# Patient Record
Sex: Female | Born: 1955 | Race: Black or African American | Hispanic: No | Marital: Single | Smoking: Former smoker
Health system: Southern US, Community
[De-identification: ages and names within clinical notes are randomized; demographics above are authoritative.]

## PROBLEM LIST (undated history)

## (undated) DIAGNOSIS — D649 Anemia, unspecified: Secondary | ICD-10-CM

## (undated) DIAGNOSIS — F419 Anxiety disorder, unspecified: Secondary | ICD-10-CM

## (undated) DIAGNOSIS — K7460 Unspecified cirrhosis of liver: Secondary | ICD-10-CM

## (undated) DIAGNOSIS — C801 Malignant (primary) neoplasm, unspecified: Secondary | ICD-10-CM

## (undated) DIAGNOSIS — N183 Chronic kidney disease, stage 3 unspecified: Secondary | ICD-10-CM

## (undated) DIAGNOSIS — C50919 Malignant neoplasm of unspecified site of unspecified female breast: Secondary | ICD-10-CM

## (undated) DIAGNOSIS — J189 Pneumonia, unspecified organism: Secondary | ICD-10-CM

## (undated) DIAGNOSIS — K219 Gastro-esophageal reflux disease without esophagitis: Secondary | ICD-10-CM

## (undated) DIAGNOSIS — R188 Other ascites: Secondary | ICD-10-CM

## (undated) DIAGNOSIS — R519 Headache, unspecified: Secondary | ICD-10-CM

## (undated) DIAGNOSIS — I10 Essential (primary) hypertension: Secondary | ICD-10-CM

## (undated) DIAGNOSIS — I959 Hypotension, unspecified: Secondary | ICD-10-CM

## (undated) DIAGNOSIS — R0600 Dyspnea, unspecified: Secondary | ICD-10-CM

## (undated) DIAGNOSIS — Z7901 Long term (current) use of anticoagulants: Secondary | ICD-10-CM

## (undated) DIAGNOSIS — K766 Portal hypertension: Secondary | ICD-10-CM

## (undated) HISTORY — PX: LYMPH NODE DISSECTION: SHX5087

## (undated) HISTORY — PX: BREAST SURGERY: SHX581

## (undated) HISTORY — PX: BREAST LUMPECTOMY: SHX2

## (undated) HISTORY — PX: ABDOMINAL HYSTERECTOMY: SHX81

---

## 2016-08-07 ENCOUNTER — Observation Stay (HOSPITAL_COMMUNITY)
Admission: EM | Admit: 2016-08-07 | Discharge: 2016-08-08 | Disposition: A | Discharge status: Home / Self Care | Payer: Self-pay | Attending: Internal Medicine | Admitting: Internal Medicine

## 2016-08-07 ENCOUNTER — Encounter (HOSPITAL_COMMUNITY): Payer: Self-pay

## 2016-08-07 DIAGNOSIS — E876 Hypokalemia: Secondary | ICD-10-CM

## 2016-08-07 DIAGNOSIS — K766 Portal hypertension: Secondary | ICD-10-CM

## 2016-08-07 DIAGNOSIS — N183 Chronic kidney disease, stage 3 unspecified: Secondary | ICD-10-CM

## 2016-08-07 DIAGNOSIS — I129 Hypertensive chronic kidney disease with stage 1 through stage 4 chronic kidney disease, or unspecified chronic kidney disease: Secondary | ICD-10-CM | POA: Insufficient documentation

## 2016-08-07 DIAGNOSIS — D631 Anemia in chronic kidney disease: Secondary | ICD-10-CM

## 2016-08-07 DIAGNOSIS — K219 Gastro-esophageal reflux disease without esophagitis: Secondary | ICD-10-CM

## 2016-08-07 DIAGNOSIS — Z79899 Other long term (current) drug therapy: Secondary | ICD-10-CM | POA: Insufficient documentation

## 2016-08-07 DIAGNOSIS — I10 Essential (primary) hypertension: Secondary | ICD-10-CM

## 2016-08-07 DIAGNOSIS — K7031 Alcoholic cirrhosis of liver with ascites: Principal | ICD-10-CM

## 2016-08-07 DIAGNOSIS — Z7901 Long term (current) use of anticoagulants: Secondary | ICD-10-CM | POA: Insufficient documentation

## 2016-08-07 DIAGNOSIS — D638 Anemia in other chronic diseases classified elsewhere: Secondary | ICD-10-CM | POA: Insufficient documentation

## 2016-08-07 DIAGNOSIS — R188 Other ascites: Secondary | ICD-10-CM | POA: Diagnosis present

## 2016-08-07 HISTORY — DX: Unspecified cirrhosis of liver: K74.60

## 2016-08-07 HISTORY — DX: Long term (current) use of anticoagulants: Z79.01

## 2016-08-07 HISTORY — DX: Hypotension, unspecified: I95.9

## 2016-08-07 HISTORY — DX: Malignant neoplasm of unspecified site of unspecified female breast: C50.919

## 2016-08-07 HISTORY — DX: Other ascites: R18.8

## 2016-08-07 HISTORY — DX: Malignant (primary) neoplasm, unspecified: C80.1

## 2016-08-07 HISTORY — DX: Essential (primary) hypertension: I10

## 2016-08-07 LAB — CBC WITH DIFFERENTIAL/PLATELET
Basophils Absolute: 0 10*3/uL (ref 0.0–0.1)
Basophils Relative: 1 %
Eosinophils Absolute: 0.3 10*3/uL (ref 0.0–0.7)
Eosinophils Relative: 6 %
HEMATOCRIT: 29.7 % — AB (ref 36.0–46.0)
Hemoglobin: 9.5 g/dL — ABNORMAL LOW (ref 12.0–15.0)
LYMPHS PCT: 35 %
Lymphs Abs: 1.6 10*3/uL (ref 0.7–4.0)
MCH: 24.1 pg — ABNORMAL LOW (ref 26.0–34.0)
MCHC: 32 g/dL (ref 30.0–36.0)
MCV: 75.4 fL — ABNORMAL LOW (ref 78.0–100.0)
MONO ABS: 0.4 10*3/uL (ref 0.1–1.0)
MONOS PCT: 8 %
NEUTROS ABS: 2.4 10*3/uL (ref 1.7–7.7)
Neutrophils Relative %: 50 %
Platelets: 159 10*3/uL (ref 150–400)
RBC: 3.94 MIL/uL (ref 3.87–5.11)
RDW: 15.6 % — AB (ref 11.5–15.5)
WBC: 4.7 10*3/uL (ref 4.0–10.5)

## 2016-08-07 LAB — COMPREHENSIVE METABOLIC PANEL
ALT: 17 U/L (ref 14–54)
AST: 26 U/L (ref 15–41)
Albumin: 2.9 g/dL — ABNORMAL LOW (ref 3.5–5.0)
Alkaline Phosphatase: 139 U/L — ABNORMAL HIGH (ref 38–126)
Anion gap: 7 (ref 5–15)
BUN: 36 mg/dL — ABNORMAL HIGH (ref 6–20)
CO2: 21 mmol/L — AB (ref 22–32)
Calcium: 8.6 mg/dL — ABNORMAL LOW (ref 8.9–10.3)
Chloride: 109 mmol/L (ref 101–111)
Creatinine, Ser: 2.07 mg/dL — ABNORMAL HIGH (ref 0.44–1.00)
GFR calc Af Amer: 29 mL/min — ABNORMAL LOW (ref >60)
GFR calc non Af Amer: 25 mL/min — ABNORMAL LOW (ref >60)
Glucose, Bld: 90 mg/dL (ref 65–99)
POTASSIUM: 3.4 mmol/L — AB (ref 3.5–5.1)
SODIUM: 137 mmol/L (ref 135–145)
Total Bilirubin: 0.3 mg/dL (ref 0.3–1.2)
Total Protein: 7 g/dL (ref 6.5–8.1)

## 2016-08-07 LAB — PROTIME-INR
INR: 1.09
Prothrombin Time: 14.1 seconds (ref 11.4–15.2)

## 2016-08-07 MED ORDER — POTASSIUM CHLORIDE CRYS ER 20 MEQ PO TBCR
40.0000 meq | EXTENDED_RELEASE_TABLET | Freq: Once | ORAL | Status: AC
  Administered 2016-08-07: 40 meq via ORAL
  Filled 2016-08-07: qty 2

## 2016-08-07 NOTE — ED Notes (Signed)
Pt ambulated to the restroom with out difficulty.

## 2016-08-07 NOTE — ED Triage Notes (Signed)
Patient states she has ascites from cirrhosis and needs to be drained. Patient is new to [place].

## 2016-08-07 NOTE — ED Provider Notes (Signed)
[facility] DEPT Provider Note   CSN: [id number] Arrival date & time: 08/07/16  1728     History   Chief Complaint Chief Complaint  Patient presents with  . abdominal swelling    HPI Katrinia Straker is a 61 y.o. female.  HPI Complains of abdominal swelling for the past 2 weeks. She states this abdominal swelling secondary to ascites. She admits to shortness of breath over the past 2-3 days, dyspnea is worse with exertion and improved with rest. No fever. Denies noncompliance with medications. No other associated symptoms. Past Medical History:  Diagnosis Date  . Ascites   . Cancer ([place])   . Cirrhosis ([place])   . Hypertension   . Hypotension     There are no active problems to display for this patient.   Past Surgical History:  Procedure Laterality Date  . ABDOMINAL HYSTERECTOMY    . BREAST SURGERY    . CESAREAN SECTION      OB History    No data available       Home Medications    Prior to Admission medications   Medication Sig Start Date End Date Taking? Authorizing Provider  amLODipine (NORVASC) 10 MG tablet Take 10 mg by mouth daily.   Yes Historical Provider, MD  apixaban (ELIQUIS) 5 MG TABS tablet Take 5 mg by mouth 2 (two) times daily.   Yes Historical Provider, MD  furosemide (LASIX) 40 MG tablet Take 40 mg by mouth daily.   Yes Historical Provider, MD  hydrALAZINE (APRESOLINE) 25 MG tablet Take 25 mg by mouth 2 (two) times daily.   Yes Historical Provider, MD  hydrOXYzine (ATARAX/VISTARIL) 25 MG tablet Take 25 mg by mouth 2 (two) times daily as needed for itching.   Yes Historical Provider, MD  magnesium oxide (MAG-OX) 400 MG tablet Take 400 mg by mouth daily.   Yes Historical Provider, MD  omeprazole (PRILOSEC) 40 MG capsule Take 40 mg by mouth daily.   Yes Historical Provider, MD  propranolol (INDERAL) 20 MG tablet Take 20 mg by mouth 3 (three) times daily.   Yes Historical Provider, MD  spironolactone (ALDACTONE) 25 MG tablet Take 25 mg by mouth  daily.   Yes Historical Provider, MD    Family History No family history on file.  Social History Social History  Substance Use Topics  . Smoking status: Never Smoker  . Smokeless tobacco: Never Used  . Alcohol use No     Comment: for alcohol abuse     Allergies   Compazine [prochlorperazine edisylate] and Meclizine   Review of Systems Review of Systems  Constitutional: Negative.   HENT: Negative.   Respiratory: Positive for shortness of breath.   Cardiovascular: Negative.   Gastrointestinal: Positive for abdominal distention.  Musculoskeletal: Negative.   Skin: Negative.   Neurological: Negative.   Psychiatric/Behavioral: Negative.   All other systems reviewed and are negative.    Physical Exam Updated Vital Signs BP (!) 157/100 (BP Location: Left Arm)   Pulse 91   Temp 98.5 F (36.9 C) (Oral)   Resp 18   Ht 5' (1.524 m)   Wt 140 lb (63.5 kg)   SpO2 100%   BMI 27.34 kg/m   Physical Exam  Constitutional: She appears well-developed and well-nourished. No distress.  HENT:  Head: Normocephalic and atraumatic.  Eyes: Conjunctivae are normal. Pupils are equal, round, and reactive to light.  Neck: Neck supple. No tracheal deviation present. No thyromegaly present.  Cardiovascular: Normal rate and regular rhythm.  No murmur heard. Pulmonary/Chest: Effort normal and breath sounds normal.  Abdominal: Soft. Bowel sounds are normal. She exhibits distension. There is no tenderness.  Protuberant abdomen  Musculoskeletal: Normal range of motion. She exhibits no edema or tenderness.  Neurological: She is alert. Coordination normal.  Skin: Skin is warm and dry. No rash noted.  Psychiatric: She has a normal mood and affect.  Nursing note and vitals reviewed.    ED Treatments / Results  Labs (all labs ordered are listed, but only abnormal results are displayed) Labs Reviewed  COMPREHENSIVE METABOLIC PANEL  CBC WITH DIFFERENTIAL/PLATELET  PROTIME-INR    EKG   EKG Interpretation None      Results for orders placed or performed during the hospital encounter of 08/07/16  Comprehensive metabolic panel  Result Value Ref Range   Sodium 137 135 - 145 mmol/L   Potassium 3.4 (L) 3.5 - 5.1 mmol/L   Chloride 109 101 - 111 mmol/L   CO2 21 (L) 22 - 32 mmol/L   Glucose, Bld 90 65 - 99 mg/dL   BUN 36 (H) 6 - 20 mg/dL   Creatinine, Ser 2.07 (H) 0.44 - 1.00 mg/dL   Calcium 8.6 (L) 8.9 - 10.3 mg/dL   Total Protein 7.0 6.5 - 8.1 g/dL   Albumin 2.9 (L) 3.5 - 5.0 g/dL   AST 26 15 - 41 U/L   ALT 17 14 - 54 U/L   Alkaline Phosphatase 139 (H) 38 - 126 U/L   Total Bilirubin 0.3 0.3 - 1.2 mg/dL   GFR calc non Af Amer 25 (L) >60 mL/min   GFR calc Af Amer 29 (L) >60 mL/min   Anion gap 7 5 - 15  CBC with Differential/Platelet  Result Value Ref Range   WBC 4.7 4.0 - 10.5 K/uL   RBC 3.94 3.87 - 5.11 MIL/uL   Hemoglobin 9.5 (L) 12.0 - 15.0 g/dL   HCT 29.7 (L) 36.0 - 46.0 %   MCV 75.4 (L) 78.0 - 100.0 fL   MCH 24.1 (L) 26.0 - 34.0 pg   MCHC 32.0 30.0 - 36.0 g/dL   RDW 15.6 (H) 11.5 - 15.5 %   Platelets 159 150 - 400 K/uL   Neutrophils Relative % 50 %   Neutro Abs 2.4 1.7 - 7.7 K/uL   Lymphocytes Relative 35 %   Lymphs Abs 1.6 0.7 - 4.0 K/uL   Monocytes Relative 8 %   Monocytes Absolute 0.4 0.1 - 1.0 K/uL   Eosinophils Relative 6 %   Eosinophils Absolute 0.3 0.0 - 0.7 K/uL   Basophils Relative 1 %   Basophils Absolute 0.0 0.0 - 0.1 K/uL  Protime-INR  Result Value Ref Range   Prothrombin Time 14.1 11.4 - 15.2 seconds   INR 1.09    No results found.  Radiology No results found.  Procedures Procedures (including critical care time)  Medications Ordered in ED Medications - No data to display   Initial Impression / Assessment and Plan / ED Course  I have reviewed the triage vital signs and the nursing notes.  Pertinent labs & imaging results that were available during my care of the patient were reviewed by me and considered in my medical  decision making (see chart for details).     Dr. Eulas Post consulted and will arrange for overnight stay. Patient likely needs therapeutic paracentesis Oral potassium supplementation ordered Final Clinical Impressions(s) / ED Diagnoses  Diagnosis #1 ascites due to alcoholic cirrhosis #2 renal insufficiency Final diagnoses:  None  #  3 hypokalemia #4 dyspnea New Prescriptions New Prescriptions   No medications on file     Orlie Dakin, MD 08/07/16 2124

## 2016-08-07 NOTE — H&P (Signed)
History and Physical    Alicia [name] CWC:[id number] DOB: 1955-07-24 DOA: 08/07/2016  PCP: No PCP Per Patient  Previously followed at [facility]  Patient coming from: Home  Chief Complaint: Requesting therapeutic paracentesis.  HPI: Alicia [name] is a 61 y.o. woman with a history of alcoholic cirrhosis (diagnosed in 2017), recurrent ascites requiring therapeutic paracentesis, HTN, CKD 3, and a remote history of breast cancer who presents to the ED for paracentesis.  She has marked abdominal distention, now causing DOE and abdominal pain.  No fever.  No chest pain.  No syncope.  Stools have been loose.  No LUTS.  Last paracentesis on April 14th.  She reports that the interval between taps is decreasing.  She typically gets albumin with her large volume paracentesis.  She had 8L removed the last time.  She stopped taking her Eliquis on Saturday.  She reports that she was started on Eliquis recently because she is "high risk" for DVT.  She has a bilateral lower extremity ultrasound report with her that states that both legs were negative for DVT when checked.  She has several medical records at the bedside for MD review.  She has reference labs from 2/20 and 3/30.  Baseline Hgb around 10.  Creatinine has been 1.7 - 2.2.  ED Course: Normal WBC count.  Hgb 9.5.  Platelet count 159.  Potassium 3.1.  BUN 36.  Creatinine 2.07.  INR 1.  Hospitalist asked to place in observation for therapeutic paracentesis in the AM.  Review of Systems: As per HPI otherwise 10 systems reviewed and negative.   Past Medical History:  Diagnosis Date  . Ascites   . Breast cancer ([place])   . Cancer ([place])   . Chronic anticoagulation   . Cirrhosis ([place])   . Hypertension   . Hypotension     Past Surgical History:  Procedure Laterality Date  . ABDOMINAL HYSTERECTOMY    . BREAST LUMPECTOMY    . BREAST SURGERY    . CESAREAN SECTION    . LYMPH NODE DISSECTION       reports that she  has never smoked. She has never used smokeless tobacco. She reports that she does not drink alcohol or use drugs.  Allergies  Allergen Reactions  . Compazine [Prochlorperazine Edisylate] Anxiety  . Meclizine Anxiety    Family History  Problem Relation Age of Onset  . Diabetes Mother   . Diabetes Father   . Diabetes Brother      Prior to Admission medications   Medication Sig Start Date End Date Taking? Authorizing Provider  amLODipine (NORVASC) 10 MG tablet Take 10 mg by mouth daily.   Yes Historical Provider, MD  apixaban (ELIQUIS) 5 MG TABS tablet Take 5 mg by mouth 2 (two) times daily.   Yes Historical Provider, MD  furosemide (LASIX) 40 MG tablet Take 40 mg by mouth daily.   Yes Historical Provider, MD  hydrALAZINE (APRESOLINE) 25 MG tablet Take 25 mg by mouth 2 (two) times daily.   Yes Historical Provider, MD  hydrOXYzine (ATARAX/VISTARIL) 25 MG tablet Take 25 mg by mouth 2 (two) times daily as needed for itching.   Yes Historical Provider, MD  magnesium oxide (MAG-OX) 400 MG tablet Take 400 mg by mouth daily.   Yes Historical Provider, MD  omeprazole (PRILOSEC) 40 MG capsule Take 40 mg by mouth daily.   Yes Historical Provider, MD  propranolol (INDERAL) 20 MG tablet Take 20 mg by mouth 3 (three) times daily.  Yes Historical Provider, MD  spironolactone (ALDACTONE) 25 MG tablet Take 25 mg by mouth daily.   Yes Historical Provider, MD    Physical Exam: Vitals:   08/07/16 1744 08/07/16 1754 08/07/16 2113  BP: (!) 157/100  (!) 137/96  Pulse: 91  89  Resp: 18  18  Temp: 98.5 F (36.9 C)  97 F (36.1 C)  TempSrc: Oral  Oral  SpO2: 100%  100%  Weight:  63.5 kg (140 lb)   Height:  5' (1.524 m)       Constitutional: NAD, calm, comfortable, NONtoxic appearing Vitals:   08/07/16 1744 08/07/16 1754 08/07/16 2113  BP: (!) 157/100  (!) 137/96  Pulse: 91  89  Resp: 18  18  Temp: 98.5 F (36.9 C)  97 F (36.1 C)  TempSrc: Oral  Oral  SpO2: 100%  100%  Weight:  63.5 kg  (140 lb)   Height:  5' (1.524 m)    Eyes: PERRL, lids and conjunctivae normal ENMT: Mucous membranes are moist. Posterior pharynx clear of any exudate or lesions. Normal dentition.  Neck: normal appearance, supple, no masses Respiratory: clear to auscultation bilaterally, no wheezing, no crackles. Normal respiratory effort. No accessory muscle use.  Cardiovascular: Normal rate, regular rhythm, no murmurs / rubs / gallops. No extremity edema. 2+ pedal pulses. No carotid bruits.  GI: abdomen is profoundly distended.  No tenderness.  Hypoactive bowel sounds.  + ventral hernia.   Musculoskeletal:  No joint deformity in upper and lower extremities. Good ROM, no contractures. Normal muscle tone.  Skin: no rashes, warm and dry Neurologic: CN 2-12 grossly intact. Sensation intact, Strength symmetric bilaterally. Psychiatric: Normal judgment and insight. Alert and oriented x 3. Normal mood.     Labs on Admission: I have personally reviewed following labs and imaging studies  CBC:  Recent Labs Lab 08/07/16 1958  WBC 4.7  NEUTROABS 2.4  HGB 9.5*  HCT 29.7*  MCV 75.4*  PLT 956   Basic Metabolic Panel:  Recent Labs Lab 08/07/16 1958  NA 137  K 3.4*  CL 109  CO2 21*  GLUCOSE 90  BUN 36*  CREATININE 2.07*  CALCIUM 8.6*   GFR: Estimated Creatinine Clearance: 24.3 mL/min (A) (by C-G formula based on SCr of 2.07 mg/dL (H)). Liver Function Tests:  Recent Labs Lab 08/07/16 1958  AST 26  ALT 17  ALKPHOS 139*  BILITOT 0.3  PROT 7.0  ALBUMIN 2.9*   Coagulation Profile:  Recent Labs Lab 08/07/16 1958  INR 1.09     Assessment/Plan Principal Problem:   Ascites Active Problems:   Alcoholic cirrhosis of liver with ascites (HCC)   HTN (hypertension)   Anemia in other chronic diseases classified elsewhere   CKD (chronic kidney disease) stage 3, GFR 30-59 ml/min   Hypokalemia      Alcoholic cirrhosis with recurrent ascites --Will need US guided paracentesis with IR  in the AM --Will need albumin infusion ordered in the AM --Will need labs for peritoneal fluid ordered, if needed.  No signs of symptoms of SBP.  She is not encephalopathic or acutely decompensating --Holding Eliquis since Saturday. --Continue lasix, spironolactone, propranolol  HTN --Amlodipine, hydralazine  CKD --Appears stable  Hypokalemia --Replacement ordered in the ED  Social --Will need case manager assistance with discharge planning.  Needs referral to local physicians.  She will have an ongoing need for therapeutic paracentesis. --She says that she is applying for disability.   DVT prophylaxis: Low risk, outpatient status. Code Status: FULL  Family Communication: Patient alone in the ED at time of admission. Disposition Plan: Expect she will discharge home tomorrow. Consults called: NONE Admission status: Place in observation, med surg bed.   TIME SPENT: 60 minutes   Eber Jones MD [facility] Pager [phone]  If 7PM-7AM, please contact night-coverage [email or web address] Password [email or web address]  08/07/2016, 11:46 PM

## 2016-08-08 ENCOUNTER — Observation Stay (HOSPITAL_COMMUNITY): Payer: Self-pay

## 2016-08-08 ENCOUNTER — Encounter (HOSPITAL_COMMUNITY): Payer: Self-pay | Admitting: Internal Medicine

## 2016-08-08 DIAGNOSIS — K766 Portal hypertension: Secondary | ICD-10-CM

## 2016-08-08 DIAGNOSIS — I10 Essential (primary) hypertension: Secondary | ICD-10-CM

## 2016-08-08 DIAGNOSIS — N183 Chronic kidney disease, stage 3 unspecified: Secondary | ICD-10-CM

## 2016-08-08 DIAGNOSIS — K7031 Alcoholic cirrhosis of liver with ascites: Secondary | ICD-10-CM

## 2016-08-08 DIAGNOSIS — K219 Gastro-esophageal reflux disease without esophagitis: Secondary | ICD-10-CM

## 2016-08-08 DIAGNOSIS — E876 Hypokalemia: Secondary | ICD-10-CM

## 2016-08-08 DIAGNOSIS — D631 Anemia in chronic kidney disease: Secondary | ICD-10-CM

## 2016-08-08 DIAGNOSIS — D638 Anemia in other chronic diseases classified elsewhere: Secondary | ICD-10-CM

## 2016-08-08 MED ORDER — OMEPRAZOLE 40 MG PO CPDR: 40.0000 mg | DELAYED_RELEASE_CAPSULE | Freq: Every day | ORAL | 1 refills | Status: DC

## 2016-08-08 MED ORDER — FUROSEMIDE 40 MG PO TABS: 40.0000 mg | ORAL_TABLET | Freq: Every day | ORAL | 1 refills | Status: DC

## 2016-08-08 MED ORDER — HYDRALAZINE HCL 25 MG PO TABS
25.0000 mg | ORAL_TABLET | Freq: Two times a day (BID) | ORAL | Status: DC
  Administered 2016-08-08 (×2): 25 mg via ORAL
  Filled 2016-08-08 (×2): qty 1

## 2016-08-08 MED ORDER — FUROSEMIDE 40 MG PO TABS
40.0000 mg | ORAL_TABLET | Freq: Every day | ORAL | Status: DC
  Administered 2016-08-08: 40 mg via ORAL
  Filled 2016-08-08: qty 1
  Filled 2016-08-08: qty 2

## 2016-08-08 MED ORDER — AMLODIPINE BESYLATE 5 MG PO TABS
5.0000 mg | ORAL_TABLET | Freq: Every day | ORAL | Status: DC
  Administered 2016-08-08: 5 mg via ORAL
  Filled 2016-08-08: qty 1

## 2016-08-08 MED ORDER — ALBUMIN HUMAN 25 % IV SOLN
50.0000 g | INTRAVENOUS | Status: AC
  Administered 2016-08-08 (×2): 50 g via INTRAVENOUS
  Filled 2016-08-08 (×3): qty 200

## 2016-08-08 MED ORDER — HYDRALAZINE HCL 25 MG PO TABS: 25.0000 mg | ORAL_TABLET | Freq: Two times a day (BID) | ORAL | 1 refills | Status: DC

## 2016-08-08 MED ORDER — HYDROXYZINE HCL 25 MG PO TABS
25.0000 mg | ORAL_TABLET | Freq: Two times a day (BID) | ORAL | Status: DC | PRN
  Administered 2016-08-08 (×2): 25 mg via ORAL
  Filled 2016-08-08 (×2): qty 1

## 2016-08-08 MED ORDER — AMLODIPINE BESYLATE 5 MG PO TABS
5.0000 mg | ORAL_TABLET | Freq: Every day | ORAL | 1 refills | Status: DC
Start: 2016-08-09 — End: 2016-08-08

## 2016-08-08 MED ORDER — AMLODIPINE BESYLATE 5 MG PO TABS: 5.0000 mg | ORAL_TABLET | Freq: Every day | ORAL | 1 refills | Status: DC

## 2016-08-08 MED ORDER — SPIRONOLACTONE 25 MG PO TABS: 75.0000 mg | ORAL_TABLET | Freq: Every day | ORAL | 1 refills | Status: DC

## 2016-08-08 MED ORDER — ONDANSETRON HCL 4 MG/2ML IJ SOLN: 4.0000 mg | Freq: Four times a day (QID) | INTRAMUSCULAR | Status: DC | PRN

## 2016-08-08 MED ORDER — MAGNESIUM OXIDE 400 (241.3 MG) MG PO TABS
400.0000 mg | ORAL_TABLET | Freq: Every day | ORAL | Status: DC
Start: 2016-08-08 — End: 2016-08-09
  Administered 2016-08-08: 400 mg via ORAL
  Filled 2016-08-08: qty 1

## 2016-08-08 MED ORDER — SPIRONOLACTONE 25 MG PO TABS
75.0000 mg | ORAL_TABLET | Freq: Every day | ORAL | Status: DC
  Administered 2016-08-08: 75 mg via ORAL
  Filled 2016-08-08: qty 3

## 2016-08-08 MED ORDER — AMLODIPINE BESYLATE 5 MG PO TABS: 10.0000 mg | ORAL_TABLET | Freq: Every day | ORAL | Status: DC

## 2016-08-08 MED ORDER — SPIRONOLACTONE 25 MG PO TABS: 25.0000 mg | ORAL_TABLET | Freq: Every day | ORAL | Status: DC

## 2016-08-08 MED ORDER — PROPRANOLOL HCL 20 MG PO TABS
20.0000 mg | ORAL_TABLET | Freq: Three times a day (TID) | ORAL | Status: DC
  Administered 2016-08-08 (×2): 20 mg via ORAL
  Filled 2016-08-08 (×3): qty 1

## 2016-08-08 MED ORDER — ONDANSETRON HCL 4 MG PO TABS: 4.0000 mg | ORAL_TABLET | Freq: Four times a day (QID) | ORAL | Status: DC | PRN

## 2016-08-08 MED ORDER — TRAMADOL HCL 50 MG PO TABS
50.0000 mg | ORAL_TABLET | Freq: Three times a day (TID) | ORAL | Status: DC | PRN
  Administered 2016-08-08: 50 mg via ORAL
  Filled 2016-08-08: qty 1

## 2016-08-08 MED ORDER — PANTOPRAZOLE SODIUM 40 MG PO TBEC
40.0000 mg | DELAYED_RELEASE_TABLET | Freq: Every day | ORAL | Status: DC
  Administered 2016-08-08: 40 mg via ORAL
  Filled 2016-08-08: qty 1

## 2016-08-08 NOTE — Care Management Note (Signed)
Case Management Note  Patient Details  Name: Masey Scheiber MRN: [id number] Date of Birth: 12/16/1955  Subjective/Objective: 61 y/o f admitted w/ascites. From home. CM referral-Provided patient w/pcp listing-encouraged [facility]-patient will call on own for pcp appt,community resources-$4 [facility] med list, health insurance resource. Patient voiced understanding.                   Action/Plan:d/c plan home.   Expected Discharge Date:                  Expected Discharge Plan:  Home/Self Care  In-House Referral:     Discharge planning Services  CM Consult  Post Acute Care Choice:    Choice offered to:     DME Arranged:    DME Agency:     HH Arranged:    HH Agency:     Status of Service:  In process, will continue to follow  If discussed at Long Length of Stay Meetings, dates discussed:    Additional Comments:  Dessa Phi, RN 08/08/2016, 11:19 AM

## 2016-08-08 NOTE — Progress Notes (Signed)
08/08/16  1900  Reviewed discharge instructions with patient. Patient verbalized understanding of discharge instructions. Copy of discharge instructions and prescriptions given to patient.

## 2016-08-08 NOTE — Discharge Summary (Signed)
Physician Discharge Summary  Alicia [name] XBM:[id number] DOB: [birth year] DOA: 08/07/2016  PCP: No PCP Per Patient  Admit date: 08/07/2016 Discharge date: 08/08/2016  Time spent: 35 minutes  Recommendations for Outpatient Follow-up:  1. Repeat CBC to follow Hgb trend 2. Repeat BMET to follow electrolytes and renal function  3. Follow BP and adjust antihypertensive regimen as needed    Discharge Diagnoses:  Principal Problem:   Ascites Active Problems:   Alcoholic cirrhosis of liver with ascites (HCC)   HTN (hypertension)   Anemia in other chronic diseases classified elsewhere   CKD (chronic kidney disease) stage 3, GFR 30-59 ml/min   Hypokalemia   Gastroesophageal reflux disease   Portal hypertension ([place])   Discharge Condition: stable and improved. CM has arranged follow up with transition clinic and [place] office for Gastroenterology will set up outpatient appointment for her.  Diet recommendation: low sodium diet   Filed Weights   08/07/16 1754 08/08/16 0158  Weight: 63.5 kg (140 lb) 68 kg (150 lb)    History of present illness:  As per H&P written by Dr. Eulas Post on 08/07/16 61 y.o. woman with a history of alcoholic cirrhosis (diagnosed in 2017), recurrent ascites requiring therapeutic paracentesis, HTN, CKD 3, and a remote history of breast cancer who presents to the ED for paracentesis.  She has marked abdominal distention, now causing DOE and abdominal pain.  No fever.  No chest pain.  No syncope.  Stools have been loose.  No LUTS.  Last paracentesis on April 14th.  She reports that the interval between taps is decreasing.  She typically gets albumin with her large volume paracentesis.  She had 8L removed the last time.  She stopped taking her Eliquis on Saturday.  She reports that she was started on Eliquis recently because she is "high risk" for DVT.  She has a bilateral lower extremity ultrasound report with her that states that both legs were negative for DVT when  checked.  She has several medical records at the bedside for MD review.  She has reference labs from 2/20 and 3/30.  Baseline Hgb around 10.  Creatinine has been 1.7 - 2.2.  Hospital Course:  1-alcoholic cirrhosis with recurrent ascites -s/p US guided paracentesis, completely therapeutic in intention, 7.9 L removed. -no complications appreciated,  -no fever, no abd pain and because of that not fluid analysis to r/o SBP was pursuit. -patient advise to follow low sodium diet  -will discharge on lasix, spironolactone (dose adjusted) and propanolol -will also continue PPI   2-essential HTN/portal HTN -will continue amlodipine, hydralazine, propanolol, lasix and spironolactone  -advise to follow low sodium diet   3-CKD: stage 3 at baseline -Cr stable -will recommend BMET at follow up to assess electrolytes and renal function   4-Hypokalemia -repleted  -will continue magnesium supplementation and her spironolactone dose has been adjusted   5-GERD -will continue PPI  6-chronic anemia  -No signs of acute bleeding  -Hgb has remained stable -will recommend CBC at follow up to monitor trend  Procedures:  Large volume paracentesis: 08/08/16 7.9 L removed, no complications.   Consultations:  GI (curbside, to set up outpatient follow up and received rec's about diuretic regimen)  Discharge Exam: Vitals:   08/08/16 1029 08/08/16 1400  BP: (!) 120/7 112/80  Pulse:  72  Resp:  18  Temp:  98.6 F (37 C)    General: afebrile, in no major distress. Patient denies CP and fever/chills. Patient with slight SOB and mild abdominal discomfort due  to abd distension. Cardiovascular: S1 and S2, no rubs, no gallops Respiratory: good air movement, no wheezing, no crackles. Abd: distended, with positive wave fluid, distant BS, no guarding. Extremities: no edema, no cyanosis , no clubbing   Discharge Instructions   Discharge Instructions    Diet - low sodium heart healthy    Complete by:   As directed    Discharge instructions    Complete by:  As directed    Take medications as prescribed  Please follow up with GI as instructed (office will contact you with appointment details) Be compliant with visit at [facility] for hospital follow up and establishing care with PCP Follow low sodium diet  Please maintain adequate hydration     Current Discharge Medication List    CONTINUE these medications which have CHANGED   Details  amLODipine (NORVASC) 5 MG tablet Take 1 tablet (5 mg total) by mouth daily. Qty: 30 tablet, Refills: 1    furosemide (LASIX) 40 MG tablet Take 1 tablet (40 mg total) by mouth daily. Qty: 30 tablet, Refills: 1    hydrALAZINE (APRESOLINE) 25 MG tablet Take 1 tablet (25 mg total) by mouth 2 (two) times daily. Qty: 60 tablet, Refills: 1    omeprazole (PRILOSEC) 40 MG capsule Take 1 capsule (40 mg total) by mouth daily. Qty: 30 capsule, Refills: 1    spironolactone (ALDACTONE) 25 MG tablet Take 3 tablets (75 mg total) by mouth daily. Qty: 90 tablet, Refills: 1      CONTINUE these medications which have NOT CHANGED   Details  hydrOXYzine (ATARAX/VISTARIL) 25 MG tablet Take 25 mg by mouth 2 (two) times daily as needed for itching.    magnesium oxide (MAG-OX) 400 MG tablet Take 400 mg by mouth daily.    propranolol (INDERAL) 20 MG tablet Take 20 mg by mouth 3 (three) times daily.      STOP taking these medications     apixaban (ELIQUIS) 5 MG TABS tablet        Allergies  Allergen Reactions  . Compazine [Prochlorperazine Edisylate] Anxiety  . Meclizine Anxiety   Follow-up Information    [facility], [place] Follow up.   Specialty:  Gastroenterology Why:  office will contcat you with appointment details. Contact information: [address] [place] [address] [phone]           The results of significant diagnostics from this hospitalization (including imaging, microbiology, ancillary and laboratory) are  listed below for reference.    Significant Diagnostic Studies: US Paracentesis  Result Date: 08/08/2016 INDICATION: Cirrhosis, recurrent ascites, chronic kidney disease, remote breast cancer. Request made for therapeutic paracentesis up to 8 liters. EXAM: ULTRASOUND GUIDED THERAPEUTIC PARACENTESIS MEDICATIONS: None. COMPLICATIONS: None immediate. PROCEDURE: Informed written consent was obtained from the patient after a discussion of the risks, benefits and alternatives to treatment. A timeout was performed prior to the initiation of the procedure. Initial ultrasound scanning demonstrates a large amount of ascites within the right lower abdominal quadrant. The right lower abdomen was prepped and draped in the usual sterile fashion. 1% lidocaine was used for local anesthesia. Following this, a Yueh catheter was introduced. An ultrasound image was saved for documentation purposes. The paracentesis was performed. The catheter was removed and a dressing was applied. The patient tolerated the procedure well without immediate post procedural complication. FINDINGS: A total of approximately 7.9 liters of yellow fluid was removed. IMPRESSION: Successful ultrasound-guided therapeutic paracentesis yielding 7.9 liters of peritoneal fluid. Read by:  Rowe Robert, PA-C Electronically Signed   By: Sandi Mariscal M.D.   On: 08/08/2016 11:46   Labs: Basic Metabolic Panel:  Recent Labs Lab 08/07/16 1958  NA 137  K 3.4*  CL 109  CO2 21*  GLUCOSE 90  BUN 36*  CREATININE 2.07*  CALCIUM 8.6*   Liver Function Tests:  Recent Labs Lab 08/07/16 1958  AST 26  ALT 17  ALKPHOS 139*  BILITOT 0.3  PROT 7.0  ALBUMIN 2.9*   CBC:  Recent Labs Lab 08/07/16 1958  WBC 4.7  NEUTROABS 2.4  HGB 9.5*  HCT 29.7*  MCV 75.4*  PLT 159    Signed:  Barton Dubois MD.  [facility] 08/08/2016, 4:25 PM

## 2016-08-08 NOTE — Progress Notes (Signed)
CSW consulted to assist with insurance / PCP. CSW is unable to assist with this request. RNCM has been consulted and may be able to assist with PCP. Financial counselor may be able to assist with lack of insurance.  CSW signing off.  Werner Lean LCSW [phone]

## 2016-08-08 NOTE — Procedures (Signed)
Ultrasound-guided  therapeutic paracentesis performed yielding 7.9 liters of yellow fluid. No immediate complications.

## 2016-08-16 ENCOUNTER — Ambulatory Visit: Payer: Self-pay | Admitting: Physician Assistant

## 2016-08-19 ENCOUNTER — Encounter (HOSPITAL_COMMUNITY): Payer: Self-pay | Admitting: Emergency Medicine

## 2016-08-19 DIAGNOSIS — Z7982 Long term (current) use of aspirin: Secondary | ICD-10-CM | POA: Insufficient documentation

## 2016-08-19 DIAGNOSIS — Z79899 Other long term (current) drug therapy: Secondary | ICD-10-CM | POA: Insufficient documentation

## 2016-08-19 DIAGNOSIS — N183 Chronic kidney disease, stage 3 (moderate): Secondary | ICD-10-CM | POA: Insufficient documentation

## 2016-08-19 DIAGNOSIS — K7031 Alcoholic cirrhosis of liver with ascites: Secondary | ICD-10-CM | POA: Insufficient documentation

## 2016-08-19 DIAGNOSIS — Z853 Personal history of malignant neoplasm of breast: Secondary | ICD-10-CM | POA: Insufficient documentation

## 2016-08-19 DIAGNOSIS — I129 Hypertensive chronic kidney disease with stage 1 through stage 4 chronic kidney disease, or unspecified chronic kidney disease: Secondary | ICD-10-CM | POA: Insufficient documentation

## 2016-08-19 DIAGNOSIS — M7731 Calcaneal spur, right foot: Secondary | ICD-10-CM | POA: Insufficient documentation

## 2016-08-19 NOTE — ED Triage Notes (Signed)
Patient has cirrhosis and states been having issues with ascites/fluid build up. Patient reports got fluid drained from abd on 5/3.  Patient recently moved from [place] and doesn't have PCP in area yet.  Patient also c/o swelling in right foot over the past couple days.

## 2016-08-20 ENCOUNTER — Emergency Department (HOSPITAL_COMMUNITY)
Admission: EM | Admit: 2016-08-20 | Discharge: 2016-08-20 | Disposition: A | Discharge status: Home / Self Care | Payer: Medicaid Other | Attending: Emergency Medicine | Admitting: Emergency Medicine

## 2016-08-20 ENCOUNTER — Encounter: Payer: Self-pay | Admitting: Physician Assistant

## 2016-08-20 DIAGNOSIS — M7731 Calcaneal spur, right foot: Secondary | ICD-10-CM

## 2016-08-20 DIAGNOSIS — K7031 Alcoholic cirrhosis of liver with ascites: Secondary | ICD-10-CM

## 2016-08-20 LAB — PROTIME-INR
INR: 0.99
PROTHROMBIN TIME: 13 s (ref 11.4–15.2)

## 2016-08-20 LAB — CBC WITH DIFFERENTIAL/PLATELET
BASOS PCT: 1 %
Basophils Absolute: 0 10*3/uL (ref 0.0–0.1)
EOS PCT: 6 %
Eosinophils Absolute: 0.3 10*3/uL (ref 0.0–0.7)
HEMATOCRIT: 32.9 % — AB (ref 36.0–46.0)
HEMOGLOBIN: 10.8 g/dL — AB (ref 12.0–15.0)
LYMPHS ABS: 1.7 10*3/uL (ref 0.7–4.0)
LYMPHS PCT: 31 %
MCH: 24.8 pg — AB (ref 26.0–34.0)
MCHC: 32.8 g/dL (ref 30.0–36.0)
MCV: 75.5 fL — ABNORMAL LOW (ref 78.0–100.0)
Monocytes Absolute: 0.4 10*3/uL (ref 0.1–1.0)
Monocytes Relative: 8 %
Neutro Abs: 3 10*3/uL (ref 1.7–7.7)
Neutrophils Relative %: 55 %
Platelets: 175 10*3/uL (ref 150–400)
RBC: 4.36 MIL/uL (ref 3.87–5.11)
RDW: 15.6 % — AB (ref 11.5–15.5)
WBC: 5.5 10*3/uL (ref 4.0–10.5)

## 2016-08-20 LAB — COMPREHENSIVE METABOLIC PANEL
ALBUMIN: 3.6 g/dL (ref 3.5–5.0)
ALT: 21 U/L (ref 14–54)
ANION GAP: 11 (ref 5–15)
AST: 35 U/L (ref 15–41)
Alkaline Phosphatase: 152 U/L — ABNORMAL HIGH (ref 38–126)
BILIRUBIN TOTAL: 0.6 mg/dL (ref 0.3–1.2)
BUN: 36 mg/dL — ABNORMAL HIGH (ref 6–20)
CALCIUM: 9.1 mg/dL (ref 8.9–10.3)
CO2: 21 mmol/L — ABNORMAL LOW (ref 22–32)
Chloride: 106 mmol/L (ref 101–111)
Creatinine, Ser: 2.07 mg/dL — ABNORMAL HIGH (ref 0.44–1.00)
GFR calc non Af Amer: 25 mL/min — ABNORMAL LOW (ref >60)
GFR, EST AFRICAN AMERICAN: 29 mL/min — AB (ref >60)
GLUCOSE: 100 mg/dL — AB (ref 65–99)
POTASSIUM: 3.7 mmol/L (ref 3.5–5.1)
Sodium: 138 mmol/L (ref 135–145)
TOTAL PROTEIN: 7.8 g/dL (ref 6.5–8.1)

## 2016-08-20 NOTE — ED Provider Notes (Signed)
[facility] DEPT Provider Note   CSN: [id number] Arrival date & time: 08/19/16  1846  By signing my name below, I, Alicia [name], attest that this documentation has been prepared under the direction and in the presence of Alicia Etienne, DO. Electronically Signed: [name], ED Scribe. 08/20/16. 2:50 AM.  History   Chief Complaint Chief Complaint  Patient presents with  . Ascites  . swelling in right foot   The history is provided by the patient. No language interpreter was used.   HPI Comments: Alicia [name] is a 61 y.o. female with PMHx of Ascites, who presents to the Emergency Department complaining of gradual worsening abdominal swelling onset 2 weeks ago. No treatments tried prior to arrival in the ED. Pt denies fever.  Pt has a second complaint of constant, moderate right foot pain in her heel onset yesterday. Pt states her pain is exacerbated by direct pressure and ambulation. No treatments were tried prior to arrival in the ED. No other complaints at this time.   Past Medical History:  Diagnosis Date  . Ascites   . Breast cancer ([place])   . Cancer ([place])   . Chronic anticoagulation   . Cirrhosis ([place])   . Hypertension   . Hypotension     Patient Active Problem List   Diagnosis Date Noted  . Alcoholic cirrhosis of liver with ascites ([place]) 08/08/2016  . HTN (hypertension) 08/08/2016  . Anemia in other chronic diseases classified elsewhere 08/08/2016  . CKD (chronic kidney disease) stage 3, GFR 30-59 ml/min 08/08/2016  . Hypokalemia 08/08/2016  . Gastroesophageal reflux disease   . Portal hypertension ([place])   . Ascites 08/07/2016    Past Surgical History:  Procedure Laterality Date  . ABDOMINAL HYSTERECTOMY    . BREAST LUMPECTOMY    . BREAST SURGERY    . CESAREAN SECTION    . LYMPH NODE DISSECTION      OB History    No data available       Home Medications    Prior to Admission medications   Medication Sig Start Date End Date Taking?  Authorizing Provider  acetaminophen (ACETAMINOPHEN 8 HOUR) 650 MG CR tablet Take 650 mg by mouth every 8 (eight) hours as needed for pain.   Yes [provider]  amLODipine (NORVASC) 5 MG tablet Take 1 tablet (5 mg total) by mouth daily. 08/09/16  Yes Barton Dubois, MD  aspirin-acetaminophen-caffeine (EXCEDRIN MIGRAINE) [phone] MG tablet Take 2 tablets by mouth every 6 (six) hours as needed for headache.   Yes [provider]  furosemide (LASIX) 40 MG tablet Take 1 tablet (40 mg total) by mouth daily. 08/08/16  Yes Barton Dubois, MD  hydrALAZINE (APRESOLINE) 25 MG tablet Take 1 tablet (25 mg total) by mouth 2 (two) times daily. 08/08/16  Yes Barton Dubois, MD  hydrOXYzine (ATARAX/VISTARIL) 25 MG tablet Take 25 mg by mouth 2 (two) times daily as needed for itching.   Yes [provider]  magnesium oxide (MAG-OX) 400 MG tablet Take 400 mg by mouth daily.   Yes [provider]  omeprazole (PRILOSEC) 40 MG capsule Take 1 capsule (40 mg total) by mouth daily. 08/08/16  Yes Barton Dubois, MD  propranolol (INDERAL) 20 MG tablet Take 20 mg by mouth 3 (three) times daily.   Yes [provider]  spironolactone (ALDACTONE) 25 MG tablet Take 3 tablets (75 mg total) by mouth daily. 08/09/16  Yes Barton Dubois, MD    Family History Family History  Problem Relation Age  of Onset  . Diabetes Mother   . Diabetes Father   . Diabetes Brother     Social History Social History  Substance Use Topics  . Smoking status: Never Smoker  . Smokeless tobacco: Never Used  . Alcohol use No     Comment: for alcohol abuse     Allergies   Compazine [prochlorperazine edisylate] and Meclizine   Review of Systems Review of Systems  Constitutional: Negative for chills and fever.  HENT: Negative for congestion and rhinorrhea.   Eyes: Negative for redness and visual disturbance.  Respiratory: Negative for shortness of breath and wheezing.   Cardiovascular: Negative for  chest pain and palpitations.  Gastrointestinal: Positive for abdominal distention. Negative for nausea and vomiting.  Genitourinary: Negative for dysuria and urgency.  Musculoskeletal: Positive for myalgias. Negative for arthralgias.  Skin: Negative for pallor and wound.  Neurological: Negative for dizziness and headaches.     Physical Exam Updated Vital Signs BP (!) 161/96 (BP Location: Left Arm)   Pulse 81   Temp 98.7 F (37.1 C) (Oral)   Resp 18   SpO2 100%   Physical Exam  Constitutional: She is oriented to person, place, and time. She appears well-developed and well-nourished. No distress.  HENT:  Head: Normocephalic and atraumatic.  Eyes: Pupils are equal, round, and reactive to light.  Neck: Neck supple.  Cardiovascular: Normal rate.   Pulmonary/Chest: Effort normal and breath sounds normal. She has no wheezes. She has no rales.  Abdominal: Soft. She exhibits distension. A hernia is present.  Distended abdomen with positive fluid wave.   Musculoskeletal: She exhibits edema and tenderness.  Right foot: Tenderness to the attachment of the Achilles tendon to the heel. Trace edema present.    Neurological: She is alert and oriented to person, place, and time.  Skin: Skin is warm and dry. She is not diaphoretic.  Psychiatric: She has a normal mood and affect. Her behavior is normal.  Nursing note and vitals reviewed.    ED Treatments / Results  DIAGNOSTIC STUDIES: Oxygen Saturation is 100% on RA, normal by my interpretation.   COORDINATION OF CARE: 2:41 AM-Discussed next steps with pt including following up with GI. Pt verbalized understanding and is agreeable with the plan.    Labs (all labs ordered are listed, but only abnormal results are displayed) Labs Reviewed  CBC WITH DIFFERENTIAL/PLATELET - Abnormal; Notable for the following:       Result Value   Hemoglobin 10.8 (*)    HCT 32.9 (*)    MCV 75.5 (*)    MCH 24.8 (*)    RDW 15.6 (*)    All other  components within normal limits  COMPREHENSIVE METABOLIC PANEL - Abnormal; Notable for the following:    CO2 21 (*)    Glucose, Bld 100 (*)    BUN 36 (*)    Creatinine, Ser 2.07 (*)    Alkaline Phosphatase 152 (*)    GFR calc non Af Amer 25 (*)    GFR calc Af Amer 29 (*)    All other components within normal limits  PROTIME-INR    EKG  EKG Interpretation None       Radiology No results found.  Procedures Procedures (including critical care time)  Medications Ordered in ED Medications - No data to display   Initial Impression / Assessment and Plan / ED Course  I have reviewed the triage vital signs and the nursing notes.  Pertinent labs & imaging results that were available  during my care of the patient were reviewed by me and considered in my medical decision making (see chart for details).     61 yo F With a chief complaint of abdominal swelling. Patient is concerned because it happened more rapidly than had previously. She denies fevers or chills. Also complaining of pain to the right posterior aspect of her foot. Denied injury. On my exam patient likely has a heel spur with tenderness right at the attachment of the Achilles to the calcaneus. She has a distended abdomen with a palpable fluid wave. She is not requiring oxygen is well-appearing and nontoxic. She is afebrile her white blood cell count is normal. I do not suspect that she has SBP at this time. I discussed with the patient that routine paracentesis is not commonly done in the emergency department. Since the patient has no follow-up and has not attempted to get follow-up over the past 2 weeks I offered to do the paracentesis in the ED but let her know that she would have to wait until the waiting room had improved. The patient told me that she would rather be admitted to the hospital and have it done by IR. I discussed that that is not normally an option and recommended that she follow-up as an outpatient. She then  became irate and as I come in for a second time so that she could yell at me.Told me that she was going to  file a complaint against me. I discussed with her again that there was no emergent reason for paracentesis. I discussed again that she needs to establish follow-up if she is going to continue to live in the area with liver failure. She is given follow-up with GI as well as told to call the hotline number to establish with a PCP. I instructed her that she is welcome to return at any time should she become concerned have a fever sudden worsening abdominal pain.  4:21 AM:  I have discussed the diagnosis/risks/treatment options with the patient and believe the pt to be eligible for discharge home to follow-up with GI, PCP. We also discussed returning to the ED immediately if new or worsening sx occur. We discussed the sx which are most concerning (e.g., sudden worsening pain, fever, inability to tolerate by mouth) that necessitate immediate return. Medications administered to the patient during their visit and any new prescriptions provided to the patient are listed below.  Medications given during this visit Medications - No data to display   The patient appears reasonably screen and/or stabilized for discharge and I doubt any other medical condition or other [facility] requiring further screening, evaluation, or treatment in the ED at this time prior to discharge.    Final Clinical Impressions(s) / ED Diagnoses   Final diagnoses:  Ascites due to alcoholic cirrhosis (HCC)  Heel spur, right    New Prescriptions New Prescriptions   No medications on file    .I personally performed the services described in this documentation, which was scribed in my presence. The recorded information has been reviewed and is accurate.     Alicia Etienne, DO 08/20/16 [phone]

## 2016-08-20 NOTE — Discharge Instructions (Signed)
Take 4 over the counter ibuprofen tablets 3 times a day or 2 over-the-counter naproxen tablets twice a day for pain. Also take tylenol 500mg ( 1extra strength) four times a day.

## 2016-08-29 ENCOUNTER — Emergency Department (HOSPITAL_COMMUNITY)
Admission: EM | Admit: 2016-08-29 | Discharge: 2016-08-29 | Disposition: A | Discharge status: Home / Self Care | Payer: Medicaid Other | Attending: Emergency Medicine | Admitting: Emergency Medicine

## 2016-08-29 ENCOUNTER — Encounter (HOSPITAL_COMMUNITY): Payer: Self-pay | Admitting: *Deleted

## 2016-08-29 DIAGNOSIS — Z853 Personal history of malignant neoplasm of breast: Secondary | ICD-10-CM | POA: Insufficient documentation

## 2016-08-29 DIAGNOSIS — N183 Chronic kidney disease, stage 3 (moderate): Secondary | ICD-10-CM | POA: Insufficient documentation

## 2016-08-29 DIAGNOSIS — Z79899 Other long term (current) drug therapy: Secondary | ICD-10-CM | POA: Insufficient documentation

## 2016-08-29 DIAGNOSIS — K7031 Alcoholic cirrhosis of liver with ascites: Secondary | ICD-10-CM

## 2016-08-29 DIAGNOSIS — I129 Hypertensive chronic kidney disease with stage 1 through stage 4 chronic kidney disease, or unspecified chronic kidney disease: Secondary | ICD-10-CM | POA: Insufficient documentation

## 2016-08-29 LAB — CBC WITH DIFFERENTIAL/PLATELET
BASOS ABS: 0 10*3/uL (ref 0.0–0.1)
BASOS PCT: 0 %
EOS PCT: 8 %
Eosinophils Absolute: 0.4 10*3/uL (ref 0.0–0.7)
HCT: 28.7 % — ABNORMAL LOW (ref 36.0–46.0)
Hemoglobin: 9.2 g/dL — ABNORMAL LOW (ref 12.0–15.0)
LYMPHS PCT: 34 %
Lymphs Abs: 1.8 10*3/uL (ref 0.7–4.0)
MCH: 24.3 pg — ABNORMAL LOW (ref 26.0–34.0)
MCHC: 32.1 g/dL (ref 30.0–36.0)
MCV: 75.9 fL — AB (ref 78.0–100.0)
Monocytes Absolute: 0.4 10*3/uL (ref 0.1–1.0)
Monocytes Relative: 8 %
Neutro Abs: 2.5 10*3/uL (ref 1.7–7.7)
Neutrophils Relative %: 50 %
PLATELETS: 212 10*3/uL (ref 150–400)
RBC: 3.78 MIL/uL — ABNORMAL LOW (ref 3.87–5.11)
RDW: 16 % — ABNORMAL HIGH (ref 11.5–15.5)
WBC: 5.1 10*3/uL (ref 4.0–10.5)

## 2016-08-29 LAB — COMPREHENSIVE METABOLIC PANEL
ALBUMIN: 3 g/dL — AB (ref 3.5–5.0)
ALT: 15 U/L (ref 14–54)
AST: 30 U/L (ref 15–41)
Alkaline Phosphatase: 127 U/L — ABNORMAL HIGH (ref 38–126)
Anion gap: 7 (ref 5–15)
BUN: 36 mg/dL — ABNORMAL HIGH (ref 6–20)
CHLORIDE: 112 mmol/L — AB (ref 101–111)
CO2: 18 mmol/L — ABNORMAL LOW (ref 22–32)
CREATININE: 2.17 mg/dL — AB (ref 0.44–1.00)
Calcium: 8.7 mg/dL — ABNORMAL LOW (ref 8.9–10.3)
GFR calc Af Amer: 27 mL/min — ABNORMAL LOW (ref >60)
GFR, EST NON AFRICAN AMERICAN: 24 mL/min — AB (ref >60)
GLUCOSE: 109 mg/dL — AB (ref 65–99)
POTASSIUM: 3.7 mmol/L (ref 3.5–5.1)
SODIUM: 137 mmol/L (ref 135–145)
TOTAL PROTEIN: 6.7 g/dL (ref 6.5–8.1)
Total Bilirubin: 0.5 mg/dL (ref 0.3–1.2)

## 2016-08-29 NOTE — ED Triage Notes (Signed)
Pt reports having a fluid build up in her abd.  Pt is new to the area and has an appointment with GI for fluid drawing on Tuesday 5/29. However, pt presents with SOB d/t fluid build up in her abd.  Pt denies pain and is ambulatory.

## 2016-08-29 NOTE — ED Notes (Signed)
BLOOD DRAW DELAYED AS PER EDP, RN AWARE.

## 2016-08-29 NOTE — Discharge Instructions (Signed)
Please call [facility] tomorrow Return if you develop severe pain or a fever

## 2016-08-29 NOTE — ED Provider Notes (Signed)
[facility] DEPT Provider Note   CSN: [id number] Arrival date & time: 08/29/16  1625     History   Chief Complaint No chief complaint on file.   HPI Alicia [name] is a 61 y.o. female who presents with abdominal swelling and distension. PMHx significant for liver cirrhosis with ascites, CKD, portal HTN. She recently moved to the area from [place], [place]. She was admitted from 5/2-5/3 for therapeutic paracentesis. She did not have evidence of SBP at that time. She returned on 5/15 for the same problem. A paracentesis was not done at that visit although it was offered since she did not have any follow up. She now has a follow-up with GI on 5/29 to establish care. She states that she developed associated vomiting and left-sided abdominal pain yesterday. She also has shortness of breath which she attributes to her abdominal distention. She denies fever, chills, chest pain, nausea, diarrhea, dysuria. She is not drink any alcohol in over a year.  HPI  Past Medical History:  Diagnosis Date  . Ascites   . Breast cancer ([place])   . Cancer ([place])   . Chronic anticoagulation   . Cirrhosis ([place])   . Hypertension   . Hypotension     Patient Active Problem List   Diagnosis Date Noted  . Alcoholic cirrhosis of liver with ascites ([place]) 08/08/2016  . HTN (hypertension) 08/08/2016  . Anemia in other chronic diseases classified elsewhere 08/08/2016  . CKD (chronic kidney disease) stage 3, GFR 30-59 ml/min 08/08/2016  . Hypokalemia 08/08/2016  . Gastroesophageal reflux disease   . Portal hypertension ([place])   . Ascites 08/07/2016    Past Surgical History:  Procedure Laterality Date  . ABDOMINAL HYSTERECTOMY    . BREAST LUMPECTOMY    . BREAST SURGERY    . CESAREAN SECTION    . LYMPH NODE DISSECTION      OB History    No data available       Home Medications    Prior to Admission medications   Medication Sig Start Date End Date Taking? Authorizing Provider  acetaminophen  (ACETAMINOPHEN 8 HOUR) 650 MG CR tablet Take 650 mg by mouth every 8 (eight) hours as needed for pain.    [provider]  amLODipine (NORVASC) 5 MG tablet Take 1 tablet (5 mg total) by mouth daily. 08/09/16   Barton Dubois, MD  aspirin-acetaminophen-caffeine (EXCEDRIN MIGRAINE) [phone] MG tablet Take 2 tablets by mouth every 6 (six) hours as needed for headache.    [provider]  furosemide (LASIX) 40 MG tablet Take 1 tablet (40 mg total) by mouth daily. 08/08/16   Barton Dubois, MD  hydrALAZINE (APRESOLINE) 25 MG tablet Take 1 tablet (25 mg total) by mouth 2 (two) times daily. 08/08/16   Barton Dubois, MD  hydrOXYzine (ATARAX/VISTARIL) 25 MG tablet Take 25 mg by mouth 2 (two) times daily as needed for itching.    [provider]  magnesium oxide (MAG-OX) 400 MG tablet Take 400 mg by mouth daily.    [provider]  omeprazole (PRILOSEC) 40 MG capsule Take 1 capsule (40 mg total) by mouth daily. 08/08/16   Barton Dubois, MD  propranolol (INDERAL) 20 MG tablet Take 20 mg by mouth 3 (three) times daily.    [provider]  spironolactone (ALDACTONE) 25 MG tablet Take 3 tablets (75 mg total) by mouth daily. 08/09/16   Barton Dubois, MD    Family History Family History  Problem Relation Age of Onset  . Diabetes  Mother   . Diabetes Father   . Diabetes Brother     Social History Social History  Substance Use Topics  . Smoking status: Never Smoker  . Smokeless tobacco: Never Used  . Alcohol use No     Comment: for alcohol abuse     Allergies   Compazine [prochlorperazine edisylate] and Meclizine   Review of Systems Review of Systems  Constitutional: Negative for chills and fever.  Respiratory: Positive for shortness of breath. Negative for cough.   Cardiovascular: Negative for chest pain.  Gastrointestinal: Positive for abdominal distention, abdominal pain and vomiting. Negative for constipation, diarrhea and nausea.  Genitourinary:  Negative for dysuria.  All other systems reviewed and are negative.    Physical Exam Updated Vital Signs BP (!) 148/104   Pulse 93   Temp 98 F (36.7 C) (Oral)   Resp 20   SpO2 100%   Physical Exam  Constitutional: She is oriented to person, place, and time. She appears well-developed and well-nourished. No distress.  HENT:  Head: Normocephalic and atraumatic.  Eyes: Conjunctivae are normal. Pupils are equal, round, and reactive to light. Right eye exhibits no discharge. Left eye exhibits no discharge. No scleral icterus.  Neck: Normal range of motion.  Cardiovascular: Normal rate and regular rhythm.  Exam reveals no gallop and no friction rub.   No murmur heard. Pulmonary/Chest: Effort normal and breath sounds normal. No respiratory distress. She has no wheezes. She has no rales. She exhibits no tenderness.  Abdominal: Soft. Bowel sounds are normal. She exhibits distension (ascites with +fluid wave). She exhibits no mass. There is no tenderness. There is no rebound and no guarding. A hernia is present.  Neurological: She is alert and oriented to person, place, and time.  Skin: Skin is warm and dry.  Psychiatric: She has a normal mood and affect. Her behavior is normal.  Nursing note and vitals reviewed.    ED Treatments / Results  Labs (all labs ordered are listed, but only abnormal results are displayed) Labs Reviewed  CBC WITH DIFFERENTIAL/PLATELET - Abnormal; Notable for the following:       Result Value   RBC 3.78 (*)    Hemoglobin 9.2 (*)    HCT 28.7 (*)    MCV 75.9 (*)    MCH 24.3 (*)    RDW 16.0 (*)    All other components within normal limits  COMPREHENSIVE METABOLIC PANEL - Abnormal; Notable for the following:    Chloride 112 (*)    CO2 18 (*)    Glucose, Bld 109 (*)    BUN 36 (*)    Creatinine, Ser 2.17 (*)    Calcium 8.7 (*)    Albumin 3.0 (*)    Alkaline Phosphatase 127 (*)    GFR calc non Af Amer 24 (*)    GFR calc Af Amer 27 (*)    All other  components within normal limits    EKG  EKG Interpretation None       Radiology No results found.  Procedures Procedures (including critical care time)  Medications Ordered in ED Medications - No data to display   Initial Impression / Assessment and Plan / ED Course  I have reviewed the triage vital signs and the nursing notes.  Pertinent labs & imaging results that were available during my care of the patient were reviewed by me and considered in my medical decision making (see chart for details).  61 year old female with ascites. She is hypertensive  at times. Otherwise vitals are normal. Abdomen is distended however non-tender. Will check labs. Shared visit with Dr. Lita Mains.  CBC remarkable for anemia which is at her baseline. CMP has many metabolic derangements but is overall at her baseline. She has a follow-up appointment with GI next week. Fever should return precautions should she develop severe pain or fever.   Final Clinical Impressions(s) / ED Diagnoses   Final diagnoses:  Ascites due to alcoholic cirrhosis [facility])    New Prescriptions New Prescriptions   No medications on file     Iris Pert 08/29/16 2018    Julianne Rice, MD 08/29/16 [phone]

## 2016-09-03 ENCOUNTER — Encounter: Payer: Self-pay | Admitting: Physician Assistant

## 2016-09-03 ENCOUNTER — Other Ambulatory Visit (INDEPENDENT_AMBULATORY_CARE_PROVIDER_SITE_OTHER): Payer: Self-pay

## 2016-09-03 ENCOUNTER — Ambulatory Visit (INDEPENDENT_AMBULATORY_CARE_PROVIDER_SITE_OTHER): Payer: Self-pay | Admitting: Physician Assistant

## 2016-09-03 VITALS — BP 114/72 | HR 80 | Ht 60.0 in | Wt 155.0 lb

## 2016-09-03 DIAGNOSIS — K7031 Alcoholic cirrhosis of liver with ascites: Secondary | ICD-10-CM

## 2016-09-03 LAB — CBC WITH DIFFERENTIAL/PLATELET
BASOS PCT: 1.2 % (ref 0.0–3.0)
Basophils Absolute: 0.1 10*3/uL (ref 0.0–0.1)
EOS PCT: 5.3 % — AB (ref 0.0–5.0)
Eosinophils Absolute: 0.2 10*3/uL (ref 0.0–0.7)
HEMATOCRIT: 35.4 % — AB (ref 36.0–46.0)
HEMOGLOBIN: 11.3 g/dL — AB (ref 12.0–15.0)
LYMPHS PCT: 34.8 % (ref 12.0–46.0)
Lymphs Abs: 1.6 10*3/uL (ref 0.7–4.0)
MCHC: 31.9 g/dL (ref 30.0–36.0)
MCV: 76.2 fl — ABNORMAL LOW (ref 78.0–100.0)
Monocytes Absolute: 0.3 10*3/uL (ref 0.1–1.0)
Monocytes Relative: 7.1 % (ref 3.0–12.0)
NEUTROS ABS: 2.3 10*3/uL (ref 1.4–7.7)
Neutrophils Relative %: 51.6 % (ref 43.0–77.0)
Platelets: 264 10*3/uL (ref 150.0–400.0)
RBC: 4.64 Mil/uL (ref 3.87–5.11)
RDW: 16.2 % — AB (ref 11.5–15.5)
WBC: 4.5 10*3/uL (ref 4.0–10.5)

## 2016-09-03 LAB — BASIC METABOLIC PANEL
BUN: 32 mg/dL — ABNORMAL HIGH (ref 6–23)
CO2: 21 meq/L (ref 19–32)
Calcium: 9.5 mg/dL (ref 8.4–10.5)
Chloride: 107 mEq/L (ref 96–112)
Creatinine, Ser: 2.07 mg/dL — ABNORMAL HIGH (ref 0.40–1.20)
GFR: 31.47 mL/min — ABNORMAL LOW (ref >60.00)
Glucose, Bld: 108 mg/dL — ABNORMAL HIGH (ref 70–99)
POTASSIUM: 3.7 meq/L (ref 3.5–5.1)
SODIUM: 136 meq/L (ref 135–145)

## 2016-09-03 LAB — HEPATITIS B SURFACE ANTIGEN: HEP B S AG: NEGATIVE

## 2016-09-03 LAB — HEPATITIS C ANTIBODY: HCV AB: NEGATIVE

## 2016-09-03 LAB — HEPATITIS B SURFACE ANTIBODY,QUALITATIVE: HEP B S AB: NEGATIVE

## 2016-09-03 MED ORDER — ALBUMIN HUMAN 25 % IV SOLN: 12.5000 g | Freq: Once | INTRAVENOUS | Status: DC

## 2016-09-03 MED ORDER — FUROSEMIDE 40 MG PO TABS: ORAL_TABLET | ORAL | 2 refills | Status: DC

## 2016-09-03 MED ORDER — SPIRONOLACTONE 25 MG PO TABS: ORAL_TABLET | ORAL | 1 refills | Status: DC

## 2016-09-03 NOTE — Progress Notes (Addendum)
Subjective:    Patient ID: Alicia [name], female    DOB: 1955-09-24, 61 y.o.   MRN: [id number]  HPI Alicia [name] is a 61 year old African-American female, new to GI today referred by [facility] hospitalist after recent hospitalization. Patient has history of alcohol-induced decompensated liver disease. She says she just moved to [place] from [place] in April 2018. She was hospitalized overnight 61 through 08/08/2016 with significant ascites requesting a paracentesis. She underwent large-volume paracentesis on 08/08/2016 with removal of 7.9 L of fluid. She was discharged home on her pre-admit diuretics with Lasix 40 mg by mouth every morning and spironolactone 75 mg by mouth daily. Patient states that she was diagnosed with cirrhosis in 2016 by her primary care physician in [place]. She was to be referred to GI but to date has not seen a GI physician. She's not had any prior endoscopic evaluation. She says she did drink heavily for several years at least 3 drinks per day up to after she was diagnosed with cirrhosis. She's not certain whether she's been tested for hepatitis. She has no family history of liver disease that she is aware of. She has had ascites over the past year and had at least 1 paracentesis in [place] prior to moving to [place]. She says she's been on diuretics over the past year or so. Her primary complaint is a large abdominal girth which makes her very full and uncomfortable at times. She does have remote history of breast cancer about 20 years ago, history of hypertension and chronic kidney disease stage III. She has had reaccumulation of fluid since paracentesis about 3 weeks ago and would like another paracentesis. Most recent labs 08/20/2016 INR 0.99/pro time of 13, hemoglobin 10.8 hematocrit of 32.9 MCV of 75, platelets 175, sodium 137, BUN 36/creatinine 2.17, albumin 3.0 and 2 bili of 0.5.Marland Kitchen She has not had any abdominal imaging.  Review of Systems Pertinent positive and  negative review of systems were noted in the above HPI section.  All other review of systems was otherwise negative.  Outpatient Encounter Prescriptions as of 09/03/2016  Medication Sig  . acetaminophen (ACETAMINOPHEN 8 HOUR) 650 MG CR tablet Take 650 mg by mouth every 8 (eight) hours as needed for pain.  Marland Kitchen amLODipine (NORVASC) 5 MG tablet Take 1 tablet (5 mg total) by mouth daily.  Marland Kitchen aspirin-acetaminophen-caffeine (EXCEDRIN MIGRAINE) 250-250-65 MG tablet Take 2 tablets by mouth every 6 (six) hours as needed for headache.  . furosemide (LASIX) 40 MG tablet Take 1 1/2 tablets daily.  . hydrALAZINE (APRESOLINE) 25 MG tablet Take 1 tablet (25 mg total) by mouth 2 (two) times daily.  . hydrOXYzine (ATARAX/VISTARIL) 25 MG tablet Take 25 mg by mouth 2 (two) times daily as needed for itching.  . magnesium oxide (MAG-OX) 400 MG tablet Take 400 mg by mouth daily.  Marland Kitchen omeprazole (PRILOSEC) 40 MG capsule Take 1 capsule (40 mg total) by mouth daily. (Patient taking differently: Take 40 mg by mouth daily. As needed)  . propranolol (INDERAL) 20 MG tablet Take 20 mg by mouth 3 (three) times daily.  Marland Kitchen spironolactone (ALDACTONE) 25 MG tablet Take 4 tablets daily.  . [DISCONTINUED] furosemide (LASIX) 40 MG tablet Take 1 tablet (40 mg total) by mouth daily.  . [DISCONTINUED] spironolactone (ALDACTONE) 25 MG tablet Take 3 tablets (75 mg total) by mouth daily.   No facility-administered encounter medications on file as of 09/03/2016.    Allergies  Allergen Reactions  . Compazine [Prochlorperazine Edisylate] Anxiety  . Meclizine Anxiety  Patient Active Problem List   Diagnosis Date Noted  . Alcoholic cirrhosis of liver with ascites ([place]) 08/08/2016  . HTN (hypertension) 08/08/2016  . Anemia in other chronic diseases classified elsewhere 08/08/2016  . CKD (chronic kidney disease) stage 3, GFR 30-59 ml/min 08/08/2016  . Hypokalemia 08/08/2016  . Gastroesophageal reflux disease   . Portal hypertension ([place])     . Ascites 08/07/2016   Social History   Social History  . Marital status: Single    Spouse name: N/A  . Number of children: N/A  . Years of education: N/A   Occupational History  . Not on file.   Social History Main Topics  . Smoking status: Never Smoker  . Smokeless tobacco: Never Used  . Alcohol use No     Comment: for alcohol abuse  . Drug use: No  . Sexual activity: Not on file   Other Topics Concern  . Not on file   Social History Narrative  . No narrative on file    Alicia [name]'s family history includes Diabetes in her brother, father, and mother.      Objective:    Vitals:   09/03/16 1104  BP: 114/72  Pulse: 80    Physical Exam  well-developed older African-American female in no acute distress, pleasant blood pressure 114/72 pulse 80, height 5 foot weight 155, BMI of 30.2. HEENT ;nontraumatic normocephalic EOMI PERRLA sclera anicteric, Cardiovascular; regular rate and rhythm with S1-S2, Pulmonary; clear bilaterally, Abdomen ;very protuberant with non-tense ascites and a protruding umbilical hernia which is reducible she is nontender no palpable mass or hepatosplenomegaly, Rectal ;exam not done, Extremities ;no clubbing cyanosis or edema skin warm and dry, Neuropsych; mood and affect appropriate there is no asterixis       Assessment & Plan:   #71  61 year old African-American female with decompensated alcoholic induced cirrhosis with refractory ascites requiring paracenteses. Current MELD 14  / MELD  sodium 16 #2 anemia #3 chronic kidney disease stage III-creatinine 2.1 #4 history of hypertension #5 remote history of breast cancer  Plan; #1  start 2 g sodium diet #2 increase Lasix from 40 mg by mouth daily to 60 mg by mouth daily #3 increase Aldactone from 75 mg by mouth daily  To 100  mg by mouth daily #4 schedule upper abdominal ultrasound #5 scheduled large volume paracentesis with removal of up to 6 L of fluid. Patient will receive IV albumin  replacement. We will send fluid for cell counts, Gram stain, cytology and total protein #6 schedule for upper endoscopy with Dr. Ardis Hughs to screen for esophageal varices #7 patient has been on propranolol 20 mg by mouth 3 times daily, will continue #8 repeat CBC, be met, check hepatitis serologies and alpha-fetoprotein #9 request old records from her physician in [place] #10 patient will be established with Dr. Ardis Hughs. She will need follow-up office visit in 1 month. #11 will follow BMET closely with increase in diuretics #12 patient is asked to establish a primary physician, and [facility] was recommended. #12  Amy S Esterwood PA-C 09/03/2016   Cc: No ref. provider found

## 2016-09-03 NOTE — Patient Instructions (Signed)
Please go to the basement level to have your labs drawn.  Lasix 40 mg- take 1 1/2 tablet daily.  Soironolactone 25 mg, take 4 tablets daily.  Stay on a 2 gram sodium diet daily.  Get an appointment with [facility].   We will call you with the Ultra sound appointment and  paracentesis appointment.   Follow up with Dr. Owens Loffler.

## 2016-09-04 ENCOUNTER — Encounter (HOSPITAL_COMMUNITY): Payer: Self-pay | Admitting: General Surgery

## 2016-09-04 ENCOUNTER — Ambulatory Visit (HOSPITAL_COMMUNITY)
Admission: RE | Admit: 2016-09-04 | Discharge: 2016-09-04 | Disposition: A | Discharge status: Home / Self Care | Payer: Medicaid Other | Source: Ambulatory Visit | Attending: Physician Assistant | Admitting: Physician Assistant

## 2016-09-04 ENCOUNTER — Other Ambulatory Visit: Payer: Self-pay | Admitting: *Deleted

## 2016-09-04 DIAGNOSIS — R188 Other ascites: Secondary | ICD-10-CM | POA: Diagnosis not present

## 2016-09-04 DIAGNOSIS — K7460 Unspecified cirrhosis of liver: Secondary | ICD-10-CM

## 2016-09-04 DIAGNOSIS — Z853 Personal history of malignant neoplasm of breast: Secondary | ICD-10-CM | POA: Diagnosis not present

## 2016-09-04 DIAGNOSIS — R1011 Right upper quadrant pain: Secondary | ICD-10-CM

## 2016-09-04 DIAGNOSIS — K7031 Alcoholic cirrhosis of liver with ascites: Secondary | ICD-10-CM

## 2016-09-04 DIAGNOSIS — G8929 Other chronic pain: Secondary | ICD-10-CM

## 2016-09-04 DIAGNOSIS — K7290 Hepatic failure, unspecified without coma: Secondary | ICD-10-CM

## 2016-09-04 HISTORY — PX: IR PARACENTESIS: IMG2679

## 2016-09-04 LAB — BODY FLUID CELL COUNT WITH DIFFERENTIAL
Lymphs, Fluid: 35 %
MONOCYTE-MACROPHAGE-SEROUS FLUID: 63 % (ref 50–90)
NEUTROPHIL FLUID: 2 % (ref 0–25)
WBC FLUID: 159 uL (ref 0–1000)

## 2016-09-04 LAB — GRAM STAIN

## 2016-09-04 LAB — AFP TUMOR MARKER: AFP TUMOR MARKER: 5.5 ng/mL (ref <6.1)

## 2016-09-04 LAB — PROTEIN, PLEURAL OR PERITONEAL FLUID: Total protein, fluid: <3 g/dL

## 2016-09-04 MED ORDER — ALBUMIN HUMAN 25 % IV SOLN
50.0000 g | Freq: Once | INTRAVENOUS | Status: AC
  Administered 2016-09-04: 50 g via INTRAVENOUS
  Filled 2016-09-04: qty 200

## 2016-09-04 MED ORDER — LIDOCAINE HCL 1 % IJ SOLN
INTRAMUSCULAR | Status: AC
  Filled 2016-09-04: qty 20

## 2016-09-04 NOTE — Procedures (Signed)
Ultrasound-guided diagnostic and therapeutic paracentesis performed yielding 6 liters of serous colored fluid. No immediate complications.  Alicia [name] E 2:55 PM 09/04/2016

## 2016-09-04 NOTE — Progress Notes (Signed)
I agree with the above note, plan 

## 2016-09-05 ENCOUNTER — Telehealth: Payer: Self-pay | Admitting: Physician Assistant

## 2016-09-05 NOTE — Telephone Encounter (Signed)
Discussed with the patient. Not upset, just didn't understand the terminology used for her order. She did have some leakage at her paracentesis site that night, but it has stopped. The patient is presently uninsured. She is purchasing her medications through the [employer].

## 2016-09-06 ENCOUNTER — Emergency Department (HOSPITAL_COMMUNITY)
Admission: EM | Admit: 2016-09-06 | Discharge: 2016-09-06 | Disposition: A | Discharge status: Home / Self Care | Payer: Self-pay | Attending: Emergency Medicine | Admitting: Emergency Medicine

## 2016-09-06 ENCOUNTER — Encounter (HOSPITAL_COMMUNITY): Payer: Self-pay | Admitting: Emergency Medicine

## 2016-09-06 ENCOUNTER — Ambulatory Visit (HOSPITAL_COMMUNITY): Payer: Self-pay

## 2016-09-06 DIAGNOSIS — R188 Other ascites: Secondary | ICD-10-CM | POA: Insufficient documentation

## 2016-09-06 DIAGNOSIS — Z7982 Long term (current) use of aspirin: Secondary | ICD-10-CM | POA: Insufficient documentation

## 2016-09-06 DIAGNOSIS — I129 Hypertensive chronic kidney disease with stage 1 through stage 4 chronic kidney disease, or unspecified chronic kidney disease: Secondary | ICD-10-CM | POA: Insufficient documentation

## 2016-09-06 DIAGNOSIS — Z79899 Other long term (current) drug therapy: Secondary | ICD-10-CM | POA: Insufficient documentation

## 2016-09-06 DIAGNOSIS — N183 Chronic kidney disease, stage 3 (moderate): Secondary | ICD-10-CM | POA: Insufficient documentation

## 2016-09-06 NOTE — ED Provider Notes (Signed)
[facility] DEPT Provider Note   CSN: [id number] Arrival date & time: 09/06/16  1033     History   Chief Complaint Chief Complaint  Patient presents with  . fluid leaking after paracentesis    HPI Alicia [name] is a 61 y.o. female.  Patient is a 61 year old female who had a paracentesis 2 days ago I interventional radiology. She states she's had some constant dripping from the wound since that time. She denies any worsening pain to the area. No fevers. She states she had this happen one time before and it was stitched. 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      Past Medical History:  Diagnosis Date  . Ascites   . Breast cancer ([name])   . Cancer ([place])   . Chronic anticoagulation   . Cirrhosis ([place])   . Hypertension   . Hypotension     Patient Active Problem List   Diagnosis Date Noted  . Alcoholic cirrhosis of liver with ascites ([place]) 08/08/2016  . HTN (hypertension) 08/08/2016  . Anemia in other chronic diseases classified elsewhere 08/08/2016  . CKD (chronic kidney disease) stage 3, GFR 30-59 ml/min 08/08/2016  . Hypokalemia 08/08/2016  . Gastroesophageal reflux disease   . Portal hypertension ([place])   . Ascites 08/07/2016    Past Surgical History:  Procedure Laterality Date  . ABDOMINAL HYSTERECTOMY    . BREAST LUMPECTOMY    . BREAST SURGERY    . CESAREAN SECTION    . IR PARACENTESIS  09/04/2016  . LYMPH NODE DISSECTION      OB History    No data available       Home Medications    Prior to Admission medications   Medication Sig Start Date End Date Taking? Authorizing Provider  acetaminophen (ACETAMINOPHEN 8 HOUR) 650 MG CR tablet Take 650 mg by mouth every 8 (eight) hours as needed for pain.   Yes [provider]  amLODipine (NORVASC) 5 MG tablet Take 1 tablet (5 mg total) by mouth daily. 08/09/16  Yes Barton Dubois, MD  aspirin-acetaminophen-caffeine (EXCEDRIN MIGRAINE) [phone] MG tablet Take 2 tablets by mouth  every 6 (six) hours as needed for headache.   Yes [provider]  furosemide (LASIX) 40 MG tablet Take 1 1/2 tablets daily. 09/03/16  Yes Esterwood, Amy S, PA-C  hydrALAZINE (APRESOLINE) 25 MG tablet Take 1 tablet (25 mg total) by mouth 2 (two) times daily. 08/08/16  Yes Barton Dubois, MD  hydrOXYzine (ATARAX/VISTARIL) 25 MG tablet Take 25 mg by mouth 2 (two) times daily as needed for itching.   Yes [provider]  magnesium oxide (MAG-OX) 400 MG tablet Take 400 mg by mouth daily.   Yes [provider]  omeprazole (PRILOSEC) 40 MG capsule Take 1 capsule (40 mg total) by mouth daily. Patient taking differently: Take 40 mg by mouth daily. As needed 08/08/16  Yes Barton Dubois, MD  propranolol (INDERAL) 20 MG tablet Take 20 mg by mouth 3 (three) times daily.   Yes [provider]  spironolactone (ALDACTONE) 25 MG tablet Take 4 tablets daily. Patient taking differently: Take 100 mg by mouth daily. Take 4 tablets daily. 09/03/16  Yes Esterwood, Amy S, PA-C    Family History Family History  Problem Relation Age of Onset  . Diabetes Mother   . Diabetes Father   . Diabetes Brother   . Colon cancer Neg Hx   . Stomach cancer Neg Hx   . Esophageal cancer Neg Hx  Social History Social History  Substance Use Topics  . Smoking status: Never Smoker  . Smokeless tobacco: Never Used  . Alcohol use No     Comment: for alcohol abuse     Allergies   Compazine [prochlorperazine edisylate] and Meclizine   Review of Systems Review of Systems  Constitutional: Negative for chills, diaphoresis, fatigue and fever.  HENT: Negative for congestion, rhinorrhea and sneezing.   Eyes: Negative.   Respiratory: Negative for cough, chest tightness and shortness of breath.   Cardiovascular: Negative for chest pain and leg swelling.  Gastrointestinal: Negative for abdominal pain, blood in stool, diarrhea, nausea and vomiting.  Genitourinary: Negative for difficulty  urinating, flank pain, frequency and hematuria.  Musculoskeletal: Negative for arthralgias and back pain.  Skin: Negative for rash.  Neurological: Negative for dizziness, speech difficulty, weakness, numbness and headaches.     Physical Exam Updated Vital Signs BP 118/81   Pulse 63   Temp 97.6 F (36.4 C) (Oral)   Resp 15   Ht 5' (1.524 m)   Wt 66.3 kg (146 lb 2 oz)   SpO2 99%   BMI 28.54 kg/m   Physical Exam  Constitutional: She is oriented to person, place, and time. She appears well-developed and well-nourished.  HENT:  Head: Normocephalic and atraumatic.  Eyes: Pupils are equal, round, and reactive to light.  Neck: Normal range of motion. Neck supple.  Cardiovascular: Normal rate, regular rhythm and normal heart sounds.   Pulmonary/Chest: Effort normal and breath sounds normal. No respiratory distress. She has no wheezes. She has no rales. She exhibits no tenderness.  Abdominal: Soft. Bowel sounds are normal. There is no tenderness. There is no rebound and no guarding.  Positive ascites present. There is a reducible periumbilical hernia.  There is a constant drip of clear fluid from her left upper abdomen puncture wound.  Musculoskeletal: Normal range of motion. She exhibits no edema.  Lymphadenopathy:    She has no cervical adenopathy.  Neurological: She is alert and oriented to person, place, and time.  Skin: Skin is warm and dry. No rash noted.  Psychiatric: She has a normal mood and affect.     ED Treatments / Results  Labs (all labs ordered are listed, but only abnormal results are displayed) Labs Reviewed - No data to display  EKG  EKG Interpretation None       Radiology No results found.  Procedures Procedures (including critical care time)  Medications Ordered in ED Medications - No data to display   Initial Impression / Assessment and Plan / ED Course  I have reviewed the triage vital signs and the nursing notes.  Pertinent labs & imaging  results that were available during my care of the patient were reviewed by me and considered in my medical decision making (see chart for details).     Patient with leak from paracentesis. I cleaned the area. Dried it and placed Dermabond on the site. There is no further dripping.  She doesn't have symptoms that would be more consistent with peritonitis. She has a follow-up appointment with her gastroenterologist on Monday which is in 3 days. Return precautions were given.  Final Clinical Impressions(s) / ED Diagnoses   Final diagnoses:  Other ascites    New Prescriptions Discharge Medication List as of 09/06/2016 12:49 PM       Malvin Johns, MD 09/06/16 1543

## 2016-09-06 NOTE — ED Notes (Signed)
Derma bond applied by EDP

## 2016-09-06 NOTE — ED Triage Notes (Signed)
Patient states that she had paracentesis on Wed and had fluid constantly draining from procedure site.  Patient states this is the second time this has happened.

## 2016-09-06 NOTE — ED Notes (Signed)
Pt verbalized understanding of discharge instructions and denies any further questions at this time.   

## 2016-09-09 ENCOUNTER — Ambulatory Visit (HOSPITAL_COMMUNITY)
Admission: RE | Admit: 2016-09-09 | Discharge: 2016-09-09 | Disposition: A | Discharge status: Home / Self Care | Payer: Self-pay | Source: Ambulatory Visit | Attending: Physician Assistant | Admitting: Physician Assistant

## 2016-09-09 DIAGNOSIS — K829 Disease of gallbladder, unspecified: Secondary | ICD-10-CM | POA: Insufficient documentation

## 2016-09-09 DIAGNOSIS — K7031 Alcoholic cirrhosis of liver with ascites: Secondary | ICD-10-CM | POA: Insufficient documentation

## 2016-09-11 ENCOUNTER — Telehealth: Payer: Self-pay | Admitting: Physician Assistant

## 2016-09-11 DIAGNOSIS — K7031 Alcoholic cirrhosis of liver with ascites: Secondary | ICD-10-CM

## 2016-09-11 NOTE — Telephone Encounter (Signed)
Need to know if she is following low sodium diet and taking her fluid pills ... And if she can weigh herself at home daily in am and record that would help- her weight should be coming down - she was 146 at office visit. Paracentesis weekly is too much ...probably should be seen in office later this week or early next week with BMET before office  visit

## 2016-09-12 NOTE — Telephone Encounter (Signed)
Left message to call back asap

## 2016-09-12 NOTE — Telephone Encounter (Signed)
Left message to call back urgently. No opening with APP this week (thursday or Friday) Cancellation on Dr Ardis Hughs schedule tomorrow. Will try to get her to come in. She does not drive herself.

## 2016-09-13 ENCOUNTER — Other Ambulatory Visit: Payer: Self-pay

## 2016-09-13 ENCOUNTER — Other Ambulatory Visit (INDEPENDENT_AMBULATORY_CARE_PROVIDER_SITE_OTHER): Payer: Self-pay

## 2016-09-13 ENCOUNTER — Ambulatory Visit: Payer: Self-pay | Admitting: Gastroenterology

## 2016-09-13 DIAGNOSIS — K7031 Alcoholic cirrhosis of liver with ascites: Secondary | ICD-10-CM

## 2016-09-13 LAB — BASIC METABOLIC PANEL
BUN: 31 mg/dL — AB (ref 6–23)
CO2: 22 mEq/L (ref 19–32)
CREATININE: 1.98 mg/dL — AB (ref 0.40–1.20)
Calcium: 8.7 mg/dL (ref 8.4–10.5)
Chloride: 107 mEq/L (ref 96–112)
GFR: 33.13 mL/min — AB (ref >60.00)
Glucose, Bld: 119 mg/dL — ABNORMAL HIGH (ref 70–99)
Potassium: 4 mEq/L (ref 3.5–5.1)
Sodium: 137 mEq/L (ref 135–145)

## 2016-09-13 NOTE — Telephone Encounter (Signed)
Spoke with the patient. She is unable to come today to be seen. She can get a ride this evening or Monday to have labs drawn.

## 2016-09-16 ENCOUNTER — Telehealth: Payer: Self-pay | Admitting: Physician Assistant

## 2016-09-17 ENCOUNTER — Other Ambulatory Visit: Payer: Self-pay

## 2016-09-17 DIAGNOSIS — K7031 Alcoholic cirrhosis of liver with ascites: Secondary | ICD-10-CM

## 2016-09-17 MED ORDER — ALBUMIN HUMAN 25 % IV SOLN: 75.0000 g | Freq: Once | INTRAVENOUS | Status: DC

## 2016-09-17 NOTE — Telephone Encounter (Signed)
We can schedule her for another paracentesis- remove 6 liters, no other counts needed from fluid- please order 75 gm Albumin 25% after paracentesis.   repeat BMET 4-5 days after paracentesis Please get her an appt with Dr Silverio Decamp also .Marland Kitchenmay need to thinks about TIPS for her   Dr Silverio Decamp.. Just an Juluis Rainier

## 2016-09-17 NOTE — Telephone Encounter (Signed)
Ok thanks 

## 2016-09-17 NOTE — Telephone Encounter (Signed)
Patient is advised of her labs. She is asking when can she have a paracentesis. Last done 09/04/16. She is anxious to repeat this "because my belly is so heavy and swollen".

## 2016-09-17 NOTE — Telephone Encounter (Signed)
Patient is contacted with information. She will go to the [facility] dept at 8:45 am on 09/20/16 for her paracentesis. Labs next week.

## 2016-09-20 ENCOUNTER — Ambulatory Visit (HOSPITAL_COMMUNITY)
Admission: RE | Admit: 2016-09-20 | Discharge: 2016-09-20 | Disposition: A | Discharge status: Home / Self Care | Payer: Self-pay | Source: Ambulatory Visit | Attending: Gastroenterology | Admitting: Gastroenterology

## 2016-09-20 ENCOUNTER — Encounter (HOSPITAL_COMMUNITY): Payer: Self-pay | Admitting: General Surgery

## 2016-09-20 DIAGNOSIS — Z853 Personal history of malignant neoplasm of breast: Secondary | ICD-10-CM | POA: Insufficient documentation

## 2016-09-20 DIAGNOSIS — K7031 Alcoholic cirrhosis of liver with ascites: Secondary | ICD-10-CM

## 2016-09-20 DIAGNOSIS — K7460 Unspecified cirrhosis of liver: Secondary | ICD-10-CM | POA: Insufficient documentation

## 2016-09-20 DIAGNOSIS — R188 Other ascites: Secondary | ICD-10-CM | POA: Insufficient documentation

## 2016-09-20 HISTORY — PX: IR PARACENTESIS: IMG2679

## 2016-09-20 MED ORDER — LIDOCAINE HCL (PF) 1 % IJ SOLN
INTRAMUSCULAR | Status: DC | PRN
  Administered 2016-09-20: 10 mL

## 2016-09-20 MED ORDER — ALBUMIN HUMAN 25 % IV SOLN
75.0000 g | Freq: Once | INTRAVENOUS | Status: AC
  Administered 2016-09-20: 75 g via INTRAVENOUS
  Filled 2016-09-20: qty 300

## 2016-09-20 MED ORDER — LIDOCAINE HCL 1 % IJ SOLN
INTRAMUSCULAR | Status: AC
  Filled 2016-09-20: qty 20

## 2016-09-20 NOTE — Procedures (Signed)
Ultrasound-guided therapeutic paracentesis performed yielding 6 liters of serous colored fluid. No immediate complications. 6L max and 75g of albumin given  Bryton Waight E 10:47 AM 09/20/2016

## 2016-09-27 ENCOUNTER — Telehealth: Payer: Self-pay | Admitting: Physician Assistant

## 2016-09-30 ENCOUNTER — Other Ambulatory Visit (INDEPENDENT_AMBULATORY_CARE_PROVIDER_SITE_OTHER): Payer: Self-pay

## 2016-09-30 DIAGNOSIS — K7031 Alcoholic cirrhosis of liver with ascites: Secondary | ICD-10-CM

## 2016-09-30 LAB — BASIC METABOLIC PANEL
BUN: 41 mg/dL — ABNORMAL HIGH (ref 6–23)
CHLORIDE: 106 meq/L (ref 96–112)
CO2: 19 meq/L (ref 19–32)
Calcium: 9.4 mg/dL (ref 8.4–10.5)
Creatinine, Ser: 1.87 mg/dL — ABNORMAL HIGH (ref 0.40–1.20)
GFR: 35.38 mL/min — ABNORMAL LOW (ref >60.00)
Glucose, Bld: 102 mg/dL — ABNORMAL HIGH (ref 70–99)
Potassium: 4.7 mEq/L (ref 3.5–5.1)
SODIUM: 131 meq/L — AB (ref 135–145)

## 2016-09-30 NOTE — Telephone Encounter (Signed)
Pt has been advised to come in for labs here at [facility] [place] in the lab.  Pt verbalized understanding and will come in today

## 2016-09-30 NOTE — Telephone Encounter (Signed)
Patty, Please have Dr. Ardis Hughs review Amy Trellis Paganini PA is out of the office

## 2016-10-02 ENCOUNTER — Telehealth: Payer: Self-pay | Admitting: Gastroenterology

## 2016-10-02 DIAGNOSIS — K7031 Alcoholic cirrhosis of liver with ascites: Secondary | ICD-10-CM

## 2016-10-02 NOTE — Telephone Encounter (Signed)
Dr Silverio Decamp the pt is calling to see if she can have paracentesis.  It looks like you ordered labs on 6/25.

## 2016-10-02 NOTE — Telephone Encounter (Signed)
Ok to schedule paracentesis. Please send referral to liver clinic to be evaluated for possible liver transplant, may also need to consider TIPS if she is requiring frequent paracentesis. Follow up visit soon.

## 2016-10-03 NOTE — Telephone Encounter (Signed)
Pt has been scheduled for paracentesis at [facility] 10/07/16 10 am arrive at 9:45 am  Pt has been notified. Dr Ardis Hughs please review Dr Woodward Ku recommendations.  Do you want me to set up to discuss liver transplant and TIPS ?

## 2016-10-03 NOTE — Telephone Encounter (Signed)
McKew, Real Cons, LPN sent to Jeoffrey Massed, RN      Previous Messages    ----- Message -----  From: Mauri Pole, MD  Sent: 10/03/2016 10:16 AM  To: Greggory Keen, LPN   [facility] sorry I didn't check. She will follow with Dr Edison Nasuti  Thanks  ----- Message -----  From: Greggory Keen, LPN  Sent: 7/35/6701  9:17 AM  To: Mauri Pole, MD   She is scheduled on Dr Eugenia Pancoast schedule. Maycen Degregory and I are trying to be certain we are not re-assigning her provider.  I think I ordered the labs in your name once by mistake.

## 2016-10-04 NOTE — Telephone Encounter (Signed)
Pt.notified

## 2016-10-04 NOTE — Telephone Encounter (Signed)
I reviewed her notes, labs but have never met her myself and so I'm reluctant to advise on TIPS or liver TX.  She is scheduled for EGD in about 2 weeks with me. She is scheduled for ROV in about 5 weeks.  She should keep those appts.

## 2016-10-07 ENCOUNTER — Ambulatory Visit (HOSPITAL_COMMUNITY)
Admission: RE | Admit: 2016-10-07 | Discharge: 2016-10-07 | Disposition: A | Discharge status: Home / Self Care | Payer: Medicaid Other | Source: Ambulatory Visit | Attending: Gastroenterology | Admitting: Gastroenterology

## 2016-10-07 ENCOUNTER — Encounter (HOSPITAL_COMMUNITY): Payer: Self-pay

## 2016-10-07 ENCOUNTER — Ambulatory Visit (HOSPITAL_COMMUNITY)
Admission: RE | Admit: 2016-10-07 | Discharge: 2016-10-07 | Disposition: A | Discharge status: Home / Self Care | Payer: Self-pay | Source: Ambulatory Visit | Attending: Gastroenterology | Admitting: Gastroenterology

## 2016-10-07 ENCOUNTER — Other Ambulatory Visit: Payer: Self-pay

## 2016-10-07 DIAGNOSIS — R188 Other ascites: Secondary | ICD-10-CM

## 2016-10-07 DIAGNOSIS — K7031 Alcoholic cirrhosis of liver with ascites: Secondary | ICD-10-CM

## 2016-10-07 MED ORDER — SODIUM CHLORIDE 0.9 % IV SOLN
INTRAVENOUS | Status: AC
  Administered 2016-10-07: 12:00:00 via INTRAVENOUS

## 2016-10-07 MED ORDER — ALBUMIN HUMAN 25 % IV SOLN
50.0000 g | Freq: Once | INTRAVENOUS | Status: AC
  Administered 2016-10-07: 50 g via INTRAVENOUS
  Filled 2016-10-07: qty 200

## 2016-10-07 NOTE — Discharge Instructions (Signed)
Paracentesis, Care After °Refer to this sheet in the next few weeks. These instructions provide you with information about caring for yourself after your procedure. Your health care provider may also give you more specific instructions. Your treatment has been planned according to current medical practices, but problems sometimes occur. Call your health care provider if you have any problems or questions after your procedure. °What can I expect after the procedure? °After your procedure, it is common to have a small amount of clear fluid coming from the puncture site. °Follow these instructions at home: °· Return to your normal activities as told by your health care provider. Ask your health care provider what activities are safe for you. °· Take over-the-counter and prescription medicines only as told by your health care provider. °· Do not take baths, swim, or use a hot tub until your health care provider approves. °· Follow instructions from your health care provider about: °? How to take care of your puncture site. °? When and how you should change your bandage (dressing). °? When you should remove your dressing. °· Check your puncture area every day signs of infection. Watch for: °? Redness, swelling, or pain. °? Fluid, blood, or pus. °· Keep all follow-up visits as told by your health care provider. This is important. °Contact a health care provider if: °· You have redness, swelling, or pain at your puncture site. °· You start to have more clear fluid coming from your puncture site. °· You have blood or pus coming from your puncture site. °· You have chills. °· You have a fever. °Get help right away if: °· You develop chest pain or shortness of breath. °· You develop increasing pain, discomfort, or swelling in your abdomen. °· You feel dizzy or light-headed or you pass out. °This information is not intended to replace advice given to you by your health care provider. Make sure you discuss any questions you  have with your health care provider. °Document Released: 08/09/2014 Document Revised: 08/31/2015 Document Reviewed: 06/07/2014 °Elsevier Interactive Patient Education © 2018 Elsevier Inc. °Albumin injection °What is this medicine? °ALBUMIN (al BYOO min) is used to treat or prevent shock following serious injury, bleeding, surgery, or burns by increasing the volume of blood plasma. This medicine can also replace low blood protein. °This medicine may be used for other purposes; ask your health care provider or pharmacist if you have questions. °COMMON BRAND NAME(S): Albuked, Albumarc, Albuminar, AlbuRx, Albutein, Buminate, Flexbumin, Kedbumin, Macrotec, Plasbumin °What should I tell my health care provider before I take this medicine? °They need to know if you have any of the following conditions: °-anemia °-heart disease °-kidney disease °-an unusual or allergic reaction to albumin, other medicines, foods, dyes, or preservatives °-pregnant or trying to get pregnant °-breast-feeding °How should I use this medicine? °This medicine is for infusion into a vein. It is given by a health-care professional in a hospital or clinic. °Talk to your pediatrician regarding the use of this medicine in children. While this drug may be prescribed for selected conditions, precautions do apply. °Overdosage: If you think you have taken too much of this medicine contact a poison control center or emergency room at once. °NOTE: This medicine is only for you. Do not share this medicine with others. °What if I miss a dose? °This does not apply. °What may interact with this medicine? °Interactions are not expected. °This list may not describe all possible interactions. Give your health care provider a list of all the   medicines, herbs, non-prescription drugs, or dietary supplements you use. Also tell them if you smoke, drink alcohol, or use illegal drugs. Some items may interact with your medicine. °What should I watch for while using this  medicine? °Your condition will be closely monitored while you receive this medicine. °Some products are derived from human plasma, and there is a small risk that these products may contain certain types of virus or bacteria. All products are processed to kill most viruses and bacteria. If you have questions concerning the risk of infections, discuss them with your doctor or health care professional. °What side effects may I notice from receiving this medicine? °Side effects that you should report to your doctor or health care professional as soon as possible: °-allergic reactions like skin rash, itching or hives, swelling of the face, lips, or tongue °-breathing problems °-changes in heartbeat °-fever, chills °-pain, redness or swelling at the injection site °-signs of viral infection including fever, drowsiness, chills, runny nose followed in about 2 weeks by a rash and joint pain °-tightness in the chest °Side effects that usually do not require medical attention (report to your doctor or health care professional if they continue or are bothersome): °-increased salivation °-nausea, vomiting °This list may not describe all possible side effects. Call your doctor for medical advice about side effects. You may report side effects to FDA at 1-800-FDA-1088. °Where should I keep my medicine? °This does not apply. You will not be given this medicine to store at home. °NOTE: This sheet is a summary. It may not cover all possible information. If you have questions about this medicine, talk to your doctor, pharmacist, or health care provider. °© 2018 Elsevier/Gold Standard (2007-06-18 10:18:55) ° ° °

## 2016-10-07 NOTE — Procedures (Signed)
Ultrasound-guided  therapeutic paracentesis performed yielding 6.9 liters of yellow fluid. No immediate complications. The pt will receive IV albumin postprocedure.

## 2016-10-14 ENCOUNTER — Telehealth: Payer: Self-pay | Admitting: Physician Assistant

## 2016-10-14 DIAGNOSIS — R188 Other ascites: Secondary | ICD-10-CM

## 2016-10-14 NOTE — Telephone Encounter (Signed)
Left message for patient to call back  

## 2016-10-15 ENCOUNTER — Telehealth: Payer: Self-pay | Admitting: Gastroenterology

## 2016-10-15 ENCOUNTER — Other Ambulatory Visit: Payer: Self-pay

## 2016-10-15 ENCOUNTER — Other Ambulatory Visit (INDEPENDENT_AMBULATORY_CARE_PROVIDER_SITE_OTHER): Payer: Self-pay

## 2016-10-15 DIAGNOSIS — R188 Other ascites: Secondary | ICD-10-CM

## 2016-10-15 LAB — BASIC METABOLIC PANEL
BUN: 55 mg/dL — AB (ref 6–23)
CALCIUM: 9.3 mg/dL (ref 8.4–10.5)
CO2: 14 mEq/L — ABNORMAL LOW (ref 19–32)
Chloride: 110 mEq/L (ref 96–112)
Creatinine, Ser: 2.13 mg/dL — ABNORMAL HIGH (ref 0.40–1.20)
GFR: 30.44 mL/min — AB (ref >60.00)
GLUCOSE: 107 mg/dL — AB (ref 70–99)
Potassium: 5.4 mEq/L — ABNORMAL HIGH (ref 3.5–5.1)
Sodium: 134 mEq/L — ABNORMAL LOW (ref 135–145)

## 2016-10-15 NOTE — Telephone Encounter (Signed)
The pt has been advised to keep her appt for Monday with Dr Ardis Hughs for EGD and I will forward to [facility] for recommendations on paracentesis prior to seeing Dr Ardis Hughs.  Dr Ardis Hughs the pt had a paracentesis on 10/07/16 and wants to know when she needs another scheduled she says her stomach is "tight"

## 2016-10-15 NOTE — Progress Notes (Signed)
09/21/16 1045 am [facility] paracentesis pt has been notified

## 2016-10-15 NOTE — Telephone Encounter (Signed)
I'll still never met her but from reviewing the previous encounters, records I think repeat large volume paracentesis is reasonable.  Can you arrange for it. She'll need 50gm albumin infusion immediately afterwards, they should take no more than 7liters, they need to send sample for cell count, gram stain.  Also she needs bmet today to make sure all the above is safe from renal perspective.  Thanks

## 2016-10-15 NOTE — Telephone Encounter (Signed)
Dr Ardis Hughs please review labs, is Alicia [name] ok to set up paracentesis?

## 2016-10-15 NOTE — Telephone Encounter (Signed)
Pt has been notified and she will have labs today.  Paracentesis will be set up after results are reviewed.

## 2016-10-16 NOTE — Telephone Encounter (Signed)
See result note.  

## 2016-10-21 ENCOUNTER — Ambulatory Visit (HOSPITAL_COMMUNITY): Admission: RE | Admit: 2016-10-21 | Payer: Self-pay | Source: Ambulatory Visit

## 2016-10-21 ENCOUNTER — Ambulatory Visit (INDEPENDENT_AMBULATORY_CARE_PROVIDER_SITE_OTHER): Payer: Self-pay | Admitting: Gastroenterology

## 2016-10-21 ENCOUNTER — Encounter: Payer: Self-pay | Admitting: Gastroenterology

## 2016-10-21 ENCOUNTER — Encounter (INDEPENDENT_AMBULATORY_CARE_PROVIDER_SITE_OTHER): Payer: Self-pay

## 2016-10-21 ENCOUNTER — Ambulatory Visit (HOSPITAL_COMMUNITY): Payer: Self-pay

## 2016-10-21 VITALS — BP 124/66 | HR 62 | Ht 60.0 in | Wt 152.0 lb

## 2016-10-21 DIAGNOSIS — K7460 Unspecified cirrhosis of liver: Secondary | ICD-10-CM

## 2016-10-21 DIAGNOSIS — R188 Other ascites: Secondary | ICD-10-CM

## 2016-10-21 MED ORDER — HYDROXYZINE HCL 25 MG PO TABS: 25.0000 mg | ORAL_TABLET | Freq: Two times a day (BID) | ORAL | 6 refills | Status: DC | PRN

## 2016-10-21 NOTE — Patient Instructions (Addendum)
You will have labs two days after your upcoming paracentesis in the basement lab.    Your appointment is 10/22/16 at 2 pm at [facility] for your paracentesis.  Please arrive at 1:45 pm.  Go to entrance A.    You should be taking aldactone 100 mg once daily  You should be taking lasix 40mg  once daily.  We will help you get a primary care provider here in [place].  Stop propranolol completely for now.    Will schedule for paracentesis again in 2 weeks (every two weeks for now). (6 liters, with 50gm alb infusion each time).  Please return to see Dr. Ardis Hughs on 11/08/16 at 2:15 pm   Refills on hydroxyzine.

## 2016-10-21 NOTE — Progress Notes (Signed)
Review of pertinent gastrointestinal problems: 1. 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 Moved to [facility] 2018, presented with tense ascites. This is been a problem for her for about one year prior area [place] state [place], never saw gastroenterologist or hepatologist.  Presenting labs: T Bili,platelets, INR all normal, creatinine 2.1; Lab workup 2018: Hepatitis C antibody negative, hepatitis B surface antigen negative, hepatitis B surface antibody negative.  AFP 08/2016 normal  Liver imaging: [place] 09/2016 cirrhosis, no liver tumors, gallstones+, ascites+  Repeated LVPs (ranging 6-8 liters): SAAG not yet checked, No SBP    HPI: This is a 61 year old woman whom I'm meeting for the first time today    Last year she learned about having cirrhosis.  No family members with cirrhosis.  Started having trouble with ascites about a year ago.  Her last alcohol was in November, but still drinks wine once weekly.  Drinks a lot of water daily.  Eats some frozen foods.  Uses Mrs. Dash more than salt  Has never had encephalopathy.  Never fluid in her legs.  Currently taking lasix 80mg  once daily. Currently taking aldactone 50mg  once daily. Currently taking propranolol 40mg  once daily.  Had very briefly discussed liver transplant in [place] but never saw any MD.  Chief complaint is cirrhosis, ascites  ROS: complete GI ROS as described in HPI, all other review negative.  Constitutional:  No unintentional weight loss   Past Medical History:  Diagnosis Date  . Ascites   . Breast cancer ([place])   . Cancer ([place])   . Chronic anticoagulation   . Cirrhosis ([place])   . Hypertension   . Hypotension     Past Surgical History:  Procedure Laterality Date  . ABDOMINAL HYSTERECTOMY    . BREAST LUMPECTOMY    . BREAST SURGERY    . CESAREAN SECTION    . IR PARACENTESIS  09/04/2016  . IR PARACENTESIS  09/20/2016  . LYMPH NODE DISSECTION      Current Outpatient  Prescriptions  Medication Sig Dispense Refill  . acetaminophen (ACETAMINOPHEN 8 HOUR) 650 MG CR tablet Take 650 mg by mouth every 8 (eight) hours as needed for pain.    Marland Kitchen amLODipine (NORVASC) 5 MG tablet Take 1 tablet (5 mg total) by mouth daily. 30 tablet 1  . aspirin-acetaminophen-caffeine (EXCEDRIN MIGRAINE) 638-466-59 MG tablet Take 2 tablets by mouth every 6 (six) hours as needed for headache.    . furosemide (LASIX) 40 MG tablet Take 1 1/2 tablets daily. 100 tablet 2  . hydrALAZINE (APRESOLINE) 25 MG tablet Take 1 tablet (25 mg total) by mouth 2 (two) times daily. 60 tablet 1  . hydrOXYzine (ATARAX/VISTARIL) 25 MG tablet Take 25 mg by mouth 2 (two) times daily as needed for itching.    . magnesium oxide (MAG-OX) 400 MG tablet Take 400 mg by mouth daily.    Marland Kitchen omeprazole (PRILOSEC) 40 MG capsule Take 1 capsule (40 mg total) by mouth daily. (Patient taking differently: Take 40 mg by mouth daily. As needed) 30 capsule 1  . propranolol (INDERAL) 20 MG tablet Take 20 mg by mouth 3 (three) times daily.    Marland Kitchen spironolactone (ALDACTONE) 25 MG tablet Take 4 tablets daily. (Patient taking differently: Take 100 mg by mouth daily. Take 4 tablets daily.) 120 tablet 1   Current Facility-Administered Medications  Medication Dose Route Frequency Provider Last Rate Last Dose  . albumin human 25 % solution 12.5 g  12.5 g Intravenous Once [place], Amy  S, PA-C      . albumin human 25 % solution 75 g  75 g Intravenous Once Mauri Pole, MD        Allergies as of 10/21/2016 - Review Complete 10/21/2016  Allergen Reaction Noted  . Compazine [prochlorperazine edisylate] Anxiety 08/07/2016  . Meclizine Anxiety 08/07/2016    Family History  Problem Relation Age of Onset  . Diabetes Mother   . Diabetes Father   . Diabetes Brother   . Colon cancer Neg Hx   . Stomach cancer Neg Hx   . Esophageal cancer Neg Hx     Social History   Social History  . Marital status: Single    Spouse name: N/A  .  Number of children: N/A  . Years of education: N/A   Occupational History  . Not on file.   Social History Main Topics  . Smoking status: Never Smoker  . Smokeless tobacco: Never Used  . Alcohol use No     Comment: for alcohol abuse  . Drug use: No  . Sexual activity: Not on file   Other Topics Concern  . Not on file   Social History Narrative  . No narrative on file     Physical Exam: BP 124/66 (BP Location: Right Arm, Patient Position: Sitting, Cuff Size: Normal)   Pulse 62   Ht 5' (1.524 m)   Wt 152 lb (68.9 kg)   BMI 29.69 kg/m  Constitutional: generally Chronically ill-appearing Psychiatric: alert and oriented x3 Abdomen: soft, nontender, nondistended, large volume, tense ascites, no peritoneal signs, normal bowel sounds No peripheral edema noted in lower extremities  Assessment and plan: 61 y.o. female with cirrhosis  This is the first time I am meeting her. Reviewing her history of meeting her for the first time today she is clearly noncompliant about the way she is taking her medicines, her medicine doses, appointment scheduling. She missed her large-volume paracentesis today. She is not taking the correct doses of her pills as previously explained. I talked her about the importance of following the directions very clearly going forward. I explained she got a low-salt diet and even avoid salt substitutes is akinetic ascites worse. I'm not sure what she is taking currently in terms of diuretics, I'm not sure I trust which is even telling me today and I'm going to start her fresh today and I explained to her very clearly that these of the doses she should be on. 100 mg of Aldactone once daily, 40 mg of Lasix once daily. Going to stop her propranolol. Not sure why she's been on it. She is not sure. It could be adding to some of her ascites. We will set up a large-volume paracentesis again. Able take out 6 L and give her 50 mg of albumin infusion afterwards. We are  rescheduling that for tomorrow. She will have labs drawn on Thursday. She'll return to see me in 2 or 3 weeks in the office. I told her the basics of TIPS procedure and that she may benefit from 1 unless we are able to get her ascites under control with medication management.  Please see the "Patient Instructions" section for addition details about the plan.  Owens Loffler, MD [facility] 10/21/2016, 2:04 PM

## 2016-10-22 ENCOUNTER — Encounter (HOSPITAL_COMMUNITY): Payer: Self-pay | Admitting: Radiology

## 2016-10-22 ENCOUNTER — Other Ambulatory Visit: Payer: Self-pay

## 2016-10-22 ENCOUNTER — Ambulatory Visit (HOSPITAL_COMMUNITY)
Admission: RE | Admit: 2016-10-22 | Discharge: 2016-10-22 | Disposition: A | Discharge status: Home / Self Care | Payer: Medicaid Other | Source: Ambulatory Visit | Attending: Gastroenterology | Admitting: Gastroenterology

## 2016-10-22 DIAGNOSIS — R188 Other ascites: Secondary | ICD-10-CM | POA: Insufficient documentation

## 2016-10-22 DIAGNOSIS — K7460 Unspecified cirrhosis of liver: Secondary | ICD-10-CM

## 2016-10-22 HISTORY — PX: IR PARACENTESIS: IMG2679

## 2016-10-22 LAB — BODY FLUID CELL COUNT WITH DIFFERENTIAL
LYMPHS FL: 45 %
MONOCYTE-MACROPHAGE-SEROUS FLUID: 53 % (ref 50–90)
Neutrophil Count, Fluid: 2 % (ref 0–25)
Total Nucleated Cell Count, Fluid: 150 cu mm (ref 0–1000)

## 2016-10-22 LAB — GRAM STAIN

## 2016-10-22 MED ORDER — ALBUMIN HUMAN 25 % IV SOLN
50.0000 g | Freq: Once | INTRAVENOUS | Status: AC
  Administered 2016-10-22: 50 g via INTRAVENOUS
  Filled 2016-10-22: qty 200

## 2016-10-22 MED ORDER — LIDOCAINE HCL (PF) 1 % IJ SOLN
INTRAMUSCULAR | Status: AC
  Filled 2016-10-22: qty 30

## 2016-10-22 MED ORDER — LIDOCAINE HCL (PF) 1 % IJ SOLN
INTRAMUSCULAR | Status: DC | PRN
  Administered 2016-10-22: 5 mL

## 2016-10-23 LAB — PATHOLOGIST SMEAR REVIEW: PATH REVIEW: REACTIVE

## 2016-10-24 ENCOUNTER — Other Ambulatory Visit (INDEPENDENT_AMBULATORY_CARE_PROVIDER_SITE_OTHER): Payer: Self-pay

## 2016-10-24 DIAGNOSIS — R188 Other ascites: Secondary | ICD-10-CM

## 2016-10-24 DIAGNOSIS — K7460 Unspecified cirrhosis of liver: Secondary | ICD-10-CM

## 2016-10-24 LAB — COMPREHENSIVE METABOLIC PANEL
ALBUMIN: 3.5 g/dL (ref 3.5–5.2)
ALK PHOS: 117 U/L (ref 39–117)
ALT: 8 U/L (ref 0–35)
AST: 15 U/L (ref 0–37)
BILIRUBIN TOTAL: 0.4 mg/dL (ref 0.2–1.2)
BUN: 47 mg/dL — ABNORMAL HIGH (ref 6–23)
CALCIUM: 8.6 mg/dL (ref 8.4–10.5)
CO2: 20 mEq/L (ref 19–32)
Chloride: 111 mEq/L (ref 96–112)
Creatinine, Ser: 1.94 mg/dL — ABNORMAL HIGH (ref 0.40–1.20)
GFR: 33.9 mL/min — AB (ref >60.00)
GLUCOSE: 103 mg/dL — AB (ref 70–99)
POTASSIUM: 4.6 meq/L (ref 3.5–5.1)
Sodium: 137 mEq/L (ref 135–145)
TOTAL PROTEIN: 6.6 g/dL (ref 6.0–8.3)

## 2016-10-24 LAB — CBC WITH DIFFERENTIAL/PLATELET
BASOS ABS: 0 10*3/uL (ref 0.0–0.1)
Basophils Relative: 0.8 % (ref 0.0–3.0)
EOS PCT: 2.6 % (ref 0.0–5.0)
Eosinophils Absolute: 0.1 10*3/uL (ref 0.0–0.7)
HEMATOCRIT: 30.4 % — AB (ref 36.0–46.0)
Hemoglobin: 9.8 g/dL — ABNORMAL LOW (ref 12.0–15.0)
LYMPHS PCT: 32.3 % (ref 12.0–46.0)
Lymphs Abs: 1.6 10*3/uL (ref 0.7–4.0)
MCHC: 32.2 g/dL (ref 30.0–36.0)
MCV: 77.7 fl — AB (ref 78.0–100.0)
MONOS PCT: 9.8 % (ref 3.0–12.0)
Monocytes Absolute: 0.5 10*3/uL (ref 0.1–1.0)
NEUTROS PCT: 54.5 % (ref 43.0–77.0)
Neutro Abs: 2.7 10*3/uL (ref 1.4–7.7)
Platelets: 185 10*3/uL (ref 150.0–400.0)
RBC: 3.91 Mil/uL (ref 3.87–5.11)
RDW: 17.3 % — ABNORMAL HIGH (ref 11.5–15.5)
WBC: 4.9 10*3/uL (ref 4.0–10.5)

## 2016-10-24 LAB — PROTIME-INR
INR: 1.1 ratio — AB (ref 0.8–1.0)
PROTHROMBIN TIME: 11.9 s (ref 9.6–13.1)

## 2016-10-24 LAB — IBC PANEL
IRON: 53 ug/dL (ref 42–145)
SATURATION RATIOS: 15.3 % — AB (ref 20.0–50.0)
Transferrin: 247 mg/dL (ref 212.0–360.0)

## 2016-10-24 LAB — HEPATITIS A ANTIBODY, TOTAL: HEP A TOTAL AB: NONREACTIVE

## 2016-10-24 LAB — FERRITIN: Ferritin: 12.3 ng/mL (ref 10.0–291.0)

## 2016-10-25 LAB — ANTI-NUCLEAR AB-TITER (ANA TITER): ANA Titer 1: 1:40 {titer} — ABNORMAL HIGH

## 2016-10-25 LAB — ANA: Anti Nuclear Antibody(ANA): POSITIVE — AB

## 2016-10-28 ENCOUNTER — Telehealth: Payer: Self-pay | Admitting: Gastroenterology

## 2016-10-28 LAB — ALPHA-1-ANTITRYPSIN: A-1 Antitrypsin, Ser: 140 mg/dL (ref 83–199)

## 2016-10-28 LAB — ANTI-SMOOTH MUSCLE ANTIBODY, IGG: Smooth Muscle Ab: <20 U (ref <20)

## 2016-10-28 LAB — CERULOPLASMIN: CERULOPLASMIN: 31 mg/dL (ref 18–53)

## 2016-10-28 NOTE — Telephone Encounter (Signed)
Left message on machine to call back  

## 2016-10-29 ENCOUNTER — Other Ambulatory Visit: Payer: Self-pay

## 2016-10-29 DIAGNOSIS — K7460 Unspecified cirrhosis of liver: Secondary | ICD-10-CM

## 2016-10-29 DIAGNOSIS — R188 Other ascites: Secondary | ICD-10-CM

## 2016-10-29 NOTE — Telephone Encounter (Signed)
The pt has been advised that her labs will be reviewed on Monday and I will call her with recommendations.  Also, paracentesis has been added to EPIC.

## 2016-11-04 ENCOUNTER — Telehealth: Payer: Self-pay | Admitting: Gastroenterology

## 2016-11-04 NOTE — Telephone Encounter (Signed)
Patient notified of the appt for 11/08/16, this Friday, to discuss labs and ascites management. She verbalized understanding.

## 2016-11-06 ENCOUNTER — Other Ambulatory Visit: Payer: Self-pay

## 2016-11-06 DIAGNOSIS — K7031 Alcoholic cirrhosis of liver with ascites: Secondary | ICD-10-CM

## 2016-11-06 DIAGNOSIS — I8500 Esophageal varices without bleeding: Secondary | ICD-10-CM

## 2016-11-06 HISTORY — DX: Esophageal varices without bleeding: I85.00

## 2016-11-08 ENCOUNTER — Ambulatory Visit (INDEPENDENT_AMBULATORY_CARE_PROVIDER_SITE_OTHER): Payer: Self-pay | Admitting: Gastroenterology

## 2016-11-08 ENCOUNTER — Encounter: Payer: Self-pay | Admitting: Gastroenterology

## 2016-11-08 ENCOUNTER — Other Ambulatory Visit (INDEPENDENT_AMBULATORY_CARE_PROVIDER_SITE_OTHER): Payer: Self-pay

## 2016-11-08 VITALS — BP 116/62 | HR 88 | Ht 59.0 in | Wt 154.0 lb

## 2016-11-08 DIAGNOSIS — R188 Other ascites: Secondary | ICD-10-CM

## 2016-11-08 DIAGNOSIS — K7460 Unspecified cirrhosis of liver: Secondary | ICD-10-CM

## 2016-11-08 DIAGNOSIS — K7031 Alcoholic cirrhosis of liver with ascites: Secondary | ICD-10-CM

## 2016-11-08 LAB — BASIC METABOLIC PANEL
BUN: 38 mg/dL — AB (ref 6–23)
CHLORIDE: 110 meq/L (ref 96–112)
CO2: 17 meq/L — AB (ref 19–32)
CREATININE: 1.88 mg/dL — AB (ref 0.40–1.20)
Calcium: 8.9 mg/dL (ref 8.4–10.5)
GFR: 35.15 mL/min — ABNORMAL LOW (ref >60.00)
GLUCOSE: 93 mg/dL (ref 70–99)
Potassium: 4.6 mEq/L (ref 3.5–5.1)
Sodium: 133 mEq/L — ABNORMAL LOW (ref 135–145)

## 2016-11-08 NOTE — Progress Notes (Signed)
Review of pertinent gastrointestinal problems: 1. 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 Moved to [facility] 2018, presented with tense ascites. This is been a problem for her for about one year prior area she was hospitalized out of state at least on one occasion, never saw gastroenterologist or hepatologist.  Presenting labs: T Bili,platelets, INR all normal, creatinine 2.1;  Labs workup for cirrhosis: 10/2016: Alpha I antitrypsin level normal, ceruloplasm normal,  Hep A total Ab negative. ASMA neg, Ferritin 12, ANA + (1:40 titer),  Hepatitis C antibody negative, hepatitis B surface antigen negative, hepatitis B surface antibody negative.  AFP 08/2016 normal  Liver imaging: [place] 09/2016 cirrhosis, no liver tumors, gallstones+, ascites+  Repeated LVPs (ranging 6-8 liters): SAAG not yet checked, No SBP; instituted serial LVPs every two weeks starting mid July   Still drinking wine as of 11/2016   HPI: This is a 61 year old woman whom I last saw about one month ago  Chief complaint is decompensated cirrhosis  She has uncomfortable buildup of ascites again She is breathing comfortably  She complains of itching especially on her back  She's had no fevers or chills  She's had no overt GI bleeding   Cr today 1.88, Na 133, K normal.  She does not recall her diuretic doses at all still.  ROS: complete GI ROS as described in HPI, all other review negative.  Weight up 2 pounds since last visit.   Constitutional:  No unintentional weight loss   Past Medical History:  Diagnosis Date  . Ascites   . Breast cancer ([place])   . Cancer ([place])   . Chronic anticoagulation   . Cirrhosis ([place])   . Hypertension   . Hypotension     Past Surgical History:  Procedure Laterality Date  . ABDOMINAL HYSTERECTOMY    . BREAST LUMPECTOMY    . BREAST SURGERY    . CESAREAN SECTION    . IR PARACENTESIS  09/04/2016  . IR PARACENTESIS  09/20/2016  . IR PARACENTESIS  10/22/2016  . LYMPH NODE DISSECTION       Current Outpatient Prescriptions  Medication Sig Dispense Refill  . acetaminophen (ACETAMINOPHEN 8 HOUR) 650 MG CR tablet Take 650 mg by mouth every 8 (eight) hours as needed for pain.    Marland Kitchen amLODipine (NORVASC) 5 MG tablet Take 1 tablet (5 mg total) by mouth daily. 30 tablet 1  . aspirin-acetaminophen-caffeine (EXCEDRIN MIGRAINE) 353-299-24 MG tablet Take 2 tablets by mouth every 6 (six) hours as needed for headache.    . furosemide (LASIX) 40 MG tablet Daily 100 tablet 2  . hydrALAZINE (APRESOLINE) 25 MG tablet Take 1 tablet (25 mg total) by mouth 2 (two) times daily. 60 tablet 1  . hydrOXYzine (ATARAX/VISTARIL) 25 MG tablet Take 1 tablet (25 mg total) by mouth 2 (two) times daily as needed for itching. 30 tablet 6  . magnesium oxide (MAG-OX) 400 MG tablet Take 400 mg by mouth daily.    Marland Kitchen omeprazole (PRILOSEC) 40 MG capsule Take 1 capsule (40 mg total) by mouth daily. (Patient taking differently: Take 40 mg by mouth daily. As needed) 30 capsule 1  . spironolactone (ALDACTONE) 25 MG tablet Take 4 tablets daily. 120 tablet 1   Current Facility-Administered Medications  Medication Dose Route Frequency Provider Last Rate Last Dose  . albumin human 25 % solution 12.5 g  12.5 g Intravenous Once [place], Amy S, PA-C      . albumin human 25 % solution 75 g  75 g Intravenous Once Nandigam, [employer]  V, MD        Allergies as of 11/08/2016 - Review Complete 11/08/2016  Allergen Reaction Noted  . Compazine [prochlorperazine edisylate] Anxiety 08/07/2016  . Meclizine Anxiety 08/07/2016    Family History  Problem Relation Age of Onset  . Diabetes Mother   . Diabetes Father   . Diabetes Brother   . Colon cancer Neg Hx   . Stomach cancer Neg Hx   . Esophageal cancer Neg Hx     Social History   Social History  . Marital status: Single    Spouse name: N/A  . Number of children: N/A  . Years of education: N/A   Occupational History  . Not on file.   Social History Main Topics  .  Smoking status: Never Smoker  . Smokeless tobacco: Never Used  . Alcohol use No     Comment: for alcohol abuse  . Drug use: No  . Sexual activity: Not on file   Other Topics Concern  . Not on file   Social History Narrative  . No narrative on file     Physical Exam: BP 116/62 (BP Location: Left Arm, Patient Position: Sitting, Cuff Size: Normal)   Pulse 88   Ht 4\' 11"  (1.499 m) Comment: height measured without shoes  Wt 154 lb (69.9 kg)   BMI 31.10 kg/m  Constitutional: generally well-appearing Psychiatric: alert and oriented x3 No asterixis, no overt signs of encephalopathy Abdomen: soft, nontender, nondistended, tense ascites, no peritoneal signs, normal bowel sounds No peripheral edema noted in lower extremities  Assessment and plan: 61 y.o. 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 She has very poor insight of her disease and how to take care of herself. I explained very clearly what medicine dose and wanted her on a month ago and she is still absolutely unsure of her doses today. I told her she needs to bring her medicine pill bottles in every time she comes to see me. I'm not sure what her diuretic doses are now although I was hoping that she will be taking Lasix 40 mg once daily and Aldactone 50 mg once a day.  We discussed tips procedure as a way to probably better control her massive ascites. I started to discuss the risks including encephalopathy and she said she is not at all willing to take that risk. She would rather perform have periodic large volume paracentesis as we are doing about 2 weeks for her now. I'm going to arrange for upper endoscopy for varices screening. She will call back with what she feels her doses of her diuretics are. She will return to see me in one month and sooner if needed. Shortly before that she will have  complete metabolic profile, coags and CBC. We will help her arrange large-volume paracentesis again up to 6 or 7 L with albumin infusion. She knows that she can have this done about every 2 weeks.  Please see the "Patient Instructions" section for addition details about the plan.  Owens Loffler, MD [facility] 11/08/2016, 2:30 PM

## 2016-11-08 NOTE — Patient Instructions (Addendum)
You need to bring all your meds to every visit with me.  Please call back with your lasix (furosemide) dose and your aldactone (spironalactone) dose.  We will help set up your next paracentesis, you will set them up after that up to every 2 weeks (7 liters max, 50gram IV albumin each time).  Your next appointment is 11/13/16 at 10 am at [facility].  Please schedule your next appointment at your visit on 11/13/16 for the next paracentesis.    It is important that you have a relatively low salt diet.  High salt diet can cause fluid to accumulate in your legs, abdomen and even around your lungs.  You should try to avoid NSAID type over the counter pain medicines as best as possible. Tylenol is safe to take for 'routine' aches and pains, but never take more than 1/2 the dose suggested on the package instructions (never more than 2 grams per day).  Avoid alcohol.  Please return to see Dr. Ardis Hughs on 12/11/16 at 9:30 am. (bring you meds with you).  You will be set up for an upper endoscopy to screen you for varices on 11/21/16 at 1:30 pm, PLEASE SEE THE INSTRUCTIONS WE HAVE GIVEN YOU TODAY AT YOUR OFFICE VISIT  (WL with MAC sedation).  CMET, cbc, inr on 12/09/16 at the [facility] lab, no appointment is needed the lab is open 7:30 am- 5 pm.   Normal BMI (Body Mass Index- based on height and weight) is between 19 and 25. Your BMI today is Body mass index is 31.1 kg/m. Marland Kitchen Please consider follow up  regarding your BMI with your Primary Care Provider.

## 2016-11-11 ENCOUNTER — Telehealth: Payer: Self-pay | Admitting: Gastroenterology

## 2016-11-11 DIAGNOSIS — R188 Other ascites: Secondary | ICD-10-CM

## 2016-11-11 NOTE — Telephone Encounter (Signed)
FYI Dr Ardis Hughs:  I confirmed she is taking 100 mg daily of spironolactone and 40 mg lasix daily.

## 2016-11-12 MED ORDER — FUROSEMIDE 40 MG PO TABS: ORAL_TABLET | ORAL | 3 refills | Status: DC

## 2016-11-12 MED ORDER — SPIRONOLACTONE 25 MG PO TABS: ORAL_TABLET | ORAL | 3 refills | Status: DC

## 2016-11-12 NOTE — Telephone Encounter (Signed)
I want her to increase those just a bit, see if we can control her ascites a bit better.  New doses should be aldactone 150mg  once dialy.  Lasix 60mg  once daily.  She should start that now and may need new scripts called in (1 month with 6 refills each).  Needs bmet in 2 weeks.  Thanks

## 2016-11-12 NOTE — Telephone Encounter (Signed)
The medication refills have been sent to the pharmacy and the pt aware.  I spoke with her at length and had her repeat the instructions verbally.  She seemed to understand and could tell me the recommendations.  She will have labs in 2 weeks

## 2016-11-13 ENCOUNTER — Ambulatory Visit (HOSPITAL_COMMUNITY)
Admission: RE | Admit: 2016-11-13 | Discharge: 2016-11-13 | Disposition: A | Discharge status: Home / Self Care | Payer: Medicaid Other | Source: Ambulatory Visit | Attending: Gastroenterology | Admitting: Gastroenterology

## 2016-11-13 ENCOUNTER — Telehealth: Payer: Self-pay | Admitting: Gastroenterology

## 2016-11-13 ENCOUNTER — Other Ambulatory Visit: Payer: Self-pay

## 2016-11-13 DIAGNOSIS — K7460 Unspecified cirrhosis of liver: Secondary | ICD-10-CM

## 2016-11-13 DIAGNOSIS — R188 Other ascites: Secondary | ICD-10-CM | POA: Diagnosis present

## 2016-11-13 MED ORDER — LIDOCAINE HCL 1 % IJ SOLN
INTRAMUSCULAR | Status: AC
  Filled 2016-11-13: qty 20

## 2016-11-13 MED ORDER — ALBUMIN HUMAN 25 % IV SOLN
50.0000 g | INTRAVENOUS | Status: DC
  Administered 2016-11-13: 50 g via INTRAVENOUS
  Filled 2016-11-13: qty 200

## 2016-11-13 NOTE — Telephone Encounter (Signed)
Pt aware she needs to schedule next paracentesis with [facility] radiology

## 2016-11-13 NOTE — Progress Notes (Signed)
Order for albumin and [place] for every 2 weeks is under signed and held.

## 2016-11-13 NOTE — Procedures (Signed)
PROCEDURE SUMMARY:  Successful US guided paracentesis from RLQ.  Yielded 7 L of clear yellow fluid.  No immediate complications.  Pt tolerated well.   Specimen was not sent for labs.  Ascencion Dike PA-C 11/13/2016 11:14 AM

## 2016-11-18 ENCOUNTER — Telehealth: Payer: Self-pay | Admitting: Gastroenterology

## 2016-11-18 NOTE — Telephone Encounter (Signed)
Any medicines we prescribe will be expensive (at least in that same range or higher) if she does not have any help from some type of insurance.    I strongly advise her to apply for medicaid and also to get in touch with [place] financial assistance programs.

## 2016-11-18 NOTE — Telephone Encounter (Signed)
The pt's EGD was moved to 8/16 at 230 pm she has been advised and also wants to know what else she can take in place of spironolactone.  Her insurance will no longer pay.  It will be $75 per month.  Please advise

## 2016-11-18 NOTE — Telephone Encounter (Signed)
I spoke with the pt and she has applied for medicaid and will call them and see what the status is.  She has some spironolactone left and will call when she gets down to a few days.

## 2016-11-18 NOTE — Telephone Encounter (Signed)
The spironolactone is $77 dollars per month, the pt has no insurance.  Please advise

## 2016-11-18 NOTE — Telephone Encounter (Signed)
I've never heard that about spironolactone.  Can you look into it?   Does she have insurance?  I cannot tell from her epic chart.

## 2016-11-19 ENCOUNTER — Other Ambulatory Visit: Payer: Self-pay

## 2016-11-19 ENCOUNTER — Telehealth: Payer: Self-pay

## 2016-11-19 ENCOUNTER — Other Ambulatory Visit: Payer: Self-pay | Admitting: Gastroenterology

## 2016-11-19 DIAGNOSIS — R188 Other ascites: Secondary | ICD-10-CM

## 2016-11-19 NOTE — Telephone Encounter (Signed)
Pt has paracentesis scheduled at [facility] every 2 weeks.    Marlita and Karena Addison will get the pt scheduled and give her all the dates and times on 12/03/16 at 945 am. (her next paracentesis)  The pt has been given the appt date and time

## 2016-11-20 ENCOUNTER — Encounter (HOSPITAL_COMMUNITY): Payer: Self-pay | Admitting: *Deleted

## 2016-11-21 ENCOUNTER — Encounter (HOSPITAL_COMMUNITY): Payer: Self-pay

## 2016-11-21 ENCOUNTER — Encounter (HOSPITAL_COMMUNITY)
Admission: RE | Disposition: A | Discharge status: Home / Self Care | Payer: Self-pay | Source: Ambulatory Visit | Attending: Gastroenterology

## 2016-11-21 ENCOUNTER — Ambulatory Visit (HOSPITAL_COMMUNITY)
Admission: RE | Admit: 2016-11-21 | Discharge: 2016-11-21 | Disposition: A | Discharge status: Home / Self Care | Payer: Medicaid Other | Source: Ambulatory Visit | Attending: Gastroenterology | Admitting: Gastroenterology

## 2016-11-21 DIAGNOSIS — I10 Essential (primary) hypertension: Secondary | ICD-10-CM | POA: Insufficient documentation

## 2016-11-21 DIAGNOSIS — I8510 Secondary esophageal varices without bleeding: Secondary | ICD-10-CM

## 2016-11-21 DIAGNOSIS — Z853 Personal history of malignant neoplasm of breast: Secondary | ICD-10-CM | POA: Diagnosis not present

## 2016-11-21 DIAGNOSIS — Z888 Allergy status to other drugs, medicaments and biological substances status: Secondary | ICD-10-CM | POA: Diagnosis not present

## 2016-11-21 DIAGNOSIS — K3189 Other diseases of stomach and duodenum: Secondary | ICD-10-CM | POA: Diagnosis not present

## 2016-11-21 DIAGNOSIS — R188 Other ascites: Secondary | ICD-10-CM | POA: Diagnosis present

## 2016-11-21 DIAGNOSIS — Z79899 Other long term (current) drug therapy: Secondary | ICD-10-CM | POA: Insufficient documentation

## 2016-11-21 DIAGNOSIS — Z13810 Encounter for screening for upper gastrointestinal disorder: Secondary | ICD-10-CM

## 2016-11-21 DIAGNOSIS — K766 Portal hypertension: Secondary | ICD-10-CM | POA: Insufficient documentation

## 2016-11-21 DIAGNOSIS — K7460 Unspecified cirrhosis of liver: Secondary | ICD-10-CM

## 2016-11-21 DIAGNOSIS — Z7982 Long term (current) use of aspirin: Secondary | ICD-10-CM | POA: Diagnosis not present

## 2016-11-21 DIAGNOSIS — I8500 Esophageal varices without bleeding: Secondary | ICD-10-CM | POA: Diagnosis not present

## 2016-11-21 DIAGNOSIS — K7469 Other cirrhosis of liver: Secondary | ICD-10-CM

## 2016-11-21 HISTORY — PX: ESOPHAGOGASTRODUODENOSCOPY (EGD) WITH PROPOFOL: SHX5813

## 2016-11-21 HISTORY — DX: Gastro-esophageal reflux disease without esophagitis: K21.9

## 2016-11-21 SURGERY — ESOPHAGOGASTRODUODENOSCOPY (EGD) WITH PROPOFOL
Anesthesia: Moderate Sedation

## 2016-11-21 MED ORDER — FENTANYL CITRATE (PF) 100 MCG/2ML IJ SOLN
INTRAMUSCULAR | Status: AC
  Filled 2016-11-21: qty 2

## 2016-11-21 MED ORDER — SODIUM CHLORIDE 0.9 % IV SOLN
INTRAVENOUS | Status: DC
  Administered 2016-11-21: 500 mL via INTRAVENOUS

## 2016-11-21 MED ORDER — NADOLOL 40 MG PO TABS: 40.0000 mg | ORAL_TABLET | Freq: Every day | ORAL | 11 refills | Status: DC

## 2016-11-21 MED ORDER — DIPHENHYDRAMINE HCL 50 MG/ML IJ SOLN
INTRAMUSCULAR | Status: AC
  Filled 2016-11-21: qty 1

## 2016-11-21 MED ORDER — FENTANYL CITRATE (PF) 100 MCG/2ML IJ SOLN
INTRAMUSCULAR | Status: DC | PRN
  Administered 2016-11-21 (×2): 25 ug via INTRAVENOUS

## 2016-11-21 MED ORDER — MIDAZOLAM HCL 5 MG/ML IJ SOLN
INTRAMUSCULAR | Status: AC
  Filled 2016-11-21: qty 2

## 2016-11-21 MED ORDER — MIDAZOLAM HCL 5 MG/5ML IJ SOLN
INTRAMUSCULAR | Status: DC | PRN
  Administered 2016-11-21: 2 mg via INTRAVENOUS
  Administered 2016-11-21: 1 mg via INTRAVENOUS
  Administered 2016-11-21: 2 mg via INTRAVENOUS

## 2016-11-21 MED ORDER — DIPHENHYDRAMINE HCL 50 MG/ML IJ SOLN
INTRAMUSCULAR | Status: DC | PRN
  Administered 2016-11-21: 25 mg via INTRAVENOUS

## 2016-11-21 SURGICAL SUPPLY — 14 items

## 2016-11-21 NOTE — Discharge Instructions (Signed)
YOU HAD AN ENDOSCOPIC PROCEDURE TODAY: Refer to the procedure report and other information in the discharge instructions given to you for any specific questions about what was found during the examination. If this information does not answer your questions, please call [place] office at [phone] to clarify.  ° °YOU SHOULD EXPECT: Some feelings of bloating in the abdomen. Passage of more gas than usual. Walking can help get rid of the air that was put into your GI tract during the procedure and reduce the bloating. If you had a lower endoscopy (such as a colonoscopy or flexible sigmoidoscopy) you may notice spotting of blood in your stool or on the toilet paper. Some abdominal soreness may be present for a day or two, also. ° °DIET: Your first meal following the procedure should be a light meal and then it is ok to progress to your normal diet. A half-sandwich or bowl of soup is an example of a good first meal. Heavy or fried foods are harder to digest and may make you feel nauseous or bloated. Drink plenty of fluids but you should avoid alcoholic beverages for 24 hours. If you had a esophageal dilation, please see attached instructions for diet.   ° °ACTIVITY: Your care partner should take you home directly after the procedure. You should plan to take it easy, moving slowly for the rest of the day. You can resume normal activity the day after the procedure however YOU SHOULD NOT DRIVE, use power tools, machinery or perform tasks that involve climbing or major physical exertion for 24 hours (because of the sedation medicines used during the test).  ° °SYMPTOMS TO REPORT IMMEDIATELY: °A gastroenterologist can be reached at any hour. Please call [phone]  for any of the following symptoms:  °Following lower endoscopy (colonoscopy, flexible sigmoidoscopy) °Excessive amounts of blood in the stool  °Significant tenderness, worsening of abdominal pains  °Swelling of the abdomen that is new, acute  °Fever of 100° or  higher  °Following upper endoscopy (EGD, EUS, ERCP, esophageal dilation) °Vomiting of blood or coffee ground material  °New, significant abdominal pain  °New, significant chest pain or pain under the shoulder blades  °Painful or persistently difficult swallowing  °New shortness of breath  °Black, tarry-looking or red, bloody stools ° °FOLLOW UP:  °If any biopsies were taken you will be contacted by phone or by letter within the next 1-3 weeks. Call [phone]  if you have not heard about the biopsies in 3 weeks.  °Please also call with any specific questions about appointments or follow up tests. ° °

## 2016-11-21 NOTE — Interval H&P Note (Signed)
History and Physical Interval Note:  11/21/2016 3:50 PM  Alicia [name]  has presented today for surgery, with the diagnosis of cirrhosis, ascites   The various methods of treatment have been discussed with the patient and family. After consideration of risks, benefits and other options for treatment, the patient has consented to  Procedure(s): ESOPHAGOGASTRODUODENOSCOPY (N/A) as a surgical intervention .  The patient's history has been reviewed, patient examined, no change in status, stable for surgery.  I have reviewed the patient's chart and labs.  Questions were answered to the patient's satisfaction.     Milus Banister

## 2016-11-21 NOTE — H&P (View-Only) (Signed)
Review of pertinent gastrointestinal problems: 1. 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 Moved to [facility] 2018, presented with tense ascites. This is been a problem for her for about one year prior area [place] of state at [place] on one [place], never saw gastroenterologist or hepatologist.  Presenting labs: T Bili,platelets, INR all normal, creatinine 2.1;  Labs workup for cirrhosis: 10/2016: Alpha I antitrypsin level normal, ceruloplasm normal,  Hep A total Ab negative. ASMA neg, Ferritin 12, ANA + (1:40 titer),  Hepatitis C antibody negative, hepatitis B surface antigen negative, hepatitis B surface antibody negative.  AFP 08/2016 normal  Liver imaging: [place] 09/2016 cirrhosis, no liver tumors, gallstones+, ascites+  Repeated LVPs (ranging 6-8 liters): SAAG not yet checked, No SBP; instituted serial LVPs every two weeks starting mid July   Still drinking wine as of 11/2016   HPI: This is a 61 year old woman whom I last saw about one month ago  Chief complaint is decompensated cirrhosis  She has uncomfortable buildup of ascites again She is breathing comfortably  She complains of itching especially on her back  She's had no fevers or chills  She's had no overt GI bleeding   Cr today 1.88, Na 133, K normal.  She does not recall her diuretic doses at all still.  ROS: complete GI ROS as described in HPI, all other review negative.  Weight up 2 pounds since last visit.   Constitutional:  No unintentional weight loss   Past Medical History:  Diagnosis Date  . Ascites   . Breast cancer ([place])   . Cancer ([place])   . Chronic anticoagulation   . Cirrhosis ([place])   . Hypertension   . Hypotension     Past Surgical History:  Procedure Laterality Date  . ABDOMINAL HYSTERECTOMY    . BREAST LUMPECTOMY    . BREAST SURGERY    . CESAREAN SECTION    . IR PARACENTESIS  09/04/2016  . IR PARACENTESIS  09/20/2016  . IR PARACENTESIS  10/22/2016  . LYMPH NODE DISSECTION       Current Outpatient Prescriptions  Medication Sig Dispense Refill  . acetaminophen (ACETAMINOPHEN 8 HOUR) 650 MG CR tablet Take 650 mg by mouth every 8 (eight) hours as needed for pain.    Marland Kitchen amLODipine (NORVASC) 5 MG tablet Take 1 tablet (5 mg total) by mouth daily. 30 tablet 1  . aspirin-acetaminophen-caffeine (EXCEDRIN MIGRAINE) 161-096-04 MG tablet Take 2 tablets by mouth every 6 (six) hours as needed for headache.    . furosemide (LASIX) 40 MG tablet Daily 100 tablet 2  . hydrALAZINE (APRESOLINE) 25 MG tablet Take 1 tablet (25 mg total) by mouth 2 (two) times daily. 60 tablet 1  . hydrOXYzine (ATARAX/VISTARIL) 25 MG tablet Take 1 tablet (25 mg total) by mouth 2 (two) times daily as needed for itching. 30 tablet 6  . magnesium oxide (MAG-OX) 400 MG tablet Take 400 mg by mouth daily.    Marland Kitchen omeprazole (PRILOSEC) 40 MG capsule Take 1 capsule (40 mg total) by mouth daily. (Patient taking differently: Take 40 mg by mouth daily. As needed) 30 capsule 1  . spironolactone (ALDACTONE) 25 MG tablet Take 4 tablets daily. 120 tablet 1   Current Facility-Administered Medications  Medication Dose Route Frequency Provider Last Rate Last Dose  . albumin human 25 % solution 12.5 g  12.5 g Intravenous Once E[place], Amy S, PA-C      . albumin human 25 % solution 75 g  75 g Intravenous Once Nandigam, [employer]  V, MD        Allergies as of 11/08/2016 - Review Complete 11/08/2016  Allergen Reaction Noted  . Compazine [prochlorperazine edisylate] Anxiety 08/07/2016  . Meclizine Anxiety 08/07/2016    Family History  Problem Relation Age of Onset  . Diabetes Mother   . Diabetes Father   . Diabetes Brother   . Colon cancer Neg Hx   . Stomach cancer Neg Hx   . Esophageal cancer Neg Hx     Social History   Social History  . Marital status: Single    Spouse name: N/A  . Number of children: N/A  . Years of education: N/A   Occupational History  . Not on file.   Social History Main Topics  .  Smoking status: Never Smoker  . Smokeless tobacco: Never Used  . Alcohol use No     Comment: for alcohol abuse  . Drug use: No  . Sexual activity: Not on file   Other Topics Concern  . Not on file   Social History Narrative  . No narrative on file     Physical Exam: BP 116/62 (BP Location: Left Arm, Patient Position: Sitting, Cuff Size: Normal)   Pulse 88   Ht 4\' 11"  (1.499 m) Comment: height measured without shoes  Wt 154 lb (69.9 kg)   BMI 31.10 kg/m  Constitutional: generally well-appearing Psychiatric: alert and oriented x3 No asterixis, no overt signs of encephalopathy Abdomen: soft, nontender, nondistended, tense ascites, no peritoneal signs, normal bowel sounds No peripheral edema noted in lower extremities  Assessment and plan: 61 y.o. 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 She has very poor insight of her disease and how to take care of herself. I explained very clearly what medicine dose and wanted her on a month ago and she is still absolutely unsure of her doses today. I told her she needs to bring her medicine pill bottles in every time she comes to see me. I'm not sure what her diuretic doses are now although I was hoping that she will be taking Lasix 40 mg once daily and Aldactone 50 mg once a day.  We discussed tips procedure as a way to probably better control her massive ascites. I started to discuss the risks including encephalopathy and she said she is not at all willing to take that risk. She would rather perform have periodic large volume paracentesis as we are doing about 2 weeks for her now. I'm going to arrange for upper endoscopy for varices screening. She will call back with what she feels her doses of her diuretics are. She will return to see me in one month and sooner if needed. Shortly before that she will have  complete metabolic profile, coags and CBC. We will help her arrange large-volume paracentesis again up to 6 or 7 L with albumin infusion. She knows that she can have this done about every 2 weeks.  Please see the "Patient Instructions" section for addition details about the plan.  Owens Loffler, MD [facility] 11/08/2016, 2:30 PM

## 2016-11-21 NOTE — Op Note (Signed)
[facility] Patient Name: Alicia [name] Procedure Date: 11/21/2016 MRN: [id number] Attending MD: Milus Banister , MD Date of Birth: 1956/03/19 CSN: [id number] Age: 61 Admit Type: Outpatient Procedure:                Upper GI endoscopy Indications:              Screening procedure (known cirrhosis, checking for                            varices) Providers:                Milus Banister, MD, Laverta Baltimore RN, RN, Cletis Athens, Technician Referring MD:              Medicines:                Fentanyl 50 micrograms IV, Midazolam 5 mg IV,                            Diphenhydramine 25 mg IV Complications:            No immediate complications. Estimated blood loss:                            None. Estimated Blood Loss:     Estimated blood loss: none. Procedure:                Pre-Anesthesia Assessment:                           - Prior to the procedure, a History and Physical                            was performed, and patient medications and                            allergies were reviewed. The patient's tolerance of                            previous anesthesia was also reviewed. The risks                            and benefits of the procedure and the sedation                            options and risks were discussed with the patient.                            All questions were answered, and informed consent                            was obtained. Prior Anticoagulants: The patient has                            taken no  previous anticoagulant or antiplatelet                            agents. ASA Grade Assessment: IV - A patient with                            severe systemic disease that is a constant threat                            to life. After reviewing the risks and benefits,                            the patient was deemed in satisfactory condition to                            undergo the procedure.              After obtaining informed consent, the endoscope was                            passed under direct vision. Throughout the                            procedure, the patient's blood pressure, pulse, and                            oxygen saturations were monitored continuously. The                            Olympus GIF-H190 [id number]) endoscope was introduced                            through the mouth, and advanced to the second part                            of duodenum. The upper GI endoscopy was                            accomplished without difficulty. The patient                            tolerated the procedure well. Scope In: Scope Out: Findings:      Small (< 5 mm) varices were found in the lower third of the esophagus.       There were no signs of recent bleeding.      Non-obstructing Schatzki's ring at GE junction. Not dilated.      Mild portal hypertensive gastropathy was found in the entire examined       stomach. There were no gastric varices.      The examined duodenum was normal. Impression:               - Small esophageal varices, mild changes of portal                            gastropathy. No  gastric varices. Moderate Sedation:      N/A- Per Anesthesia Care Recommendation:           - Patient has a contact number available for                            emergencies. The signs and symptoms of potential                            delayed complications were discussed with the                            patient. Return to normal activities tomorrow.                            Written discharge instructions were provided to the                            patient.                           - Resume previous diet.                           - Continue present medications.                           - Will start nadolol (40mg  pill, one pill once                            daily) to decrease chances of variceal bleeding.                            This was  called in today. Procedure Code(s):        --- Professional ---                           [phone], Esophagogastroduodenoscopy, flexible,                            transoral; diagnostic, including collection of                            specimen(s) by brushing or washing, when performed                            (separate procedure) Diagnosis Code(s):        --- Professional ---                           I85.00, Esophageal varices without bleeding                           K76.6, Portal hypertension                           K31.89, Other diseases of stomach and duodenum  Z13.810, Encounter for screening for upper                            gastrointestinal disorder CPT copyright 2016 American Medical Association. All rights reserved. The codes documented in this report are preliminary and upon coder review may  be revised to meet current compliance requirements. Milus Banister, MD 11/21/2016 4:31:27 PM This report has been signed electronically. Number of Addenda: 0

## 2016-11-22 ENCOUNTER — Encounter (HOSPITAL_COMMUNITY): Payer: Self-pay | Admitting: Gastroenterology

## 2016-12-03 ENCOUNTER — Encounter (HOSPITAL_COMMUNITY): Payer: Self-pay

## 2016-12-03 ENCOUNTER — Other Ambulatory Visit (HOSPITAL_COMMUNITY): Payer: Self-pay

## 2016-12-03 ENCOUNTER — Ambulatory Visit (HOSPITAL_COMMUNITY)
Admission: RE | Admit: 2016-12-03 | Discharge: 2016-12-03 | Disposition: A | Discharge status: Home / Self Care | Payer: Medicaid Other | Source: Ambulatory Visit | Attending: Gastroenterology | Admitting: Gastroenterology

## 2016-12-03 ENCOUNTER — Encounter (HOSPITAL_COMMUNITY)
Admission: RE | Admit: 2016-12-03 | Discharge: 2016-12-03 | Disposition: A | Discharge status: Home / Self Care | Payer: Medicaid Other | Source: Ambulatory Visit | Attending: Gastroenterology | Admitting: Gastroenterology

## 2016-12-03 DIAGNOSIS — K7031 Alcoholic cirrhosis of liver with ascites: Secondary | ICD-10-CM | POA: Insufficient documentation

## 2016-12-03 DIAGNOSIS — R188 Other ascites: Secondary | ICD-10-CM | POA: Diagnosis present

## 2016-12-03 MED ORDER — ALBUMIN HUMAN 25 % IV SOLN
50.0000 g | Freq: Once | INTRAVENOUS | Status: AC
  Administered 2016-12-03: 50 g via INTRAVENOUS
  Filled 2016-12-03 (×2): qty 200

## 2016-12-03 MED ORDER — SODIUM CHLORIDE 0.9 % IV SOLN
Freq: Once | INTRAVENOUS | Status: AC
  Administered 2016-12-03: 12:00:00 via INTRAVENOUS

## 2016-12-03 MED ORDER — LIDOCAINE HCL 1 % IJ SOLN
INTRAMUSCULAR | Status: AC
  Filled 2016-12-03: qty 10

## 2016-12-03 NOTE — Discharge Instructions (Signed)
Albumin injection What is this medicine? ALBUMIN (al BYOO min) is used to treat or prevent shock following serious injury, bleeding, surgery, or burns by increasing the volume of blood plasma. This medicine can also replace low blood protein. This medicine may be used for other purposes; ask your health care provider or pharmacist if you have questions. COMMON BRAND NAME(S): Albuked, Albumarc, Albuminar, AlbuRx, Albutein, Buminate, Flexbumin, Kedbumin, Macrotec, Plasbumin What should I tell my health care provider before I take this medicine? They need to know if you have any of the following conditions: -anemia -heart disease -kidney disease -an unusual or allergic reaction to albumin, other medicines, foods, dyes, or preservatives -pregnant or trying to get pregnant -breast-feeding How should I use this medicine? This medicine is for infusion into a vein. It is given by a health-care professional in a hospital or clinic. Talk to your pediatrician regarding the use of this medicine in children. While this drug may be prescribed for selected conditions, precautions do apply. Overdosage: If you think you have taken too much of this medicine contact a poison control center or emergency room at once. NOTE: This medicine is only for you. Do not share this medicine with others. What if I miss a dose? This does not apply. What may interact with this medicine? Interactions are not expected. This list may not describe all possible interactions. Give your health care provider a list of all the medicines, herbs, non-prescription drugs, or dietary supplements you use. Also tell them if you smoke, drink alcohol, or use illegal drugs. Some items may interact with your medicine. What should I watch for while using this medicine? Your condition will be closely monitored while you receive this medicine. Some products are derived from human plasma, and there is a small risk that these products may contain  certain types of virus or bacteria. All products are processed to kill most viruses and bacteria. If you have questions concerning the risk of infections, discuss them with your doctor or health care professional. What side effects may I notice from receiving this medicine? Side effects that you should report to your doctor or health care professional as soon as possible: -allergic reactions like skin rash, itching or hives, swelling of the face, lips, or tongue -breathing problems -changes in heartbeat -fever, chills -pain, redness or swelling at the injection site -signs of viral infection including fever, drowsiness, chills, runny nose followed in about 2 weeks by a rash and joint pain -tightness in the chest Side effects that usually do not require medical attention (report to your doctor or health care professional if they continue or are bothersome): -increased salivation -nausea, vomiting This list may not describe all possible side effects. Call your doctor for medical advice about side effects. You may report side effects to FDA at 1-800-FDA-1088. Where should I keep my medicine? This does not apply. You will not be given this medicine to store at home. NOTE: This sheet is a summary. It may not cover all possible information. If you have questions about this medicine, talk to your doctor, pharmacist, or health care provider.  2018 Elsevier/Gold Standard (2007-06-18 10:18:55)     Paracentesis, Care After Refer to this sheet in the next few weeks. These instructions provide you with information about caring for yourself after your procedure. Your health care provider may also give you more specific instructions. Your treatment has been planned according to current medical practices, but problems sometimes occur. Call your health care provider if you have  any problems or questions after your procedure. What can I expect after the procedure? After your procedure, it is common to have a  small amount of clear fluid coming from the puncture site. Follow these instructions at home:  Return to your normal activities as told by your health care provider. Ask your health care provider what activities are safe for you.  Take over-the-counter and prescription medicines only as told by your health care provider.  Do not take baths, swim, or use a hot tub until your health care provider approves.  Follow instructions from your health care provider about: ? How to take care of your puncture site. ? When and how you should change your bandage (dressing). ? When you should remove your dressing.  Check your puncture area every day signs of infection. Watch for: ? Redness, swelling, or pain. ? Fluid, blood, or pus.  Keep all follow-up visits as told by your health care provider. This is important. Contact a health care provider if:  You have redness, swelling, or pain at your puncture site.  You start to have more clear fluid coming from your puncture site.  You have blood or pus coming from your puncture site.  You have chills.  You have a fever. Get help right away if:  You develop chest pain or shortness of breath.  You develop increasing pain, discomfort, or swelling in your abdomen.  You feel dizzy or light-headed or you pass out. This information is not intended to replace advice given to you by your health care provider. Make sure you discuss any questions you have with your health care provider. Document Released: 08/09/2014 Document Revised: 08/31/2015 Document Reviewed: 06/07/2014 Elsevier Interactive Patient Education  2018 [employer].

## 2016-12-03 NOTE — Procedures (Signed)
Ultrasound-guided  therapeutic paracentesis performed yielding 7 liters (maximum ordered) of yellow fluid. No immediate complications. The pt will receive IV albumin following procedure.

## 2016-12-11 ENCOUNTER — Ambulatory Visit (INDEPENDENT_AMBULATORY_CARE_PROVIDER_SITE_OTHER): Payer: Self-pay | Admitting: Gastroenterology

## 2016-12-11 ENCOUNTER — Other Ambulatory Visit (INDEPENDENT_AMBULATORY_CARE_PROVIDER_SITE_OTHER): Payer: Self-pay

## 2016-12-11 ENCOUNTER — Other Ambulatory Visit: Payer: Self-pay

## 2016-12-11 ENCOUNTER — Encounter: Payer: Self-pay | Admitting: Gastroenterology

## 2016-12-11 VITALS — BP 132/84 | HR 84 | Ht 59.0 in | Wt 161.4 lb

## 2016-12-11 DIAGNOSIS — R188 Other ascites: Secondary | ICD-10-CM

## 2016-12-11 DIAGNOSIS — K7460 Unspecified cirrhosis of liver: Secondary | ICD-10-CM

## 2016-12-11 LAB — CBC WITH DIFFERENTIAL/PLATELET
BASOS ABS: 0 10*3/uL (ref 0.0–0.1)
Basophils Relative: 1 % (ref 0.0–3.0)
EOS ABS: 0.1 10*3/uL (ref 0.0–0.7)
Eosinophils Relative: 3.7 % (ref 0.0–5.0)
HEMATOCRIT: 31.9 % — AB (ref 36.0–46.0)
HEMOGLOBIN: 10.1 g/dL — AB (ref 12.0–15.0)
LYMPHS PCT: 23.8 % (ref 12.0–46.0)
Lymphs Abs: 0.9 10*3/uL (ref 0.7–4.0)
MCHC: 31.6 g/dL (ref 30.0–36.0)
MCV: 80.8 fl (ref 78.0–100.0)
MONOS PCT: 9.2 % (ref 3.0–12.0)
Monocytes Absolute: 0.4 10*3/uL (ref 0.1–1.0)
Neutro Abs: 2.4 10*3/uL (ref 1.4–7.7)
Neutrophils Relative %: 62.3 % (ref 43.0–77.0)
PLATELETS: 155 10*3/uL (ref 150.0–400.0)
RBC: 3.94 Mil/uL (ref 3.87–5.11)
RDW: 17.8 % — ABNORMAL HIGH (ref 11.5–15.5)
WBC: 3.9 10*3/uL — AB (ref 4.0–10.5)

## 2016-12-11 LAB — AMMONIA: Ammonia: 31 umol/L (ref 11–35)

## 2016-12-11 LAB — COMPREHENSIVE METABOLIC PANEL
ALK PHOS: 133 U/L — AB (ref 39–117)
ALT: 14 U/L (ref 0–35)
AST: 21 U/L (ref 0–37)
Albumin: 3.3 g/dL — ABNORMAL LOW (ref 3.5–5.2)
BILIRUBIN TOTAL: 0.3 mg/dL (ref 0.2–1.2)
BUN: 23 mg/dL (ref 6–23)
CALCIUM: 8.9 mg/dL (ref 8.4–10.5)
CO2: 22 meq/L (ref 19–32)
CREATININE: 1.66 mg/dL — AB (ref 0.40–1.20)
Chloride: 108 mEq/L (ref 96–112)
GFR: 40.57 mL/min — AB (ref >60.00)
Glucose, Bld: 137 mg/dL — ABNORMAL HIGH (ref 70–99)
Potassium: 4.7 mEq/L (ref 3.5–5.1)
Sodium: 136 mEq/L (ref 135–145)
TOTAL PROTEIN: 6.6 g/dL (ref 6.0–8.3)

## 2016-12-11 LAB — PROTIME-INR
INR: 1 ratio (ref 0.8–1.0)
PROTHROMBIN TIME: 10.9 s (ref 9.6–13.1)

## 2016-12-11 MED ORDER — SPIRONOLACTONE 50 MG PO TABS: 75.0000 mg | ORAL_TABLET | Freq: Two times a day (BID) | ORAL | 6 refills | Status: DC

## 2016-12-11 MED ORDER — FUROSEMIDE 40 MG PO TABS: 60.0000 mg | ORAL_TABLET | Freq: Every day | ORAL | 6 refills | Status: DC

## 2016-12-11 NOTE — Progress Notes (Signed)
Review of pertinent gastrointestinal problems: 1. 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 Moved to [facility] 2018, presented with tense ascites. This is been a problem for her for about one year prior area [place] of state at [place], never saw gastroenterologist or hepatologist. Presenting labs: T Bili,platelets, INR all normal, creatinine 2.1;  Labs workup for cirrhosis: 10/2016: Alpha I antitrypsin level normal, ceruloplasm normal,  Hep A total Ab negative. ASMA neg, Ferritin 12, ANA + (1:40 titer),  Hepatitis C antibody negative, hepatitis B surface antigen negative, hepatitis B surface antibody negative.  AFP 08/2016 normal  EGD 11/2016 small varices, mild portal gastropathy, started nadolol 40mg  once daily  Liver imaging: [place] 09/2016 cirrhosis, no liver tumors, gallstones+, ascites+  Repeated LVPs (ranging 6-8 liters, followed by albumin): SAAG not yet checked, No SBP; instituted serial LVPs every two weeks starting mid July   Completely stop drinking alcohol as of August 2018   HPI: This is a  very pleasant 61 year old woman whom I last saw about a month ago  Chief complaint is cirrhosis, ascites, edema  She would like a note stating that she is unable to walk up stairs  Last drink of wine was in the past few weeks.  ROS: complete GI ROS as described in HPI, all other review negative.  She had 7 liters LVP a week ago.    Currently she is taking furosemide at 40mg  once daily and aldactone 150mg  once daily.  She is really not so sure about her medicine doses again.  She had LVP scheduled for next week.  Constitutional:  Weight is up 7 pounds since last visit about a month ago.   Past Medical History:  Diagnosis Date  . Ascites   . Breast cancer ([place])   . Cancer ([place])   . Chronic anticoagulation   . Cirrhosis ([place])   . GERD (gastroesophageal reflux disease)   . Hypertension   . Hypotension     Past Surgical History:  Procedure Laterality  Date  . ABDOMINAL HYSTERECTOMY    . BREAST LUMPECTOMY    . BREAST SURGERY    . CESAREAN SECTION    . ESOPHAGOGASTRODUODENOSCOPY (EGD) WITH PROPOFOL N/A 11/21/2016   Procedure: ESOPHAGOGASTRODUODENOSCOPY;  Surgeon: Milus Banister, MD;  Location: [facility] ENDOSCOPY;  Service: Endoscopy;  Laterality: N/A;  . IR PARACENTESIS  09/04/2016  . IR PARACENTESIS  09/20/2016  . IR PARACENTESIS  10/22/2016  . LYMPH NODE DISSECTION      Current Outpatient Prescriptions  Medication Sig Dispense Refill  . acetaminophen (TYLENOL) 500 MG tablet Take 1,000 mg by mouth every 8 (eight) hours as needed for mild pain.    Marland Kitchen amLODipine (NORVASC) 10 MG tablet Take 5 mg by mouth daily. Takes 0.5 tablet    . amLODipine (NORVASC) 5 MG tablet Take 1 tablet (5 mg total) by mouth daily. 30 tablet 1  . aspirin-acetaminophen-caffeine (EXCEDRIN MIGRAINE) 364-680-32 MG tablet Take 2 tablets by mouth every 8 (eight) hours as needed for headache.     . chlorpheniramine (CHLOR-TRIMETON) 4 MG tablet Take 4 mg by mouth daily as needed for allergies.    . diphenhydrAMINE (BENADRYL) spray Apply 1 application topically every 4 (four) hours as needed for itching.    . furosemide (LASIX) 40 MG tablet Take 1 1/2 tabs daily (Patient taking differently: Take 60 mg by mouth daily. Take 1 1/2 tabs daily) 45 tablet 3  . hydrALAZINE (APRESOLINE) 25 MG tablet Take 1 tablet (25 mg total) by mouth 2 (  two) times daily. (Patient taking differently: Take 25 mg by mouth 3 (three) times daily. ) 60 tablet 1  . hydrOXYzine (ATARAX/VISTARIL) 25 MG tablet Take 1 tablet (25 mg total) by mouth 2 (two) times daily as needed for itching. (Patient taking differently: Take 25 mg by mouth 2 (two) times daily. ) 30 tablet 6  . magnesium oxide (MAG-OX) 400 MG tablet Take 400 mg by mouth daily.    . nadolol (CORGARD) 40 MG tablet Take 1 tablet (40 mg total) by mouth daily. 30 tablet 11  . omeprazole (PRILOSEC) 20 MG capsule Take 20 mg by mouth 2 (two) times daily as  needed (indigestion).    Marland Kitchen omeprazole (PRILOSEC) 40 MG capsule Take 1 capsule (40 mg total) by mouth daily. 30 capsule 1  . spironolactone (ALDACTONE) 25 MG tablet Take 6 tablets daily. (Patient taking differently: Take 75 mg by mouth 2 (two) times daily. Take 6 tablets daily.) 180 tablet 3  . triamcinolone cream (KENALOG) 0.1 % Apply 1 application topically 2 (two) times daily as needed (itching).     No current facility-administered medications for this visit.     Allergies as of 12/11/2016 - Review Complete 12/11/2016  Allergen Reaction Noted  . Compazine [prochlorperazine edisylate] Anxiety 08/07/2016  . Meclizine Anxiety 08/07/2016    Family History  Problem Relation Age of Onset  . Diabetes Mother   . Diabetes Father   . Diabetes Brother   . Colon cancer Neg Hx   . Stomach cancer Neg Hx   . Esophageal cancer Neg Hx     Social History   Social History  . Marital status: Single    Spouse name: N/A  . Number of children: N/A  . Years of education: N/A   Occupational History  . Not on file.   Social History Main Topics  . Smoking status: Never Smoker  . Smokeless tobacco: Never Used  . Alcohol use No     Comment: hx of   . Drug use: No  . Sexual activity: Not on file   Other Topics Concern  . Not on file   Social History Narrative  . No narrative on file     Physical Exam: BP 132/84   Pulse 84   Ht 4\' 11"  (1.499 m)   Wt 161 lb 6.4 oz (73.2 kg)   BMI 32.60 kg/m  Constitutional: Chronically ill-appearing Psychiatric: alert and oriented x3, slightly forgetful but not overt encephalopathic Abdomen: soft, nontender, nondistended, large obvious ascites no peritoneal signs, normal bowel sounds 1+ edema in her lower extremities  Assessment and plan: 61 y.o. female with cirrhosis complicated by ascites, edema, esophageal varices; chronic renal insufficiency complicates her care  Currently she is taking Lasix 40 mg once daily and Aldactone 75 mg twice daily.  She will get a basic set of labs including CBC, complete about profile and coags. I will adjust her diuretics if possible upwards.  She was never really clear about her medicine doses. She is forgetful but it isn't clear that she has hepatic encephalopathy. On the get an ammonia level today. I talked with her couple times about perhaps a Tipps procedure. She is very reluctant to undergo this since there would be a risk of hepatic encephalopathy even though that would be reversible. I explained to her that Tipps would probably help her massive ascites and significant decrease her need for large volume paracentesis in the future. She asked about a note I believe for her apartment complex stating that  she is unable to walk up stairs and I'm happy to write that because clearly that would be difficult for her and her current health. She'll return to see me in 2 months. She will continue getting large-volume paracentesis every 2 weeks.   Please see the "Patient Instructions" section for addition details about the plan.  Owens Loffler, MD [facility] 12/11/2016, 9:42 AM

## 2016-12-11 NOTE — Patient Instructions (Addendum)
You will have labs checked today in the basement lab.  Please head down after you check out with the front desk  (cbc, cmet, inr, ammonia). Will adjust your lasix and aldactone after seeing those results.  Please return to see Dr. Ardis Hughs in 6-8 weeks (do not double book).  Letter about not being able to walk up stairs.  It is important that you have a relatively low salt diet.  High salt diet can cause fluid to accumulate in your legs, abdomen and even around your lungs.  You should try to avoid NSAID type over the counter pain medicines as best as possible.  Tylenol is safe to take for 'routine' aches and pains, but never take more than 1/2 the dose suggested on the package instructions (never more than 2 grams per day).  Avoid alcohol.  Normal BMI (Body Mass Index- based on height and weight) is between 19 and 25. Your BMI today is Body mass index is 32.6 kg/m. Marland Kitchen Please consider follow up  regarding your BMI with your Primary Care Provider.

## 2016-12-17 ENCOUNTER — Encounter (HOSPITAL_COMMUNITY): Payer: Self-pay

## 2016-12-17 ENCOUNTER — Encounter (HOSPITAL_COMMUNITY)
Admission: RE | Admit: 2016-12-17 | Discharge: 2016-12-17 | Disposition: A | Discharge status: Home / Self Care | Payer: MEDICAID | Source: Ambulatory Visit | Attending: Gastroenterology | Admitting: Gastroenterology

## 2016-12-17 ENCOUNTER — Ambulatory Visit (HOSPITAL_COMMUNITY)
Admission: RE | Admit: 2016-12-17 | Discharge: 2016-12-17 | Disposition: A | Discharge status: Home / Self Care | Payer: Medicaid Other | Source: Ambulatory Visit | Attending: Gastroenterology | Admitting: Gastroenterology

## 2016-12-17 DIAGNOSIS — K7031 Alcoholic cirrhosis of liver with ascites: Secondary | ICD-10-CM | POA: Diagnosis not present

## 2016-12-17 DIAGNOSIS — R188 Other ascites: Secondary | ICD-10-CM | POA: Diagnosis present

## 2016-12-17 MED ORDER — SODIUM CHLORIDE 0.9 % IV SOLN
Freq: Once | INTRAVENOUS | Status: AC
  Administered 2016-12-17: 12:00:00 via INTRAVENOUS

## 2016-12-17 MED ORDER — ALBUMIN HUMAN 25 % IV SOLN
50.0000 g | Freq: Once | INTRAVENOUS | Status: AC
  Administered 2016-12-17: 50 g via INTRAVENOUS
  Filled 2016-12-17 (×2): qty 200

## 2016-12-17 NOTE — Discharge Instructions (Signed)
Albumin injection °What is this medicine? °ALBUMIN (al BYOO min) is used to treat or prevent shock following serious injury, bleeding, surgery, or burns by increasing the volume of blood plasma. This medicine can also replace low blood protein. °This medicine may be used for other purposes; ask your health care provider or pharmacist if you have questions. °COMMON BRAND NAME(S): Albuked, Albumarc, Albuminar, AlbuRx, Albutein, Buminate, Flexbumin, Kedbumin, Macrotec, Plasbumin °What should I tell my health care provider before I take this medicine? °They need to know if you have any of the following conditions: °-anemia °-heart disease °-kidney disease °-an unusual or allergic reaction to albumin, other medicines, foods, dyes, or preservatives °-pregnant or trying to get pregnant °-breast-feeding °How should I use this medicine? °This medicine is for infusion into a vein. It is given by a health-care professional in a hospital or clinic. °Talk to your pediatrician regarding the use of this medicine in children. While this drug may be prescribed for selected conditions, precautions do apply. °Overdosage: If you think you have taken too much of this medicine contact a poison control center or emergency room at once. °NOTE: This medicine is only for you. Do not share this medicine with others. °What if I miss a dose? °This does not apply. °What may interact with this medicine? °Interactions are not expected. °This list may not describe all possible interactions. Give your health care provider a list of all the medicines, herbs, non-prescription drugs, or dietary supplements you use. Also tell them if you smoke, drink alcohol, or use illegal drugs. Some items may interact with your medicine. °What should I watch for while using this medicine? °Your condition will be closely monitored while you receive this medicine. °Some products are derived from human plasma, and there is a small risk that these products may contain  certain types of virus or bacteria. All products are processed to kill most viruses and bacteria. If you have questions concerning the risk of infections, discuss them with your doctor or health care professional. °What side effects may I notice from receiving this medicine? °Side effects that you should report to your doctor or health care professional as soon as possible: °-allergic reactions like skin rash, itching or hives, swelling of the face, lips, or tongue °-breathing problems °-changes in heartbeat °-fever, chills °-pain, redness or swelling at the injection site °-signs of viral infection including fever, drowsiness, chills, runny nose followed in about 2 weeks by a rash and joint pain °-tightness in the chest °Side effects that usually do not require medical attention (report to your doctor or health care professional if they continue or are bothersome): °-increased salivation °-nausea, vomiting °This list may not describe all possible side effects. Call your doctor for medical advice about side effects. You may report side effects to FDA at 1-800-FDA-1088. °Where should I keep my medicine? °This does not apply. You will not be given this medicine to store at home. °NOTE: This sheet is a summary. It may not cover all possible information. If you have questions about this medicine, talk to your doctor, pharmacist, or health care provider. °© 2018 [employer]vier/Gold Standard (2007-06-18 10:18:55) ° °

## 2016-12-17 NOTE — Procedures (Signed)
Ultrasound-guided therapeutic paracentesis performed yielding 7 liters (maximum ordered) of yellow  fluid. No immediate complications. The patient will receive 50 grams IV albumin infusion post procedure.

## 2016-12-31 ENCOUNTER — Encounter (HOSPITAL_COMMUNITY)
Admission: RE | Admit: 2016-12-31 | Discharge: 2016-12-31 | Disposition: A | Discharge status: Home / Self Care | Payer: Medicaid Other | Source: Ambulatory Visit | Attending: Gastroenterology | Admitting: Gastroenterology

## 2016-12-31 ENCOUNTER — Encounter (HOSPITAL_COMMUNITY): Payer: Self-pay

## 2016-12-31 ENCOUNTER — Ambulatory Visit (HOSPITAL_COMMUNITY)
Admission: RE | Admit: 2016-12-31 | Discharge: 2016-12-31 | Disposition: A | Discharge status: Home / Self Care | Payer: Medicaid Other | Source: Ambulatory Visit | Attending: Gastroenterology | Admitting: Gastroenterology

## 2016-12-31 DIAGNOSIS — R188 Other ascites: Secondary | ICD-10-CM | POA: Insufficient documentation

## 2016-12-31 DIAGNOSIS — K7460 Unspecified cirrhosis of liver: Secondary | ICD-10-CM | POA: Insufficient documentation

## 2016-12-31 MED ORDER — LIDOCAINE HCL 2 % IJ SOLN
INTRAMUSCULAR | Status: AC
  Filled 2016-12-31: qty 10

## 2016-12-31 MED ORDER — SODIUM CHLORIDE 0.9 % IV SOLN
INTRAVENOUS | Status: DC
  Administered 2016-12-31: 13:00:00 via INTRAVENOUS

## 2016-12-31 MED ORDER — ALBUMIN HUMAN 25 % IV SOLN
50.0000 g | Freq: Once | INTRAVENOUS | Status: AC
  Administered 2016-12-31: 50 g via INTRAVENOUS
  Filled 2016-12-31 (×2): qty 200

## 2016-12-31 NOTE — Procedures (Signed)
Ultrasound-guided  therapeutic paracentesis performed yielding 7 liters (maximum ordered) of yellow fluid. No immediate complications. The pt will receive IV albumin postprocedure.

## 2017-01-06 ENCOUNTER — Telehealth: Payer: Self-pay | Admitting: Gastroenterology

## 2017-01-06 NOTE — Telephone Encounter (Signed)
Noted  

## 2017-01-07 ENCOUNTER — Telehealth: Payer: Self-pay | Admitting: Gastroenterology

## 2017-01-07 NOTE — Telephone Encounter (Signed)
A user error has taken place.

## 2017-01-14 ENCOUNTER — Ambulatory Visit (HOSPITAL_COMMUNITY)
Admission: RE | Admit: 2017-01-14 | Discharge: 2017-01-14 | Disposition: A | Discharge status: Home / Self Care | Payer: Medicaid Other | Source: Ambulatory Visit | Attending: Gastroenterology | Admitting: Gastroenterology

## 2017-01-14 ENCOUNTER — Encounter (HOSPITAL_COMMUNITY): Payer: Self-pay

## 2017-01-14 ENCOUNTER — Encounter (HOSPITAL_COMMUNITY)
Admission: RE | Admit: 2017-01-14 | Discharge: 2017-01-14 | Disposition: A | Discharge status: Home / Self Care | Payer: Medicaid Other | Source: Ambulatory Visit | Attending: Gastroenterology | Admitting: Gastroenterology

## 2017-01-14 DIAGNOSIS — R188 Other ascites: Secondary | ICD-10-CM | POA: Diagnosis not present

## 2017-01-14 DIAGNOSIS — K7460 Unspecified cirrhosis of liver: Secondary | ICD-10-CM | POA: Insufficient documentation

## 2017-01-14 MED ORDER — ALBUMIN HUMAN 25 % IV SOLN
50.0000 g | Freq: Once | INTRAVENOUS | Status: AC
  Administered 2017-01-14: 50 g via INTRAVENOUS
  Filled 2017-01-14: qty 200

## 2017-01-14 MED ORDER — LIDOCAINE HCL 1 % IJ SOLN
INTRAMUSCULAR | Status: AC
  Filled 2017-01-14: qty 20

## 2017-01-14 MED ORDER — SODIUM CHLORIDE 0.9 % IV SOLN
Freq: Once | INTRAVENOUS | Status: AC
  Administered 2017-01-14: 12:00:00 via INTRAVENOUS

## 2017-01-14 NOTE — Procedures (Signed)
PROCEDURE SUMMARY:  Successful US guided paracentesis from R:Q.  Yielded 7 L of clear yellow fluid.  No immediate complications.  Pt tolerated well.   Specimen was not sent for labs.  Ascencion Dike PA-C 01/14/2017 11:18 AM

## 2017-01-14 NOTE — Discharge Instructions (Signed)
Albumin injection °What is this medicine? °ALBUMIN (al BYOO min) is used to treat or prevent shock following serious injury, bleeding, surgery, or burns by increasing the volume of blood plasma. This medicine can also replace low blood protein. °This medicine may be used for other purposes; ask your health care provider or pharmacist if you have questions. °COMMON BRAND NAME(S): Albuked, Albumarc, Albuminar, AlbuRx, Albutein, Buminate, Flexbumin, Kedbumin, Macrotec, Plasbumin °What should I tell my health care provider before I take this medicine? °They need to know if you have any of the following conditions: °-anemia °-heart disease °-kidney disease °-an unusual or allergic reaction to albumin, other medicines, foods, dyes, or preservatives °-pregnant or trying to get pregnant °-breast-feeding °How should I use this medicine? °This medicine is for infusion into a vein. It is given by a health-care professional in a hospital or clinic. °Talk to your pediatrician regarding the use of this medicine in children. While this drug may be prescribed for selected conditions, precautions do apply. °Overdosage: If you think you have taken too much of this medicine contact a poison control center or emergency room at once. °NOTE: This medicine is only for you. Do not share this medicine with others. °What if I miss a dose? °This does not apply. °What may interact with this medicine? °Interactions are not expected. °This list may not describe all possible interactions. Give your health care provider a list of all the medicines, herbs, non-prescription drugs, or dietary supplements you use. Also tell them if you smoke, drink alcohol, or use illegal drugs. Some items may interact with your medicine. °What should I watch for while using this medicine? °Your condition will be closely monitored while you receive this medicine. °Some products are derived from human plasma, and there is a small risk that these products may contain  certain types of virus or bacteria. All products are processed to kill most viruses and bacteria. If you have questions concerning the risk of infections, discuss them with your doctor or health care professional. °What side effects may I notice from receiving this medicine? °Side effects that you should report to your doctor or health care professional as soon as possible: °-allergic reactions like skin rash, itching or hives, swelling of the face, lips, or tongue °-breathing problems °-changes in heartbeat °-fever, chills °-pain, redness or swelling at the injection site °-signs of viral infection including fever, drowsiness, chills, runny nose followed in about 2 weeks by a rash and joint pain °-tightness in the chest °Side effects that usually do not require medical attention (report to your doctor or health care professional if they continue or are bothersome): °-increased salivation °-nausea, vomiting °This list may not describe all possible side effects. Call your doctor for medical advice about side effects. You may report side effects to FDA at 1-800-FDA-1088. °Where should I keep my medicine? °This does not apply. You will not be given this medicine to store at home. °NOTE: This sheet is a summary. It may not cover all possible information. If you have questions about this medicine, talk to your doctor, pharmacist, or health care provider. °© 2018 [employer]vier/Gold Standard (2007-06-18 10:18:55) ° °

## 2017-01-24 ENCOUNTER — Telehealth: Payer: Self-pay | Admitting: Gastroenterology

## 2017-01-27 HISTORY — PX: IR PARACENTESIS: IMG2679

## 2017-01-28 ENCOUNTER — Encounter (HOSPITAL_COMMUNITY)
Admission: RE | Admit: 2017-01-28 | Discharge: 2017-01-28 | Disposition: A | Discharge status: Home / Self Care | Payer: Medicaid Other | Source: Ambulatory Visit | Attending: Gastroenterology | Admitting: Gastroenterology

## 2017-01-28 ENCOUNTER — Telehealth: Payer: Self-pay | Admitting: Gastroenterology

## 2017-01-28 ENCOUNTER — Encounter (HOSPITAL_COMMUNITY): Payer: Self-pay

## 2017-01-28 ENCOUNTER — Ambulatory Visit (HOSPITAL_COMMUNITY)
Admission: RE | Admit: 2017-01-28 | Discharge: 2017-01-28 | Disposition: A | Discharge status: Home / Self Care | Payer: Medicaid Other | Source: Ambulatory Visit | Attending: Gastroenterology | Admitting: Gastroenterology

## 2017-01-28 DIAGNOSIS — R188 Other ascites: Secondary | ICD-10-CM | POA: Insufficient documentation

## 2017-01-28 MED ORDER — ALBUMIN HUMAN 25 % IV SOLN: 25.0000 g | Freq: Once | INTRAVENOUS | Status: DC

## 2017-01-28 MED ORDER — LIDOCAINE HCL 2 % IJ SOLN
INTRAMUSCULAR | Status: AC
  Filled 2017-01-28: qty 10

## 2017-01-28 MED ORDER — SODIUM CHLORIDE 0.9 % IV SOLN
INTRAVENOUS | Status: DC
  Administered 2017-01-28: 12:00:00 via INTRAVENOUS

## 2017-01-28 MED ORDER — ALBUMIN HUMAN 25 % IV SOLN
50.0000 g | Freq: Once | INTRAVENOUS | Status: AC
  Administered 2017-01-28: 50 g via INTRAVENOUS
  Filled 2017-01-28: qty 200

## 2017-01-28 NOTE — Telephone Encounter (Signed)
She can take 25 mg up to 4 times a day.  Can you please prescribe her 120 pills with 3 refills at 25 mg strength.  She can also try a single Benadryl 25 mg pill, every night shortly before bed and that might also help with the itching.

## 2017-01-28 NOTE — Procedures (Signed)
PROCEDURE SUMMARY:  Successful US guided paracentesis from right lateral abdomen.  Yielded 6.2 liters of clear, yellow fluid.  No immediate complications.  Pt tolerated well.   Specimen was not sent for labs.  Docia Barrier PA-C 01/28/2017 11:22 AM

## 2017-01-29 ENCOUNTER — Other Ambulatory Visit: Payer: Self-pay

## 2017-01-29 MED ORDER — HYDROXYZINE HCL 25 MG PO TABS: 25.0000 mg | ORAL_TABLET | Freq: Four times a day (QID) | ORAL | 3 refills | Status: DC | PRN

## 2017-01-29 NOTE — Telephone Encounter (Signed)
Spoke with pt and she is aware, script sent to pharmacy for pt. 

## 2017-01-31 ENCOUNTER — Ambulatory Visit (INDEPENDENT_AMBULATORY_CARE_PROVIDER_SITE_OTHER): Payer: Medicaid Other | Admitting: Gastroenterology

## 2017-01-31 ENCOUNTER — Telehealth: Payer: Self-pay | Admitting: Gastroenterology

## 2017-01-31 ENCOUNTER — Encounter: Payer: Self-pay | Admitting: Gastroenterology

## 2017-01-31 ENCOUNTER — Other Ambulatory Visit (INDEPENDENT_AMBULATORY_CARE_PROVIDER_SITE_OTHER): Payer: Medicaid Other

## 2017-01-31 ENCOUNTER — Encounter (INDEPENDENT_AMBULATORY_CARE_PROVIDER_SITE_OTHER): Payer: Self-pay

## 2017-01-31 VITALS — BP 150/82 | HR 76 | Ht 59.0 in | Wt 163.5 lb

## 2017-01-31 DIAGNOSIS — Z23 Encounter for immunization: Secondary | ICD-10-CM | POA: Diagnosis not present

## 2017-01-31 DIAGNOSIS — K7030 Alcoholic cirrhosis of liver without ascites: Secondary | ICD-10-CM

## 2017-01-31 LAB — CBC WITH DIFFERENTIAL/PLATELET
Basophils Absolute: 0.1 10*3/uL (ref 0.0–0.1)
Basophils Relative: 1.2 % (ref 0.0–3.0)
EOS PCT: 2.8 % (ref 0.0–5.0)
Eosinophils Absolute: 0.1 10*3/uL (ref 0.0–0.7)
HEMATOCRIT: 34.3 % — AB (ref 36.0–46.0)
Hemoglobin: 10.6 g/dL — ABNORMAL LOW (ref 12.0–15.0)
LYMPHS ABS: 1.4 10*3/uL (ref 0.7–4.0)
Lymphocytes Relative: 27.4 % (ref 12.0–46.0)
MCHC: 31.1 g/dL (ref 30.0–36.0)
MCV: 80.6 fl (ref 78.0–100.0)
MONOS PCT: 9.6 % (ref 3.0–12.0)
Monocytes Absolute: 0.5 10*3/uL (ref 0.1–1.0)
NEUTROS ABS: 3 10*3/uL (ref 1.4–7.7)
NEUTROS PCT: 59 % (ref 43.0–77.0)
PLATELETS: 171 10*3/uL (ref 150.0–400.0)
RBC: 4.25 Mil/uL (ref 3.87–5.11)
RDW: 15.2 % (ref 11.5–15.5)
WBC: 5.1 10*3/uL (ref 4.0–10.5)

## 2017-01-31 LAB — COMPREHENSIVE METABOLIC PANEL
ALK PHOS: 149 U/L — AB (ref 39–117)
ALT: 9 U/L (ref 0–35)
AST: 20 U/L (ref 0–37)
Albumin: 3.6 g/dL (ref 3.5–5.2)
BUN: 27 mg/dL — ABNORMAL HIGH (ref 6–23)
CALCIUM: 9 mg/dL (ref 8.4–10.5)
CO2: 25 meq/L (ref 19–32)
Chloride: 104 mEq/L (ref 96–112)
Creatinine, Ser: 1.56 mg/dL — ABNORMAL HIGH (ref 0.40–1.20)
GFR: 43.56 mL/min — AB (ref >60.00)
GLUCOSE: 106 mg/dL — AB (ref 70–99)
POTASSIUM: 3.5 meq/L (ref 3.5–5.1)
Sodium: 137 mEq/L (ref 135–145)
TOTAL PROTEIN: 7 g/dL (ref 6.0–8.3)
Total Bilirubin: 0.4 mg/dL (ref 0.2–1.2)

## 2017-01-31 NOTE — Telephone Encounter (Signed)
Patient states she was suppose to call back and let the nurse know about her medications. Pt was seen today 10.26.18 with Dr.Jacobs. Not sure what patient is referring to, also had trouble hearing patient clearly.

## 2017-01-31 NOTE — Telephone Encounter (Signed)
Pt states that she is taking 60mg  of Furosemide daily and 75mg  of Spironalactone bid

## 2017-01-31 NOTE — Telephone Encounter (Signed)
Pt is returning your call

## 2017-01-31 NOTE — Patient Instructions (Addendum)
You will have labs checked today in the basement lab.  Please head down after you check out with the front desk  (cbc, cmet, INR).  Continue every other week large volume paracentesis.  Please return to see Dr. Ardis Hughs in 2 months.  Will contact you with an appointment when schedule is available.  Will start Hepatitis A/B immunization series today.   What we think you should be taking: Furosemide 60mg  once a day. Spironalactone 75mg  twice a day.   Please call back about what you have been taking as far the above meds.  It is important that you have a relatively low salt diet.  High salt diet can cause fluid to accumulate in your legs, abdomen and even around your lungs. You should try to avoid NSAID type over the counter pain medicines as best as possible. Tylenol is safe to take for 'routine' aches and pains, but never take more than 1/2 the dose suggested on the package instructions (never more than 2 grams per day). Avoid alcohol.  Next injection - February 07, 2017 at 2 pm.

## 2017-01-31 NOTE — Progress Notes (Signed)
Review of pertinent gastrointestinal problems: 1. 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 Moved to [facility] 2018, presented with tense ascites. This is been a problem for her for about one year prior area [place], never saw gastroenterologist or hepatologist. Presenting labs: T Bili,platelets, INR all normal, creatinine 2.1;  Labs workup for cirrhosis: 10/2016: Alpha I antitrypsin level normal, ceruloplasm normal, Hep A total A negative. ASMA neg, Ferritin 12, ANA + (1:40 titer), Hepatitis C antibody negative, hepatitis B surface antigen negative, hepatitis B surface antibody negative.  AFP 08/2016 normal  EGD 11/2016 small varices, mild portal gastropathy, started nadolol 40mg  once daily  Liver imaging: [place] 09/2016 cirrhosis, no liver tumors, gallstones+, ascites+  Repeated LVPs (ranging 6-8 liters, followed by albumin): SAAG not yet checked, No SBP; instituted serial LVPs (+albumin) every two weeks starting mid July. Have discussed TIPS several times, she is not interested.  Completely stoped drinking alcohol as of August 2018  Hepatitis A/B immunization series   She has shown repeatedly that she is unable to follow up with basic medical recommendations including lab draws, basic knowledge of her due to diuretic doses.   HPI: This is a pleasant 61 year old woman whom I last saw about a month and a half ago  I had increased her Lasix to 60 mg a day.  I think she has been doing that but she did not bring her medicines and she really is not certain what she has been taking.  She may have been taking twice as much Aldactone as we have been recommending.  This is a recurrent problem of hers  Chief complaint is cirrhosis  No vomiting, no bleeding, her abdomen is distended as usual, no serious encephalopathic signs  ROS: complete GI ROS as described in HPI, all other review negative.  Constitutional:  No unintentional weight loss  Her weight  is up 2 pounds since last visit in September.  Breathing a bit of struggle until the LVP  She believes she is taking 1 1/2 pills of lasix daily  She believes she is taking 6 spironalactone pills daily.    Past Medical History:  Diagnosis Date  . Ascites   . Breast cancer ([place])   . Cancer ([place])   . Chronic anticoagulation   . Cirrhosis ([place])   . GERD (gastroesophageal reflux disease)   . Hypertension   . Hypotension     Past Surgical History:  Procedure Laterality Date  . ABDOMINAL HYSTERECTOMY    . BREAST LUMPECTOMY    . BREAST SURGERY    . CESAREAN SECTION    . ESOPHAGOGASTRODUODENOSCOPY (EGD) WITH PROPOFOL N/A 11/21/2016   Procedure: ESOPHAGOGASTRODUODENOSCOPY;  Surgeon: Milus Banister, MD;  Location: [facility] END[facility]COPY;  Service: Endoscopy;  Laterality: N/A;  . IR PARACENTESIS  09/04/2016  . IR PARACENTESIS  09/20/2016  . IR PARACENTESIS  10/22/2016  . IR PARACENTESIS  01/27/2017  . LYMPH NODE DISSECTION      Current Outpatient Prescriptions  Medication Sig Dispense Refill  . acetaminophen (TYLENOL) 500 MG tablet Take 1,000 mg by mouth every 8 (eight) hours as needed for mild pain.    Marland Kitchen amLODipine (NORVASC) 5 MG tablet Take 1 tablet (5 mg total) by mouth daily. 30 tablet 1  . aspirin-acetaminophen-caffeine (EXCEDRIN MIGRAINE) [id number] MG tablet Take 2 tablets by mouth every 8 (eight) hours as needed for headache.     . chlorpheniramine (CHLOR-TRIMETON) 4 MG tablet Take 4 mg by mouth daily as needed for allergies.    Marland Kitchen  diphenhydrAMINE (BENADRYL) spray Apply 1 application topically every 4 (four) hours as needed for itching.    . hydrALAZINE (APRESOLINE) 25 MG tablet Take 1 tablet (25 mg total) by mouth 2 (two) times daily. (Patient taking differently: Take 25 mg by mouth 3 (three) times daily. ) 60 tablet 1  . hydrOXYzine (ATARAX/VISTARIL) 25 MG tablet Take 1 tablet (25 mg total) by mouth every 6 (six) hours as needed for itching. 120 tablet 3  . magnesium oxide (MAG-OX) 400  MG tablet Take 400 mg by mouth daily.    . nadolol (CORGARD) 40 MG tablet Take 1 tablet (40 mg total) by mouth daily. 30 tablet 11  . omeprazole (PRILOSEC) 20 MG capsule Take 20 mg by mouth 2 (two) times daily as needed (indigestion).    Marland Kitchen omeprazole (PRILOSEC) 40 MG capsule Take 1 capsule (40 mg total) by mouth daily. 30 capsule 1  . triamcinolone cream (KENALOG) 0.1 % Apply 1 application topically 2 (two) times daily as needed (itching).    . furosemide (LASIX) 40 MG tablet Take 1.5 tablets (60 mg total) by mouth daily. Take 1 1/2 tabs daily 45 tablet 6  . spironolactone (ALDACTONE) 50 MG tablet Take 1.5 tablets (75 mg total) by mouth 2 (two) times daily. 90 tablet 6   No current facility-administered medications for this visit.     Allergies as of 01/31/2017 - Review Complete 01/31/2017  Allergen Reaction Noted  . Compazine [prochlorperazine edisylate] Anxiety 08/07/2016  . Meclizine Anxiety 08/07/2016    Family History  Problem Relation Age of Onset  . Diabetes Mother   . Diabetes Father   . Diabetes Brother   . Colon cancer Neg Hx   . Stomach cancer Neg Hx   . Esophageal cancer Neg Hx     Social History   Social History  . Marital status: Single    Spouse name: N/A  . Number of children: N/A  . Years of education: N/A   Occupational History  . Not on file.   Social History Main Topics  . Smoking status: Never Smoker  . Smokeless tobacco: Never Used  . Alcohol use No     Comment: hx of   . Drug use: No  . Sexual activity: Not on file   Other Topics Concern  . Not on file   Social History Narrative  . No narrative on file     Physical Exam: Ht 4\' 11"  (1.499 m)   Wt 163 lb 8 oz (74.2 kg)   BMI 33.02 kg/m  Constitutional: chronically ill appearing. Psychiatric: alert and oriented x3 Abdomen: soft, nontender, nondistended, obvious ascites no peritoneal signs, normal bowel sounds 1+ peripheral edema in her lower extremities  Assessment and plan: 61 y.o.  female with cirrhosis, EtOH related  She continues getting large volume paracentesis every 2 weeks.  She has again failed to show for recommended labs 3 weeks ago.  She is also again very unaware of her diuretic doses.  I believe like over this with her every time she visits.  She asked about liver transplantation I explained to her that she would not be a candidate at this point since she is unable to manage even basic medical recommendations and follow-up plans.  She has declined TIPS procedures although I think she would probably be a good candidate for it.  She is going to call back with the doses of what she has been taking in terms of diuretics.  She will get basic labs drawn today  including CBC, complete metabolic profile coags is and I will try to adjust her diuretics upward if possible.  She will continue getting large volume paracentesis every 2 weeks.  We will start her on hepatitis A, B immunization series today.  She will return to see me in 2 months and sooner if needed.  Please see the "Patient Instructions" section for addition details about the plan.  Owens Loffler, MD [facility] 01/31/2017, 10:11 AM

## 2017-01-31 NOTE — Telephone Encounter (Signed)
Left message for pt to return call. Per Dr. Audelia Acton note, pt is supposed to call back with dosages of Lasix and Spironalactone. Please as pt when she returns call. Thank you.

## 2017-02-01 LAB — PROTIME-INR
INR: 1
Prothrombin Time: 10.3 s (ref 9.0–11.5)

## 2017-02-03 ENCOUNTER — Other Ambulatory Visit: Payer: Self-pay

## 2017-02-03 DIAGNOSIS — K7030 Alcoholic cirrhosis of liver without ascites: Secondary | ICD-10-CM

## 2017-02-07 ENCOUNTER — Ambulatory Visit (INDEPENDENT_AMBULATORY_CARE_PROVIDER_SITE_OTHER): Payer: Medicaid Other | Admitting: Gastroenterology

## 2017-02-07 DIAGNOSIS — Z23 Encounter for immunization: Secondary | ICD-10-CM | POA: Diagnosis not present

## 2017-02-07 DIAGNOSIS — R188 Other ascites: Secondary | ICD-10-CM

## 2017-02-07 DIAGNOSIS — K7460 Unspecified cirrhosis of liver: Secondary | ICD-10-CM

## 2017-02-07 NOTE — Patient Instructions (Addendum)
Your next injection is due Wednesday, November 28th at 2:30.  Thank you.

## 2017-02-11 ENCOUNTER — Ambulatory Visit (HOSPITAL_COMMUNITY)
Admission: RE | Admit: 2017-02-11 | Discharge: 2017-02-11 | Disposition: A | Discharge status: Home / Self Care | Payer: Medicaid Other | Source: Ambulatory Visit | Attending: Gastroenterology | Admitting: Gastroenterology

## 2017-02-11 ENCOUNTER — Encounter (HOSPITAL_COMMUNITY): Payer: Self-pay

## 2017-02-11 ENCOUNTER — Encounter (HOSPITAL_COMMUNITY)
Admission: RE | Admit: 2017-02-11 | Discharge: 2017-02-11 | Disposition: A | Discharge status: Home / Self Care | Payer: Medicaid Other | Source: Ambulatory Visit | Attending: Gastroenterology | Admitting: Gastroenterology

## 2017-02-11 DIAGNOSIS — R188 Other ascites: Secondary | ICD-10-CM | POA: Insufficient documentation

## 2017-02-11 DIAGNOSIS — K7460 Unspecified cirrhosis of liver: Secondary | ICD-10-CM | POA: Insufficient documentation

## 2017-02-11 MED ORDER — ALBUMIN HUMAN 25 % IV SOLN
50.0000 g | Freq: Once | INTRAVENOUS | Status: DC
  Filled 2017-02-11: qty 200

## 2017-02-11 MED ORDER — SODIUM CHLORIDE 0.9 % IV SOLN
Freq: Once | INTRAVENOUS | Status: AC
  Administered 2017-02-11: 12:00:00 via INTRAVENOUS

## 2017-02-11 MED ORDER — ALBUMIN HUMAN 25 % IV SOLN
50.0000 g | Freq: Once | INTRAVENOUS | Status: AC
  Administered 2017-02-11: 50 g via INTRAVENOUS
  Filled 2017-02-11: qty 200

## 2017-02-11 MED ORDER — LIDOCAINE HCL 1 % IJ SOLN
INTRAMUSCULAR | Status: AC
  Filled 2017-02-11: qty 10

## 2017-02-11 MED ORDER — ALBUMIN HUMAN 25 % IV SOLN: 50.0000 g | Freq: Once | INTRAVENOUS | Status: DC

## 2017-02-11 NOTE — Procedures (Signed)
  PROCEDURE SUMMARY:  Successful US guided paracentesis from RLQ.  Yielded 7 L of clear yellow fluid.  No immediate complications.  Pt tolerated well.   Specimen was not sent for labs.  Ascencion Dike PA-C 02/11/2017 10:55 AM

## 2017-02-17 ENCOUNTER — Other Ambulatory Visit (INDEPENDENT_AMBULATORY_CARE_PROVIDER_SITE_OTHER): Payer: Medicaid Other

## 2017-02-17 DIAGNOSIS — K7030 Alcoholic cirrhosis of liver without ascites: Secondary | ICD-10-CM

## 2017-02-17 LAB — BASIC METABOLIC PANEL
BUN: 35 mg/dL — ABNORMAL HIGH (ref 6–23)
CO2: 24 mEq/L (ref 19–32)
Calcium: 8.8 mg/dL (ref 8.4–10.5)
Chloride: 105 mEq/L (ref 96–112)
Creatinine, Ser: 1.84 mg/dL — ABNORMAL HIGH (ref 0.40–1.20)
GFR: 36 mL/min — AB (ref >60.00)
Glucose, Bld: 131 mg/dL — ABNORMAL HIGH (ref 70–99)
Potassium: 3.2 mEq/L — ABNORMAL LOW (ref 3.5–5.1)
Sodium: 139 mEq/L (ref 135–145)

## 2017-02-19 ENCOUNTER — Other Ambulatory Visit: Payer: Self-pay

## 2017-02-19 DIAGNOSIS — K7460 Unspecified cirrhosis of liver: Secondary | ICD-10-CM

## 2017-02-25 ENCOUNTER — Ambulatory Visit (HOSPITAL_COMMUNITY)
Admission: RE | Admit: 2017-02-25 | Discharge: 2017-02-25 | Disposition: A | Discharge status: Home / Self Care | Payer: Medicaid Other | Source: Ambulatory Visit | Attending: Gastroenterology | Admitting: Gastroenterology

## 2017-02-25 ENCOUNTER — Encounter (HOSPITAL_COMMUNITY)
Admission: RE | Admit: 2017-02-25 | Discharge: 2017-02-25 | Disposition: A | Discharge status: Home / Self Care | Payer: Medicaid Other | Source: Ambulatory Visit | Attending: Gastroenterology | Admitting: Gastroenterology

## 2017-02-25 ENCOUNTER — Encounter (HOSPITAL_COMMUNITY): Payer: Self-pay

## 2017-02-25 DIAGNOSIS — R188 Other ascites: Secondary | ICD-10-CM | POA: Diagnosis not present

## 2017-02-25 MED ORDER — ALBUMIN HUMAN 25 % IV SOLN
50.0000 g | Freq: Once | INTRAVENOUS | Status: AC
  Administered 2017-02-25: 50 g via INTRAVENOUS
  Filled 2017-02-25 (×2): qty 200

## 2017-02-25 MED ORDER — LIDOCAINE HCL 2 % IJ SOLN
INTRAMUSCULAR | Status: AC
  Filled 2017-02-25: qty 10

## 2017-02-25 MED ORDER — SODIUM CHLORIDE 0.9 % IV SOLN
INTRAVENOUS | Status: DC
  Administered 2017-02-25: 11:00:00 via INTRAVENOUS

## 2017-02-25 NOTE — Discharge Instructions (Signed)
Albumin injection °What is this medicine? °ALBUMIN (al BYOO min) is used to treat or prevent shock following serious injury, bleeding, surgery, or burns by increasing the volume of blood plasma. This medicine can also replace low blood protein. °This medicine may be used for other purposes; ask your health care provider or pharmacist if you have questions. °COMMON BRAND NAME(S): Albuked, Albumarc, Albuminar, AlbuRx, Albutein, Buminate, Flexbumin, Kedbumin, Macrotec, Plasbumin °What should I tell my health care provider before I take this medicine? °They need to know if you have any of the following conditions: °-anemia °-heart disease °-kidney disease °-an unusual or allergic reaction to albumin, other medicines, foods, dyes, or preservatives °-pregnant or trying to get pregnant °-breast-feeding °How should I use this medicine? °This medicine is for infusion into a vein. It is given by a health-care professional in a hospital or clinic. °Talk to your pediatrician regarding the use of this medicine in children. While this drug may be prescribed for selected conditions, precautions do apply. °Overdosage: If you think you have taken too much of this medicine contact a poison control center or emergency room at once. °NOTE: This medicine is only for you. Do not share this medicine with others. °What if I miss a dose? °This does not apply. °What may interact with this medicine? °Interactions are not expected. °This list may not describe all possible interactions. Give your health care provider a list of all the medicines, herbs, non-prescription drugs, or dietary supplements you use. Also tell them if you smoke, drink alcohol, or use illegal drugs. Some items may interact with your medicine. °What should I watch for while using this medicine? °Your condition will be closely monitored while you receive this medicine. °Some products are derived from human plasma, and there is a small risk that these products may contain  certain types of virus or bacteria. All products are processed to kill most viruses and bacteria. If you have questions concerning the risk of infections, discuss them with your doctor or health care professional. °What side effects may I notice from receiving this medicine? °Side effects that you should report to your doctor or health care professional as soon as possible: °-allergic reactions like skin rash, itching or hives, swelling of the face, lips, or tongue °-breathing problems °-changes in heartbeat °-fever, chills °-pain, redness or swelling at the injection site °-signs of viral infection including fever, drowsiness, chills, runny nose followed in about 2 weeks by a rash and joint pain °-tightness in the chest °Side effects that usually do not require medical attention (report to your doctor or health care professional if they continue or are bothersome): °-increased salivation °-nausea, vomiting °This list may not describe all possible side effects. Call your doctor for medical advice about side effects. You may report side effects to FDA at 1-800-FDA-1088. °Where should I keep my medicine? °This does not apply. You will not be given this medicine to store at home. °NOTE: This sheet is a summary. It may not cover all possible information. If you have questions about this medicine, talk to your doctor, pharmacist, or health care provider. °© 2018 [employer]vier/Gold Standard (2007-06-18 10:18:55) ° °

## 2017-02-25 NOTE — Procedures (Addendum)
Ultrasound-guided therapeutic paracentesis performed yielding 5.6  liters of yellow fluid. No immediate complications. The pt will receive 50 grams IV albumin postprocedure.

## 2017-03-05 ENCOUNTER — Telehealth: Payer: Self-pay

## 2017-03-05 NOTE — Telephone Encounter (Signed)
-----   Message from Algernon Huxley, RN sent at 02/19/2017  8:55 AM EST ----- Regarding: Labs Pt needs labs, order in epic

## 2017-03-05 NOTE — Telephone Encounter (Signed)
The patient has been notified of this information and all questions answered.

## 2017-03-11 ENCOUNTER — Ambulatory Visit (HOSPITAL_COMMUNITY)
Admission: RE | Admit: 2017-03-11 | Discharge: 2017-03-11 | Disposition: A | Discharge status: Home / Self Care | Payer: Self-pay | Source: Ambulatory Visit | Attending: Gastroenterology | Admitting: Gastroenterology

## 2017-03-11 ENCOUNTER — Ambulatory Visit (HOSPITAL_COMMUNITY): Payer: Self-pay

## 2017-03-11 ENCOUNTER — Encounter (HOSPITAL_COMMUNITY)
Admission: RE | Admit: 2017-03-11 | Discharge: 2017-03-11 | Disposition: A | Discharge status: Home / Self Care | Payer: Medicaid Other | Source: Ambulatory Visit | Attending: Gastroenterology | Admitting: Gastroenterology

## 2017-03-11 ENCOUNTER — Encounter (HOSPITAL_COMMUNITY): Payer: Self-pay

## 2017-03-11 DIAGNOSIS — R188 Other ascites: Secondary | ICD-10-CM

## 2017-03-11 DIAGNOSIS — K7031 Alcoholic cirrhosis of liver with ascites: Secondary | ICD-10-CM | POA: Insufficient documentation

## 2017-03-11 DIAGNOSIS — K7460 Unspecified cirrhosis of liver: Secondary | ICD-10-CM | POA: Insufficient documentation

## 2017-03-11 MED ORDER — ALBUMIN HUMAN 25 % IV SOLN
50.0000 g | Freq: Once | INTRAVENOUS | Status: AC
Start: 2017-03-11 — End: 2017-03-11
  Administered 2017-03-11: 50 g via INTRAVENOUS
  Filled 2017-03-11 (×2): qty 200

## 2017-03-11 MED ORDER — SODIUM CHLORIDE 0.9 % IV SOLN
INTRAVENOUS | Status: DC
  Administered 2017-03-11: 12:00:00 via INTRAVENOUS

## 2017-03-11 MED ORDER — LIDOCAINE HCL 2 % IJ SOLN
INTRAMUSCULAR | Status: AC
  Filled 2017-03-11: qty 10

## 2017-03-11 NOTE — Discharge Instructions (Signed)
Albumin injection °What is this medicine? °ALBUMIN (al BYOO min) is used to treat or prevent shock following serious injury, bleeding, surgery, or burns by increasing the volume of blood plasma. This medicine can also replace low blood protein. °This medicine may be used for other purposes; ask your health care provider or pharmacist if you have questions. °COMMON BRAND NAME(S): Albuked, Albumarc, Albuminar, AlbuRx, Albutein, Buminate, Flexbumin, Kedbumin, Macrotec, Plasbumin °What should I tell my health care provider before I take this medicine? °They need to know if you have any of the following conditions: °-anemia °-heart disease °-kidney disease °-an unusual or allergic reaction to albumin, other medicines, foods, dyes, or preservatives °-pregnant or trying to get pregnant °-breast-feeding °How should I use this medicine? °This medicine is for infusion into a vein. It is given by a health-care professional in a hospital or clinic. °Talk to your pediatrician regarding the use of this medicine in children. While this drug may be prescribed for selected conditions, precautions do apply. °Overdosage: If you think you have taken too much of this medicine contact a poison control center or emergency room at once. °NOTE: This medicine is only for you. Do not share this medicine with others. °What if I miss a dose? °This does not apply. °What may interact with this medicine? °Interactions are not expected. °This list may not describe all possible interactions. Give your health care provider a list of all the medicines, herbs, non-prescription drugs, or dietary supplements you use. Also tell them if you smoke, drink alcohol, or use illegal drugs. Some items may interact with your medicine. °What should I watch for while using this medicine? °Your condition will be closely monitored while you receive this medicine. °Some products are derived from human plasma, and there is a small risk that these products may contain  certain types of virus or bacteria. All products are processed to kill most viruses and bacteria. If you have questions concerning the risk of infections, discuss them with your doctor or health care professional. °What side effects may I notice from receiving this medicine? °Side effects that you should report to your doctor or health care professional as soon as possible: °-allergic reactions like skin rash, itching or hives, swelling of the face, lips, or tongue °-breathing problems °-changes in heartbeat °-fever, chills °-pain, redness or swelling at the injection site °-signs of viral infection including fever, drowsiness, chills, runny nose followed in about 2 weeks by a rash and joint pain °-tightness in the chest °Side effects that usually do not require medical attention (report to your doctor or health care professional if they continue or are bothersome): °-increased salivation °-nausea, vomiting °This list may not describe all possible side effects. Call your doctor for medical advice about side effects. You may report side effects to FDA at 1-800-FDA-1088. °Where should I keep my medicine? °This does not apply. You will not be given this medicine to store at home. °NOTE: This sheet is a summary. It may not cover all possible information. If you have questions about this medicine, talk to your doctor, pharmacist, or health care provider. °© 2018 [employer]vier/Gold Standard (2007-06-18 10:18:55) ° °

## 2017-03-11 NOTE — Procedures (Addendum)
Ultrasound-guided  therapeutic paracentesis performed yielding 7 liters (maximum ordered) of yellow fluid. No immediate complications. The pt will receive 50 grams IV albumin postprocedure.

## 2017-03-25 ENCOUNTER — Encounter (HOSPITAL_COMMUNITY)
Admission: RE | Admit: 2017-03-25 | Discharge: 2017-03-25 | Disposition: A | Discharge status: Home / Self Care | Payer: Medicaid Other | Source: Ambulatory Visit | Attending: Gastroenterology | Admitting: Gastroenterology

## 2017-03-25 ENCOUNTER — Other Ambulatory Visit: Payer: Self-pay | Admitting: Gastroenterology

## 2017-03-25 ENCOUNTER — Encounter (HOSPITAL_COMMUNITY): Payer: Self-pay

## 2017-03-25 ENCOUNTER — Ambulatory Visit (HOSPITAL_COMMUNITY)
Admission: RE | Admit: 2017-03-25 | Discharge: 2017-03-25 | Disposition: A | Discharge status: Home / Self Care | Payer: Self-pay | Source: Ambulatory Visit | Attending: Gastroenterology | Admitting: Gastroenterology

## 2017-03-25 DIAGNOSIS — R188 Other ascites: Secondary | ICD-10-CM | POA: Insufficient documentation

## 2017-03-25 MED ORDER — ALBUMIN HUMAN 25 % IV SOLN
50.0000 g | Freq: Once | INTRAVENOUS | Status: AC
Start: 2017-03-25 — End: 2017-03-25
  Administered 2017-03-25: 50 g via INTRAVENOUS
  Filled 2017-03-25 (×2): qty 200

## 2017-03-25 MED ORDER — LIDOCAINE HCL 1 % IJ SOLN
INTRAMUSCULAR | Status: AC
  Filled 2017-03-25: qty 20

## 2017-03-25 MED ORDER — SODIUM CHLORIDE 0.9 % IV SOLN
Freq: Once | INTRAVENOUS | Status: AC
  Administered 2017-03-25: 12:00:00 via INTRAVENOUS

## 2017-03-25 NOTE — Procedures (Signed)
Ultrasound-guided therapeutic paracentesis performed yielding 7 liters (maximum ordered) of yellow fluid. No immediate complications. She will receive 50 grams IV albumin post procedure.

## 2017-03-25 NOTE — Discharge Instructions (Signed)
Albumin injection °What is this medicine? °ALBUMIN (al BYOO min) is used to treat or prevent shock following serious injury, bleeding, surgery, or burns by increasing the volume of blood plasma. This medicine can also replace low blood protein. °This medicine may be used for other purposes; ask your health care provider or pharmacist if you have questions. °COMMON BRAND NAME(S): Albuked, Albumarc, Albuminar, AlbuRx, Albutein, Buminate, Flexbumin, Kedbumin, Macrotec, Plasbumin °What should I tell my health care provider before I take this medicine? °They need to know if you have any of the following conditions: °-anemia °-heart disease °-kidney disease °-an unusual or allergic reaction to albumin, other medicines, foods, dyes, or preservatives °-pregnant or trying to get pregnant °-breast-feeding °How should I use this medicine? °This medicine is for infusion into a vein. It is given by a health-care professional in a hospital or clinic. °Talk to your pediatrician regarding the use of this medicine in children. While this drug may be prescribed for selected conditions, precautions do apply. °Overdosage: If you think you have taken too much of this medicine contact a poison control center or emergency room at once. °NOTE: This medicine is only for you. Do not share this medicine with others. °What if I miss a dose? °This does not apply. °What may interact with this medicine? °Interactions are not expected. °This list may not describe all possible interactions. Give your health care provider a list of all the medicines, herbs, non-prescription drugs, or dietary supplements you use. Also tell them if you smoke, drink alcohol, or use illegal drugs. Some items may interact with your medicine. °What should I watch for while using this medicine? °Your condition will be closely monitored while you receive this medicine. °Some products are derived from human plasma, and there is a small risk that these products may contain  certain types of virus or bacteria. All products are processed to kill most viruses and bacteria. If you have questions concerning the risk of infections, discuss them with your doctor or health care professional. °What side effects may I notice from receiving this medicine? °Side effects that you should report to your doctor or health care professional as soon as possible: °-allergic reactions like skin rash, itching or hives, swelling of the face, lips, or tongue °-breathing problems °-changes in heartbeat °-fever, chills °-pain, redness or swelling at the injection site °-signs of viral infection including fever, drowsiness, chills, runny nose followed in about 2 weeks by a rash and joint pain °-tightness in the chest °Side effects that usually do not require medical attention (report to your doctor or health care professional if they continue or are bothersome): °-increased salivation °-nausea, vomiting °This list may not describe all possible side effects. Call your doctor for medical advice about side effects. You may report side effects to FDA at 1-800-FDA-1088. °Where should I keep my medicine? °This does not apply. You will not be given this medicine to store at home. °NOTE: This sheet is a summary. It may not cover all possible information. If you have questions about this medicine, talk to your doctor, pharmacist, or health care provider. °© 2018 [employer]vier/Gold Standard (2007-06-18 10:18:55) ° °

## 2017-03-27 ENCOUNTER — Other Ambulatory Visit: Payer: Self-pay | Admitting: Gastroenterology

## 2017-03-27 DIAGNOSIS — R188 Other ascites: Principal | ICD-10-CM

## 2017-03-27 DIAGNOSIS — K7460 Unspecified cirrhosis of liver: Secondary | ICD-10-CM

## 2017-04-10 ENCOUNTER — Ambulatory Visit (HOSPITAL_COMMUNITY): Payer: Medicaid Other

## 2017-04-10 ENCOUNTER — Ambulatory Visit (HOSPITAL_COMMUNITY): Admission: RE | Admit: 2017-04-10 | Payer: Medicaid Other | Source: Ambulatory Visit

## 2017-04-11 ENCOUNTER — Ambulatory Visit (HOSPITAL_COMMUNITY)
Admission: RE | Admit: 2017-04-11 | Discharge: 2017-04-11 | Disposition: A | Discharge status: Home / Self Care | Payer: Self-pay | Source: Ambulatory Visit | Attending: Gastroenterology | Admitting: Gastroenterology

## 2017-04-11 ENCOUNTER — Other Ambulatory Visit: Payer: Self-pay

## 2017-04-11 ENCOUNTER — Encounter (HOSPITAL_COMMUNITY): Payer: Self-pay | Admitting: General Surgery

## 2017-04-11 ENCOUNTER — Encounter: Payer: Self-pay | Admitting: Gastroenterology

## 2017-04-11 DIAGNOSIS — K7031 Alcoholic cirrhosis of liver with ascites: Secondary | ICD-10-CM | POA: Insufficient documentation

## 2017-04-11 HISTORY — PX: IR PARACENTESIS: IMG2679

## 2017-04-11 MED ORDER — ALBUMIN HUMAN 25 % IV SOLN
50.0000 g | Freq: Once | INTRAVENOUS | Status: AC
  Administered 2017-04-11: 50 g via INTRAVENOUS
  Filled 2017-04-11: qty 200

## 2017-04-11 MED ORDER — LIDOCAINE HCL (PF) 1 % IJ SOLN
INTRAMUSCULAR | Status: DC | PRN
  Administered 2017-04-11: 10 mL

## 2017-04-11 MED ORDER — LIDOCAINE HCL 1 % IJ SOLN
INTRAMUSCULAR | Status: AC
  Filled 2017-04-11: qty 20

## 2017-04-11 NOTE — Procedures (Signed)
PROCEDURE SUMMARY:  Successful US guided paracentesis from right lateral abdomen.  Yielded 8.4 liters of clear yellow fluid.  No immediate complications.  Pt tolerated well.   Ezrael Sam S Shatavia Santor PA-C 04/11/2017 3:58 PM

## 2017-04-24 ENCOUNTER — Telehealth: Payer: Self-pay | Admitting: Gastroenterology

## 2017-04-24 NOTE — Telephone Encounter (Signed)
Central scheduling has been advised to use the signed order as entered.

## 2017-04-24 NOTE — Telephone Encounter (Signed)
Left message on machine to call back to discuss orders for paracentesis

## 2017-04-25 ENCOUNTER — Other Ambulatory Visit: Payer: Self-pay

## 2017-04-25 ENCOUNTER — Telehealth: Payer: Self-pay | Admitting: Gastroenterology

## 2017-04-25 DIAGNOSIS — K7031 Alcoholic cirrhosis of liver with ascites: Secondary | ICD-10-CM

## 2017-04-25 NOTE — Telephone Encounter (Signed)
The order for paracentesis has been added to Epic.

## 2017-04-28 ENCOUNTER — Ambulatory Visit (HOSPITAL_COMMUNITY)
Admission: RE | Admit: 2017-04-28 | Discharge: 2017-04-28 | Disposition: A | Discharge status: Home / Self Care | Payer: Self-pay | Source: Ambulatory Visit | Attending: Gastroenterology | Admitting: Gastroenterology

## 2017-04-28 ENCOUNTER — Other Ambulatory Visit: Payer: Self-pay

## 2017-04-28 ENCOUNTER — Encounter (HOSPITAL_COMMUNITY): Payer: Self-pay

## 2017-04-28 ENCOUNTER — Ambulatory Visit (HOSPITAL_COMMUNITY): Admission: RE | Admit: 2017-04-28 | Payer: Self-pay | Source: Ambulatory Visit

## 2017-04-28 ENCOUNTER — Ambulatory Visit (HOSPITAL_COMMUNITY): Payer: Self-pay

## 2017-04-28 DIAGNOSIS — K7031 Alcoholic cirrhosis of liver with ascites: Secondary | ICD-10-CM

## 2017-04-28 DIAGNOSIS — R188 Other ascites: Secondary | ICD-10-CM | POA: Insufficient documentation

## 2017-04-28 DIAGNOSIS — K7460 Unspecified cirrhosis of liver: Secondary | ICD-10-CM | POA: Insufficient documentation

## 2017-04-28 HISTORY — DX: Dyspnea, unspecified: R06.00

## 2017-04-28 MED ORDER — ALBUMIN HUMAN 25 % IV SOLN
50.0000 g | Freq: Once | INTRAVENOUS | Status: AC
  Administered 2017-04-28: 50 g via INTRAVENOUS
  Filled 2017-04-28 (×2): qty 200

## 2017-04-28 MED ORDER — LIDOCAINE HCL 1 % IJ SOLN
INTRAMUSCULAR | Status: AC
  Filled 2017-04-28: qty 10

## 2017-04-28 MED ORDER — SODIUM CHLORIDE 0.9 % IV SOLN
INTRAVENOUS | Status: DC
  Administered 2017-04-28: 12:00:00 via INTRAVENOUS

## 2017-04-28 NOTE — Procedures (Signed)
Ultrasound-guided  therapeutic paracentesis performed yielding 7 liters (maximum ordered) of yellow fluid. No immediate complications.  The patient will receive IV albumin infusion post procedure.

## 2017-05-01 ENCOUNTER — Telehealth: Payer: Self-pay | Admitting: Gastroenterology

## 2017-05-01 ENCOUNTER — Other Ambulatory Visit: Payer: Self-pay | Admitting: Gastroenterology

## 2017-05-01 DIAGNOSIS — R188 Other ascites: Principal | ICD-10-CM

## 2017-05-01 DIAGNOSIS — K7460 Unspecified cirrhosis of liver: Secondary | ICD-10-CM

## 2017-05-01 NOTE — Telephone Encounter (Signed)
The pt has been advised that her order is in the system to have every 2 week paracentesis and she can schedule them directly.

## 2017-05-07 NOTE — Telephone Encounter (Signed)
Done

## 2017-05-12 ENCOUNTER — Ambulatory Visit (HOSPITAL_COMMUNITY)
Admission: RE | Admit: 2017-05-12 | Discharge: 2017-05-12 | Disposition: A | Discharge status: Home / Self Care | Payer: Self-pay | Source: Ambulatory Visit | Attending: Gastroenterology | Admitting: Gastroenterology

## 2017-05-12 ENCOUNTER — Encounter (HOSPITAL_COMMUNITY): Payer: Self-pay

## 2017-05-12 DIAGNOSIS — K7460 Unspecified cirrhosis of liver: Secondary | ICD-10-CM

## 2017-05-12 DIAGNOSIS — R188 Other ascites: Principal | ICD-10-CM

## 2017-05-12 MED ORDER — ALBUMIN HUMAN 25 % IV SOLN
50.0000 g | Freq: Once | INTRAVENOUS | Status: AC
  Administered 2017-05-12: 50 g via INTRAVENOUS
  Filled 2017-05-12 (×2): qty 200

## 2017-05-12 MED ORDER — LIDOCAINE HCL 1 % IJ SOLN
INTRAMUSCULAR | Status: AC
  Filled 2017-05-12: qty 10

## 2017-05-12 MED ORDER — SODIUM CHLORIDE 0.9 % IV SOLN
INTRAVENOUS | Status: DC
  Administered 2017-05-12: 12:00:00 via INTRAVENOUS

## 2017-05-12 NOTE — Progress Notes (Signed)
Patient in Short Stay for albumin post paracentesis. BP today around 180/100. Has been higher than that past visits. Patient stated she is calling MD today to refill BP med and she will inform him of what BP was today.

## 2017-05-12 NOTE — Procedures (Signed)
Ultrasound-guided  therapeutic paracentesis performed yielding 7 liters of yellow fluid. No immediate complications.The pt will receive 50 grams IV albumin postprocedure.

## 2017-05-20 ENCOUNTER — Other Ambulatory Visit: Payer: Self-pay | Admitting: Gastroenterology

## 2017-05-20 DIAGNOSIS — R188 Other ascites: Secondary | ICD-10-CM

## 2017-05-20 MED ORDER — HYDROXYZINE HCL 25 MG PO TABS: 25.0000 mg | ORAL_TABLET | Freq: Four times a day (QID) | ORAL | 3 refills | Status: DC | PRN

## 2017-05-20 NOTE — Telephone Encounter (Signed)
Patient notified that rx refill sent Order placed for Paracentesis.

## 2017-05-28 ENCOUNTER — Ambulatory Visit (HOSPITAL_COMMUNITY): Payer: Self-pay

## 2017-06-04 ENCOUNTER — Encounter (HOSPITAL_COMMUNITY): Payer: Self-pay

## 2017-06-04 ENCOUNTER — Emergency Department (HOSPITAL_COMMUNITY)
Admission: EM | Admit: 2017-06-04 | Discharge: 2017-06-04 | Disposition: A | Discharge status: Home / Self Care | Payer: Self-pay | Attending: Emergency Medicine | Admitting: Emergency Medicine

## 2017-06-04 DIAGNOSIS — I129 Hypertensive chronic kidney disease with stage 1 through stage 4 chronic kidney disease, or unspecified chronic kidney disease: Secondary | ICD-10-CM | POA: Insufficient documentation

## 2017-06-04 DIAGNOSIS — N183 Chronic kidney disease, stage 3 (moderate): Secondary | ICD-10-CM | POA: Insufficient documentation

## 2017-06-04 DIAGNOSIS — K7469 Other cirrhosis of liver: Secondary | ICD-10-CM | POA: Insufficient documentation

## 2017-06-04 DIAGNOSIS — Z79899 Other long term (current) drug therapy: Secondary | ICD-10-CM | POA: Insufficient documentation

## 2017-06-04 DIAGNOSIS — R188 Other ascites: Secondary | ICD-10-CM | POA: Insufficient documentation

## 2017-06-04 DIAGNOSIS — Z853 Personal history of malignant neoplasm of breast: Secondary | ICD-10-CM | POA: Insufficient documentation

## 2017-06-04 DIAGNOSIS — K7460 Unspecified cirrhosis of liver: Secondary | ICD-10-CM

## 2017-06-04 LAB — CBC WITH DIFFERENTIAL/PLATELET
BASOS ABS: 0 10*3/uL (ref 0.0–0.1)
BASOS PCT: 0 %
Eosinophils Absolute: 0.2 10*3/uL (ref 0.0–0.7)
Eosinophils Relative: 3 %
HEMATOCRIT: 33.9 % — AB (ref 36.0–46.0)
HEMOGLOBIN: 10.7 g/dL — AB (ref 12.0–15.0)
Lymphocytes Relative: 29 %
Lymphs Abs: 1.5 10*3/uL (ref 0.7–4.0)
MCH: 24.8 pg — ABNORMAL LOW (ref 26.0–34.0)
MCHC: 31.6 g/dL (ref 30.0–36.0)
MCV: 78.5 fL (ref 78.0–100.0)
Monocytes Absolute: 0.2 10*3/uL (ref 0.1–1.0)
Monocytes Relative: 4 %
NEUTROS ABS: 3.2 10*3/uL (ref 1.7–7.7)
NEUTROS PCT: 64 %
Platelets: 179 10*3/uL (ref 150–400)
RBC: 4.32 MIL/uL (ref 3.87–5.11)
RDW: 15.6 % — AB (ref 11.5–15.5)
WBC: 5.1 10*3/uL (ref 4.0–10.5)

## 2017-06-04 LAB — COMPREHENSIVE METABOLIC PANEL
ALK PHOS: 161 U/L — AB (ref 38–126)
ALT: 22 U/L (ref 14–54)
ANION GAP: 8 (ref 5–15)
AST: 35 U/L (ref 15–41)
Albumin: 3.6 g/dL (ref 3.5–5.0)
BILIRUBIN TOTAL: 0.7 mg/dL (ref 0.3–1.2)
BUN: 42 mg/dL — ABNORMAL HIGH (ref 6–20)
CALCIUM: 8.8 mg/dL — AB (ref 8.9–10.3)
CO2: 20 mmol/L — ABNORMAL LOW (ref 22–32)
Chloride: 106 mmol/L (ref 101–111)
Creatinine, Ser: 1.42 mg/dL — ABNORMAL HIGH (ref 0.44–1.00)
GFR calc non Af Amer: 39 mL/min — ABNORMAL LOW (ref >60)
GFR, EST AFRICAN AMERICAN: 45 mL/min — AB (ref >60)
Glucose, Bld: 109 mg/dL — ABNORMAL HIGH (ref 65–99)
Potassium: 5.3 mmol/L — ABNORMAL HIGH (ref 3.5–5.1)
Sodium: 134 mmol/L — ABNORMAL LOW (ref 135–145)
TOTAL PROTEIN: 8.6 g/dL — AB (ref 6.5–8.1)

## 2017-06-04 LAB — PROTIME-INR
INR: 1
Prothrombin Time: 13.1 seconds (ref 11.4–15.2)

## 2017-06-04 MED ORDER — FENTANYL CITRATE (PF) 100 MCG/2ML IJ SOLN
50.0000 ug | Freq: Once | INTRAMUSCULAR | Status: AC
  Administered 2017-06-04: 50 ug via INTRAVENOUS
  Filled 2017-06-04: qty 2

## 2017-06-04 MED ORDER — FUROSEMIDE 40 MG PO TABS
40.0000 mg | ORAL_TABLET | Freq: Once | ORAL | Status: AC
  Administered 2017-06-04: 40 mg via ORAL
  Filled 2017-06-04: qty 1

## 2017-06-04 NOTE — ED Provider Notes (Signed)
[facility] DEPT Provider Note   CSN: [id number] Arrival date & time: 06/04/17  0030     History   Chief Complaint Chief Complaint  Patient presents with  . Abdominal Pain    HPI Alicia [name] is a 62 y.o. female.  The history is provided by the patient.  Abdominal Pain   This is a recurrent problem. The current episode started more than 2 days ago. The problem occurs daily. The problem has been gradually worsening. The pain is located in the generalized abdominal region. The pain is moderate. Pertinent negatives include fever, diarrhea, nausea and vomiting. The symptoms are aggravated by certain positions. Relieved by: rest.   With known history of cirrhosis presents with abdominal distention and pain.  She reports she feels that she has too much fluid in her abdomen.  Patient reports she is a gets paracentesis every 2 weeks, but due to insurance issues she has not had one since February 4.  No fever/vomiting.  No chest pain.  She does feel short of breath due to abdominal distention. She reports this feels like previous episodes. Past Medical History:  Diagnosis Date  . Ascites   . Breast cancer ([place])   . Cancer ([place])   . Chronic anticoagulation   . Cirrhosis ([place])   . Dyspnea    with fluid buildup   . GERD (gastroesophageal reflux disease)   . Hypertension   . Hypotension     Patient Active Problem List   Diagnosis Date Noted  . Cirrhosis of liver with ascites ([place])   . Secondary esophageal varices without bleeding ([place])   . Alcoholic cirrhosis of liver with ascites ([place]) 08/08/2016  . HTN (hypertension) 08/08/2016  . Anemia in other chronic diseases classified elsewhere 08/08/2016  . CKD (chronic kidney disease) stage 3, GFR 30-59 ml/min (HCC) 08/08/2016  . Hypokalemia 08/08/2016  . Gastroesophageal reflux disease   . Portal hypertension ([place])   . Ascites 08/07/2016    Past Surgical History:  Procedure Laterality Date  .  BREAST LUMPECTOMY    . BREAST SURGERY    . CESAREAN SECTION    . ESOPHAGOGASTRODUODENOSCOPY (EGD) WITH PROPOFOL N/A 11/21/2016   Procedure: ESOPHAGOGASTRODUODENOSCOPY;  Surgeon: Milus Banister, MD;  Location: [facility] ENDOSCOPY;  Service: Endoscopy;  Laterality: N/A;  . IR PARACENTESIS  09/04/2016  . IR PARACENTESIS  09/20/2016  . IR PARACENTESIS  10/22/2016  . IR PARACENTESIS  01/27/2017  . IR PARACENTESIS  04/11/2017  . LYMPH NODE DISSECTION      OB History    No data available       Home Medications    Prior to Admission medications   Medication Sig Start Date End Date Taking? Authorizing Provider  acetaminophen (TYLENOL) 500 MG tablet Take 1,000 mg by mouth every 8 (eight) hours as needed for mild pain.    [provider]  amLODipine (NORVASC) 5 MG tablet Take 1 tablet (5 mg total) by mouth daily. 08/09/16   Barton Dubois, MD  aspirin-acetaminophen-caffeine (EXCEDRIN MIGRAINE) [phone] MG tablet Take 2 tablets by mouth every 8 (eight) hours as needed for headache.     [provider]  furosemide (LASIX) 40 MG tablet Take 1.5 tablets (60 mg total) by mouth daily. Take 1 1/2 tabs daily 12/11/16 01/10/17  Milus Banister, MD  hydrALAZINE (APRESOLINE) 25 MG tablet Take 1 tablet (25 mg total) by mouth 2 (two) times daily. Patient taking differently: Take 25 mg by mouth 3 (three) times daily.  08/08/16  Barton Dubois, MD  hydrOXYzine (ATARAX/VISTARIL) 25 MG tablet Take 1 tablet (25 mg total) by mouth every 6 (six) hours as needed for itching. 05/20/17   Milus Banister, MD  magnesium oxide (MAG-OX) 400 MG tablet Take 400 mg by mouth daily.    [provider]  nadolol (CORGARD) 40 MG tablet Take 1 tablet (40 mg total) by mouth daily. 11/21/16   Milus Banister, MD  omeprazole (PRILOSEC) 40 MG capsule Take 1 capsule (40 mg total) by mouth daily. 08/08/16   Barton Dubois, MD  spironolactone (ALDACTONE) 50 MG tablet Take 1.5 tablets (75 mg total) by mouth 2 (two) times  daily. 12/11/16 01/10/17  Milus Banister, MD    Family History Family History  Problem Relation Age of Onset  . Diabetes Mother   . Diabetes Father   . Diabetes Brother   . Colon cancer Neg Hx   . Stomach cancer Neg Hx   . Esophageal cancer Neg Hx     Social History Social History   Tobacco Use  . Smoking status: Never Smoker  . Smokeless tobacco: Never Used  Substance Use Topics  . Alcohol use: No    Comment: hx of   . Drug use: No     Allergies   Compazine [prochlorperazine edisylate] and Meclizine   Review of Systems Review of Systems  Constitutional: Negative for fever.  Gastrointestinal: Positive for abdominal pain. Negative for diarrhea, nausea and vomiting.  All other systems reviewed and are negative.    Physical Exam Updated Vital Signs BP (!) 205/107 (BP Location: Left Arm)   Pulse 98   Temp 98.4 F (36.9 C) (Oral)   Resp 18   SpO2 100%   Physical Exam  CONSTITUTIONAL: elderly HEAD: Normocephalic/atraumatic EYES: EOMI/PERRL ENMT: Mucous membranes moist NECK: supple no meningeal signs SPINE/BACK:entire spine nontender CV: S1/S2 noted, no murmurs/rubs/gallops noted LUNGS: Lungs are clear to auscultation bilaterally, no apparent distress ABDOMEN: soft, distended, mild diffuse tenderness GU:no cva tenderness NEURO: Pt is awake/alert/appropriate, moves all extremitiesx4.  No facial droop.   EXTREMITIES: pulses normal/equal, full ROM SKIN: warm, color normal PSYCH: no abnormalities of mood noted, alert and oriented to situation  ED Treatments / Results  Labs (all labs ordered are listed, but only abnormal results are displayed) Labs Reviewed  COMPREHENSIVE METABOLIC PANEL - Abnormal; Notable for the following components:      Result Value   Sodium 134 (*)    Potassium 5.3 (*)    CO2 20 (*)    Glucose, Bld 109 (*)    BUN 42 (*)    Creatinine, Ser 1.42 (*)    Calcium 8.8 (*)    Total Protein 8.6 (*)    Alkaline Phosphatase 161 (*)     GFR calc non Af Amer 39 (*)    GFR calc Af Amer 45 (*)    All other components within normal limits  CBC WITH DIFFERENTIAL/PLATELET - Abnormal; Notable for the following components:   Hemoglobin 10.7 (*)    HCT 33.9 (*)    MCH 24.8 (*)    RDW 15.6 (*)    All other components within normal limits  PROTIME-INR    EKG  EKG Interpretation None       Radiology No results found.  Procedures Procedures (including critical care time)  Medications Ordered in ED Medications  furosemide (LASIX) tablet 40 mg (not administered)  fentaNYL (SUBLIMAZE) injection 50 mcg (50 mcg Intravenous Given 06/04/17 0248)     Initial Impression /  Assessment and Plan / ED Course  I have reviewed the triage vital signs and the nursing notes.  Pertinent labs   results that were available during my care of the patient were reviewed by me and considered in my medical decision making (see chart for details).     2:20 AM She with known history of cirrhosis requires paracentesis every 2 weeks.  She has not had one since February 4 due to insurance issues.  Labs pending at this time.   my suspicion for SBP is low for other acute abdominal emergency 5:45 AM Rested comfortably overnight.  She is in no distress.  She denies any pain at this time.  No fever or vomiting.  Labs have  been reviewed.  I have given her an extra dose of Lasix.  She reports she takes Lasix daily.  Will attempt to arrange an outpatient paracentesis later today.  This is typically done via IR at this hospital.  This was discussed with patient.  I do not feel she requires admission at this time, as this is been a slowly worsening process and she is supposed to have this already done. Final Clinical Impressions(s) / ED Diagnoses   Final diagnoses:  Ascites  Cirrhosis of liver with ascites, unspecified hepatic cirrhosis type [facility])    ED Discharge Orders    None       Ripley Fraise, MD 06/04/17 [phone]

## 2017-06-04 NOTE — Discharge Instructions (Signed)
You  should be receiving a phone call later today for an appointment to have a paracentesis today If you have any new fever above 100, vomiting, worsening abdominal pain, go immediately to the ER Be sure to take your Lasix every day

## 2017-06-04 NOTE — ED Triage Notes (Signed)
Pt has cirrhosis and needs her abdomen drained, she's behind on her appts She also states that her blood pressure is elevated

## 2017-06-05 ENCOUNTER — Emergency Department (HOSPITAL_COMMUNITY)
Admission: EM | Admit: 2017-06-05 | Discharge: 2017-06-05 | Disposition: A | Discharge status: Home / Self Care | Payer: Self-pay | Attending: Emergency Medicine | Admitting: Emergency Medicine

## 2017-06-05 ENCOUNTER — Encounter (HOSPITAL_COMMUNITY): Payer: Self-pay | Admitting: Emergency Medicine

## 2017-06-05 ENCOUNTER — Ambulatory Visit (HOSPITAL_COMMUNITY)
Admission: RE | Admit: 2017-06-05 | Discharge: 2017-06-05 | Disposition: A | Discharge status: Home / Self Care | Payer: Self-pay | Source: Ambulatory Visit | Attending: Gastroenterology | Admitting: Gastroenterology

## 2017-06-05 ENCOUNTER — Emergency Department (HOSPITAL_COMMUNITY): Payer: Self-pay

## 2017-06-05 DIAGNOSIS — K7031 Alcoholic cirrhosis of liver with ascites: Secondary | ICD-10-CM | POA: Insufficient documentation

## 2017-06-05 DIAGNOSIS — R188 Other ascites: Secondary | ICD-10-CM

## 2017-06-05 DIAGNOSIS — I129 Hypertensive chronic kidney disease with stage 1 through stage 4 chronic kidney disease, or unspecified chronic kidney disease: Secondary | ICD-10-CM | POA: Insufficient documentation

## 2017-06-05 DIAGNOSIS — I9589 Other hypotension: Secondary | ICD-10-CM

## 2017-06-05 DIAGNOSIS — Z853 Personal history of malignant neoplasm of breast: Secondary | ICD-10-CM | POA: Insufficient documentation

## 2017-06-05 DIAGNOSIS — Z79899 Other long term (current) drug therapy: Secondary | ICD-10-CM | POA: Insufficient documentation

## 2017-06-05 DIAGNOSIS — N183 Chronic kidney disease, stage 3 (moderate): Secondary | ICD-10-CM | POA: Insufficient documentation

## 2017-06-05 DIAGNOSIS — K7460 Unspecified cirrhosis of liver: Secondary | ICD-10-CM

## 2017-06-05 HISTORY — PX: IR PARACENTESIS: IMG2679

## 2017-06-05 LAB — BODY FLUID CELL COUNT WITH DIFFERENTIAL
EOS FL: 0 %
LYMPHS FL: 31 %
Monocyte-Macrophage-Serous Fluid: 68 % (ref 50–90)
Neutrophil Count, Fluid: 1 % (ref 0–25)
Total Nucleated Cell Count, Fluid: 248 cu mm (ref 0–1000)

## 2017-06-05 LAB — GRAM STAIN

## 2017-06-05 MED ORDER — LIDOCAINE 2% (20 MG/ML) 5 ML SYRINGE
INTRAMUSCULAR | Status: AC
  Filled 2017-06-05: qty 10

## 2017-06-05 MED ORDER — LIDOCAINE HCL (PF) 2 % IJ SOLN
INTRAMUSCULAR | Status: AC | PRN
  Administered 2017-06-05: 10 mL

## 2017-06-05 MED ORDER — ALBUMIN HUMAN 25 % IV SOLN
50.0000 g | Freq: Once | INTRAVENOUS | Status: AC
  Administered 2017-06-05: 50 g via INTRAVENOUS
  Filled 2017-06-05: qty 200

## 2017-06-05 MED ORDER — SODIUM CHLORIDE 0.9 % IV BOLUS (SEPSIS)
500.0000 mL | Freq: Once | INTRAVENOUS | Status: AC
  Administered 2017-06-05: 500 mL via INTRAVENOUS

## 2017-06-05 NOTE — Procedures (Addendum)
PROCEDURE SUMMARY:  Patient arrived to radiology department today for paracentesis.  She states she was seen in the ED overnight due to ascites and abdominal pain.  She was discharged home with outpatient appointment.  Upon arrival to our unit, patient was diaphoretic and extremely short of breath.  She vomited x1 and BM x2.  She has a significant fluid burden which is causing shortness of breath and discomfort.  Her initial BP was 97/50 with HR of 101, but decreased this decreased to SBP of 81 when laid flat for procedure.  Temp 98.5 O2 97%.  Mentating well.   Successful US guided paracentesis from right lateral abdomen.  Yielded 120 mL of pale, yellow fluid.  Her SBP decreased to 72. Procedure was stopped.  Patient mentating well during procedure. Initiated fluid bolus.  Pt tolerated well.   Specimen was sent for labs.  She is getting 50g albumin.   Plan to send to the ED vs. Direct admit for ongoing assessment and stabilization.   Docia Barrier PA-C 06/05/2017 11:16 AM

## 2017-06-05 NOTE — Progress Notes (Signed)
Spoke with Santiago Glad, RN in ED for this pt.  Santiago Glad RN states that she has discharged pt from ED and given necessary instructions and PA advised her (Karen)for [place] to do paracentesis and discharge pt from radiology as if she came in as an outpatient for paracentesis.

## 2017-06-05 NOTE — Procedures (Signed)
PROCEDURE SUMMARY:  Paracentesis requested by ED. Patient has improved over the course of the day with rest and hydration.  She is now feeling much better and her vital signs are much improved.   Successful US guided paracentesis from right lateral abdomen.  Yielded 7.0 liters of yellow fluid.  No immediate complications.  Pt tolerated well.   Specimen was not sent for labs.  Docia Barrier PA-C 06/05/2017 2:59 PM

## 2017-06-05 NOTE — ED Notes (Signed)
Pt transferred to IR to complete paracentesis

## 2017-06-05 NOTE — ED Triage Notes (Signed)
Pt transferred from IR from outpatient paracentesis-- on beginning of procedure, pt's BP dropped to 84/52-- -- pt transported here on stretcher-- IV NS infusing in left forearm has received approx 300cc.

## 2017-06-05 NOTE — ED Provider Notes (Signed)
[facility] EMERGENCY DEPARTMENT Provider Note   CSN: [id number] Arrival date & time: 06/05/17  1221     History   Chief Complaint Chief Complaint  Patient presents with  . Hypotension    HPI Alicia [name] is a 62 y.o. female.  HPI Patient presents from the procedure suite, with concern of hypotension, weakness. Patient notes that she has twice monthly paracentesis due to cirrhosis. Last paracentesis was 24 days ago, and the patient has not had the procedure done due to change in insurance status. Patient went to our facility facility 36 hours ago, was evaluated, monitored, and discharged in stable condition with arrangement for paracentesis today. Today, while preparing for the procedure, patient was found have hypotension, without syncope, pain, nausea, vomiting, incontinence or other complaints. She does recall feeling slightly weak.   Past Medical History:  Diagnosis Date  . Ascites   . Breast cancer ([place])   . Cancer ([place])   . Chronic anticoagulation   . Cirrhosis ([place])   . Dyspnea    with fluid buildup   . GERD (gastroesophageal reflux disease)   . Hypertension   . Hypotension     Patient Active Problem List   Diagnosis Date Noted  . Cirrhosis of liver with ascites ([place])   . Secondary esophageal varices without bleeding ([place])   . Alcoholic cirrhosis of liver with ascites ([place]) 08/08/2016  . HTN (hypertension) 08/08/2016  . Anemia in other chronic diseases classified elsewhere 08/08/2016  . CKD (chronic kidney disease) stage 3, GFR 30-59 ml/min (HCC) 08/08/2016  . Hypokalemia 08/08/2016  . Gastroesophageal reflux disease   . Portal hypertension ([place])   . Ascites 08/07/2016    Past Surgical History:  Procedure Laterality Date  . BREAST LUMPECTOMY    . BREAST SURGERY    . CESAREAN SECTION    . ESOPHAGOGASTRODUODENOSCOPY (EGD) WITH PROPOFOL N/A 11/21/2016   Procedure: ESOPHAGOGASTRODUODENOSCOPY;  Surgeon: Milus Banister, MD;   Location: [facility] END[facility];  Service: Endoscopy;  Laterality: N/A;  . IR PARACENTESIS  09/04/2016  . IR PARACENTESIS  09/20/2016  . IR PARACENTESIS  10/22/2016  . IR PARACENTESIS  01/27/2017  . IR PARACENTESIS  04/11/2017  . LYMPH NODE DISSECTION      OB History    No data available       Home Medications    Prior to Admission medications   Medication Sig Start Date End Date Taking? Authorizing Provider  acetaminophen (TYLENOL) 500 MG tablet Take 1,000 mg by mouth every 8 (eight) hours as needed for mild pain.    [provider]  amLODipine (NORVASC) 5 MG tablet Take 1 tablet (5 mg total) by mouth daily. 08/09/16   Barton Dubois, MD  aspirin-acetaminophen-caffeine (EXCEDRIN MIGRAINE) [phone] MG tablet Take 2 tablets by mouth every 8 (eight) hours as needed for headache.     [provider]  diphenhydrAMINE (BENADRYL) 25 MG tablet Take 25 mg by mouth every 6 (six) hours as needed for itching.    [provider]  furosemide (LASIX) 40 MG tablet Take 1.5 tablets (60 mg total) by mouth daily. Take 1 1/2 tabs daily Patient taking differently: Take 60 mg by mouth daily.  12/11/16 01/10/17  Milus Banister, MD  hydrALAZINE (APRESOLINE) 25 MG tablet Take 1 tablet (25 mg total) by mouth 2 (two) times daily. Patient taking differently: Take 25 mg by mouth 3 (three) times daily.  08/08/16   Barton Dubois, MD  hydrOXYzine (ATARAX/VISTARIL) 25 MG tablet Take 1 tablet (  25 mg total) by mouth every 6 (six) hours as needed for itching. 05/20/17   Milus Banister, MD  magnesium oxide (MAG-OX) 400 MG tablet Take 400 mg by mouth daily.    [provider]  nadolol (CORGARD) 40 MG tablet Take 1 tablet (40 mg total) by mouth daily. Patient not taking: Reported on 06/04/2017 11/21/16   Milus Banister, MD  omeprazole (PRILOSEC) 40 MG capsule Take 1 capsule (40 mg total) by mouth daily. Patient taking differently: Take 40 mg by mouth daily as needed (Heart burn).  08/08/16   Barton Dubois, MD  spironolactone (ALDACTONE) 50 MG tablet Take 1.5 tablets (75 mg total) by mouth 2 (two) times daily. 12/11/16 06/04/17  Milus Banister, MD    Family History Family History  Problem Relation Age of Onset  . Diabetes Mother   . Diabetes Father   . Diabetes Brother   . Colon cancer Neg Hx   . Stomach cancer Neg Hx   . Esophageal cancer Neg Hx     Social History Social History   Tobacco Use  . Smoking status: Never Smoker  . Smokeless tobacco: Never Used  Substance Use Topics  . Alcohol use: No    Comment: hx of   . Drug use: No     Allergies   Compazine [prochlorperazine edisylate] and Meclizine   Review of Systems Review of Systems  Constitutional:       Per HPI, otherwise negative  HENT:       Per HPI, otherwise negative  Respiratory:       Per HPI, otherwise negative  Cardiovascular:       Per HPI, otherwise negative  Gastrointestinal: Positive for abdominal distention and abdominal pain. Negative for vomiting.  Endocrine:       Negative aside from HPI  Genitourinary:       Neg aside from HPI   Musculoskeletal:       Per HPI, otherwise negative  Skin: Negative.   Neurological: Positive for weakness. Negative for syncope.     Physical Exam Updated Vital Signs BP 129/68   Pulse 97   Temp 98.4 F (36.9 C) (Oral)   Resp (!) 22   SpO2 97%   Physical Exam  Constitutional: She is oriented to person, place, and time. She has a sickly appearance. No distress.  HENT:  Head: Normocephalic and atraumatic.  Eyes: Conjunctivae and EOM are normal.  Cardiovascular: Normal rate and regular rhythm.  Pulmonary/Chest: Effort normal and breath sounds normal. No stridor. No respiratory distress.  Abdominal: She exhibits no distension. There is no tenderness. There is no guarding.  Musculoskeletal: She exhibits no edema.  Neurological: She is alert and oriented to person, place, and time. No cranial nerve deficit.  Skin: Skin is warm and dry.    Psychiatric: She has a normal mood and affect.  Nursing note and vitals reviewed.    ED Treatments / Results    After the initial evaluation I reviewed the patient's chart. Last paracentesis had drainage of approximately 7 L fluid. ED evaluation 36 hours ago was reassuring, similar to today's physical exam. Patient's blood pressure, after receiving fluid resuscitation has normalized.  Initial Impression / Assessment and Plan / ED Course  I have reviewed the triage vital signs and the nursing notes.  Pertinent labs & imaging results that were available during my care of the patient were reviewed by me and considered in my medical decision making (see chart for details).  2:05  PM Patient awake alert, in no distress, speaking, smiling, we discussed her case with our interventional radiology colleagues to facilitate paracentesis. With no persistent hypotension, no fever, no distress, no complaints, patient is appropriate for this procedure, otherwise reassuring findings suggest she is appropriate for discharge following it as well.  Final Clinical Impressions(s) / ED Diagnoses  Cirrhosis   Carmin Muskrat, MD 06/05/17 1407

## 2017-06-05 NOTE — Progress Notes (Signed)
Patient discharged following paracentesis and albumin. Patient awake and alert and states that she feels good.  Discharged in wheelchair to main entrance.

## 2017-06-06 ENCOUNTER — Other Ambulatory Visit: Payer: Self-pay

## 2017-06-06 ENCOUNTER — Other Ambulatory Visit: Payer: Self-pay | Admitting: Gastroenterology

## 2017-06-06 ENCOUNTER — Telehealth: Payer: Self-pay | Admitting: Gastroenterology

## 2017-06-06 DIAGNOSIS — K7031 Alcoholic cirrhosis of liver with ascites: Secondary | ICD-10-CM

## 2017-06-06 MED ORDER — HYDROXYZINE HCL 25 MG PO TABS: 25.0000 mg | ORAL_TABLET | Freq: Four times a day (QID) | ORAL | 0 refills | Status: DC | PRN

## 2017-06-06 MED ORDER — FUROSEMIDE 40 MG PO TABS: 60.0000 mg | ORAL_TABLET | Freq: Every day | ORAL | 0 refills | Status: DC

## 2017-06-06 NOTE — Telephone Encounter (Signed)
1 month refills sent pt needs to have appt for further refills

## 2017-06-07 ENCOUNTER — Telehealth (HOSPITAL_BASED_OUTPATIENT_CLINIC_OR_DEPARTMENT_OTHER): Payer: Self-pay | Admitting: Emergency Medicine

## 2017-06-09 LAB — CULTURE, BODY FLUID-BOTTLE

## 2017-06-09 LAB — CULTURE, BODY FLUID W GRAM STAIN -BOTTLE

## 2017-06-11 ENCOUNTER — Other Ambulatory Visit: Payer: Self-pay | Admitting: Gastroenterology

## 2017-06-11 DIAGNOSIS — K7031 Alcoholic cirrhosis of liver with ascites: Secondary | ICD-10-CM

## 2017-06-12 ENCOUNTER — Other Ambulatory Visit: Payer: Self-pay

## 2017-06-12 MED ORDER — TETRACYCLINE HCL 500 MG PO CAPS: 500.0000 mg | ORAL_CAPSULE | Freq: Two times a day (BID) | ORAL | 0 refills | Status: AC

## 2017-06-19 ENCOUNTER — Encounter (HOSPITAL_COMMUNITY): Payer: Self-pay | Admitting: Interventional Radiology

## 2017-06-19 ENCOUNTER — Ambulatory Visit (HOSPITAL_COMMUNITY): Payer: Self-pay

## 2017-06-19 ENCOUNTER — Ambulatory Visit (HOSPITAL_COMMUNITY)
Admission: RE | Admit: 2017-06-19 | Discharge: 2017-06-19 | Disposition: A | Discharge status: Home / Self Care | Payer: Self-pay | Source: Ambulatory Visit | Attending: Gastroenterology | Admitting: Gastroenterology

## 2017-06-19 DIAGNOSIS — K7031 Alcoholic cirrhosis of liver with ascites: Secondary | ICD-10-CM

## 2017-06-19 DIAGNOSIS — R188 Other ascites: Secondary | ICD-10-CM | POA: Insufficient documentation

## 2017-06-19 HISTORY — PX: IR PARACENTESIS: IMG2679

## 2017-06-19 MED ORDER — LIDOCAINE HCL 2 % IJ SOLN
INTRAMUSCULAR | Status: DC | PRN
  Administered 2017-06-19: 10 mL

## 2017-06-19 MED ORDER — LIDOCAINE 2% (20 MG/ML) 5 ML SYRINGE
INTRAMUSCULAR | Status: AC
  Filled 2017-06-19: qty 10

## 2017-06-19 MED ORDER — ALBUMIN HUMAN 25 % IV SOLN
50.0000 g | INTRAVENOUS | Status: DC
  Administered 2017-06-19: 50 g via INTRAVENOUS
  Filled 2017-06-19: qty 200

## 2017-06-19 NOTE — Procedures (Signed)
PROCEDURE SUMMARY:  Successful US guided paracentesis from right lateral abdomen.  Yielded 5.1 liters of yellow fluid.  No immediate complications.  Pt tolerated well.   Specimen was not sent for labs.  Docia Barrier PA-C 06/19/2017 1:57 PM

## 2017-07-03 ENCOUNTER — Ambulatory Visit (HOSPITAL_COMMUNITY)
Admission: RE | Admit: 2017-07-03 | Discharge: 2017-07-03 | Disposition: A | Discharge status: Home / Self Care | Payer: Self-pay | Source: Ambulatory Visit | Attending: Gastroenterology | Admitting: Gastroenterology

## 2017-07-03 ENCOUNTER — Encounter (HOSPITAL_COMMUNITY): Payer: Self-pay | Admitting: Student

## 2017-07-03 DIAGNOSIS — R188 Other ascites: Secondary | ICD-10-CM | POA: Insufficient documentation

## 2017-07-03 DIAGNOSIS — K7031 Alcoholic cirrhosis of liver with ascites: Secondary | ICD-10-CM

## 2017-07-03 HISTORY — PX: IR PARACENTESIS: IMG2679

## 2017-07-03 MED ORDER — LIDOCAINE HCL (PF) 2 % IJ SOLN
INTRAMUSCULAR | Status: AC
  Filled 2017-07-03: qty 20

## 2017-07-03 MED ORDER — LIDOCAINE HCL (PF) 2 % IJ SOLN
INTRAMUSCULAR | Status: DC | PRN
  Administered 2017-07-03: 5 mL

## 2017-07-03 MED ORDER — ALBUMIN HUMAN 25 % IV SOLN
50.0000 g | Freq: Once | INTRAVENOUS | Status: AC
Start: 2017-07-03 — End: 2017-07-03
  Administered 2017-07-03: 50 g via INTRAVENOUS
  Filled 2017-07-03: qty 200

## 2017-07-03 NOTE — Procedures (Signed)
PROCEDURE SUMMARY:  Successful US guided paracentesis from right lateral abdomen.  Yielded 6.0 liters of yellow fluid.  No immediate complications.  Pt tolerated well.   Specimen was not sent for labs.  Docia Barrier PA-C 07/03/2017 3:06 PM

## 2017-07-17 ENCOUNTER — Encounter (HOSPITAL_COMMUNITY): Payer: Self-pay | Admitting: Student

## 2017-07-17 ENCOUNTER — Ambulatory Visit (HOSPITAL_COMMUNITY)
Admission: RE | Admit: 2017-07-17 | Discharge: 2017-07-17 | Disposition: A | Discharge status: Home / Self Care | Payer: Self-pay | Source: Ambulatory Visit | Attending: Gastroenterology | Admitting: Gastroenterology

## 2017-07-17 DIAGNOSIS — K7031 Alcoholic cirrhosis of liver with ascites: Secondary | ICD-10-CM

## 2017-07-17 DIAGNOSIS — R188 Other ascites: Secondary | ICD-10-CM | POA: Insufficient documentation

## 2017-07-17 HISTORY — PX: IR PARACENTESIS: IMG2679

## 2017-07-17 MED ORDER — LIDOCAINE HCL (PF) 2 % IJ SOLN
INTRAMUSCULAR | Status: AC
  Filled 2017-07-17: qty 20

## 2017-07-17 MED ORDER — LIDOCAINE HCL (PF) 2 % IJ SOLN
INTRAMUSCULAR | Status: DC | PRN
  Administered 2017-07-17: 10 mL

## 2017-07-17 MED ORDER — ALBUMIN HUMAN 25 % IV SOLN
50.0000 g | Freq: Once | INTRAVENOUS | Status: AC
Start: 2017-07-17 — End: 2017-07-17
  Administered 2017-07-17: 50 g via INTRAVENOUS
  Filled 2017-07-17: qty 200

## 2017-07-17 NOTE — Procedures (Signed)
PROCEDURE SUMMARY:  Successful US guided paracentesis from right lateral abdomen.  Yielded 5.0 liters of clear, yellow fluid.  No immediate complications.  Pt tolerated well.   Specimen was not sent for labs.  Docia Barrier PA-C 07/17/2017 2:27 PM

## 2017-08-01 ENCOUNTER — Ambulatory Visit (HOSPITAL_COMMUNITY)
Admission: RE | Admit: 2017-08-01 | Discharge: 2017-08-01 | Disposition: A | Discharge status: Home / Self Care | Payer: Self-pay | Source: Ambulatory Visit | Attending: Gastroenterology | Admitting: Gastroenterology

## 2017-08-01 ENCOUNTER — Encounter (HOSPITAL_COMMUNITY): Payer: Self-pay | Admitting: Radiology

## 2017-08-01 DIAGNOSIS — R188 Other ascites: Secondary | ICD-10-CM | POA: Insufficient documentation

## 2017-08-01 DIAGNOSIS — K7031 Alcoholic cirrhosis of liver with ascites: Secondary | ICD-10-CM

## 2017-08-01 HISTORY — PX: IR PARACENTESIS: IMG2679

## 2017-08-01 MED ORDER — ALBUMIN HUMAN 25 % IV SOLN
50.0000 g | Freq: Once | INTRAVENOUS | Status: AC
  Administered 2017-08-01: 50 g via INTRAVENOUS
  Filled 2017-08-01: qty 200

## 2017-08-01 MED ORDER — LIDOCAINE HCL 1 % IJ SOLN
INTRAMUSCULAR | Status: AC
  Filled 2017-08-01: qty 20

## 2017-08-01 MED ORDER — LIDOCAINE HCL (PF) 1 % IJ SOLN
INTRAMUSCULAR | Status: DC | PRN
  Administered 2017-08-01: 10 mL

## 2017-08-01 NOTE — Procedures (Signed)
PROCEDURE SUMMARY:  Successful US guided paracentesis from RLQ.  Yielded 5.1 L of clear yellow fluid.  No immediate complications.  Pt tolerated well.   Specimen was not sent for labs.  Ascencion Dike PA-C 08/01/2017 11:02 AM

## 2017-08-14 ENCOUNTER — Ambulatory Visit (HOSPITAL_COMMUNITY)
Admission: RE | Admit: 2017-08-14 | Discharge: 2017-08-14 | Disposition: A | Discharge status: Home / Self Care | Payer: Self-pay | Source: Ambulatory Visit | Attending: Gastroenterology | Admitting: Gastroenterology

## 2017-08-14 ENCOUNTER — Encounter (HOSPITAL_COMMUNITY): Payer: Self-pay | Admitting: Radiology

## 2017-08-14 DIAGNOSIS — K7031 Alcoholic cirrhosis of liver with ascites: Secondary | ICD-10-CM

## 2017-08-14 DIAGNOSIS — R188 Other ascites: Secondary | ICD-10-CM | POA: Insufficient documentation

## 2017-08-14 HISTORY — PX: IR PARACENTESIS: IMG2679

## 2017-08-14 MED ORDER — LIDOCAINE HCL (PF) 2 % IJ SOLN
INTRAMUSCULAR | Status: AC
  Filled 2017-08-14: qty 20

## 2017-08-14 MED ORDER — ALBUMIN HUMAN 25 % IV SOLN
50.0000 g | Freq: Once | INTRAVENOUS | Status: AC
  Administered 2017-08-14: 50 g via INTRAVENOUS
  Filled 2017-08-14: qty 200

## 2017-08-14 NOTE — Procedures (Signed)
   RLQ paracentesis  5.5 L yellow fluid  Albumin 50 gr IV post procedure per ordering MD  Tolerated well

## 2017-08-28 ENCOUNTER — Encounter (HOSPITAL_COMMUNITY): Payer: Self-pay | Admitting: Physician Assistant

## 2017-08-28 ENCOUNTER — Ambulatory Visit (HOSPITAL_COMMUNITY)
Admission: RE | Admit: 2017-08-28 | Discharge: 2017-08-28 | Disposition: A | Discharge status: Home / Self Care | Payer: Self-pay | Source: Ambulatory Visit | Attending: Gastroenterology | Admitting: Gastroenterology

## 2017-08-28 DIAGNOSIS — K7031 Alcoholic cirrhosis of liver with ascites: Secondary | ICD-10-CM

## 2017-08-28 DIAGNOSIS — R188 Other ascites: Secondary | ICD-10-CM | POA: Insufficient documentation

## 2017-08-28 DIAGNOSIS — Z853 Personal history of malignant neoplasm of breast: Secondary | ICD-10-CM | POA: Insufficient documentation

## 2017-08-28 DIAGNOSIS — K7460 Unspecified cirrhosis of liver: Secondary | ICD-10-CM | POA: Insufficient documentation

## 2017-08-28 HISTORY — PX: IR PARACENTESIS: IMG2679

## 2017-08-28 MED ORDER — LIDOCAINE HCL (PF) 2 % IJ SOLN
INTRAMUSCULAR | Status: AC
  Filled 2017-08-28: qty 20

## 2017-08-28 MED ORDER — ALBUMIN HUMAN 25 % IV SOLN
50.0000 g | Freq: Once | INTRAVENOUS | Status: AC
  Administered 2017-08-28: 50 g via INTRAVENOUS
  Filled 2017-08-28: qty 200

## 2017-08-28 NOTE — Procedures (Signed)
PROCEDURE SUMMARY:  Successful US guided paracentesis from right lateral abdomen.  Yielded 5.6 liters of clear yellow fluid.  No immediate complications.  Patient tolerated well.   Gareth Eagle, PA-C 08/28/2017 2:35 PM

## 2017-09-11 ENCOUNTER — Ambulatory Visit (HOSPITAL_COMMUNITY)
Admission: RE | Admit: 2017-09-11 | Discharge: 2017-09-11 | Disposition: A | Discharge status: Home / Self Care | Payer: Medicaid Other | Source: Ambulatory Visit | Attending: Gastroenterology | Admitting: Gastroenterology

## 2017-09-11 ENCOUNTER — Encounter (HOSPITAL_COMMUNITY): Payer: Self-pay | Admitting: Diagnostic Radiology

## 2017-09-11 DIAGNOSIS — K7031 Alcoholic cirrhosis of liver with ascites: Secondary | ICD-10-CM

## 2017-09-11 DIAGNOSIS — R188 Other ascites: Secondary | ICD-10-CM | POA: Insufficient documentation

## 2017-09-11 HISTORY — PX: IR PARACENTESIS: IMG2679

## 2017-09-11 MED ORDER — LIDOCAINE HCL (PF) 1 % IJ SOLN
INTRAMUSCULAR | Status: AC
  Filled 2017-09-11: qty 30

## 2017-09-11 MED ORDER — LIDOCAINE HCL (PF) 1 % IJ SOLN
INTRAMUSCULAR | Status: DC | PRN
  Administered 2017-09-11: 10 mL

## 2017-09-11 NOTE — Procedures (Signed)
Prior to procedure, patient's blood pressure was elevated in the 180s/100s. Patient states that her blood pressure runs high, but not this high. Spoke with Dr. Ardis Hughs regarding patient's blood pressure. Per Dr. Ardis Hughs, ok to proceed with paracentesis, but no Albumin to be given post-procedure due to elevated blood pressure.  PROCEDURE SUMMARY:  Successful image-guided paracentesis from the right abdomen.  Yielded 5.7 liters of clear yellow fluid.  No immediate complications.  Patient tolerated well.   Specimen was not sent for labs.  Alicia Pong Irais Mottram PA-C 09/11/2017 1:46 PM

## 2017-09-23 ENCOUNTER — Other Ambulatory Visit: Payer: Self-pay | Admitting: Gastroenterology

## 2017-09-23 DIAGNOSIS — K7031 Alcoholic cirrhosis of liver with ascites: Secondary | ICD-10-CM

## 2017-09-25 ENCOUNTER — Encounter (HOSPITAL_COMMUNITY): Payer: Self-pay | Admitting: Radiology

## 2017-09-25 ENCOUNTER — Ambulatory Visit (HOSPITAL_COMMUNITY)
Admission: RE | Admit: 2017-09-25 | Discharge: 2017-09-25 | Disposition: A | Discharge status: Home / Self Care | Payer: Medicaid Other | Source: Ambulatory Visit | Attending: Gastroenterology | Admitting: Gastroenterology

## 2017-09-25 DIAGNOSIS — K7460 Unspecified cirrhosis of liver: Secondary | ICD-10-CM | POA: Insufficient documentation

## 2017-09-25 DIAGNOSIS — R188 Other ascites: Secondary | ICD-10-CM | POA: Diagnosis present

## 2017-09-25 DIAGNOSIS — K7031 Alcoholic cirrhosis of liver with ascites: Secondary | ICD-10-CM

## 2017-09-25 HISTORY — PX: IR PARACENTESIS: IMG2679

## 2017-09-25 MED ORDER — LIDOCAINE HCL 2 % IJ SOLN
INTRAMUSCULAR | Status: AC | PRN
  Administered 2017-09-25: 10 mL

## 2017-09-25 MED ORDER — LIDOCAINE HCL (PF) 2 % IJ SOLN
INTRAMUSCULAR | Status: AC
  Filled 2017-09-25: qty 20

## 2017-09-25 MED ORDER — ALBUMIN HUMAN 25 % IV SOLN
INTRAVENOUS | Status: AC
  Filled 2017-09-25: qty 200

## 2017-09-25 MED ORDER — ALBUMIN HUMAN 25 % IV SOLN
50.0000 g | INTRAVENOUS | Status: DC
  Administered 2017-09-25: 50 g via INTRAVENOUS
  Filled 2017-09-25: qty 200

## 2017-10-08 ENCOUNTER — Ambulatory Visit (HOSPITAL_COMMUNITY)
Admission: RE | Admit: 2017-10-08 | Discharge: 2017-10-08 | Disposition: A | Discharge status: Home / Self Care | Payer: Medicaid Other | Source: Ambulatory Visit | Attending: Gastroenterology | Admitting: Gastroenterology

## 2017-10-08 ENCOUNTER — Encounter (HOSPITAL_COMMUNITY): Payer: Self-pay | Admitting: Student

## 2017-10-08 DIAGNOSIS — K7031 Alcoholic cirrhosis of liver with ascites: Secondary | ICD-10-CM

## 2017-10-08 DIAGNOSIS — R188 Other ascites: Secondary | ICD-10-CM | POA: Diagnosis present

## 2017-10-08 HISTORY — PX: IR PARACENTESIS: IMG2679

## 2017-10-08 MED ORDER — ALBUMIN HUMAN 25 % IV SOLN
INTRAVENOUS | Status: AC
  Administered 2017-10-08: 50 g via INTRAVENOUS
  Filled 2017-10-08: qty 50

## 2017-10-08 MED ORDER — LIDOCAINE HCL (PF) 2 % IJ SOLN
INTRAMUSCULAR | Status: AC
  Filled 2017-10-08: qty 20

## 2017-10-08 MED ORDER — LIDOCAINE HCL 2 % IJ SOLN
INTRAMUSCULAR | Status: DC | PRN
  Administered 2017-10-08: 10 mL

## 2017-10-08 MED ORDER — ALBUMIN HUMAN 25 % IV SOLN
50.0000 g | Freq: Once | INTRAVENOUS | Status: AC
  Administered 2017-10-08: 50 g via INTRAVENOUS
  Filled 2017-10-08: qty 200

## 2017-10-08 MED ORDER — ALBUMIN HUMAN 25 % IV SOLN
INTRAVENOUS | Status: AC
  Filled 2017-10-08: qty 50

## 2017-10-08 NOTE — Procedures (Signed)
PROCEDURE SUMMARY:  Successful US guided paracentesis from right lateral abdomen.  Yielded 6.0 liters of yellow fluid.  No immediate complications.  Pt tolerated well.   Specimen was not sent for labs. To receive albumin today.   Docia Barrier PA-C 10/08/2017 3:53 PM

## 2017-10-14 ENCOUNTER — Telehealth: Payer: Self-pay | Admitting: Internal Medicine

## 2017-10-14 NOTE — Telephone Encounter (Signed)
The pt was advised to call [facility] IR at [phone] to reschedule her paracentesis.

## 2017-10-23 ENCOUNTER — Ambulatory Visit (HOSPITAL_COMMUNITY)
Admission: RE | Admit: 2017-10-23 | Discharge: 2017-10-23 | Disposition: A | Discharge status: Home / Self Care | Payer: Medicaid Other | Source: Ambulatory Visit | Attending: Gastroenterology | Admitting: Gastroenterology

## 2017-10-23 ENCOUNTER — Encounter (HOSPITAL_COMMUNITY): Payer: Self-pay

## 2017-10-23 ENCOUNTER — Ambulatory Visit (HOSPITAL_COMMUNITY): Payer: Medicaid Other

## 2017-10-23 ENCOUNTER — Encounter (HOSPITAL_COMMUNITY): Payer: Self-pay | Admitting: Physician Assistant

## 2017-10-23 DIAGNOSIS — R188 Other ascites: Secondary | ICD-10-CM | POA: Diagnosis not present

## 2017-10-23 DIAGNOSIS — K7031 Alcoholic cirrhosis of liver with ascites: Secondary | ICD-10-CM | POA: Diagnosis present

## 2017-10-23 HISTORY — PX: IR PARACENTESIS: IMG2679

## 2017-10-23 MED ORDER — LIDOCAINE HCL (PF) 2 % IJ SOLN
INTRAMUSCULAR | Status: AC
  Filled 2017-10-23: qty 20

## 2017-10-23 MED ORDER — ALBUMIN HUMAN 25 % IV SOLN
50.0000 g | Freq: Once | INTRAVENOUS | Status: DC
  Filled 2017-10-23: qty 200

## 2017-10-23 NOTE — Procedures (Signed)
PROCEDURE SUMMARY:  Successful image-guided paracentesis from the right abdomen.  Yielded 5.4 liters of clear yellow fluid.  No immediate complications.  Patient tolerated well.   Specimen was not sent for labs.  Joaquim Nam PA-C 10/23/2017 1:50 PM

## 2017-11-06 ENCOUNTER — Ambulatory Visit (HOSPITAL_COMMUNITY)
Admission: RE | Admit: 2017-11-06 | Discharge: 2017-11-06 | Disposition: A | Discharge status: Home / Self Care | Payer: Medicaid Other | Source: Ambulatory Visit | Attending: Gastroenterology | Admitting: Gastroenterology

## 2017-11-06 ENCOUNTER — Emergency Department (HOSPITAL_COMMUNITY): Payer: Medicaid Other

## 2017-11-06 ENCOUNTER — Inpatient Hospital Stay (HOSPITAL_COMMUNITY)
Admission: EM | Admit: 2017-11-06 | Discharge: 2017-11-08 | DRG: 920 | Disposition: A | Discharge status: Home / Self Care | Payer: Medicaid Other | Attending: Family Medicine | Admitting: Family Medicine

## 2017-11-06 ENCOUNTER — Other Ambulatory Visit: Payer: Self-pay

## 2017-11-06 ENCOUNTER — Encounter (HOSPITAL_COMMUNITY): Payer: Self-pay | Admitting: Physician Assistant

## 2017-11-06 DIAGNOSIS — K429 Umbilical hernia without obstruction or gangrene: Secondary | ICD-10-CM | POA: Diagnosis present

## 2017-11-06 DIAGNOSIS — R001 Bradycardia, unspecified: Secondary | ICD-10-CM | POA: Diagnosis present

## 2017-11-06 DIAGNOSIS — S301XXA Contusion of abdominal wall, initial encounter: Secondary | ICD-10-CM

## 2017-11-06 DIAGNOSIS — K8020 Calculus of gallbladder without cholecystitis without obstruction: Secondary | ICD-10-CM | POA: Diagnosis present

## 2017-11-06 DIAGNOSIS — K219 Gastro-esophageal reflux disease without esophagitis: Secondary | ICD-10-CM | POA: Diagnosis present

## 2017-11-06 DIAGNOSIS — I8510 Secondary esophageal varices without bleeding: Secondary | ICD-10-CM | POA: Diagnosis present

## 2017-11-06 DIAGNOSIS — N183 Chronic kidney disease, stage 3 unspecified: Secondary | ICD-10-CM

## 2017-11-06 DIAGNOSIS — L7632 Postprocedural hematoma of skin and subcutaneous tissue following other procedure: Principal | ICD-10-CM | POA: Diagnosis present

## 2017-11-06 DIAGNOSIS — K7031 Alcoholic cirrhosis of liver with ascites: Secondary | ICD-10-CM | POA: Diagnosis present

## 2017-11-06 DIAGNOSIS — I10 Essential (primary) hypertension: Secondary | ICD-10-CM

## 2017-11-06 DIAGNOSIS — Z888 Allergy status to other drugs, medicaments and biological substances status: Secondary | ICD-10-CM

## 2017-11-06 DIAGNOSIS — K7460 Unspecified cirrhosis of liver: Secondary | ICD-10-CM

## 2017-11-06 DIAGNOSIS — K7469 Other cirrhosis of liver: Secondary | ICD-10-CM | POA: Diagnosis not present

## 2017-11-06 DIAGNOSIS — R188 Other ascites: Secondary | ICD-10-CM

## 2017-11-06 DIAGNOSIS — D638 Anemia in other chronic diseases classified elsewhere: Secondary | ICD-10-CM | POA: Diagnosis present

## 2017-11-06 DIAGNOSIS — Z9889 Other specified postprocedural states: Secondary | ICD-10-CM

## 2017-11-06 DIAGNOSIS — Z833 Family history of diabetes mellitus: Secondary | ICD-10-CM

## 2017-11-06 DIAGNOSIS — Z7982 Long term (current) use of aspirin: Secondary | ICD-10-CM

## 2017-11-06 DIAGNOSIS — Y838 Other surgical procedures as the cause of abnormal reaction of the patient, or of later complication, without mention of misadventure at the time of the procedure: Secondary | ICD-10-CM | POA: Diagnosis present

## 2017-11-06 DIAGNOSIS — K766 Portal hypertension: Secondary | ICD-10-CM | POA: Diagnosis present

## 2017-11-06 DIAGNOSIS — I160 Hypertensive urgency: Secondary | ICD-10-CM | POA: Diagnosis present

## 2017-11-06 DIAGNOSIS — Z79899 Other long term (current) drug therapy: Secondary | ICD-10-CM

## 2017-11-06 DIAGNOSIS — I129 Hypertensive chronic kidney disease with stage 1 through stage 4 chronic kidney disease, or unspecified chronic kidney disease: Secondary | ICD-10-CM | POA: Diagnosis present

## 2017-11-06 DIAGNOSIS — K3189 Other diseases of stomach and duodenum: Secondary | ICD-10-CM | POA: Diagnosis present

## 2017-11-06 DIAGNOSIS — K5900 Constipation, unspecified: Secondary | ICD-10-CM | POA: Diagnosis present

## 2017-11-06 DIAGNOSIS — Z853 Personal history of malignant neoplasm of breast: Secondary | ICD-10-CM

## 2017-11-06 HISTORY — PX: IR PARACENTESIS: IMG2679

## 2017-11-06 LAB — PROTIME-INR
INR: 1.14
PROTHROMBIN TIME: 14.5 s (ref 11.4–15.2)

## 2017-11-06 LAB — ABO/RH: ABO/RH(D): O POS

## 2017-11-06 LAB — CBC WITH DIFFERENTIAL/PLATELET
Abs Immature Granulocytes: 0 10*3/uL (ref 0.0–0.1)
Basophils Absolute: 0 10*3/uL (ref 0.0–0.1)
Basophils Relative: 1 %
Eosinophils Absolute: 0.1 10*3/uL (ref 0.0–0.7)
Eosinophils Relative: 2 %
HCT: 31.9 % — ABNORMAL LOW (ref 36.0–46.0)
Hemoglobin: 9.4 g/dL — ABNORMAL LOW (ref 12.0–15.0)
Immature Granulocytes: 1 %
Lymphocytes Relative: 28 %
Lymphs Abs: 1.6 10*3/uL (ref 0.7–4.0)
MCH: 25.3 pg — ABNORMAL LOW (ref 26.0–34.0)
MCHC: 29.5 g/dL — ABNORMAL LOW (ref 30.0–36.0)
MCV: 85.8 fL (ref 78.0–100.0)
Monocytes Absolute: 0.5 10*3/uL (ref 0.1–1.0)
Monocytes Relative: 9 %
Neutro Abs: 3.4 10*3/uL (ref 1.7–7.7)
Neutrophils Relative %: 59 %
Platelets: 111 10*3/uL — ABNORMAL LOW (ref 150–400)
RBC: 3.72 MIL/uL — ABNORMAL LOW (ref 3.87–5.11)
RDW: 15.2 % (ref 11.5–15.5)
WBC: 5.7 10*3/uL (ref 4.0–10.5)

## 2017-11-06 LAB — COMPREHENSIVE METABOLIC PANEL
ALT: 8 U/L (ref 0–44)
AST: 16 U/L (ref 15–41)
Albumin: 3.6 g/dL (ref 3.5–5.0)
Alkaline Phosphatase: 107 U/L (ref 38–126)
Anion gap: 10 (ref 5–15)
BUN: 24 mg/dL — ABNORMAL HIGH (ref 6–20)
CO2: 19 mmol/L — ABNORMAL LOW (ref 22–32)
Calcium: 7.9 mg/dL — ABNORMAL LOW (ref 8.9–10.3)
Chloride: 112 mmol/L — ABNORMAL HIGH (ref 98–111)
Creatinine, Ser: 1.65 mg/dL — ABNORMAL HIGH (ref 0.44–1.00)
GFR calc Af Amer: 38 mL/min — ABNORMAL LOW (ref >60)
GFR calc non Af Amer: 33 mL/min — ABNORMAL LOW (ref >60)
Glucose, Bld: 124 mg/dL — ABNORMAL HIGH (ref 70–99)
Potassium: 3.6 mmol/L (ref 3.5–5.1)
Sodium: 141 mmol/L (ref 135–145)
Total Bilirubin: 0.7 mg/dL (ref 0.3–1.2)
Total Protein: 6.6 g/dL (ref 6.5–8.1)

## 2017-11-06 LAB — I-STAT CG4 LACTIC ACID, ED: Lactic Acid, Venous: 1.01 mmol/L (ref 0.5–1.9)

## 2017-11-06 LAB — TYPE AND SCREEN
ABO/RH(D): O POS
ANTIBODY SCREEN: NEGATIVE

## 2017-11-06 LAB — HEMOGLOBIN AND HEMATOCRIT, BLOOD
HCT: 28.5 % — ABNORMAL LOW (ref 36.0–46.0)
Hemoglobin: 8.7 g/dL — ABNORMAL LOW (ref 12.0–15.0)

## 2017-11-06 LAB — LIPASE, BLOOD: Lipase: 37 U/L (ref 11–51)

## 2017-11-06 MED ORDER — LABETALOL HCL 5 MG/ML IV SOLN
20.0000 mg | Freq: Once | INTRAVENOUS | Status: AC
  Administered 2017-11-07: 20 mg via INTRAVENOUS
  Filled 2017-11-06: qty 4

## 2017-11-06 MED ORDER — LIDOCAINE HCL (PF) 2 % IJ SOLN
INTRAMUSCULAR | Status: AC | PRN
  Administered 2017-11-06: 10 mL

## 2017-11-06 MED ORDER — LABETALOL HCL 5 MG/ML IV SOLN
20.0000 mg | Freq: Once | INTRAVENOUS | Status: AC
  Administered 2017-11-06: 20 mg via INTRAVENOUS
  Filled 2017-11-06: qty 4

## 2017-11-06 MED ORDER — LIDOCAINE HCL (PF) 2 % IJ SOLN
INTRAMUSCULAR | Status: AC
  Filled 2017-11-06: qty 20

## 2017-11-06 MED ORDER — ALBUMIN HUMAN 25 % IV SOLN
50.0000 g | Freq: Once | INTRAVENOUS | Status: AC
  Administered 2017-11-06: 50 g via INTRAVENOUS
  Filled 2017-11-06: qty 200

## 2017-11-06 MED ORDER — PANTOPRAZOLE SODIUM 40 MG PO TBEC
40.0000 mg | DELAYED_RELEASE_TABLET | Freq: Every day | ORAL | Status: DC
  Administered 2017-11-07 – 2017-11-08 (×2): 40 mg via ORAL
  Filled 2017-11-06 (×2): qty 1

## 2017-11-06 MED ORDER — MORPHINE SULFATE (PF) 4 MG/ML IV SOLN
4.0000 mg | Freq: Once | INTRAVENOUS | Status: AC
  Administered 2017-11-06: 4 mg via INTRAVENOUS
  Filled 2017-11-06: qty 1

## 2017-11-06 MED ORDER — ALBUMIN HUMAN 25 % IV SOLN
INTRAVENOUS | Status: AC
  Filled 2017-11-06: qty 100

## 2017-11-06 MED ORDER — HYDRALAZINE HCL 25 MG PO TABS
25.0000 mg | ORAL_TABLET | Freq: Three times a day (TID) | ORAL | Status: DC
  Administered 2017-11-07 – 2017-11-08 (×5): 25 mg via ORAL
  Filled 2017-11-06 (×6): qty 1

## 2017-11-06 MED ORDER — HYDROXYZINE HCL 25 MG PO TABS: 25.0000 mg | ORAL_TABLET | Freq: Four times a day (QID) | ORAL | Status: DC | PRN

## 2017-11-06 MED ORDER — HYDROMORPHONE HCL 1 MG/ML IJ SOLN
1.0000 mg | INTRAMUSCULAR | Status: AC | PRN
Start: 2017-11-06 — End: 2017-11-07
  Administered 2017-11-07 (×2): 1 mg via INTRAVENOUS
  Filled 2017-11-06 (×2): qty 1

## 2017-11-06 MED ORDER — ALBUMIN HUMAN 25 % IV SOLN
INTRAVENOUS | Status: AC
  Administered 2017-11-06: 50 g via INTRAVENOUS
  Filled 2017-11-06: qty 100

## 2017-11-06 MED ORDER — AMLODIPINE BESYLATE 5 MG PO TABS
5.0000 mg | ORAL_TABLET | Freq: Every day | ORAL | Status: DC
  Administered 2017-11-07 – 2017-11-08 (×2): 5 mg via ORAL
  Filled 2017-11-06 (×2): qty 1

## 2017-11-06 MED ORDER — HYDROMORPHONE HCL 1 MG/ML IJ SOLN
1.0000 mg | Freq: Once | INTRAMUSCULAR | Status: AC
  Administered 2017-11-06: 1 mg via INTRAVENOUS
  Filled 2017-11-06: qty 1

## 2017-11-06 MED ORDER — IOHEXOL 300 MG/ML  SOLN
100.0000 mL | Freq: Once | INTRAMUSCULAR | Status: AC | PRN
  Administered 2017-11-06: 100 mL via INTRAVENOUS

## 2017-11-06 NOTE — ED Provider Notes (Addendum)
Patient placed in Quick Look pathway, seen and evaluated   Chief Complaint: Abdominal pain  HPI:   Patient presenting after paracentesis today with severe abdominal pain.  Patient has pain to her right sided abdomen above the area of paracentesis.  4.3 L were removed today.  She has had progressively worsening pain since onset.  She denies any nausea, vomiting, diarrhea, fever.  No medications taken prior to arrival.  ROS: + Abdominal pain  Physical Exam:   Gen: No distress  Neuro: Awake and Alert  Skin: Warm    Focused Exam: Patient with severe right-sided abdominal tenderness with rigidity and mass, heart normal rate and rhythm, lungs clear to auscultation   Initiation of care has begun. The patient has been counseled on the process, plan, and necessity for staying for the completion/evaluation, and the remainder of the medical screening examination     Frederica Kuster, PA-C 11/06/17 1756    Dorie Rank, MD 11/06/17 [phone]

## 2017-11-06 NOTE — Procedures (Signed)
PROCEDURE SUMMARY:  Successful image-guided paracentesis from the right lower abdomen.  Yielded 4.3 liters of yellow fluid.  No immediate complications - patient tolerated insertion of catheter, drainage of fluid and removal of catheter without incident, however upon standing after procedure she experienced sharp RUQ pain approximately 4 inches above catheter insertion site. No other complaints, VSS, no leakage of fluid from insertion site. Patient was observed for 30 minutes and pain dissipated. Patient d/ced to home with instructions to return to ED if pain worsens or other s/s develop.  Specimen was not sent for labs.  Joaquim Nam PA-C 11/06/2017 2:59 PM

## 2017-11-06 NOTE — ED Provider Notes (Signed)
[facility] EMERGENCY DEPARTMENT Provider Note   CSN: [id number] Arrival date & time: 11/06/17  1659     History   Chief Complaint Chief Complaint  Patient presents with  . Abdominal Pain    HPI Alicia [name] is a 62 y.o. female.  HPI Patient presents to the emergency room for evaluation of right-sided abdominal pain.  Patient has a history of cirrhosis.  She had a therapeutic paracentesis today performed as an outpatient.  Patient had 4.3 L of fluid removed.  Patient states shortly after the procedure while she was getting in the car she started to develop severe pain on the right side of her abdomen where she had the paracentesis performed.  Patient also noticed swelling right underneath the skin surface and experience significant tenderness right at that site.  Patient denies any trouble with any fevers.  No vomiting.  The pain has been increasing in severity.  He has never had this type of complication before associated with any of her prior paracentesis procedures. Past Medical History:  Diagnosis Date  . Ascites   . Breast cancer ([place])   . Cancer ([place])   . Chronic anticoagulation   . Cirrhosis ([place])   . Dyspnea    with fluid buildup   . GERD (gastroesophageal reflux disease)   . Hypertension   . Hypotension     Patient Active Problem List   Diagnosis Date Noted  . Cirrhosis of liver with ascites ([place])   . Secondary esophageal varices without bleeding ([place])   . 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 HTN (hypertension) 08/08/2016  . Anemia in other chronic diseases classified elsewhere 08/08/2016  . CKD (chronic kidney disease) stage 3, GFR 30-59 ml/min ([facility]) 08/08/2016  . Hypokalemia 08/08/2016  . Gastroesophageal reflux disease   . Portal hypertension ([place])   . Ascites 08/07/2016    Past Surgical History:  Procedure Laterality Date  . BREAST LUMPECTOMY    . BREAST SURGERY    . CESAREAN SECTION    .  ESOPHAGOGASTRODUODENOSCOPY (EGD) WITH PROPOFOL N/A 11/21/2016   Procedure: ESOPHAGOGASTRODUODENOSCOPY;  Surgeon: Milus Banister, MD;  Location: [facility] ENDOSCOPY;  Service: Endoscopy;  Laterality: N/A;  . IR PARACENTESIS  09/04/2016  . IR PARACENTESIS  09/20/2016  . IR PARACENTESIS  10/22/2016  . IR PARACENTESIS  01/27/2017  . IR PARACENTESIS  04/11/2017  . IR PARACENTESIS  06/05/2017  . IR PARACENTESIS  06/05/2017  . IR PARACENTESIS  06/19/2017  . IR PARACENTESIS  07/03/2017  . IR PARACENTESIS  07/17/2017  . IR PARACENTESIS  08/01/2017  . IR PARACENTESIS  08/14/2017  . IR PARACENTESIS  08/28/2017  . IR PARACENTESIS  09/11/2017  . IR PARACENTESIS  09/25/2017  . IR PARACENTESIS  10/08/2017  . IR PARACENTESIS  10/23/2017  . IR PARACENTESIS  11/06/2017  . LYMPH NODE DISSECTION       OB History   None      Home Medications    Prior to Admission medications   Medication Sig Start Date End Date Taking? Authorizing Provider  acetaminophen (TYLENOL) 500 MG tablet Take 1,000 mg by mouth every 8 (eight) hours as needed for mild pain.    [provider]  amLODipine (NORVASC) 5 MG tablet Take 1 tablet (5 mg total) by mouth daily. 08/09/16   Barton Dubois, MD  aspirin-acetaminophen-caffeine (EXCEDRIN MIGRAINE) [phone] MG tablet Take 2 tablets by mouth every 8 (eight) hours as needed for headache.     [provider]  diphenhydrAMINE (BENADRYL) 25 MG tablet Take 25 mg by mouth every 6 (six) hours as needed for itching.    [provider]  furosemide (LASIX) 40 MG tablet Take 1.5 tablets (60 mg total) by mouth daily. Take 1 1/2 tabs daily 06/06/17 07/06/17  Milus Banister, MD  hydrALAZINE (APRESOLINE) 25 MG tablet Take 1 tablet (25 mg total) by mouth 2 (two) times daily. Patient taking differently: Take 25 mg by mouth 3 (three) times daily.  08/08/16   Barton Dubois, MD  hydrOXYzine (ATARAX/VISTARIL) 25 MG tablet Take 1 tablet (25 mg total) by mouth every 6 (six) hours as needed for  itching. 06/06/17   Milus Banister, MD  magnesium oxide (MAG-OX) 400 MG tablet Take 400 mg by mouth daily.    [provider]  nadolol (CORGARD) 40 MG tablet Take 1 tablet (40 mg total) by mouth daily. Patient not taking: Reported on 06/04/2017 11/21/16   Milus Banister, MD  omeprazole (PRILOSEC) 40 MG capsule Take 1 capsule (40 mg total) by mouth daily. Patient taking differently: Take 40 mg by mouth daily as needed (Heart burn).  08/08/16   Barton Dubois, MD  spironolactone (ALDACTONE) 50 MG tablet Take 1.5 tablets (75 mg total) by mouth 2 (two) times daily. 12/11/16 06/04/17  Milus Banister, MD    Family History Family History  Problem Relation Age of Onset  . Diabetes Mother   . Diabetes Father   . Diabetes Brother   . Colon cancer Neg Hx   . Stomach cancer Neg Hx   . Esophageal cancer Neg Hx     Social History Social History   Tobacco Use  . Smoking status: Never Smoker  . Smokeless tobacco: Never Used  Substance Use Topics  . Alcohol use: No    Comment: occassional wine   . Drug use: No     Allergies   Compazine [prochlorperazine edisylate] and Meclizine   Review of Systems Review of Systems  All other systems reviewed and are negative.    Physical Exam Updated Vital Signs BP (!) 205/111   Pulse 92   Temp 98.7 F (37.1 C) (Oral)   Resp 17   Ht 1.524 m (5')   Wt 74.8 kg (165 lb)   SpO2 98%   BMI 32.22 kg/m   Physical Exam  Constitutional: She appears distressed.  Appears to be in pain  HENT:  Head: Normocephalic and atraumatic.  Right Ear: External ear normal.  Left Ear: External ear normal.  Eyes: Conjunctivae are normal. Right eye exhibits no discharge. Left eye exhibits no discharge. No scleral icterus.  Neck: Neck supple. No tracheal deviation present.  Cardiovascular: Normal rate, regular rhythm and intact distal pulses.  Pulmonary/Chest: Effort normal and breath sounds normal. No stridor. No respiratory distress. She has no wheezes.  She has no rales.  Abdominal: Soft. Bowel sounds are normal. There is tenderness. There is no rebound and no guarding.  Firm area of swelling and tenderness in the right abdominal wall  Musculoskeletal: She exhibits no edema or tenderness.  Neurological: She is alert. She has normal strength. No cranial nerve deficit (no facial droop, extraocular movements intact, no slurred speech) or sensory deficit. She exhibits normal muscle tone. She displays no seizure activity. Coordination normal.  Skin: Skin is warm and dry. No rash noted.  Psychiatric: She has a normal mood and affect.  Nursing note and vitals reviewed.    ED Treatments / Results  Labs (all labs ordered  are listed, but only abnormal results are displayed) Labs Reviewed  COMPREHENSIVE METABOLIC PANEL - Abnormal; Notable for the following components:      Result Value   Chloride 112 (*)    CO2 19 (*)    Glucose, Bld 124 (*)    BUN 24 (*)    Creatinine, Ser 1.65 (*)    Calcium 7.9 (*)    GFR calc non Af Amer 33 (*)    GFR calc Af Amer 38 (*)    All other components within normal limits  CBC WITH DIFFERENTIAL/PLATELET - Abnormal; Notable for the following components:   RBC 3.72 (*)    Hemoglobin 9.4 (*)    HCT 31.9 (*)    MCH 25.3 (*)    MCHC 29.5 (*)    Platelets 111 (*)    All other components within normal limits  LIPASE, BLOOD  PROTIME-INR  I-STAT CG4 LACTIC ACID, ED  TYPE AND SCREEN    EKG None  Radiology Ct Abdomen Pelvis W Contrast  Result Date: 11/06/2017 CLINICAL DATA:  Severe abdominal pain status post paracentesis EXAM: CT ABDOMEN AND PELVIS WITH CONTRAST TECHNIQUE: Multidetector CT imaging of the abdomen and pelvis was performed using the standard protocol following bolus administration of intravenous contrast. CONTRAST:  153mL OMNIPAQUE IOHEXOL 300 MG/ML  SOLN COMPARISON:  None. FINDINGS: Lower chest: Lung bases are clear. Hepatobiliary: Nodular hepatic contour, reflecting cirrhosis. No focal hepatic  lesion is seen. 3 mm layering gallstone (series 3/image 32), without associated inflammatory changes. No intrahepatic or extrahepatic ductal dilatation. Pancreas: Within normal limits. Spleen: Within normal limits. Adrenals/Urinary Tract: Adrenal glands are within normal limits. Kidneys are within normal limits.  No hydronephrosis. Bladder is within normal limits. Stomach/Bowel: Stomach is within normal limits. No evidence of bowel obstruction. Appendix is not discretely visualized. Mild rectal stool burden. Vascular/Lymphatic: No evidence of abdominal aortic aneurysm. Atherosclerotic calcifications of the abdominal aorta and branch vessels. No suspicious abdominopelvic lymphadenopathy. Reproductive: Uterus is within normal limits. Bilateral ovaries are unremarkable. Other: Small volume residual abdominopelvic ascites. Small fat/fluid containing periumbilical hernia. Additional moderate fat/fluid containing midline supraumbilical ventral hernia (series 3/image 50). Small fat/fluid containing left inguinal hernia and tiny fat containing right inguinal hernia. Musculoskeletal: Right rectus sheath hematoma measuring 5.4 x 8.6 x 8.4 cm (series 3/image 48). No evidence of active extravasation. No focal osseous lesions. IMPRESSION: Right rectus sheath hematoma measuring 5.4 x 8.6 x 8.4 cm. No evidence of active extravasation. Small volume residual abdominopelvic ascites. Additional ancillary findings as above. Electronically Signed   By: Julian Hy M.D.   On: 11/06/2017 19:36   Ir Paracentesis  Result Date: 11/06/2017 INDICATION: Recurrent ascites secondary to cirrhosis. Request for therapeutic paracentesis. EXAM: ULTRASOUND GUIDED THERAPEUTIC PARACENTESIS MEDICATIONS: 10 mL 2% lidocaine COMPLICATIONS: None immediate. PROCEDURE: Informed written consent was obtained from the patient after a discussion of the risks, benefits and alternatives to treatment. A timeout was performed prior to the initiation of the  procedure. Initial ultrasound scanning demonstrates a large amount of ascites within the right lower abdominal quadrant. The right lower abdomen was prepped and draped in the usual sterile fashion. 2% was used for local anesthesia. Following this, a 19 gauge, 7-cm, Yueh catheter was introduced. An ultrasound image was saved for documentation purposes. The paracentesis was performed. The catheter was removed and a dressing was applied. The patient tolerated the procedure well without immediate post procedural complication. FINDINGS: A total of approximately 4.3L of yellow fluid was removed. Samples were not sent to the  laboratory as requested by the clinical team. IMPRESSION: Successful ultrasound-guided paracentesis yielding 4.3 liters of peritoneal fluid. Read by Candiss Norse, PA-C Electronically Signed   By: Markus Daft M.D.   On: 11/06/2017 15:23    Procedures .Critical Care Performed by: Dorie Rank, MD Authorized by: Dorie Rank, MD   Critical care provider statement:    Critical care time (minutes):  30   Critical care was time spent personally by me on the following activities:  Discussions with consultants, evaluation of patient's response to treatment, examination of patient, ordering and performing treatments and interventions, ordering and review of laboratory studies, ordering and review of radiographic studies, pulse oximetry, re-evaluation of patient's condition, obtaining history from patient or surrogate and review of old charts   (including critical care time)  Medications Ordered in ED Medications  labetalol (NORMODYNE,TRANDATE) injection 20 mg (has no administration in time range)  morphine 4 MG/ML injection 4 mg (4 mg Intravenous Given 11/06/17 1754)  HYDROmorphone (DILAUDID) injection 1 mg (1 mg Intravenous Given 11/06/17 1903)  iohexol (OMNIPAQUE) 300 MG/ML solution 100 mL (100 mLs Intravenous Contrast Given 11/06/17 1915)     Initial Impression / Assessment and Plan / ED  Course  I have reviewed the triage vital signs and the nursing notes.  Pertinent labs & imaging results that were available during my care of the patient were reviewed by me and considered in my medical decision making (see chart for details).  Clinical Course as of Nov 06 2021  Thu Nov 06, 2017  1915 Hgb decreased from prior   [JK]  2022 CT scan shows a rectus sheath hematoma.  Patient remains hypertensive.  I will order antihypertensive medications   [JK]    Clinical Course User Index [JK] Dorie Rank, MD    Patient presented to the emergency room for evaluation of abdominal pain following a paracentesis procedure.  Patient's exam is consistent with an abdominal wall hematoma.  Patient's hemoglobin has dropped one-point.  She continues to have pain.  It does not appear that she is having any active extravasation but I think it is reasonable to bring her in for observation to make sure this hematoma does not continue to expand.  Patient is also significantly hypertensive.  Have ordered a dose of labetalol.  Consult with medical service for admission.  Final Clinical Impressions(s) / ED Diagnoses   Final diagnoses:  Abdominal wall hematoma, initial encounter  Hypertension, unspecified type       Dorie Rank, MD 11/06/17 2024

## 2017-11-06 NOTE — ED Triage Notes (Signed)
Pt states that she gets thoracentesis every 2 weeks. Pt states she had one today and started having pain after leaving today. Pt reports she had 5.4 L drained today.

## 2017-11-06 NOTE — ED Notes (Signed)
Admitting at bedside 

## 2017-11-06 NOTE — H&P (Addendum)
[facility] Admission History and Physical Service Pager: [phone]  Patient name: Alicia [name] Medical record number: [id number] Date of birth: 02-06-1956 Age: 62 y.o. Gender: female  Primary Care Provider: Milus Banister, MD Consultants: none Code Status: Full  Chief Complaint: stomach pain  Assessment and Plan: Alicia [name] is a 62 y.o. female presenting with abdominal pain following paracentesis. PMH is significant for alcoholic cirrhosis with weekly paracentesis, HTN, anemia.   Rectus sheath hematoma: acute Patient presented to the ED shortly after her large volume paracentesis which was followed by significant abdominal pain around her paracentesis site.  On presentation her vitals were remarkable for hypertension with systolics in the 762U and 190s.  Admission labs were remarkable for hemoglobin down to 9.4 from previous hemoglobin of 10.7, WBC: 5.7, lactic acid 1.01. abdominal CT showed a rectal sheath hematoma Measuring 5.4 x 8.6 x 8.4 cm.  CT was also remarkable for 3 mm layering gallstones without inflammatory changes and small fat-containing hernias in the para periumbilical ventral and left inguinal areas, no air or signs of viscus perforation.  Differential diagnosis includes: Rectal sheath hematoma, cholecystitis, abdominal/inguinal hernia.  Cholecystitis unlikely due to lack of classic findings including pain with eating, nausea, inflammatory changes of the gallbladder, LFT elevations.  Pain secondary to hernias is unlikely due to to lack of stranding around hernias found on CT and lower lactate.  Most likely diagnosis at this time is pain to to presumed new rectal sheath hematoma. -admit to med surg, Dr. Ardelia Mems attending -type and screen completed -cbc q6 -Transfuse for hemoglobin under 7 -Pain control, currently on Dilaudid 1 mg every 4 as needed -Monitor vitals -Up with assistance  Cirrhosis w/ ascites: presumed etiology is alcohol  use.  Per patient last drink was over 2 years ago.  Follows with Dr. Ardis Hughs.  Last appointment on 01/31/2017.  EGD on 11/2016 showed small varices, mild portal gastropathy.  Per note from Dr. Ardis Hughs she has shown repeatedly that she is unable to follow-up with basic medical recommendations and is a poor candidate for transplant, she may be a good candidate for TIPS procedure though she has declined the suggestion on previous occasions.  It is unclear what she has been taking at home she denies regular use of Lasix. -Do not administer furosemide or Spironolactone -consider calling GI to clarify med regimen  -Follow-up with outpatient GI -continue biweekly paracentesis  HTN Patient has a history of hypertension and her home medication includes amlodipine 5 mg, hydralazine 25 mg twice daily.  Blood pressures in the hospital have been elevated with systolics from 633-354.  On 8/1 patient did not take blood pressure medication in the morning and received 1 dose of labetalol 20 mg in the ED. -Hydralazine 25 mg 3 times daily -Amlodipine 5 mg daily -Monitor vitals  Anemia: Acute on chronic Patient appears to have a history of anemia likely related to chronic disease. Admission labs were remarkable for hemoglobin down to 9.4 from previous hemoglobin of 10.7 Anemia is acutely worsened in the setting of rectus sheath hematoma.  Patient reports being on vitamin D and iron in the past.  Does not currently take medication at home for anemia. -trend CBC as above -Iron panel pending -Consider iron supplementation  GERD Patient has a history of GERD and currently takes omeprazole 40 mg at home. No symptoms at this time. -Pantoprazole 40 mg p.o.  FEN/GI: General diet Prophylaxis: SCDs  Disposition: admitted to [place] for observation  History of Present Illness:  Alicia [name] is a 62 y.o. female presenting with sharp R sided abdominal pain. She has a PMH significant for alcoholic cirrhosis with weekly  paracentesis, GERD, anemia, HTN. She was at [facility] earlier in the day for her weekly paracentesis. When she got up from the bed after the paracentesis, she felt dizzy. She sat back down and then felt some R side pain. This was shortly after she got the albumin infusion. Has been getting paracenteses for 2 years, never had pain like this. She was monitored for a few min extra after paracentesis 2/2 dizziness. She later left the paracentesis clinic and was running errands but was not feeling well when she stopped at [employer]. She drove her back to the ED.  Getting out of the car at the ED she felt weak and short of breath.  She was unable to walk into the ED 2/2 pain. The pain is described as constant sharp pain that beginning in her RLQ (at paracentesis site) and radiating down to her pelvis. Denies fever, nausea, emesis, diarrhea.   Pt noted a history of constipation. Last BM yesterday was hard. Tried laxative OTC.   Review Of Systems: Per HPI with the following additions: denies fever, endorses lightheadedness, endorses SOB with trying to walk in, denies CP, denies nausea or emesis. Endorses constipation. Denies dysuria.   Review of Systems  Constitutional: Negative for chills and fever.  HENT: Negative for sore throat.   Respiratory: Positive for shortness of breath.   Cardiovascular: Negative for chest pain and palpitations.  Gastrointestinal: Positive for constipation. Negative for abdominal pain, diarrhea, nausea and vomiting.  Genitourinary: Negative for dysuria.  Musculoskeletal: Positive for myalgias. Negative for joint pain.  Neurological: Positive for weakness.    Patient Active Problem List   Diagnosis Date Noted  . Rectus sheath hematoma 11/06/2017  . Other cirrhosis of liver ([place])   . Secondary esophageal varices without bleeding ([place])   . Alcoholic cirrhosis of liver with ascites ([place]) 08/08/2016  . HTN (hypertension) 08/08/2016  . Anemia in other chronic diseases classified  elsewhere 08/08/2016  . Stage 3 chronic kidney disease ([place]) 08/08/2016  . Hypokalemia 08/08/2016  . Gastroesophageal reflux disease   . Portal hypertension ([place])   . Ascites 08/07/2016    Past Medical History: Past Medical History:  Diagnosis Date  . Ascites   . Breast cancer ([place])   . Cancer ([place])   . Chronic anticoagulation   . Cirrhosis ([place])   . Dyspnea    with fluid buildup   . GERD (gastroesophageal reflux disease)   . Hypertension   . Hypotension     Past Surgical History: Past Surgical History:  Procedure Laterality Date  . BREAST LUMPECTOMY    . BREAST SURGERY    . CESAREAN SECTION    . ESOPHAGOGASTRODUODENOSCOPY (EGD) WITH PROPOFOL N/A 11/21/2016   Procedure: ESOPHAGOGASTRODUODENOSCOPY;  Surgeon: Milus Banister, MD;  Location: [facility] ENDOSCOPY;  Service: Endoscopy;  Laterality: N/A;  . IR PARACENTESIS  09/04/2016  . IR PARACENTESIS  09/20/2016  . IR PARACENTESIS  10/22/2016  . IR PARACENTESIS  01/27/2017  . IR PARACENTESIS  04/11/2017  . IR PARACENTESIS  06/05/2017  . IR PARACENTESIS  06/05/2017  . IR PARACENTESIS  06/19/2017  . IR PARACENTESIS  07/03/2017  . IR PARACENTESIS  07/17/2017  . IR PARACENTESIS  08/01/2017  . IR PARACENTESIS  08/14/2017  . IR PARACENTESIS  08/28/2017  . IR PARACENTESIS  09/11/2017  . IR PARACENTESIS  09/25/2017  . IR PARACENTESIS  10/08/2017  . IR PARACENTESIS  10/23/2017  . IR PARACENTESIS  11/06/2017  . LYMPH NODE DISSECTION      Social History: Social History   Tobacco Use  . Smoking status: Never Smoker  . Smokeless tobacco: Never Used  Substance Use Topics  . Alcohol use: No    Comment: occassional wine   . Drug use: No   Additional social history: lives at home with sone (64 y/o) and nephew (72 y/o)  Please also refer to relevant sections of EMR.  Family History: Family History  Problem Relation Age of Onset  . Diabetes Mother   . Diabetes Father   . Diabetes Brother   . Colon cancer Neg Hx   . Stomach cancer Neg Hx   .  Esophageal cancer Neg Hx      Allergies and Medications: Allergies  Allergen Reactions  . Compazine [Prochlorperazine Edisylate] Anxiety  . Meclizine Anxiety   Current Facility-Administered Medications on File Prior to Encounter  Medication Dose Route Frequency Provider Last Rate Last Dose  . albumin human 25 % solution           . lidocaine (XYLOCAINE) 2 % injection            Current Outpatient Medications on File Prior to Encounter  Medication Sig Dispense Refill  . acetaminophen (TYLENOL) 500 MG tablet Take 1,000 mg by mouth every 8 (eight) hours as needed for mild pain.    Marland Kitchen amLODipine (NORVASC) 5 MG tablet Take 1 tablet (5 mg total) by mouth daily. 30 tablet 1  . aspirin-acetaminophen-caffeine (EXCEDRIN MIGRAINE) [phone] MG tablet Take 2 tablets by mouth every 8 (eight) hours as needed for headache.     . diphenhydrAMINE (BENADRYL) 25 MG tablet Take 25 mg by mouth every 6 (six) hours as needed for itching.    . furosemide (LASIX) 40 MG tablet Take 1.5 tablets (60 mg total) by mouth daily. Take 1 1/2 tabs daily 45 tablet 0  . hydrALAZINE (APRESOLINE) 25 MG tablet Take 1 tablet (25 mg total) by mouth 2 (two) times daily. (Patient taking differently: Take 25 mg by mouth 3 (three) times daily. ) 60 tablet 1  . hydrOXYzine (ATARAX/VISTARIL) 25 MG tablet Take 1 tablet (25 mg total) by mouth every 6 (six) hours as needed for itching. 120 tablet 0  . magnesium oxide (MAG-OX) 400 MG tablet Take 400 mg by mouth daily.    . nadolol (CORGARD) 40 MG tablet Take 1 tablet (40 mg total) by mouth daily. (Patient not taking: Reported on 06/04/2017) 30 tablet 11  . omeprazole (PRILOSEC) 40 MG capsule Take 1 capsule (40 mg total) by mouth daily. (Patient taking differently: Take 40 mg by mouth daily as needed (Heart burn). ) 30 capsule 1  . spironolactone (ALDACTONE) 50 MG tablet Take 1.5 tablets (75 mg total) by mouth 2 (two) times daily. 90 tablet 6    Objective: BP (!) 214/114 (BP Location:  Right Arm)   Pulse 71   Temp 98.9 F (37.2 C) (Oral)   Resp 13   Ht 5' (1.524 m)   Wt 173 lb 8 oz (78.7 kg)   SpO2 99%   BMI 33.88 kg/m  Exam: Physical Exam  Constitutional: She is oriented to person, place, and time. She appears well-developed and well-nourished.  Non-toxic appearance. She does not appear ill. No distress.  Eyes: Pupils are equal, round, and reactive to light. EOM are normal. No scleral icterus.  Cardiovascular: Normal rate, regular rhythm and intact distal  pulses. Exam reveals no gallop and no friction rub.  Murmur (2/6 systolic) heard. Pulmonary/Chest: Effort normal and breath sounds normal. No stridor. No respiratory distress. She has no wheezes. She has no rhonchi. She has no rales. She exhibits no tenderness.  Abdominal: Bowel sounds are normal. She exhibits distension. There is tenderness (tender to palpation in RLQ with small area of induration (1cmx1cm) superior to parcentesis site.).  Genitourinary: Uterus is not enlarged.  Neurological: She is alert and oriented to person, place, and time.  Psychiatric: She has a normal mood and affect. Her behavior is normal.     Labs and Imaging: CBC BMET  Recent Labs  Lab 11/06/17 1731 11/06/17 2327  WBC 5.7  --   HGB 9.4* 8.7*  HCT 31.9* 28.5*  PLT 111*  --    Recent Labs  Lab 11/06/17 1731  NA 141  K 3.6  CL 112*  CO2 19*  BUN 24*  CREATININE 1.65*  GLUCOSE 124*  CALCIUM 7.9*     Ct Abdomen Pelvis W Contrast  Result Date: 11/06/2017 CLINICAL DATA:  Severe abdominal pain status post paracentesis EXAM: CT ABDOMEN AND PELVIS WITH CONTRAST TECHNIQUE: Multidetector CT imaging of the abdomen and pelvis was performed using the standard protocol following bolus administration of intravenous contrast. CONTRAST:  153mL OMNIPAQUE IOHEXOL 300 MG/ML  SOLN COMPARISON:  None. FINDINGS: Lower chest: Lung bases are clear. Hepatobiliary: Nodular hepatic contour, reflecting cirrhosis. No focal hepatic lesion is seen. 3 mm  layering gallstone (series 3/image 32), without associated inflammatory changes. No intrahepatic or extrahepatic ductal dilatation. Pancreas: Within normal limits. Spleen: Within normal limits. Adrenals/Urinary Tract: Adrenal glands are within normal limits. Kidneys are within normal limits.  No hydronephrosis. Bladder is within normal limits. Stomach/Bowel: Stomach is within normal limits. No evidence of bowel obstruction. Appendix is not discretely visualized. Mild rectal stool burden. Vascular/Lymphatic: No evidence of abdominal aortic aneurysm. Atherosclerotic calcifications of the abdominal aorta and branch vessels. No suspicious abdominopelvic lymphadenopathy. Reproductive: Uterus is within normal limits. Bilateral ovaries are unremarkable. Other: Small volume residual abdominopelvic ascites. Small fat/fluid containing periumbilical hernia. Additional moderate fat/fluid containing midline supraumbilical ventral hernia (series 3/image 50). Small fat/fluid containing left inguinal hernia and tiny fat containing right inguinal hernia. Musculoskeletal: Right rectus sheath hematoma measuring 5.4 x 8.6 x 8.4 cm (series 3/image 48). No evidence of active extravasation. No focal osseous lesions. IMPRESSION: Right rectus sheath hematoma measuring 5.4 x 8.6 x 8.4 cm. No evidence of active extravasation. Small volume residual abdominopelvic ascites. Additional ancillary findings as above. Electronically Signed   By: Julian Hy M.D.   On: 11/06/2017 19:36   Ir Paracentesis  Result Date: 11/06/2017 INDICATION: Recurrent ascites secondary to cirrhosis. Request for therapeutic paracentesis. EXAM: ULTRASOUND GUIDED THERAPEUTIC PARACENTESIS MEDICATIONS: 10 mL 2% lidocaine COMPLICATIONS: None immediate. PROCEDURE: Informed written consent was obtained from the patient after a discussion of the risks, benefits and alternatives to treatment. A timeout was performed prior to the initiation of the procedure. Initial  ultrasound scanning demonstrates a large amount of ascites within the right lower abdominal quadrant. The right lower abdomen was prepped and draped in the usual sterile fashion. 2% was used for local anesthesia. Following this, a 19 gauge, 7-cm, Yueh catheter was introduced. An ultrasound image was saved for documentation purposes. The paracentesis was performed. The catheter was removed and a dressing was applied. The patient tolerated the procedure well without immediate post procedural complication. FINDINGS: A total of approximately 4.3L of yellow fluid was removed. Samples were  not sent to the laboratory as requested by the clinical team. IMPRESSION: Successful ultrasound-guided paracentesis yielding 4.3 liters of peritoneal fluid. Read by Candiss Norse, PA-C Electronically Signed   By: Markus Daft M.D.   On: 11/06/2017 15:23     Matilde Haymaker, MD 11/07/2017, 12:06 AM PGY-1, [place] Intern pager: [phone], text pages welcome  FPTS Upper-Level Resident Addendum  I have independently interviewed and examined the patient. I have discussed the above with the original author and agree with their documentation. My edits for correction/addition/clarification are in blue. Please see also any attending notes.   Ralene Ok, MD PGY-2, [place] Service pager: [phone] (text pages welcome through [facility])

## 2017-11-07 ENCOUNTER — Encounter (HOSPITAL_COMMUNITY): Payer: Self-pay

## 2017-11-07 DIAGNOSIS — Z888 Allergy status to other drugs, medicaments and biological substances status: Secondary | ICD-10-CM | POA: Diagnosis not present

## 2017-11-07 DIAGNOSIS — Z7982 Long term (current) use of aspirin: Secondary | ICD-10-CM | POA: Diagnosis not present

## 2017-11-07 DIAGNOSIS — D638 Anemia in other chronic diseases classified elsewhere: Secondary | ICD-10-CM | POA: Diagnosis present

## 2017-11-07 DIAGNOSIS — Z9889 Other specified postprocedural states: Secondary | ICD-10-CM | POA: Diagnosis not present

## 2017-11-07 DIAGNOSIS — K8020 Calculus of gallbladder without cholecystitis without obstruction: Secondary | ICD-10-CM | POA: Diagnosis present

## 2017-11-07 DIAGNOSIS — K219 Gastro-esophageal reflux disease without esophagitis: Secondary | ICD-10-CM | POA: Diagnosis present

## 2017-11-07 DIAGNOSIS — R001 Bradycardia, unspecified: Secondary | ICD-10-CM | POA: Diagnosis present

## 2017-11-07 DIAGNOSIS — K7469 Other cirrhosis of liver: Secondary | ICD-10-CM | POA: Diagnosis not present

## 2017-11-07 DIAGNOSIS — K5900 Constipation, unspecified: Secondary | ICD-10-CM | POA: Diagnosis present

## 2017-11-07 DIAGNOSIS — S301XXD Contusion of abdominal wall, subsequent encounter: Secondary | ICD-10-CM | POA: Diagnosis not present

## 2017-11-07 DIAGNOSIS — I10 Essential (primary) hypertension: Secondary | ICD-10-CM | POA: Diagnosis not present

## 2017-11-07 DIAGNOSIS — L7632 Postprocedural hematoma of skin and subcutaneous tissue following other procedure: Secondary | ICD-10-CM | POA: Diagnosis present

## 2017-11-07 DIAGNOSIS — Y838 Other surgical procedures as the cause of abnormal reaction of the patient, or of later complication, without mention of misadventure at the time of the procedure: Secondary | ICD-10-CM | POA: Diagnosis present

## 2017-11-07 DIAGNOSIS — I129 Hypertensive chronic kidney disease with stage 1 through stage 4 chronic kidney disease, or unspecified chronic kidney disease: Secondary | ICD-10-CM | POA: Diagnosis present

## 2017-11-07 DIAGNOSIS — Z833 Family history of diabetes mellitus: Secondary | ICD-10-CM | POA: Diagnosis not present

## 2017-11-07 DIAGNOSIS — K766 Portal hypertension: Secondary | ICD-10-CM | POA: Diagnosis present

## 2017-11-07 DIAGNOSIS — K7031 Alcoholic cirrhosis of liver with ascites: Secondary | ICD-10-CM | POA: Diagnosis present

## 2017-11-07 DIAGNOSIS — K429 Umbilical hernia without obstruction or gangrene: Secondary | ICD-10-CM | POA: Diagnosis present

## 2017-11-07 DIAGNOSIS — Z853 Personal history of malignant neoplasm of breast: Secondary | ICD-10-CM | POA: Diagnosis not present

## 2017-11-07 DIAGNOSIS — K3189 Other diseases of stomach and duodenum: Secondary | ICD-10-CM | POA: Diagnosis present

## 2017-11-07 DIAGNOSIS — Z79899 Other long term (current) drug therapy: Secondary | ICD-10-CM | POA: Diagnosis not present

## 2017-11-07 DIAGNOSIS — N183 Chronic kidney disease, stage 3 (moderate): Secondary | ICD-10-CM | POA: Diagnosis present

## 2017-11-07 DIAGNOSIS — I160 Hypertensive urgency: Secondary | ICD-10-CM | POA: Diagnosis present

## 2017-11-07 DIAGNOSIS — S301XXA Contusion of abdominal wall, initial encounter: Secondary | ICD-10-CM | POA: Diagnosis present

## 2017-11-07 DIAGNOSIS — I8510 Secondary esophageal varices without bleeding: Secondary | ICD-10-CM | POA: Diagnosis present

## 2017-11-07 LAB — IRON AND TIBC
IRON: 47 ug/dL (ref 28–170)
Saturation Ratios: 16 % (ref 10.4–31.8)
TIBC: 300 ug/dL (ref 250–450)
UIBC: 253 ug/dL

## 2017-11-07 LAB — BASIC METABOLIC PANEL
Anion gap: 7 (ref 5–15)
BUN: 21 mg/dL — ABNORMAL HIGH (ref 6–20)
CALCIUM: 8 mg/dL — AB (ref 8.9–10.3)
CHLORIDE: 111 mmol/L (ref 98–111)
CO2: 23 mmol/L (ref 22–32)
CREATININE: 1.48 mg/dL — AB (ref 0.44–1.00)
GFR calc non Af Amer: 37 mL/min — ABNORMAL LOW (ref >60)
GFR, EST AFRICAN AMERICAN: 43 mL/min — AB (ref >60)
Glucose, Bld: 103 mg/dL — ABNORMAL HIGH (ref 70–99)
Potassium: 3.5 mmol/L (ref 3.5–5.1)
SODIUM: 141 mmol/L (ref 135–145)

## 2017-11-07 LAB — CBC
HCT: 31.2 % — ABNORMAL LOW (ref 36.0–46.0)
HEMATOCRIT: 28.7 % — AB (ref 36.0–46.0)
Hemoglobin: 9.3 g/dL — ABNORMAL LOW (ref 12.0–15.0)
Hemoglobin: 8.7 g/dL — ABNORMAL LOW (ref 12.0–15.0)
MCH: 25.3 pg — ABNORMAL LOW (ref 26.0–34.0)
MCH: 25.6 pg — ABNORMAL LOW (ref 26.0–34.0)
MCHC: 29.8 g/dL — ABNORMAL LOW (ref 30.0–36.0)
MCHC: 30.3 g/dL (ref 30.0–36.0)
MCV: 84.8 fL (ref 78.0–100.0)
MCV: 84.4 fL (ref 78.0–100.0)
PLATELETS: 115 10*3/uL — AB (ref 150–400)
PLATELETS: 104 10*3/uL — AB (ref 150–400)
RBC: 3.68 MIL/uL — AB (ref 3.87–5.11)
RBC: 3.4 MIL/uL — AB (ref 3.87–5.11)
RDW: 15.2 % (ref 11.5–15.5)
RDW: 15.3 % (ref 11.5–15.5)
WBC: 7.2 10*3/uL (ref 4.0–10.5)
WBC: 6.7 10*3/uL (ref 4.0–10.5)

## 2017-11-07 LAB — FERRITIN: Ferritin: 18 ng/mL (ref 11–307)

## 2017-11-07 LAB — RETICULOCYTES
RBC.: 3.68 MIL/uL — ABNORMAL LOW (ref 3.87–5.11)
Retic Count, Absolute: 47.8 10*3/uL (ref 19.0–186.0)
Retic Ct Pct: 1.3 % (ref 0.4–3.1)

## 2017-11-07 LAB — VITAMIN B12: VITAMIN B 12: 354 pg/mL (ref 180–914)

## 2017-11-07 LAB — HIV ANTIBODY (ROUTINE TESTING W REFLEX): HIV Screen 4th Generation wRfx: NONREACTIVE

## 2017-11-07 LAB — FOLATE: Folate: 15.2 ng/mL (ref >5.9)

## 2017-11-07 MED ORDER — OXYCODONE HCL 5 MG PO TABS: 5.0000 mg | ORAL_TABLET | ORAL | Status: DC | PRN

## 2017-11-07 MED ORDER — SENNA 8.6 MG PO TABS
1.0000 | ORAL_TABLET | Freq: Every day | ORAL | Status: DC
  Administered 2017-11-07 – 2017-11-08 (×2): 8.6 mg via ORAL
  Filled 2017-11-07 (×2): qty 1

## 2017-11-07 MED ORDER — HYDRALAZINE HCL 20 MG/ML IJ SOLN: 5.0000 mg | INTRAMUSCULAR | Status: DC | PRN

## 2017-11-07 MED ORDER — POLYETHYLENE GLYCOL 3350 17 G PO PACK
17.0000 g | PACK | Freq: Every day | ORAL | Status: DC
  Administered 2017-11-07 – 2017-11-08 (×2): 17 g via ORAL
  Filled 2017-11-07 (×2): qty 1

## 2017-11-07 MED ORDER — ONDANSETRON 4 MG PO TBDP
4.0000 mg | ORAL_TABLET | Freq: Three times a day (TID) | ORAL | Status: DC | PRN
  Administered 2017-11-07: 4 mg via ORAL
  Filled 2017-11-07: qty 1

## 2017-11-07 MED ORDER — LABETALOL HCL 5 MG/ML IV SOLN
5.0000 mg | Freq: Once | INTRAVENOUS | Status: AC
  Administered 2017-11-07: 5 mg via INTRAVENOUS
  Filled 2017-11-07: qty 4

## 2017-11-07 NOTE — Progress Notes (Signed)
Family Medicine Teaching Service Daily Progress Note Intern Pager: [phone]  Patient name: Alicia [name] Medical record number: [id number] Date of birth: Dec 22, 1955 Age: 62 y.o. Gender: female  Primary Care Provider: Milus Banister, MD Consultants: None  Code Status: Full   Pt Overview and Major Events to Date:  8/1 admitted   Assessment and Plan: Alicia [name] is a 62 y.o. female presenting with abdominal pain following paracentesis and ultimately found to have a right sided rectus sheath hematoma. PMH is significant for alcoholic cirrhosis with weekly paracentesis, HTN, anemia.   Rectus sheath hematoma:  5.4 x 8.6 x 8.4 cm noted on 8/1 CT. Pt endorses improvement in pain this morning, however this afternoon became nauseous with a few episodes of emesis. Nursing noted this occurred after receiving her Dilaudid. On exam, she is mildly tender to her right lower quadrant without any peritoneal signs. Over the course of today, her hemoglobin has remained stable around 9. We have discussed her case with interventional radiology, who will be coming to the see the patient.  -D/C Dilaudid, start oxycodone 5 mg PO every 4 as needed -CBC in the a.m. -Will provide abdominal binder on discharge  Chronic Hypertension: Patient takes amlodipine 5 mg and hydralazine 25 mg twice daily at home.  Her blood pressure has been elevated to max of 214/114 overnight, with most recent pressure 177/126.  Will give 1 dose of 5 mg labetalol IV now to help bring down her pressure, however limited dose due to intermittent bradycardia. -Give one time IV labetalol 5mg  now  -Hydralazine 25 mg 3 times daily -Amlodipine 5 mg daily -Monitor vitals  Alcoholic Cirrhosis w/ ascites: Stable. Per patient last drink was over 2 years ago.  Follows with Dr. Ardis Hughs.  Last appointment on 01/31/2017.  EGD on 11/2016 showed small varices, mild portal gastropathy.  Per note from Dr. Ardis Hughs she has shown repeatedly that she is  unable to follow-up with basic medical recommendations and is a poor candidate for transplant, she may be a good candidate for TIPS procedure though she has declined the suggestion on previous occasions.  It is unclear what she has been taking at home she denies regular use of Lasix.  Will try to reach out to GI to clarify medical regimen.  -Do not administer furosemide or Spironolactone -Follow-up with outpatient GI -continue biweekly paracentesis  Normocytic Anemia: Stable.  Acutely decreased in the setting of hematoma, however chronically anemic likely related to chronic liver disease. Does not currently take medication at home for anemia. -Iron panel wnl -Monitor CBC in the am   GERD: Patient has a history of GERD and currently takes omeprazole 40 mg at home. No symptoms at this time. -Pantoprazole 40 mg p.o.  FEN/GI: General diet Prophylaxis: SCDs  Disposition: Hopeful discharge in the am, if remains stable and better control of BP. IR to see tonight.   Subjective:  Patient endorses improvement in her abdominal pain this morning.  Denies any nausea or vomiting, was able to eat a full breakfast.   Update: This afternoon patient endorsed nausea and a few episodes of emesis following Dilaudid dose.  She was provided Zofran with some relief.  Objective: Temp:  [98.3 F (36.8 C)-98.9 F (37.2 C)] 98.3 F (36.8 C) (08/02 7858) Pulse Rate:  [58-93] 59 (08/02 0608) Resp:  [12-22] 13 (08/02 0608) BP: (150-214)/(77-121) 170/77 (08/02 0608) SpO2:  [97 %-100 %] 100 % (08/02 0608) Weight:  [165 lb (74.8 kg)-173 lb 8 oz (78.7 kg)] 173 lb  8 oz (78.7 kg) (08/01 2148) Physical Exam: General: Alert, NAD sitting comfortably watching TV HEENT: NCAT, MMM, oropharynx nonerythematous  Cardiac: RRR no m/g/r Lungs: Clear bilaterally, no increased WOB  Abdomen: soft, non-distended, normoactive BS, midline fluid-filled hernia noted supraumbilically.  Tenderness to palpation of right lower  quadrant, no skin changes noted over the area, not able to palpate any mass or hematoma.  Msk: Moves all extremities spontaneously  Ext: Warm, dry, 2+ distal pulses, no edema     Laboratory: Recent Labs  Lab 11/06/17 1731 11/06/17 2327  WBC 5.7  --   HGB 9.4* 8.7*  HCT 31.9* 28.5*  PLT 111*  --    Recent Labs  Lab 11/06/17 1731  NA 141  K 3.6  CL 112*  CO2 19*  BUN 24*  CREATININE 1.65*  CALCIUM 7.9*  PROT 6.6  BILITOT 0.7  ALKPHOS 107  ALT 8  AST 16  GLUCOSE 124*     Imaging/Diagnostic Tests: Ct Abdomen Pelvis W Contrast  Result Date: 11/06/2017 CLINICAL DATA:  Severe abdominal pain status post paracentesis EXAM: CT ABDOMEN AND PELVIS WITH CONTRAST TECHNIQUE: Multidetector CT imaging of the abdomen and pelvis was performed using the standard protocol following bolus administration of intravenous contrast. CONTRAST:  143mL OMNIPAQUE IOHEXOL 300 MG/ML  SOLN COMPARISON:  None. FINDINGS: Lower chest: Lung bases are clear. Hepatobiliary: Nodular hepatic contour, reflecting cirrhosis. No focal hepatic lesion is seen. 3 mm layering gallstone (series 3/image 32), without associated inflammatory changes. No intrahepatic or extrahepatic ductal dilatation. Pancreas: Within normal limits. Spleen: Within normal limits. Adrenals/Urinary Tract: Adrenal glands are within normal limits. Kidneys are within normal limits.  No hydronephrosis. Bladder is within normal limits. Stomach/Bowel: Stomach is within normal limits. No evidence of bowel obstruction. Appendix is not discretely visualized. Mild rectal stool burden. Vascular/Lymphatic: No evidence of abdominal aortic aneurysm. Atherosclerotic calcifications of the abdominal aorta and branch vessels. No suspicious abdominopelvic lymphadenopathy. Reproductive: Uterus is within normal limits. Bilateral ovaries are unremarkable. Other: Small volume residual abdominopelvic ascites. Small fat/fluid containing periumbilical hernia. Additional moderate  fat/fluid containing midline supraumbilical ventral hernia (series 3/image 50). Small fat/fluid containing left inguinal hernia and tiny fat containing right inguinal hernia. Musculoskeletal: Right rectus sheath hematoma measuring 5.4 x 8.6 x 8.4 cm (series 3/image 48). No evidence of active extravasation. No focal osseous lesions. IMPRESSION: Right rectus sheath hematoma measuring 5.4 x 8.6 x 8.4 cm. No evidence of active extravasation. Small volume residual abdominopelvic ascites. Additional ancillary findings as above. Electronically Signed   By: Julian Hy M.D.   On: 11/06/2017 19:36   Ir Paracentesis  Result Date: 11/06/2017 INDICATION: Recurrent ascites secondary to cirrhosis. Request for therapeutic paracentesis. EXAM: ULTRASOUND GUIDED THERAPEUTIC PARACENTESIS MEDICATIONS: 10 mL 2% lidocaine COMPLICATIONS: None immediate. PROCEDURE: Informed written consent was obtained from the patient after a discussion of the risks, benefits and alternatives to treatment. A timeout was performed prior to the initiation of the procedure. Initial ultrasound scanning demonstrates a large amount of ascites within the right lower abdominal quadrant. The right lower abdomen was prepped and draped in the usual sterile fashion. 2% was used for local anesthesia. Following this, a 19 gauge, 7-cm, Yueh catheter was introduced. An ultrasound image was saved for documentation purposes. The paracentesis was performed. The catheter was removed and a dressing was applied. The patient tolerated the procedure well without immediate post procedural complication. FINDINGS: A total of approximately 4.3L of yellow fluid was removed. Samples were not sent to the laboratory as requested by  the clinical team. IMPRESSION: Successful ultrasound-guided paracentesis yielding 4.3 liters of peritoneal fluid. Read by Candiss Norse, PA-C Electronically Signed   By: Markus Daft M.D.   On: 11/06/2017 15:23    Patriciaann Clan,  DO 11/07/2017, 6:58 AM PGY-1, [place] Intern pager: [phone], text pages welcome

## 2017-11-07 NOTE — Progress Notes (Addendum)
Supervising Physician: Jacqulynn Cadet  Patient Status:  [facility] - In-pt  Chief Complaint: Rectal sheath hematoma s/p paracentesis.  Subjective:  62 y/o F with PMH significant for ETOH cirrhosis with biweekly paracentesis, GERD, anemia, HTN. Patient presented to [facility] on 8/1 approximately 2 hours s/p paracentesis in IR with complaints of worsening RUQ pain. Patient had c/o pain in same area after standing post paracentesis and albumin administration, she was observed for 30 minutes in IR suite after paracentesis and pain had dissipated at that time, vital signs remained stable, no bleeding or fluid leakage noted from puncture site.  Patient ambulated independently out of IR suite without issue, however she stated that later she was running errands and her pain returned as well as new onset of some shortness of breath. She returned to ED as instructed for these worsening symptoms and was found to have a rectal sheath hematoma on CT measuring 5.4 x 8.6 x 8.4 cm as probable cause of her pain. Additionally, her hemoglobin was noted to be 9.4 with previous 10.7 (06/04/17). She was admitted to med-surg unit to monitor h/h as well as pain control.   Patient has been stable since admission, she reports during our visit today that her pain has improved except it still hurts when the area is pressed on. She also complains of ongoing constipation and would like a quicker acting laxative like the one she takes at home. She reports 1 episode of vomiting around 2 pm today after she took a bite of her lunch - she denies feeling nauseous but states "I don't know, I just bit the food and felt like I had to throw up so I did." She denies any other episodes of vomiting since, appetite has been good otherwise. She denies any dyspnea or chest pain.    Allergies: Compazine [prochlorperazine edisylate] and Meclizine  Medications: Prior to Admission medications   Medication Sig Start Date End Date Taking? Authorizing  Provider  aspirin-acetaminophen-caffeine (EXCEDRIN MIGRAINE) [phone] MG tablet Take 2 tablets by mouth every 8 (eight) hours as needed for headache.    Yes [provider]  diphenhydrAMINE (BENADRYL) 25 MG tablet Take 25 mg by mouth every 6 (six) hours as needed for itching.   Yes [provider]  hydrALAZINE (APRESOLINE) 25 MG tablet Take 1 tablet (25 mg total) by mouth 2 (two) times daily. Patient taking differently: Take 25 mg by mouth 3 (three) times daily.  08/08/16  Yes Barton Dubois, MD  magnesium oxide (MAG-OX) 400 MG tablet Take 400 mg by mouth as needed.    Yes [provider]  metoprolol tartrate (LOPRESSOR) 25 MG tablet Take 25 mg by mouth 2 (two) times daily.   Yes [provider]  omeprazole (PRILOSEC) 40 MG capsule Take 1 capsule (40 mg total) by mouth daily. Patient taking differently: Take 40 mg by mouth daily as needed (Heart burn).  08/08/16  Yes Barton Dubois, MD  amLODipine (NORVASC) 5 MG tablet Take 1 tablet (5 mg total) by mouth daily. Patient not taking: Reported on 11/07/2017 08/09/16   Barton Dubois, MD  hydrOXYzine (ATARAX/VISTARIL) 25 MG tablet Take 1 tablet (25 mg total) by mouth every 6 (six) hours as needed for itching. Patient not taking: Reported on 11/07/2017 06/06/17   Milus Banister, MD     Vital Signs: BP (!) 177/126 (BP Location: Left Arm)   Pulse 74   Temp 98 F (36.7 C) (Oral)   Resp 18   Ht 5' (1.524 m)  Wt 173 lb 8 oz (78.7 kg)   SpO2 98%   BMI 33.88 kg/m   Physical Exam  Constitutional: She is oriented to person, place, and time. She does not appear ill. No distress.  Patient sleeping upon entry to room, easily arouses to verbal cues. Pleasant and talkative during exam.  HENT:  Head: Normocephalic.  Eyes: No scleral icterus.  Cardiovascular: Normal rate and regular rhythm.  Pulmonary/Chest: Effort normal and breath sounds normal. She has no wheezes.  Abdominal: Soft. Normal appearance and bowel sounds are  normal. She exhibits ascites. There is tenderness (approximately 4 cm palpable area of induration to RUQ superior to paracentesis site) in the right lower quadrant. A hernia (umbilical) is present.  Neurological: She is alert and oriented to person, place, and time.  Skin: Skin is warm and dry. No rash noted.  Psychiatric: She has a normal mood and affect. Her behavior is normal.    Imaging: Ct Abdomen Pelvis W Contrast  Result Date: 11/06/2017 CLINICAL DATA:  Severe abdominal pain status post paracentesis EXAM: CT ABDOMEN AND PELVIS WITH CONTRAST TECHNIQUE: Multidetector CT imaging of the abdomen and pelvis was performed using the standard protocol following bolus administration of intravenous contrast. CONTRAST:  156mL OMNIPAQUE IOHEXOL 300 MG/ML  SOLN COMPARISON:  None. FINDINGS: Lower chest: Lung bases are clear. Hepatobiliary: Nodular hepatic contour, reflecting cirrhosis. No focal hepatic lesion is seen. 3 mm layering gallstone (series 3/image 32), without associated inflammatory changes. No intrahepatic or extrahepatic ductal dilatation. Pancreas: Within normal limits. Spleen: Within normal limits. Adrenals/Urinary Tract: Adrenal glands are within normal limits. Kidneys are within normal limits.  No hydronephrosis. Bladder is within normal limits. Stomach/Bowel: Stomach is within normal limits. No evidence of bowel obstruction. Appendix is not discretely visualized. Mild rectal stool burden. Vascular/Lymphatic: No evidence of abdominal aortic aneurysm. Atherosclerotic calcifications of the abdominal aorta and branch vessels. No suspicious abdominopelvic lymphadenopathy. Reproductive: Uterus is within normal limits. Bilateral ovaries are unremarkable. Other: Small volume residual abdominopelvic ascites. Small fat/fluid containing periumbilical hernia. Additional moderate fat/fluid containing midline supraumbilical ventral hernia (series 3/image 50). Small fat/fluid containing left inguinal hernia and  tiny fat containing right inguinal hernia. Musculoskeletal: Right rectus sheath hematoma measuring 5.4 x 8.6 x 8.4 cm (series 3/image 48). No evidence of active extravasation. No focal osseous lesions. IMPRESSION: Right rectus sheath hematoma measuring 5.4 x 8.6 x 8.4 cm. No evidence of active extravasation. Small volume residual abdominopelvic ascites. Additional ancillary findings as above. Electronically Signed   By: Julian Hy M.D.   On: 11/06/2017 19:36   Ir Paracentesis  Result Date: 11/06/2017 INDICATION: Recurrent ascites secondary to cirrhosis. Request for therapeutic paracentesis. EXAM: ULTRASOUND GUIDED THERAPEUTIC PARACENTESIS MEDICATIONS: 10 mL 2% lidocaine COMPLICATIONS: None immediate. PROCEDURE: Informed written consent was obtained from the patient after a discussion of the risks, benefits and alternatives to treatment. A timeout was performed prior to the initiation of the procedure. Initial ultrasound scanning demonstrates a large amount of ascites within the right lower abdominal quadrant. The right lower abdomen was prepped and draped in the usual sterile fashion. 2% was used for local anesthesia. Following this, a 19 gauge, 7-cm, Yueh catheter was introduced. An ultrasound image was saved for documentation purposes. The paracentesis was performed. The catheter was removed and a dressing was applied. The patient tolerated the procedure well without immediate post procedural complication. FINDINGS: A total of approximately 4.3L of yellow fluid was removed. Samples were not sent to the laboratory as requested by the clinical team. IMPRESSION: Successful  ultrasound-guided paracentesis yielding 4.3 liters of peritoneal fluid. Read by Candiss Norse, PA-C Electronically Signed   By: Markus Daft M.D.   On: 11/06/2017 15:23    Labs:  CBC: Recent Labs    06/04/17 0149 11/06/17 1731 11/06/17 2327 11/07/17 0621 11/07/17 1304  WBC 5.1 5.7  --  7.2 6.7  HGB 10.7* 9.4* 8.7* 9.3*  8.7*  HCT 33.9* 31.9* 28.5* 31.2* 28.7*  PLT 179 111*  --  115* 104*    COAGS: Recent Labs    12/11/16 1011 01/31/17 1101 06/04/17 0149 11/06/17 1731  INR 1.0 1.0 1.00 1.14    BMP: Recent Labs    02/17/17 1249 06/04/17 0149 11/06/17 1731 11/07/17 0621  NA 139 134* 141 141  K 3.2* 5.3* 3.6 3.5  CL 105 106 112* 111  CO2 24 20* 19* 23  GLUCOSE 131* 109* 124* 103*  BUN 35* 42* 24* 21*  CALCIUM 8.8 8.8* 7.9* 8.0*  CREATININE 1.84* 1.42* 1.65* 1.48*  GFRNONAA  --  39* 33* 37*  GFRAA  --  45* 38* 43*    LIVER FUNCTION TESTS: Recent Labs    12/11/16 1011 01/31/17 1056 06/04/17 0149 11/06/17 1731  BILITOT 0.3 0.4 0.7 0.7  AST 21 20 35 16  ALT 14 9 22 8   ALKPHOS 133* 149* 161* 107  PROT 6.6 7.0 8.6* 6.6  ALBUMIN 3.3* 3.6 3.6 3.6    Assessment and Plan:  Rectal sheath hematoma s/p paracentesis on 8/1 - patient presented to [facility] approximately 2 hours after paracentesis with worsening RUQ pain after running errands. She was found to have rectal sheath hematoma as likely cause of pain as well as anemia which is chronic. She was admitted for observation and pain control.  Pain improved today, hematoma appears stable in size on exam, patient remains hemodynamically stable. No interventions planned in IR at this point - hematoma likely to resolve with rest and pain control as outpatient. To be d/ced per admitting service.  Will follow up with patient at next paracentesis appointment.   Electronically Signed: Joaquim Nam, PA-C 11/07/2017, 3:57 PM   I spent a total of 15 Minutes at the the patient's bedside AND on the patient's hospital floor or unit, greater than 50% of which was counseling/coordinating care for rectal sheath hematoma.

## 2017-11-08 DIAGNOSIS — S301XXD Contusion of abdominal wall, subsequent encounter: Secondary | ICD-10-CM

## 2017-11-08 LAB — CBC WITH DIFFERENTIAL/PLATELET
Abs Immature Granulocytes: 0 10*3/uL (ref 0.0–0.1)
BASOS PCT: 0 %
Basophils Absolute: 0 10*3/uL (ref 0.0–0.1)
Eosinophils Absolute: 0.2 10*3/uL (ref 0.0–0.7)
Eosinophils Relative: 3 %
HEMATOCRIT: 30.3 % — AB (ref 36.0–46.0)
HEMOGLOBIN: 9 g/dL — AB (ref 12.0–15.0)
LYMPHS PCT: 31 %
Lymphs Abs: 1.8 10*3/uL (ref 0.7–4.0)
MCH: 25.2 pg — ABNORMAL LOW (ref 26.0–34.0)
MCHC: 29.7 g/dL — ABNORMAL LOW (ref 30.0–36.0)
MCV: 84.9 fL (ref 78.0–100.0)
Monocytes Absolute: 0.5 10*3/uL (ref 0.1–1.0)
Monocytes Relative: 9 %
NEUTROS ABS: 3.4 10*3/uL (ref 1.7–7.7)
Neutrophils Relative %: 57 %
Platelets: 96 10*3/uL — ABNORMAL LOW (ref 150–400)
RBC: 3.57 MIL/uL — AB (ref 3.87–5.11)
RDW: 15.2 % (ref 11.5–15.5)
WBC: 5.9 10*3/uL (ref 4.0–10.5)

## 2017-11-08 LAB — BASIC METABOLIC PANEL
ANION GAP: 7 (ref 5–15)
BUN: 24 mg/dL — ABNORMAL HIGH (ref 6–20)
CALCIUM: 7.8 mg/dL — AB (ref 8.9–10.3)
CHLORIDE: 111 mmol/L (ref 98–111)
CO2: 22 mmol/L (ref 22–32)
Creatinine, Ser: 1.73 mg/dL — ABNORMAL HIGH (ref 0.44–1.00)
GFR calc non Af Amer: 31 mL/min — ABNORMAL LOW (ref >60)
GFR, EST AFRICAN AMERICAN: 36 mL/min — AB (ref >60)
Glucose, Bld: 96 mg/dL (ref 70–99)
POTASSIUM: 3.8 mmol/L (ref 3.5–5.1)
Sodium: 140 mmol/L (ref 135–145)

## 2017-11-08 MED ORDER — OXYCODONE HCL 5 MG PO TABS: 5.0000 mg | ORAL_TABLET | Freq: Four times a day (QID) | ORAL | 0 refills | Status: DC | PRN

## 2017-11-08 MED ORDER — HYDRALAZINE HCL 25 MG PO TABS: 25.0000 mg | ORAL_TABLET | Freq: Three times a day (TID) | ORAL | 0 refills | Status: DC

## 2017-11-08 MED ORDER — AMLODIPINE BESYLATE 5 MG PO TABS: 5.0000 mg | ORAL_TABLET | Freq: Every day | ORAL | 0 refills | Status: DC

## 2017-11-08 MED ORDER — METOPROLOL TARTRATE 25 MG PO TABS: 25.0000 mg | ORAL_TABLET | Freq: Two times a day (BID) | ORAL | 0 refills | Status: DC

## 2017-11-08 NOTE — Discharge Instructions (Signed)
You were seen at [facility] for abdominal pain after your paracentesis, that was found to be a blood pocket on the right of your belly. Your blood count has remained stable. This blood pocket will heal over time, however in the meantime we have given you some pain medications and belly binder to help ease the soreness. Your blood pressure was also elevated when you arrived, but is now normal. Please make sure you take your blood pressure medications. Please make sure you follow up with your GI doctor and establish a primary care provider to manage your chronic conditions. Glad you are feeling better!

## 2017-11-08 NOTE — Progress Notes (Signed)
Orthopedic Tech Progress Note Patient Details:  Alicia [name] 1956/03/09 [id number] Product was taking to the room. Patient ID: Alicia [name], female   DOB: Nov 07, 1955, 62 y.o.   MRN: [id number]   Ladell Pier [facility] 11/08/2017, 1:15 PM

## 2017-11-08 NOTE — Discharge Summary (Signed)
[facility] Discharge Summary  Patient name: Alicia [name] Medical record number: [id number] Date of birth: 02/18/1956 Age: 62 y.o. Gender: female Date of Admission: 11/06/2017  Date of Discharge: 11/08/2017 Admitting Physician: Leeanne Rio, MD  Primary Care Provider: Milus Banister, MD Consultants: Intervention Radiology   Indication for Hospitalization: Rectus Sheath hematoma, hypertensive urgency   Discharge Diagnoses/Problem List:  Rectus Sheath Hematoma Alcoholic liver cirrhosis  CKD III Hypertension Anemia of chronic disease GERD Portal hypertension  Esophageal varices without bleeding   Disposition: Home   Discharge Condition: Stable   Discharge Exam:  General: Alert, NAD HEENT: NCAT, MMM, oropharynx nonerythematous  Cardiac: RRR no m/g/r Lungs: Clear bilaterally, no increased WOB  Abdomen: soft, distended, normoactive BS, tender to RLQ, without voluntary guarding or rigidity. Palpable fluid filled hernia supraumbilically.  Msk: Moves all extremities spontaneously  Ext: Warm, dry, 2+ distal pulses, no edema    Brief Hospital Course:   Rectal Sheath Hematoma:  Alicia [name] is a 62 y.o. female with a past history notable for liver cirrhosis receiving biweekly paracentesis that presented with acute onset RLQ abdominal pain following a routine paracentesis on 8/1. On presentation, she was hypertensive to 829-562'Z systolic with significant abdominal pain. Initial Hgb 9.4, previously 10.7. CT abdomen showed a right sided 5.4 x 8.6 x 8.4cm rectal sheath hematoma. Throughout her stay, her hemoglobin remained stable around 9. Interventional radiology evaluated the patient and recommended no intervention. At discharge, she notes improvement of her abdominal pain, however continues to be sore. She was sent home 5 additional doses of oxycodone 5mg  for her pain and an abdominal binder.   Hypertension: Elevated during admission, with a max of  214/114. Initially presented very anxious and in severe pain due to hematoma. Cr 1.65, below her baseline of around 2. Received IV labetalol 20mg  in the ED. On admission, she was started on her home medications hydralazine 25mg  TID and amlodipine 5mg , which she had not been able to take for the past few days prior to arrival. Her blood pressure continued to remain elevated, so she received a 1 time dose of labetalol 5mg  on 8/2 with improvement. On day of discharge, her blood pressure began to normalize, however still slightly elevated around 308'M systolic.    Alcoholic liver cirrhosis with ascites: Pt follows with Dr. Ardis Hughs, last seen in 01/2017. Poor candidate for transplant, however considered for TIPS previously but pt declined. She notes she has been taking an unknown dose of lasix, but has not taken spironolactone due to cost. These medications were not given during admission. She also receives biweekly therapeutic paracentesis. Recommended she follow up with her GI for appropriate medical management.     All of her remaining chronic conditions were treated appropriately with her home medications.    Issues for Follow Up:  1. Please follow her blood pressure and optimize regimen.  2. Recheck BMP for her creatinine and CBC for hemoglobin, given new abdominal hematoma.  3. Normocytic anemia: Hgb 9.0 at discharge, MCV 84. Ferritin 18. Could consider iron supplementation.    4. Ensure follow up with GI physician, Dr. Ardis Hughs, for liver cirrhosis.   Significant Procedures: None   Significant Labs and Imaging:  Recent Labs  Lab 11/07/17 0621 11/07/17 1304 11/08/17 0536  WBC 7.2 6.7 5.9  HGB 9.3* 8.7* 9.0*  HCT 31.2* 28.7* 30.3*  PLT 115* 104* 96*   Recent Labs  Lab 11/06/17 1731 11/07/17 0621 11/08/17 0536  NA 141 141 140  K 3.6  3.5 3.8  CL 112* 111 111  CO2 19* 23 22  GLUCOSE 124* 103* 96  BUN 24* 21* 24*  CREATININE 1.65* 1.48* 1.73*  CALCIUM 7.9* 8.0* 7.8*  ALKPHOS 107  --    --   AST 16  --   --   ALT 8  --   --   ALBUMIN 3.6  --   --    Ct Abdomen Pelvis W Contrast  Result Date: 11/06/2017 CLINICAL DATA:  Severe abdominal pain status post paracentesis EXAM: CT ABDOMEN AND PELVIS WITH CONTRAST TECHNIQUE: Multidetector CT imaging of the abdomen and pelvis was performed using the standard protocol following bolus administration of intravenous contrast. CONTRAST:  142mL OMNIPAQUE IOHEXOL 300 MG/ML  SOLN COMPARISON:  None. FINDINGS: Lower chest: Lung bases are clear. Hepatobiliary: Nodular hepatic contour, reflecting cirrhosis. No focal hepatic lesion is seen. 3 mm layering gallstone (series 3/image 32), without associated inflammatory changes. No intrahepatic or extrahepatic ductal dilatation. Pancreas: Within normal limits. Spleen: Within normal limits. Adrenals/Urinary Tract: Adrenal glands are within normal limits. Kidneys are within normal limits.  No hydronephrosis. Bladder is within normal limits. Stomach/Bowel: Stomach is within normal limits. No evidence of bowel obstruction. Appendix is not discretely visualized. Mild rectal stool burden. Vascular/Lymphatic: No evidence of abdominal aortic aneurysm. Atherosclerotic calcifications of the abdominal aorta and branch vessels. No suspicious abdominopelvic lymphadenopathy. Reproductive: Uterus is within normal limits. Bilateral ovaries are unremarkable. Other: Small volume residual abdominopelvic ascites. Small fat/fluid containing periumbilical hernia. Additional moderate fat/fluid containing midline supraumbilical ventral hernia (series 3/image 50). Small fat/fluid containing left inguinal hernia and tiny fat containing right inguinal hernia. Musculoskeletal: Right rectus sheath hematoma measuring 5.4 x 8.6 x 8.4 cm (series 3/image 48). No evidence of active extravasation. No focal osseous lesions. IMPRESSION: Right rectus sheath hematoma measuring 5.4 x 8.6 x 8.4 cm. No evidence of active extravasation. Small volume residual  abdominopelvic ascites. Additional ancillary findings as above. Electronically Signed   By: Julian Hy M.D.   On: 11/06/2017 19:36   Ir Paracentesis  Result Date: 11/06/2017 INDICATION: Recurrent ascites secondary to cirrhosis. Request for therapeutic paracentesis. EXAM: ULTRASOUND GUIDED THERAPEUTIC PARACENTESIS MEDICATIONS: 10 mL 2% lidocaine COMPLICATIONS: None immediate. PROCEDURE: Informed written consent was obtained from the patient after a discussion of the risks, benefits and alternatives to treatment. A timeout was performed prior to the initiation of the procedure. Initial ultrasound scanning demonstrates a large amount of ascites within the right lower abdominal quadrant. The right lower abdomen was prepped and draped in the usual sterile fashion. 2% was used for local anesthesia. Following this, a 19 gauge, 7-cm, Yueh catheter was introduced. An ultrasound image was saved for documentation purposes. The paracentesis was performed. The catheter was removed and a dressing was applied. The patient tolerated the procedure well without immediate post procedural complication. FINDINGS: A total of approximately 4.3L of yellow fluid was removed. Samples were not sent to the laboratory as requested by the clinical team. IMPRESSION: Successful ultrasound-guided paracentesis yielding 4.3 liters of peritoneal fluid. Read by Candiss Norse, PA-C Electronically Signed   By: Markus Daft M.D.   On: 11/06/2017 15:23     Results/Tests Pending at Time of Discharge: none  Discharge Medications:  Allergies as of 11/08/2017      Reactions   Compazine [prochlorperazine Edisylate] Anxiety   Meclizine Anxiety      Medication List    STOP taking these medications   aspirin-acetaminophen-caffeine 250-250-65 MG tablet Commonly known as:  EXCEDRIN MIGRAINE   hydrOXYzine  25 MG tablet Commonly known as:  ATARAX/VISTARIL     TAKE these medications   amLODipine 5 MG tablet Commonly known as:   NORVASC Take 1 tablet (5 mg total) by mouth daily.   diphenhydrAMINE 25 MG tablet Commonly known as:  BENADRYL Take 25 mg by mouth every 6 (six) hours as needed for itching.   hydrALAZINE 25 MG tablet Commonly known as:  APRESOLINE Take 1 tablet (25 mg total) by mouth 3 (three) times daily.   magnesium oxide 400 MG tablet Commonly known as:  MAG-OX Take 400 mg by mouth as needed.   metoprolol tartrate 25 MG tablet Commonly known as:  LOPRESSOR Take 1 tablet (25 mg total) by mouth 2 (two) times daily.   omeprazole 40 MG capsule Commonly known as:  PRILOSEC Take 1 capsule (40 mg total) by mouth daily. What changed:    when to take this  reasons to take this   oxyCODONE 5 MG immediate release tablet Commonly known as:  Oxy IR/ROXICODONE Take 1 tablet (5 mg total) by mouth every 6 (six) hours as needed for up to 5 doses for moderate pain.       Discharge Instructions: Please refer to Patient Instructions section of EMR for full details.  Patient was counseled important signs and symptoms that should prompt return to medical care, changes in medications, dietary instructions, activity restrictions, and follow up appointments.   Follow-Up Appointments: Follow-up Information    Milus Banister, MD. Schedule an appointment as soon as possible for a visit.   Specialty:  Gastroenterology Contact information: [address] [place] [address] [phone]        [facility]. Schedule an appointment as soon as possible for a visit.   Why:  Call [facility] to arrange appointment to establish as a patient.  Contact information: [place] [phone] [place], [place], DO 11/10/2017, 4:09 AM PGY-1, [place]

## 2017-11-08 NOTE — Care Management Note (Signed)
Case Management Note  Patient Details  Name: Alicia [name] MRN: [id number] Date of Birth: 1955-04-21  Subjective/Objective:  63 yr old female admitted with  Rectal sheath hematoma s/p paracentesis on 8/1.                  Action/Plan: Case manager was asked to provide patient with PCP. CM spoke with patient and gave her written information to contact [facility] on Monday to arrange appointment. CM also informed Dr.that no appointment can be scheduled on the weekend.     Expected Discharge Date:  11/08/17              Expected Discharge Plan:  Home/Self Care  In-House Referral:  NA  Discharge planning Services  CM Consult, [facility]  Post Acute Care Choice:  NA Choice offered to:  Patient  DME Arranged:  N/A DME Agency:  NA  HH Arranged:  NA HH Agency:  NA  Status of Service:  Completed, signed off  If discussed at [place] of Stay Meetings, dates discussed:    Additional Comments:  Ninfa Meeker, RN 11/08/2017, 12:34 PM

## 2017-11-20 ENCOUNTER — Encounter (HOSPITAL_COMMUNITY): Payer: Self-pay | Admitting: Radiology

## 2017-11-20 ENCOUNTER — Ambulatory Visit (HOSPITAL_COMMUNITY)
Admission: RE | Admit: 2017-11-20 | Discharge: 2017-11-20 | Disposition: A | Discharge status: Home / Self Care | Payer: Medicaid Other | Source: Ambulatory Visit | Attending: Gastroenterology | Admitting: Gastroenterology

## 2017-11-20 DIAGNOSIS — R188 Other ascites: Secondary | ICD-10-CM | POA: Diagnosis not present

## 2017-11-20 DIAGNOSIS — K7031 Alcoholic cirrhosis of liver with ascites: Secondary | ICD-10-CM

## 2017-11-20 HISTORY — PX: IR PARACENTESIS: IMG2679

## 2017-11-20 MED ORDER — ALBUMIN HUMAN 25 % IV SOLN
50.0000 g | Freq: Once | INTRAVENOUS | Status: AC
  Administered 2017-11-20: 50 g via INTRAVENOUS
  Filled 2017-11-20: qty 200

## 2017-11-20 MED ORDER — LIDOCAINE HCL (PF) 2 % IJ SOLN
INTRAMUSCULAR | Status: DC | PRN
  Administered 2017-11-20: 10 mL

## 2017-11-20 MED ORDER — LIDOCAINE HCL (PF) 2 % IJ SOLN
INTRAMUSCULAR | Status: AC
  Filled 2017-11-20: qty 20

## 2017-11-20 MED ORDER — ALBUMIN HUMAN 25 % IV SOLN
INTRAVENOUS | Status: AC
  Administered 2017-11-20: 50 g via INTRAVENOUS
  Filled 2017-11-20: qty 200

## 2017-11-20 NOTE — Procedures (Signed)
   US guided LLQ Paracentesis  4.3L light amber fluid  Tolerated well

## 2017-11-28 ENCOUNTER — Encounter: Payer: Self-pay | Admitting: Gastroenterology

## 2017-11-28 ENCOUNTER — Other Ambulatory Visit (INDEPENDENT_AMBULATORY_CARE_PROVIDER_SITE_OTHER): Payer: Medicaid Other

## 2017-11-28 ENCOUNTER — Ambulatory Visit (INDEPENDENT_AMBULATORY_CARE_PROVIDER_SITE_OTHER): Payer: Medicaid Other | Admitting: Gastroenterology

## 2017-11-28 VITALS — BP 136/64 | HR 74 | Ht 60.0 in | Wt 184.2 lb

## 2017-11-28 DIAGNOSIS — K7031 Alcoholic cirrhosis of liver with ascites: Secondary | ICD-10-CM | POA: Diagnosis not present

## 2017-11-28 LAB — BASIC METABOLIC PANEL
BUN: 28 mg/dL — ABNORMAL HIGH (ref 6–23)
CALCIUM: 8.7 mg/dL (ref 8.4–10.5)
CO2: 22 meq/L (ref 19–32)
CREATININE: 1.49 mg/dL — AB (ref 0.40–1.20)
Chloride: 108 mEq/L (ref 96–112)
GFR: 45.8 mL/min — AB (ref >60.00)
GLUCOSE: 103 mg/dL — AB (ref 70–99)
Potassium: 4.1 mEq/L (ref 3.5–5.1)
SODIUM: 138 meq/L (ref 135–145)

## 2017-11-28 LAB — CBC WITH DIFFERENTIAL/PLATELET
BASOS ABS: 0.1 10*3/uL (ref 0.0–0.1)
Basophils Relative: 1.2 % (ref 0.0–3.0)
EOS ABS: 0.2 10*3/uL (ref 0.0–0.7)
Eosinophils Relative: 2.6 % (ref 0.0–5.0)
HCT: 32.9 % — ABNORMAL LOW (ref 36.0–46.0)
Hemoglobin: 10.5 g/dL — ABNORMAL LOW (ref 12.0–15.0)
LYMPHS PCT: 19.5 % (ref 12.0–46.0)
Lymphs Abs: 1.2 10*3/uL (ref 0.7–4.0)
MCHC: 31.8 g/dL (ref 30.0–36.0)
MCV: 81.3 fl (ref 78.0–100.0)
MONOS PCT: 7.7 % (ref 3.0–12.0)
Monocytes Absolute: 0.5 10*3/uL (ref 0.1–1.0)
NEUTROS ABS: 4.2 10*3/uL (ref 1.4–7.7)
NEUTROS PCT: 69 % (ref 43.0–77.0)
PLATELETS: 182 10*3/uL (ref 150.0–400.0)
RBC: 4.04 Mil/uL (ref 3.87–5.11)
RDW: 16.2 % — ABNORMAL HIGH (ref 11.5–15.5)
WBC: 6.1 10*3/uL (ref 4.0–10.5)

## 2017-11-28 NOTE — Progress Notes (Signed)
Review of pertinent gastrointestinal problems: 1. 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 MELD 12 (11/2017 labs)Moved to [facility] 2018, presented with tense ascites. This is been a problem for her for about one year prior area [place], never saw gastroenterologist or hepatologist. Presenting labs: T Bili,platelets, INR all normal, creatinine 2.1;  Labs workup for cirrhosis: 10/2016: Alpha I antitrypsin level normal, ceruloplasm normal, Hep A total A negative. ASMA neg, Ferritin 12, ANA + (1:40 titer), Hepatitis C antibody negative, hepatitis B surface antigen negative, hepatitis B surface antibody negative.  AFP 08/2016 normal  EGD 11/2016 small varices, mild portal gastropathy, started nadolol 40mg  once daily  Liver imaging: 11/2017 CT scan abd pelv with IV contrast: liver nodular, no focal lesions,   Repeated LVPs (ranging 6-8 liters, followed by albumin): SAAG not yet checked, No SBP; instituted serial LVPs (+albumin) every two weeks starting mid July. Have discussed TIPS several times, she is not interested.  Completely stoped drinking alcohol as of August 2018. Suffered rectus sheath hematoma requiring hosp admission after LVP 11/2017.  Hepatitis A/B immunization series started 2018  She has shown repeatedly that she is unable to follow up with basic medical recommendations including lab draws, basic knowledge of her due to diuretic doses.   HPI: This is a very pleasant 62 yo woman whom I last saw here in the office about 10 months ago.  I am not sure why she did not follow-up before this.  She has a history of noncompliance.  Chief complaint is cirrhosis, refractory ascites  Has been getting LVPs every other Thursday via IR (4-5 liters).  At last OV 10 months ago she was taking lasix 60mg  daily and spironalactone 75 BID.  She is not currently taking any diuretics.  She stopped both of them 8-9 months ago.  Due to cost and other various  reasons.  ROS: complete GI ROS as described in HPI, all other review negative.  Constitutional:  No unintentional weight loss  Weight up 21 pounds since last OV here same scale 01/2017   Past Medical History:  Diagnosis Date  . Ascites   . Breast cancer ([place])   . Cancer ([place])   . Chronic anticoagulation   . Cirrhosis ([place])   . Dyspnea    with fluid buildup   . GERD (gastroesophageal reflux disease)   . Hypertension   . Hypotension     Past Surgical History:  Procedure Laterality Date  . BREAST LUMPECTOMY    . BREAST SURGERY    . CESAREAN SECTION    . ESOPHAGOGASTRODUODENOSCOPY (EGD) WITH PROPOFOL N/A 11/21/2016   Procedure: ESOPHAGOGASTRODUODENOSCOPY;  Surgeon: Milus Banister, MD;  Location: [facility];  Service: Endoscopy;  Laterality: N/A;  . IR PARACENTESIS  09/04/2016  . IR PARACENTESIS  09/20/2016  . IR PARACENTESIS  10/22/2016  . IR PARACENTESIS  01/27/2017  . IR PARACENTESIS  04/11/2017  . IR PARACENTESIS  06/05/2017  . IR PARACENTESIS  06/05/2017  . IR PARACENTESIS  06/19/2017  . IR PARACENTESIS  07/03/2017  . IR PARACENTESIS  07/17/2017  . IR PARACENTESIS  08/01/2017  . IR PARACENTESIS  08/14/2017  . IR PARACENTESIS  08/28/2017  . IR PARACENTESIS  09/11/2017  . IR PARACENTESIS  09/25/2017  . IR PARACENTESIS  10/08/2017  . IR PARACENTESIS  10/23/2017  . IR PARACENTESIS  11/06/2017  . IR PARACENTESIS  11/20/2017  . LYMPH NODE DISSECTION      Current Outpatient Medications  Medication Sig  Dispense Refill  . amLODipine (NORVASC) 5 MG tablet Take 1 tablet (5 mg total) by mouth daily. 90 tablet 0  . diphenhydrAMINE (BENADRYL) 25 MG tablet Take 25 mg by mouth every 6 (six) hours as needed for itching.    . hydrALAZINE (APRESOLINE) 25 MG tablet Take 1 tablet (25 mg total) by mouth 3 (three) times daily. 270 tablet 0  . magnesium oxide (MAG-OX) 400 MG tablet Take 400 mg by mouth as needed.     . metoprolol tartrate (LOPRESSOR) 25 MG tablet Take 1 tablet (25 mg total) by mouth 2  (two) times daily. 60 tablet 0  . omeprazole (PRILOSEC) 40 MG capsule Take 1 capsule (40 mg total) by mouth daily. (Patient taking differently: Take 40 mg by mouth daily as needed (Heart burn). ) 30 capsule 1  . oxyCODONE (OXY IR/ROXICODONE) 5 MG immediate release tablet Take 1 tablet (5 mg total) by mouth every 6 (six) hours as needed for up to 5 doses for moderate pain. 5 tablet 0   No current facility-administered medications for this visit.     Allergies as of 11/28/2017 - Review Complete 11/28/2017  Allergen Reaction Noted  . Compazine [prochlorperazine edisylate] Anxiety 08/07/2016  . Meclizine Anxiety 08/07/2016    Family History  Problem Relation Age of Onset  . Diabetes Mother   . Diabetes Father   . Diabetes Brother   . Colon cancer Neg Hx   . Stomach cancer Neg Hx   . Esophageal cancer Neg Hx     Social History   Socioeconomic History  . Marital status: Single    Spouse name: Not on file  . Number of children: Not on file  . Years of education: Not on file  . Highest education level: Not on file  Occupational History  . Not on file  Social Needs  . Financial resource strain: Not on file  . Food insecurity:    Worry: Not on file    Inability: Not on file  . Transportation needs:    Medical: Not on file    Non-medical: Not on file  Tobacco Use  . Smoking status: Never Smoker  . Smokeless tobacco: Never Used  Substance and Sexual Activity  . Alcohol use: No    Comment: occassional wine   . Drug use: No  . Sexual activity: Not on file  Lifestyle  . Physical activity:    Days per week: Not on file    Minutes per session: Not on file  . Stress: Not on file  Relationships  . Social connections:    Talks on phone: Not on file    Gets together: Not on file    Attends religious service: Not on file    Active member of club or organization: Not on file    Attends meetings of clubs or organizations: Not on file    Relationship status: Not on file  .  Intimate partner violence:    Fear of current or ex partner: Not on file    Emotionally abused: Not on file    Physically abused: Not on file    Forced sexual activity: Not on file  Other Topics Concern  . Not on file  Social History Narrative  . Not on file     Physical Exam: BP 136/64   Pulse 74   Ht 5' (1.524 m)   Wt 184 lb 3.2 oz (83.6 kg)   BMI 35.97 kg/m  Constitutional: Chronically ill-appearing Psychiatric: alert and oriented x3,  no signs of encephalopathy Lungs are clear to auscultation bilaterally Abdomen: soft, nontender, nondistended, massive ascites,  no peritoneal signs, normal bowel sounds No peripheral edema noted in lower extremities  Assessment and plan: 62 y.o. 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  Diuretic dosing is limited by renal insufficiency.  She is also chronically intermittently noncompliant with medicines and in fact has not taken diuretics in about 8 or 9 months now.  Even while she was on diuretics she had refractory ascites as the dose was quite limited by her renal insufficiency.  She has been getting large volume paracentesis every other week 5 or 6 L, sometimes she will get albumin infusions depending on her blood pressure at the time.  She suffered rectus sheath hematoma earlier this month and was in the hospital for 3 days.  Today we again discussed that she would probably be a very good candidate for TIPS procedure for her refractory ascites.  She is nervous about the possibility of encephalopathy but I told her that she it would be at lower risk than many for it given her overall parameters, bilirubin normal, no history of encephalopathy, etc.  She is at least now agreeable to sit down with interventional radiology to consider a TIPS.  For now she will continue large volume paracentesis every 2 weeks.  She will have basic labs checked today including CBC, complete metabolic profile,  alpha-fetoprotein.  I am going to get her back on diuretics if creatinine allows.  She will return to see me in 2 months and sooner as needed.  Please see the "Patient Instructions" section for addition details about the plan.  Owens Loffler, MD [facility] 11/28/2017, 1:54 PM

## 2017-11-28 NOTE — Patient Instructions (Addendum)
You will have labs checked today in the basement lab.  Please head down after you check out with the front desk  (cbc, bmet, AFP for hepatoma screening). Will consider restarting diuretics after seeing the lab results.  Referral to IR to consider TIPS procedure for refractory ascites.  Please return to see Dr. Ardis Hughs in 2 months. On 01/28/18 at 2:15pm  Continue every other week LVP 5 liters max for now.

## 2017-12-01 ENCOUNTER — Other Ambulatory Visit: Payer: Self-pay | Admitting: Gastroenterology

## 2017-12-01 DIAGNOSIS — K7031 Alcoholic cirrhosis of liver with ascites: Secondary | ICD-10-CM

## 2017-12-02 LAB — AFP TUMOR MARKER: AFP TUMOR MARKER: 4 ng/mL

## 2017-12-04 ENCOUNTER — Ambulatory Visit (HOSPITAL_COMMUNITY)
Admission: RE | Admit: 2017-12-04 | Discharge: 2017-12-04 | Disposition: A | Discharge status: Home / Self Care | Payer: Medicaid Other | Source: Ambulatory Visit | Attending: Gastroenterology | Admitting: Gastroenterology

## 2017-12-04 ENCOUNTER — Encounter (HOSPITAL_COMMUNITY): Payer: Self-pay | Admitting: Radiology

## 2017-12-04 DIAGNOSIS — K7031 Alcoholic cirrhosis of liver with ascites: Secondary | ICD-10-CM

## 2017-12-04 DIAGNOSIS — Z853 Personal history of malignant neoplasm of breast: Secondary | ICD-10-CM | POA: Insufficient documentation

## 2017-12-04 DIAGNOSIS — R188 Other ascites: Secondary | ICD-10-CM | POA: Insufficient documentation

## 2017-12-04 DIAGNOSIS — K7460 Unspecified cirrhosis of liver: Secondary | ICD-10-CM | POA: Diagnosis not present

## 2017-12-04 HISTORY — PX: IR PARACENTESIS: IMG2679

## 2017-12-04 MED ORDER — ALBUMIN HUMAN 25 % IV SOLN
50.0000 g | Freq: Once | INTRAVENOUS | Status: AC
  Administered 2017-12-04: 50 g via INTRAVENOUS
  Filled 2017-12-04: qty 200

## 2017-12-04 MED ORDER — LIDOCAINE HCL (PF) 2 % IJ SOLN
INTRAMUSCULAR | Status: DC | PRN
  Administered 2017-12-04: 10 mL

## 2017-12-04 MED ORDER — ALBUMIN HUMAN 25 % IV SOLN
INTRAVENOUS | Status: AC
  Filled 2017-12-04: qty 50

## 2017-12-04 MED ORDER — ALBUMIN HUMAN 25 % IV SOLN
INTRAVENOUS | Status: AC
  Filled 2017-12-04: qty 150

## 2017-12-04 MED ORDER — LIDOCAINE HCL (PF) 2 % IJ SOLN
INTRAMUSCULAR | Status: AC
  Filled 2017-12-04: qty 20

## 2017-12-04 NOTE — Procedures (Signed)
Ultrasound-guided  therapeutic paracentesis performed yielding 5.1 liters of yellow  fluid. No immediate complications. The pt will receive IV albumin postprocedure.

## 2017-12-09 ENCOUNTER — Other Ambulatory Visit: Payer: Self-pay

## 2017-12-09 ENCOUNTER — Other Ambulatory Visit: Payer: Medicaid Other

## 2017-12-09 MED ORDER — FUROSEMIDE 40 MG PO TABS: 60.0000 mg | ORAL_TABLET | Freq: Every day | ORAL | 3 refills | Status: DC

## 2017-12-09 MED ORDER — SPIRONOLACTONE 50 MG PO TABS: 75.0000 mg | ORAL_TABLET | Freq: Two times a day (BID) | ORAL | 3 refills | Status: DC

## 2017-12-11 ENCOUNTER — Inpatient Hospital Stay: Admission: RE | Admit: 2017-12-11 | Payer: Medicaid Other | Source: Ambulatory Visit

## 2017-12-15 ENCOUNTER — Telehealth: Payer: Self-pay | Admitting: Gastroenterology

## 2017-12-15 ENCOUNTER — Other Ambulatory Visit: Payer: Self-pay

## 2017-12-15 DIAGNOSIS — K7031 Alcoholic cirrhosis of liver with ascites: Secondary | ICD-10-CM

## 2017-12-15 NOTE — Telephone Encounter (Signed)
The order for paracentesis has been added to Epic.  The pt is aware she can set up appt at her convenience.

## 2017-12-18 ENCOUNTER — Telehealth: Payer: Self-pay | Admitting: Gastroenterology

## 2017-12-18 ENCOUNTER — Ambulatory Visit (HOSPITAL_COMMUNITY)
Admission: RE | Admit: 2017-12-18 | Discharge: 2017-12-18 | Disposition: A | Discharge status: Home / Self Care | Payer: Medicaid Other | Source: Ambulatory Visit | Attending: Gastroenterology | Admitting: Gastroenterology

## 2017-12-18 ENCOUNTER — Encounter (HOSPITAL_COMMUNITY): Payer: Self-pay | Admitting: Physician Assistant

## 2017-12-18 DIAGNOSIS — K7460 Unspecified cirrhosis of liver: Secondary | ICD-10-CM | POA: Insufficient documentation

## 2017-12-18 DIAGNOSIS — K7031 Alcoholic cirrhosis of liver with ascites: Secondary | ICD-10-CM

## 2017-12-18 DIAGNOSIS — R180 Malignant ascites: Secondary | ICD-10-CM | POA: Insufficient documentation

## 2017-12-18 DIAGNOSIS — Z853 Personal history of malignant neoplasm of breast: Secondary | ICD-10-CM | POA: Diagnosis not present

## 2017-12-18 HISTORY — PX: IR PARACENTESIS: IMG2679

## 2017-12-18 MED ORDER — LIDOCAINE HCL (PF) 2 % IJ SOLN
INTRAMUSCULAR | Status: AC | PRN
  Administered 2017-12-18: 10 mL

## 2017-12-18 MED ORDER — LIDOCAINE HCL (PF) 2 % IJ SOLN
INTRAMUSCULAR | Status: AC
  Filled 2017-12-18: qty 20

## 2017-12-18 NOTE — Procedures (Signed)
PROCEDURE SUMMARY:  Successful US guided paracentesis from left lateral abdomen.  Yielded 2.8 liters of clear yellow fluid.  No immediate complications.  Patient tolerated well.   Daeshawn Redmann S Seanpatrick Maisano PA-C 12/18/2017 2:03 PM

## 2017-12-22 ENCOUNTER — Telehealth: Payer: Self-pay | Admitting: Gastroenterology

## 2017-12-22 ENCOUNTER — Other Ambulatory Visit: Payer: Self-pay

## 2017-12-22 DIAGNOSIS — K7031 Alcoholic cirrhosis of liver with ascites: Secondary | ICD-10-CM

## 2017-12-22 NOTE — Telephone Encounter (Signed)
The pt has been advised that the order for paracentesis was added to Epic on 9/9 and she can schedule next app.

## 2017-12-22 NOTE — Telephone Encounter (Signed)
Order was placed on 9/9, I walked thru the order with [place] and she was able to see the information.

## 2017-12-23 ENCOUNTER — Ambulatory Visit
Admission: RE | Admit: 2017-12-23 | Discharge: 2017-12-23 | Disposition: A | Discharge status: Home / Self Care | Payer: Medicaid Other | Source: Ambulatory Visit | Attending: Gastroenterology | Admitting: Gastroenterology

## 2017-12-23 ENCOUNTER — Encounter: Payer: Self-pay | Admitting: Radiology

## 2017-12-23 DIAGNOSIS — K7031 Alcoholic cirrhosis of liver with ascites: Secondary | ICD-10-CM

## 2017-12-23 HISTORY — PX: IR RADIOLOGIST EVAL & MGMT: IMG5224

## 2017-12-23 NOTE — Consult Note (Signed)
Chief Complaint: Recurrent refractory ascites  Referring Physician(s): Jacobs,Daniel P  History of Present Illness: Alicia [name] is a 62 y.o. female with past no history significant for cirrhosis, hypertension GERD and alcoholic with cirrhosis who presents today for evaluation for TIPS creation.  Patient is unaccompanied and serves as her own historian.  Patient has been undergoing biweekly large-volume paracenteses for the past 2 years with most recent paracentesis yielding 2.8 L, 5.1 L and a 4.3 L on 12/18/2017, 12/04/2017 and 11/06/2017 respectively.  The patient has never experienced a hemodynamically significant upper or lower GI bleed.  She denies altered mental status.  No yellowing of the skin or eyes.  No change in appetite or energy level.  From a functional standpoint, the patient lives with her adult son and is independent in all activities of daily living.  The patient has been abstinent for alcohol for the past year.  Patient has a history of noncompliance and is hesitant to undergo any intervention.    Past Medical History:  Diagnosis Date  . Ascites   . Breast cancer ([place])   . Cancer ([place])   . Chronic anticoagulation   . Cirrhosis ([place])   . Dyspnea    with fluid buildup   . GERD (gastroesophageal reflux disease)   . Hypertension   . Hypotension     Past Surgical History:  Procedure Laterality Date  . BREAST LUMPECTOMY    . BREAST SURGERY    . CESAREAN SECTION    . ESOPHAGOGASTRODUODENOSCOPY (EGD) WITH PROPOFOL N/A 11/21/2016   Procedure: ESOPHAGOGASTRODUODENOSCOPY;  Surgeon: Milus Banister, MD;  Location: [facility];  Service: Endoscopy;  Laterality: N/A;  . IR PARACENTESIS  09/04/2016  . IR PARACENTESIS  09/20/2016  . IR PARACENTESIS  10/22/2016  . IR PARACENTESIS  01/27/2017  . IR PARACENTESIS  04/11/2017  . IR PARACENTESIS  06/05/2017  . IR PARACENTESIS  06/05/2017  . IR PARACENTESIS  06/19/2017  . IR PARACENTESIS  07/03/2017  . IR PARACENTESIS   07/17/2017  . IR PARACENTESIS  08/01/2017  . IR PARACENTESIS  08/14/2017  . IR PARACENTESIS  08/28/2017  . IR PARACENTESIS  09/11/2017  . IR PARACENTESIS  09/25/2017  . IR PARACENTESIS  10/08/2017  . IR PARACENTESIS  10/23/2017  . IR PARACENTESIS  11/06/2017  . IR PARACENTESIS  11/20/2017  . IR PARACENTESIS  12/04/2017  . IR PARACENTESIS  12/18/2017  . IR RADIOLOGIST EVAL & MGMT  12/23/2017  . LYMPH NODE DISSECTION      Allergies: Compazine [prochlorperazine edisylate] and Meclizine  Medications: Prior to Admission medications   Medication Sig Start Date End Date Taking? Authorizing Provider  amLODipine (NORVASC) 5 MG tablet Take 1 tablet (5 mg total) by mouth daily. 11/08/17 02/06/18 Yes Patriciaann Clan, DO  diphenhydrAMINE (BENADRYL) 25 MG tablet Take 25 mg by mouth every 6 (six) hours as needed for itching.   Yes [provider]  furosemide (LASIX) 40 MG tablet Take 1.5 tablets (60 mg total) by mouth daily. 12/09/17 01/08/18 Yes Milus Banister, MD  hydrALAZINE (APRESOLINE) 25 MG tablet Take 1 tablet (25 mg total) by mouth 3 (three) times daily. 11/08/17 02/06/18 Yes Darrelyn Hillock N, DO  metoprolol tartrate (LOPRESSOR) 25 MG tablet Take 1 tablet (25 mg total) by mouth 2 (two) times daily. 11/08/17 12/23/17 Yes Beard, Ralph Leyden, DO  omeprazole (PRILOSEC) 40 MG capsule Take 1 capsule (40 mg total) by mouth daily. Patient taking differently: Take 40 mg by mouth daily as needed (  Heart burn).  08/08/16  Yes Barton Dubois, MD  oxyCODONE (OXY IR/ROXICODONE) 5 MG immediate release tablet Take 1 tablet (5 mg total) by mouth every 6 (six) hours as needed for up to 5 doses for moderate pain. 11/08/17  Yes Rory Percy, DO  spironolactone (ALDACTONE) 50 MG tablet Take 1.5 tablets (75 mg total) by mouth 2 (two) times daily. 12/09/17 01/08/18 Yes Milus Banister, MD  magnesium oxide (MAG-OX) 400 MG tablet Take 400 mg by mouth as needed.     [provider]     Family History  Problem Relation Age of  Onset  . Diabetes Mother   . Diabetes Father   . Diabetes Brother   . Colon cancer Neg Hx   . Stomach cancer Neg Hx   . Esophageal cancer Neg Hx     Social History   Socioeconomic History  . Marital status: Single    Spouse name: Not on file  . Number of children: Not on file  . Years of education: Not on file  . Highest education level: Not on file  Occupational History  . Not on file  Social Needs  . Financial resource strain: Not on file  . Food insecurity:    Worry: Not on file    Inability: Not on file  . Transportation needs:    Medical: Not on file    Non-medical: Not on file  Tobacco Use  . Smoking status: Never Smoker  . Smokeless tobacco: Never Used  Substance and Sexual Activity  . Alcohol use: No    Comment: occassional wine   . Drug use: No  . Sexual activity: Not on file  Lifestyle  . Physical activity:    Days per week: Not on file    Minutes per session: Not on file  . Stress: Not on file  Relationships  . Social connections:    Talks on phone: Not on file    Gets together: Not on file    Attends religious service: Not on file    Active member of club or organization: Not on file    Attends meetings of clubs or organizations: Not on file    Relationship status: Not on file  Other Topics Concern  . Not on file  Social History Narrative  . Not on file    ECOG Status: 1 - Symptomatic but completely ambulatory  Review of Systems: A 12 point ROS discussed and pertinent positives are indicated in the HPI above.  All other systems are negative.  Review of Systems  Constitutional: Negative for activity change, appetite change, fatigue and fever.  Respiratory: Negative.   Cardiovascular: Negative.   Gastrointestinal: Positive for abdominal distention.  Skin: Negative for color change.  Psychiatric/Behavioral: Negative for behavioral problems and confusion.    Vital Signs: BP 134/84   Pulse 78   Temp 98.3 F (36.8 C) (Oral)   Resp 15    Ht 5' (1.524 m)   Wt 77.1 kg   SpO2 99%   BMI 33.20 kg/m   Physical Exam  Constitutional: She appears well-developed and well-nourished.  HENT:  Head: Normocephalic.  Eyes: Conjunctivae are normal.  Skin:  No definitive jaundice.  Psychiatric: She has a normal mood and affect. Her behavior is normal.  Nursing note and vitals reviewed.   Imaging: Ir Radiologist Eval & Mgmt  Result Date: 12/23/2017 Please refer to notes tab for details about interventional procedure. (Op Note)  Ir Paracentesis  Result Date: 12/18/2017 INDICATION: History of  breast cancer, cirrhosis, recurrent ascites. Request for therapeutic paracentesis up to 5 liters. EXAM: ULTRASOUND GUIDED PARACENTESIS MEDICATIONS: 2% Lidocaine 10 mL COMPLICATIONS: None immediate. PROCEDURE: Informed written consent was obtained from the patient after a discussion of the risks, benefits and alternatives to treatment. A timeout was performed prior to the initiation of the procedure. Initial ultrasound scanning demonstrates a moderate amount of ascites within the left lower abdominal quadrant. The left lower abdomen was prepped and draped in the usual sterile fashion. 1% lidocaine with epinephrine was used for local anesthesia. Following this, a 19 gauge, 7-cm, Yueh catheter was introduced. An ultrasound image was saved for documentation purposes. The paracentesis was performed. The catheter was removed and a dressing was applied. The patient tolerated the procedure well without immediate post procedural complication. FINDINGS: A total of approximately 2.8 liters of clear yellow fluid was removed. IMPRESSION: Successful ultrasound-guided paracentesis yielding 2.8 liters of peritoneal fluid. Read by: Gareth Eagle, PA-C Electronically Signed   By: Corrie Mckusick D.O.   On: 12/18/2017 14:03   Ir Paracentesis  Result Date: 12/04/2017 INDICATION: Patient with prior history of breast cancer, cirrhosis, recurrent ascites. Request made for  therapeutic paracentesis up to 7 liters. EXAM: ULTRASOUND GUIDED THERAPEUTIC PARACENTESIS MEDICATIONS: None COMPLICATIONS: None immediate. PROCEDURE: Informed written consent was obtained from the patient after a discussion of the risks, benefits and alternatives to treatment. A timeout was performed prior to the initiation of the procedure. Initial ultrasound scanning demonstrates a large amount of ascites within the left mid to lower abdominal quadrant. The left mid to lower abdomen was prepped and draped in the usual sterile fashion. 2% lidocaine was used for local anesthesia. Following this, a 19 gauge, 10-cm, Yueh catheter was introduced. An ultrasound image was saved for documentation purposes. The paracentesis was performed. The catheter was removed and a dressing was applied. The patient tolerated the procedure well without immediate post procedural complication. FINDINGS: A total of approximately 5.1 liters of yellow fluid was removed. IMPRESSION: Successful ultrasound-guided therapeutic paracentesis yielding 5.1 liters of peritoneal fluid. Read by: Rowe Robert, PA-C Electronically Signed   By: Sandi Mariscal M.D.   On: 12/04/2017 14:16    Labs:  CBC: Recent Labs    11/07/17 0621 11/07/17 1304 11/08/17 0536 11/28/17 1421  WBC 7.2 6.7 5.9 6.1  HGB 9.3* 8.7* 9.0* 10.5*  HCT 31.2* 28.7* 30.3* 32.9*  PLT 115* 104* 96* 182.0    COAGS: Recent Labs    01/31/17 1101 06/04/17 0149 11/06/17 1731  INR 1.0 1.00 1.14    BMP: Recent Labs    06/04/17 0149 11/06/17 1731 11/07/17 0621 11/08/17 0536 11/28/17 1421  NA 134* 141 141 140 138  K 5.3* 3.6 3.5 3.8 4.1  CL 106 112* 111 111 108  CO2 20* 19* 23 22 22   GLUCOSE 109* 124* 103* 96 103*  BUN 42* 24* 21* 24* 28*  CALCIUM 8.8* 7.9* 8.0* 7.8* 8.7  CREATININE 1.42* 1.65* 1.48* 1.73* 1.49*  GFRNONAA 39* 33* 37* 31*  --   GFRAA 45* 38* 43* 36*  --     LIVER FUNCTION TESTS: Recent Labs    01/31/17 1056 06/04/17 0149 11/06/17 1731    BILITOT 0.4 0.7 0.7  AST 20 35 16  ALT 9 22 8   ALKPHOS 149* 161* 107  PROT 7.0 8.6* 6.6  ALBUMIN 3.6 3.6 3.6    TUMOR MARKERS: Recent Labs    11/28/17 1421  AFPTM 4.0    Assessment and Plan:  Alicia [name] is  a 62 y.o. female with past no history significant for cirrhosis, hypertension GERD and alcoholic with cirrhosis who presents today for evaluation for TIPS creation.   Patient has been undergoing biweekly large-volume paracenteses for the past 2 years.  Calculated sodium meld score based on recent laboratories = 13 Creatinine (8/23) - 1.49. Sodium (8/23) - 138. Bilirubin (8/1) - 0.7. INR (8/1) - 1.14   Patient recently had a contrast-enhanced abdominal CT for a body wall hematoma she experienced during recent large-volume paracentesis which demonstrated patency of the hepatic venous system was negative for the presence of a hepatoma (of note, a dedicated hepatic protocol CT was not obtained).  Note, AFP obtained on 11/28/2017 was not found to be elevated  Given above, I feel the patient is a good candidate for elective TIPS creation for her recurrent symptomatically ascites.    As such, the pathophysiology of cirrhosis as well as the procedural aspects of TIPS creation was discussed in detail with the patient.  Benefits and risks, including but not limited to, infection, bleeding, damage to adjacent structures, worsening hepatic and/or cardiac function was discussed in detail.  I explained to her that I would recommend performing in the next 1 to 2 months as I am concerned that with continued large volume paracenteses she is at risk for a worsening MELD score.  Following his prolonged and detailed conversation, it appears the patient is NOT interested in pursuing TIPS creation and most specifically is concerned about risk of encephalopathy following TIPS creation.  While I explained that she has not experienced any encephalopathy presently and if she were to experience  postprocedural confusion, it would likely be mild and could potentially be treated with medication, the patient is not interested in taking any additional medications and overall appears somewhat skeptical about any aggressive interventions.  As such, recommend continued scheduled large-volume paracenteses.    I explained to the patient that if she were to experience a change of heart, based on today's conversation and her skepticism for the procedure, I would want to see her for repeat consultation and I encouraged her to bring a family member to this appointment.  Thank you for this interesting consult.  I greatly enjoyed meeting Alicia [name] and look forward to participating in their care.  A copy of this report was sent to the requesting provider on this date.  Electronically Signed: Sandi Mariscal 12/23/2017, 8:53 AM   I spent a total of 30 Minutes in face to face in clinical consultation, greater than 50% of which was counseling/coordinating care for TIPS evaluation

## 2017-12-30 IMAGING — US US PARACENTESIS
1 series · 10 of 10 positions shown · non-contrast
Comparison: none

INDICATION: Cirrhosis, prior history of breast cancer, recurrent ascites.
Request made for therapeutic paracentesis up to 7 liters.

[Series 1: us paracentesis · 0.30mm/px · 10 of 10 slices shown]
[im 1/10]
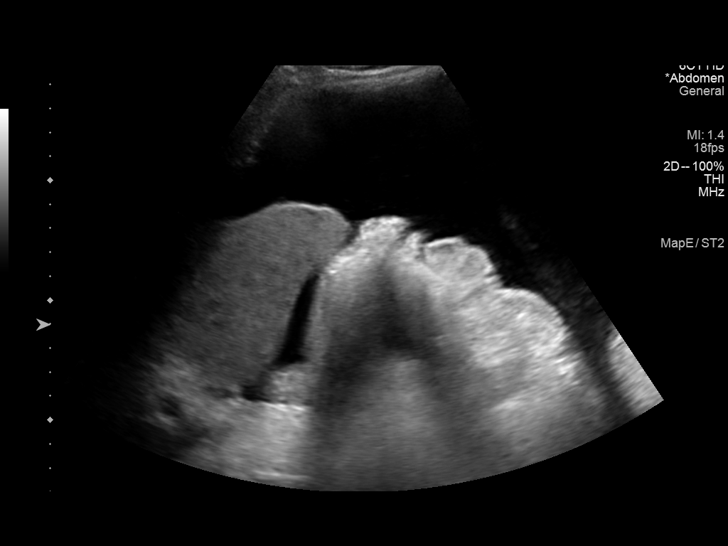
[im 2/10]
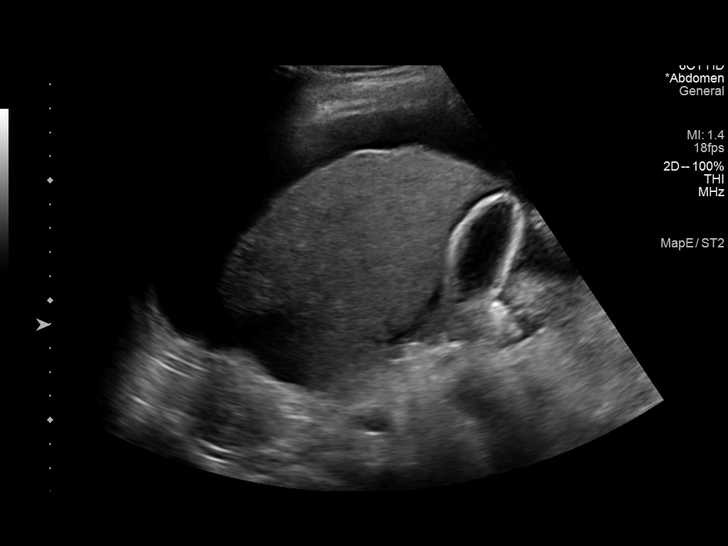
[im 3/10]
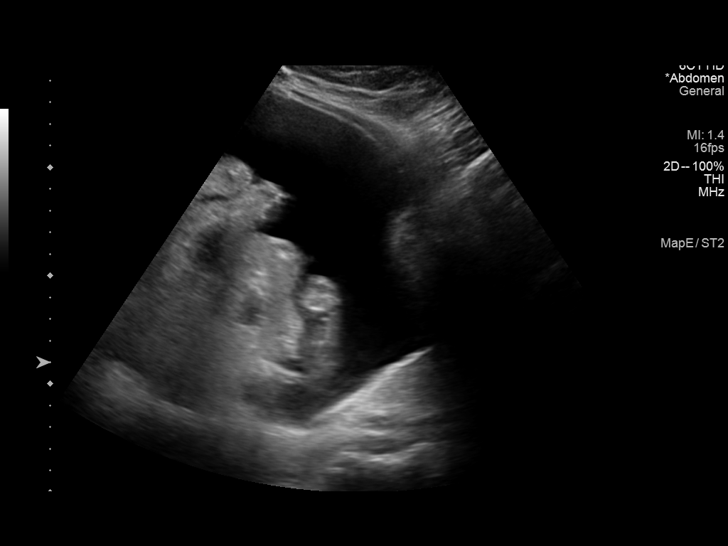
[im 4/10]
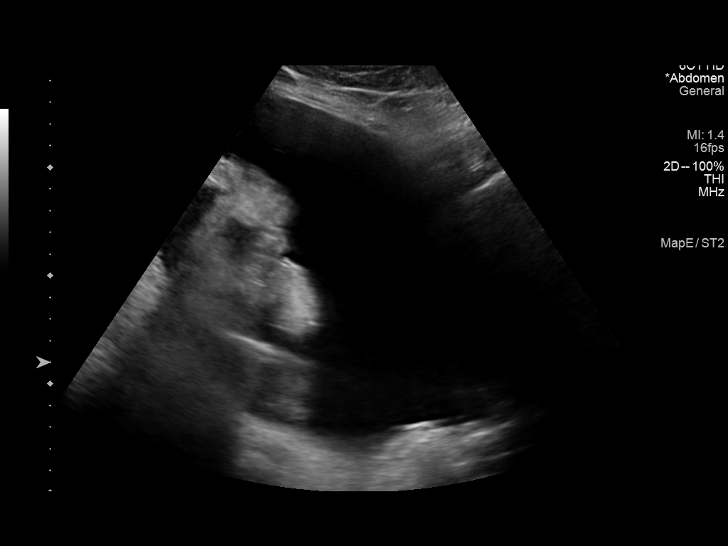
[im 5/10]
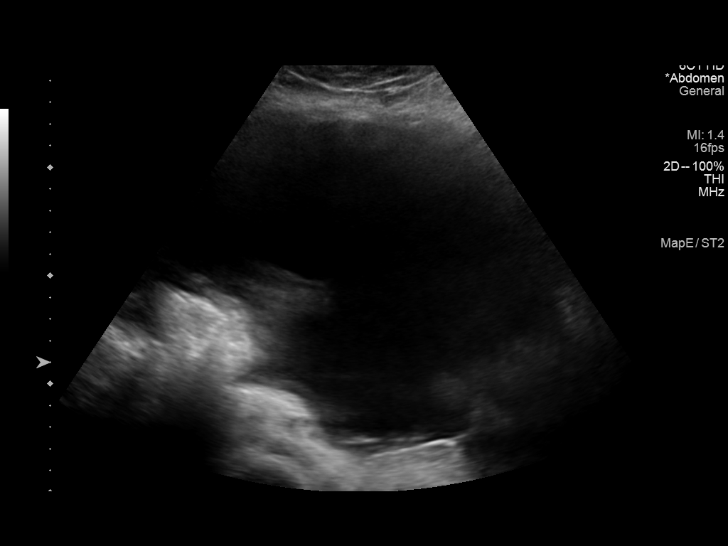
[im 6/10]
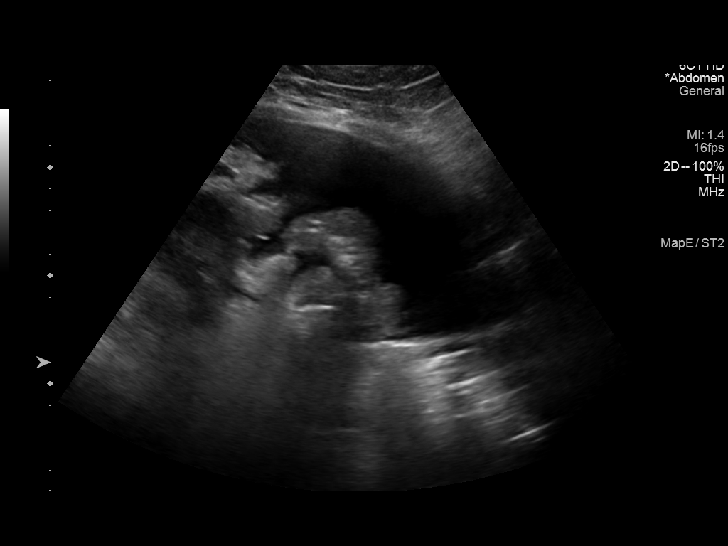
[im 7/10]
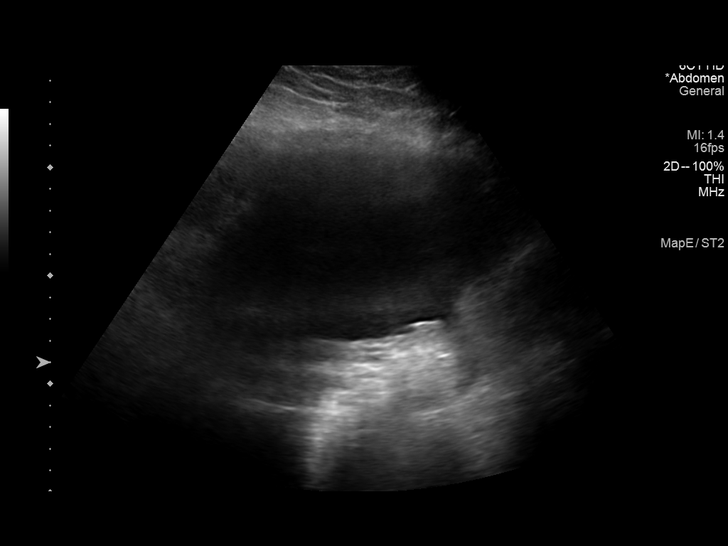
[im 8/10]
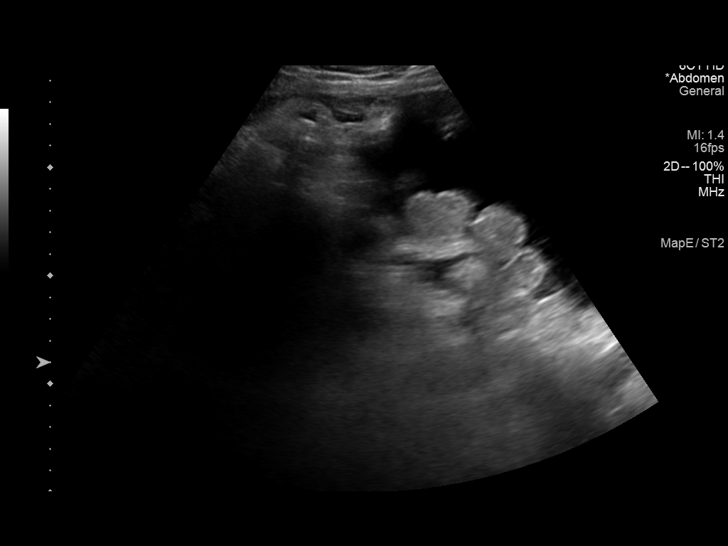
[im 9/10]
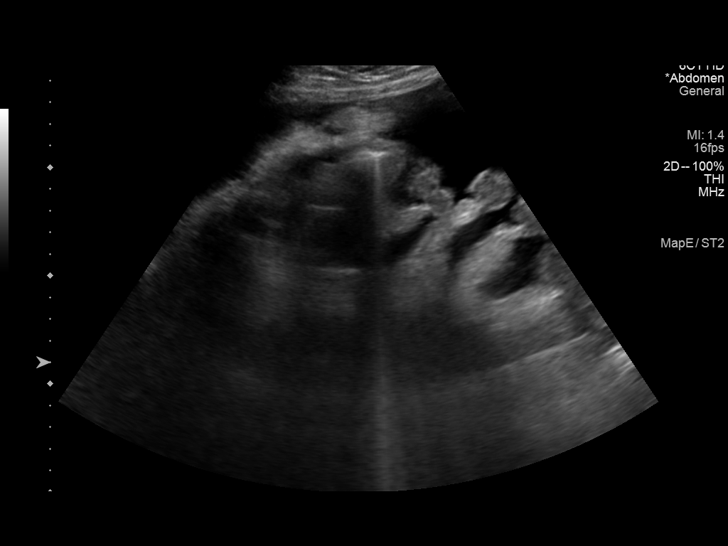
[im 10/10]
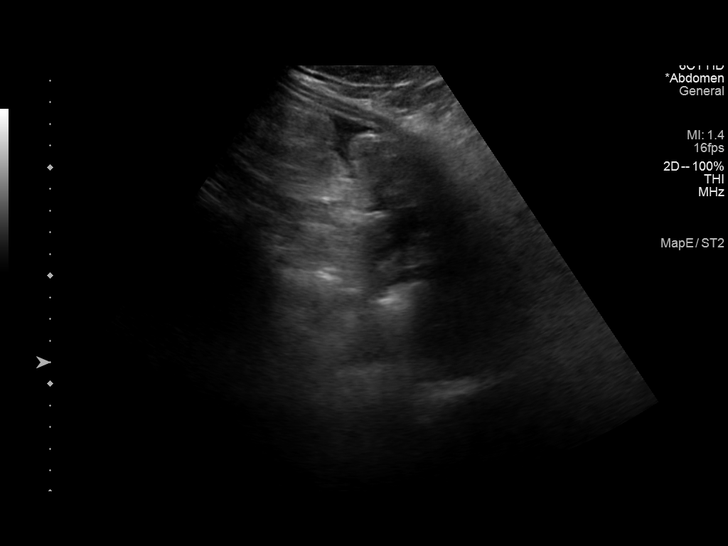

[10 of 10 positions shown; findings below may reference images not displayed]

EXAM:
ULTRASOUND GUIDED THERAPEUTIC PARACENTESIS

MEDICATIONS:
None.

COMPLICATIONS:
None immediate.

PROCEDURE:
Informed written consent was obtained from the patient after a
discussion of the risks, benefits and alternatives to treatment. A
timeout was performed prior to the initiation of the procedure.

Initial ultrasound scanning demonstrates a large amount of ascites
within the right lower abdominal quadrant. The right lower abdomen
was prepped and draped in the usual sterile fashion. 2% lidocaine
was used for local anesthesia.

Following this, a Yueh catheter was introduced. An ultrasound image
was saved for documentation purposes. The paracentesis was
performed. The catheter was removed and a dressing was applied. The
patient tolerated the procedure well without immediate post
procedural complication.
FINDINGS: A total of approximately 7 liters of yellow fluid was removed.
IMPRESSION: Successful ultrasound-guided therapeutic paracentesis yielding 7
liters of peritoneal fluid. The patient will receive 50 g IV albumin
postprocedure.

## 2018-01-01 ENCOUNTER — Other Ambulatory Visit: Payer: Self-pay | Admitting: Gastroenterology

## 2018-01-01 ENCOUNTER — Other Ambulatory Visit: Payer: Self-pay

## 2018-01-01 ENCOUNTER — Ambulatory Visit (HOSPITAL_COMMUNITY)
Admission: RE | Admit: 2018-01-01 | Discharge: 2018-01-01 | Disposition: A | Discharge status: Home / Self Care | Payer: Medicaid Other | Source: Ambulatory Visit | Attending: Gastroenterology | Admitting: Gastroenterology

## 2018-01-01 ENCOUNTER — Other Ambulatory Visit (HOSPITAL_COMMUNITY): Payer: Self-pay

## 2018-01-01 DIAGNOSIS — K7031 Alcoholic cirrhosis of liver with ascites: Secondary | ICD-10-CM

## 2018-01-01 DIAGNOSIS — Z8719 Personal history of other diseases of the digestive system: Secondary | ICD-10-CM | POA: Diagnosis not present

## 2018-01-01 DIAGNOSIS — R188 Other ascites: Secondary | ICD-10-CM

## 2018-01-01 NOTE — Progress Notes (Signed)
Patient ID: Alicia [name], female   DOB: 03/11/1956, 62 y.o.   MRN: [id number]   Limited US Abd  Shows little to NO ascites  No Paracentesis performed

## 2018-01-13 IMAGING — US US PARACENTESIS
1 series · 5 of 5 positions shown · non-contrast
Comparison: none

INDICATION: Patient with cirrhosis, prior history of breast cancer, recurrent
ascites. Request made for therapeutic paracentesis up to 7 liters.

[Series 1: us paracentesis · 0.26mm/px · 5 of 5 slices shown]
[im 1/5]
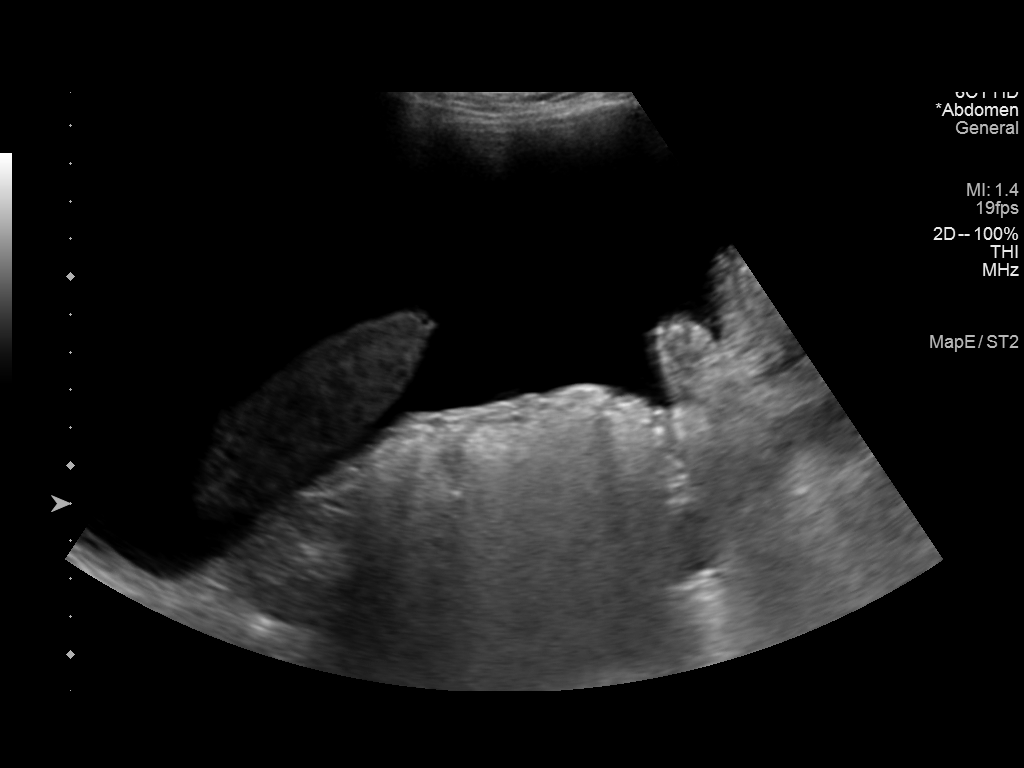
[im 2/5]
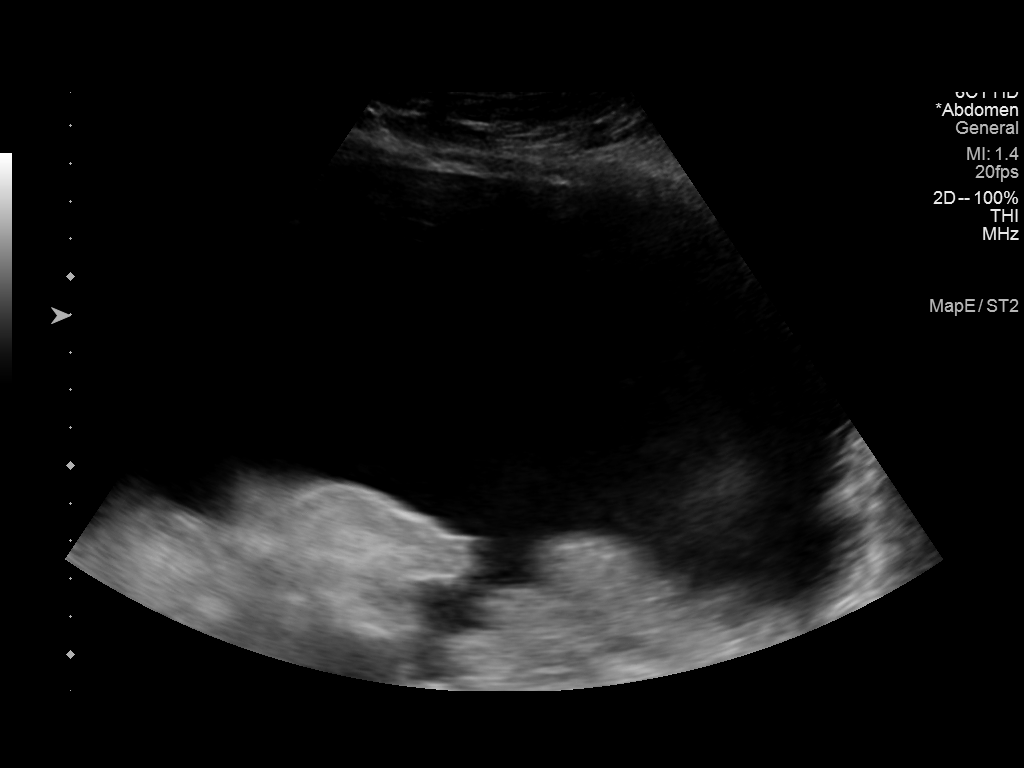
[im 3/5]
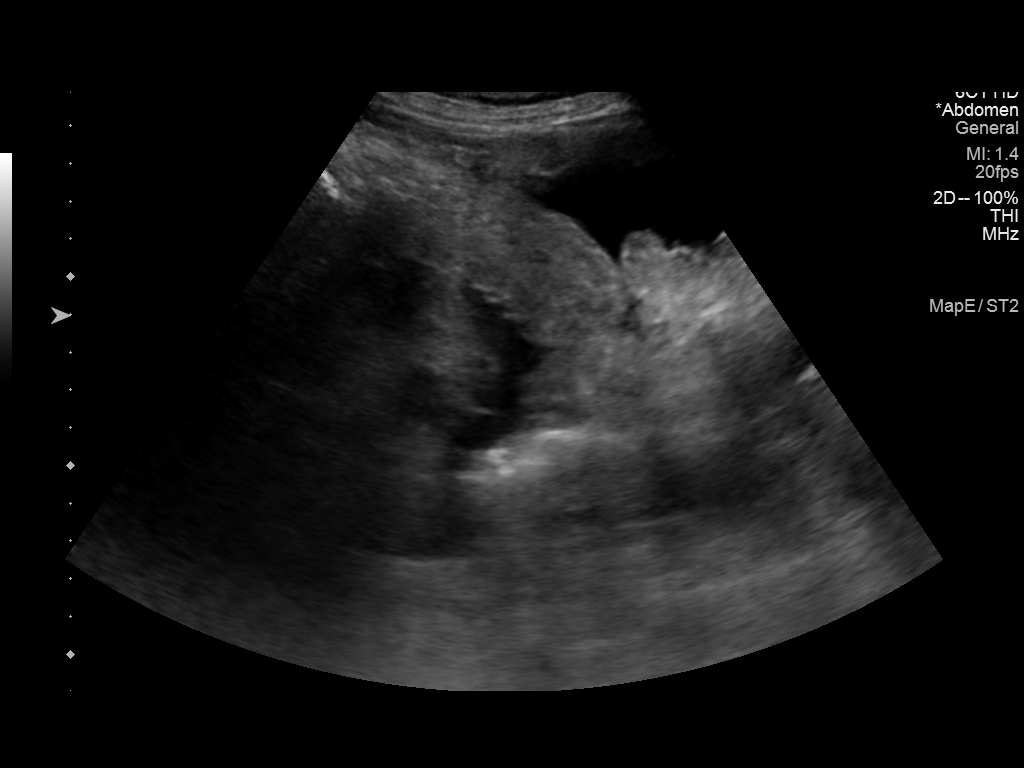
[im 4/5]
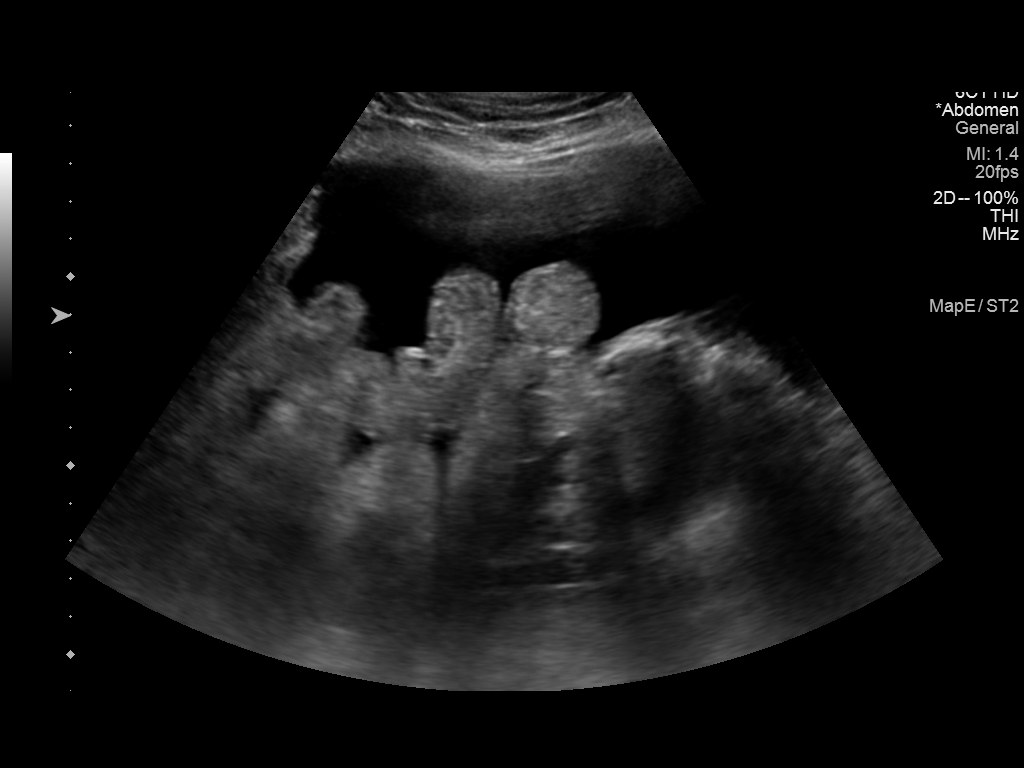
[im 5/5]
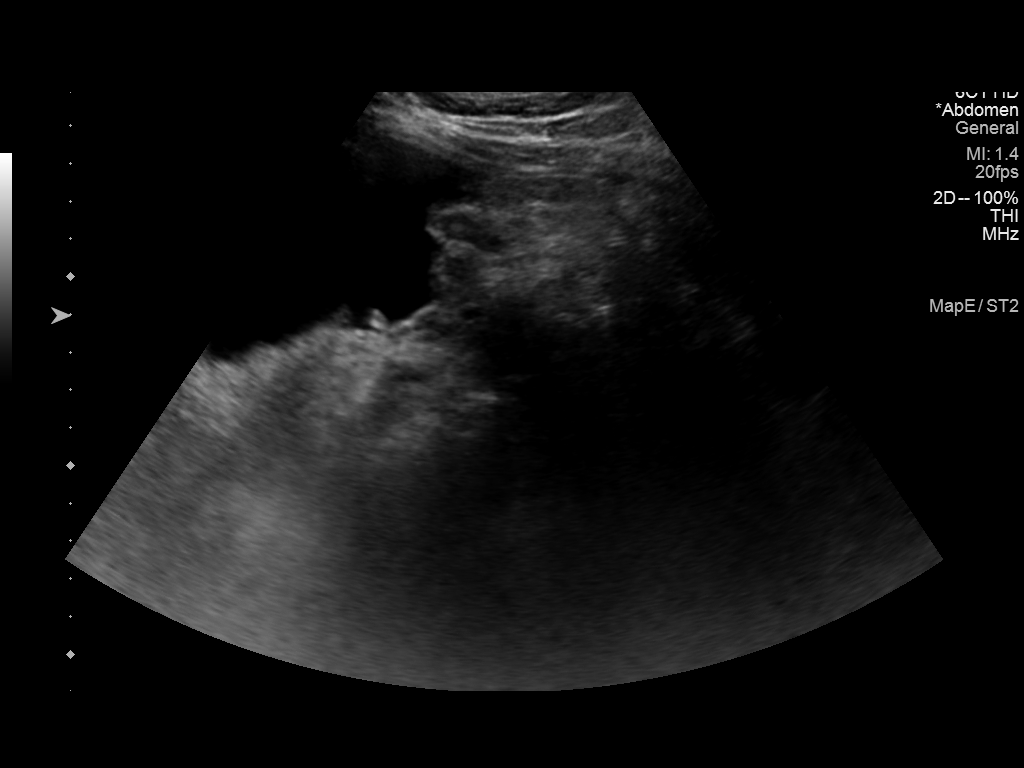

[5 of 5 positions shown; findings below may reference images not displayed]

EXAM:
ULTRASOUND GUIDED THERAPEUTIC PARACENTESIS

MEDICATIONS:
None.

COMPLICATIONS:
None immediate.

PROCEDURE:
Informed written consent was obtained from the patient after a
discussion of the risks, benefits and alternatives to treatment. A
timeout was performed prior to the initiation of the procedure.

Initial ultrasound scanning demonstrates a large amount of ascites
within the right mid to lower abdominal quadrant. The right mid to
lower abdomen was prepped and draped in the usual sterile fashion.
1% lidocaine was used for local anesthesia.

Following this, a Yueh catheter was introduced. An ultrasound image
was saved for documentation purposes. The paracentesis was
performed. The catheter was removed and a dressing was applied. The
patient tolerated the procedure well without immediate post
procedural complication.
FINDINGS: A total of approximately 7 liters of yellow fluid was removed.
IMPRESSION: Successful ultrasound-guided therapeutic paracentesis yielding 7
liters of peritoneal fluid. The patient will receive IV albumin
infusion post procedure.

## 2018-01-19 ENCOUNTER — Ambulatory Visit (HOSPITAL_COMMUNITY)
Admission: RE | Admit: 2018-01-19 | Discharge: 2018-01-19 | Disposition: A | Discharge status: Home / Self Care | Payer: Self-pay | Source: Ambulatory Visit | Attending: Gastroenterology | Admitting: Gastroenterology

## 2018-01-19 ENCOUNTER — Encounter (HOSPITAL_COMMUNITY): Payer: Self-pay | Admitting: Physician Assistant

## 2018-01-19 DIAGNOSIS — K7460 Unspecified cirrhosis of liver: Secondary | ICD-10-CM | POA: Insufficient documentation

## 2018-01-19 DIAGNOSIS — R188 Other ascites: Secondary | ICD-10-CM | POA: Insufficient documentation

## 2018-01-19 HISTORY — PX: IR PARACENTESIS: IMG2679

## 2018-01-19 MED ORDER — LIDOCAINE HCL 1 % IJ SOLN
INTRAMUSCULAR | Status: AC
  Filled 2018-01-19: qty 20

## 2018-01-19 MED ORDER — LIDOCAINE HCL (PF) 1 % IJ SOLN
INTRAMUSCULAR | Status: DC | PRN
  Administered 2018-01-19: 10 mL

## 2018-01-19 MED ORDER — ALBUMIN HUMAN 25 % IV SOLN
50.0000 g | Freq: Once | INTRAVENOUS | Status: DC
  Filled 2018-01-19: qty 200

## 2018-01-19 NOTE — Procedures (Signed)
PROCEDURE SUMMARY:  Successful US guided paracentesis from right lateral abdomen.  Yielded 2 L of clear yellow fluid.  No immediate complications.  Patient tolerated well.   Troy Hartzog S Giulianna Rocha PA-C 01/19/2018 3:08 PM

## 2018-01-20 ENCOUNTER — Telehealth: Payer: Self-pay | Admitting: Gastroenterology

## 2018-01-20 ENCOUNTER — Other Ambulatory Visit: Payer: Self-pay

## 2018-01-20 ENCOUNTER — Other Ambulatory Visit: Payer: Self-pay | Admitting: Gastroenterology

## 2018-01-20 DIAGNOSIS — K7031 Alcoholic cirrhosis of liver with ascites: Secondary | ICD-10-CM

## 2018-01-20 NOTE — Telephone Encounter (Signed)
Orders have been placed in Epic for IR para with albumin 50g per protocol.  Pt aware

## 2018-01-28 ENCOUNTER — Ambulatory Visit: Payer: Medicaid Other | Admitting: Gastroenterology

## 2018-02-09 ENCOUNTER — Ambulatory Visit (HOSPITAL_COMMUNITY)
Admission: RE | Admit: 2018-02-09 | Discharge: 2018-02-09 | Disposition: A | Discharge status: Home / Self Care | Payer: Self-pay | Source: Ambulatory Visit | Attending: Gastroenterology | Admitting: Gastroenterology

## 2018-02-09 ENCOUNTER — Ambulatory Visit (HOSPITAL_COMMUNITY): Payer: Self-pay

## 2018-02-09 ENCOUNTER — Encounter (HOSPITAL_COMMUNITY): Payer: Self-pay | Admitting: Physician Assistant

## 2018-02-09 ENCOUNTER — Encounter (HOSPITAL_COMMUNITY): Payer: Self-pay

## 2018-02-09 DIAGNOSIS — Z853 Personal history of malignant neoplasm of breast: Secondary | ICD-10-CM | POA: Insufficient documentation

## 2018-02-09 DIAGNOSIS — R188 Other ascites: Secondary | ICD-10-CM | POA: Insufficient documentation

## 2018-02-09 DIAGNOSIS — K7031 Alcoholic cirrhosis of liver with ascites: Secondary | ICD-10-CM

## 2018-02-09 DIAGNOSIS — K7460 Unspecified cirrhosis of liver: Secondary | ICD-10-CM | POA: Insufficient documentation

## 2018-02-09 HISTORY — PX: IR PARACENTESIS: IMG2679

## 2018-02-09 MED ORDER — LIDOCAINE HCL 1 % IJ SOLN
INTRAMUSCULAR | Status: AC | PRN
  Administered 2018-02-09: 10 mL

## 2018-02-09 MED ORDER — LIDOCAINE HCL 1 % IJ SOLN
INTRAMUSCULAR | Status: AC
  Filled 2018-02-09: qty 20

## 2018-02-09 NOTE — Procedures (Signed)
PROCEDURE SUMMARY:  Successful US guided paracentesis from left lateral abdomen.  Yielded 1.8 L of clear yellow fluid.  No immediate complications.  Patient tolerated well.    WENDY S BLAIR PA-C 02/09/2018 3:07 PM

## 2018-02-12 ENCOUNTER — Other Ambulatory Visit: Payer: Self-pay | Admitting: Gastroenterology

## 2018-02-12 DIAGNOSIS — K7031 Alcoholic cirrhosis of liver with ascites: Secondary | ICD-10-CM

## 2018-02-24 ENCOUNTER — Encounter (HOSPITAL_COMMUNITY): Payer: Self-pay | Admitting: Radiology

## 2018-02-24 ENCOUNTER — Ambulatory Visit (HOSPITAL_COMMUNITY)
Admission: RE | Admit: 2018-02-24 | Discharge: 2018-02-24 | Disposition: A | Discharge status: Home / Self Care | Payer: Self-pay | Source: Ambulatory Visit | Attending: Gastroenterology | Admitting: Gastroenterology

## 2018-02-24 DIAGNOSIS — K7031 Alcoholic cirrhosis of liver with ascites: Secondary | ICD-10-CM | POA: Insufficient documentation

## 2018-02-24 HISTORY — PX: IR PARACENTESIS: IMG2679

## 2018-02-24 MED ORDER — ALBUMIN HUMAN 25 % IV SOLN
50.0000 g | INTRAVENOUS | Status: DC
  Administered 2018-02-24: 50 g via INTRAVENOUS
  Filled 2018-02-24: qty 200

## 2018-02-24 MED ORDER — LIDOCAINE HCL 1 % IJ SOLN
INTRAMUSCULAR | Status: DC | PRN
  Administered 2018-02-24: 10 mL

## 2018-02-24 MED ORDER — ALBUMIN HUMAN 25 % IV SOLN
INTRAVENOUS | Status: AC
  Filled 2018-02-24: qty 200

## 2018-02-24 MED ORDER — LIDOCAINE HCL 1 % IJ SOLN
INTRAMUSCULAR | Status: AC
  Filled 2018-02-24: qty 20

## 2018-02-24 NOTE — Procedures (Signed)
PROCEDURE SUMMARY:  Successful US guided paracentesis from LLQ.  Yielded 2.6 L of clear yellow fluid.  No immediate complications.  Pt tolerated well.   Specimen was not sent for labs.  Ascencion Dike PA-C 02/24/2018 11:19 AM

## 2018-02-25 ENCOUNTER — Telehealth: Payer: Self-pay | Admitting: Gastroenterology

## 2018-02-25 NOTE — Telephone Encounter (Signed)
Left message on machine to call back  

## 2018-02-26 NOTE — Telephone Encounter (Signed)
Line rings busy  

## 2018-02-27 NOTE — Telephone Encounter (Signed)
Line continues to ring busy.  The pt will need to contact PCP for handicap sticker and orders are in for paracentesis.

## 2018-03-02 ENCOUNTER — Ambulatory Visit (HOSPITAL_COMMUNITY): Payer: Self-pay

## 2018-03-12 ENCOUNTER — Ambulatory Visit (HOSPITAL_COMMUNITY): Payer: Self-pay

## 2018-03-16 ENCOUNTER — Encounter (HOSPITAL_COMMUNITY): Payer: Self-pay | Admitting: Physician Assistant

## 2018-03-16 ENCOUNTER — Ambulatory Visit (HOSPITAL_COMMUNITY)
Admission: RE | Admit: 2018-03-16 | Discharge: 2018-03-16 | Disposition: A | Discharge status: Home / Self Care | Payer: Self-pay | Source: Ambulatory Visit | Attending: Gastroenterology | Admitting: Gastroenterology

## 2018-03-16 DIAGNOSIS — K7031 Alcoholic cirrhosis of liver with ascites: Secondary | ICD-10-CM

## 2018-03-16 HISTORY — PX: IR PARACENTESIS: IMG2679

## 2018-03-16 MED ORDER — LIDOCAINE HCL 1 % IJ SOLN
INTRAMUSCULAR | Status: DC | PRN
  Administered 2018-03-16: 20 mL

## 2018-03-16 MED ORDER — LIDOCAINE HCL 1 % IJ SOLN
INTRAMUSCULAR | Status: AC
  Filled 2018-03-16: qty 20

## 2018-03-16 NOTE — Procedures (Signed)
PROCEDURE SUMMARY:  Successful US guided paracentesis from left lateral abdomen.  Yielded 3 L of clear yellow fluid.  No immediate complications.  Patient tolerated well.  EBL trace  WENDY S BLAIR PA-C 03/16/2018 12:38 PM

## 2018-03-31 ENCOUNTER — Ambulatory Visit (HOSPITAL_COMMUNITY): Payer: Self-pay

## 2018-04-09 ENCOUNTER — Ambulatory Visit (HOSPITAL_COMMUNITY): Payer: Self-pay

## 2018-04-10 ENCOUNTER — Encounter (HOSPITAL_COMMUNITY): Payer: Self-pay | Admitting: Student

## 2018-04-10 ENCOUNTER — Ambulatory Visit (HOSPITAL_COMMUNITY): Payer: Self-pay

## 2018-04-10 ENCOUNTER — Ambulatory Visit (HOSPITAL_COMMUNITY)
Admission: RE | Admit: 2018-04-10 | Discharge: 2018-04-10 | Disposition: A | Discharge status: Home / Self Care | Payer: Self-pay | Source: Ambulatory Visit | Attending: Gastroenterology | Admitting: Gastroenterology

## 2018-04-10 DIAGNOSIS — K7031 Alcoholic cirrhosis of liver with ascites: Secondary | ICD-10-CM | POA: Insufficient documentation

## 2018-04-10 HISTORY — PX: IR PARACENTESIS: IMG2679

## 2018-04-10 MED ORDER — LIDOCAINE HCL 1 % IJ SOLN
INTRAMUSCULAR | Status: AC
  Filled 2018-04-10: qty 20

## 2018-04-10 MED ORDER — ALBUMIN HUMAN 25 % IV SOLN: 50.0000 g | Freq: Once | INTRAVENOUS | Status: DC

## 2018-04-10 MED ORDER — LIDOCAINE HCL 1 % IJ SOLN
INTRAMUSCULAR | Status: DC | PRN
  Administered 2018-04-10: 10 mL

## 2018-04-10 NOTE — Procedures (Signed)
PROCEDURE SUMMARY:  Successful image-guided paracentesis from the right lower abdomen.  Yielded 3.1 liters of clear yellow fluid.  No immediate complications.  Patient tolerated well.   Specimen was not sent for labs.  Claris Pong Kinzleigh Kandler PA-C 04/10/2018 11:17 AM

## 2018-04-30 ENCOUNTER — Telehealth: Payer: Self-pay | Admitting: Gastroenterology

## 2018-04-30 NOTE — Telephone Encounter (Signed)
The pt was advised she can call [facility] and get her appt scheduled.

## 2018-04-30 NOTE — Telephone Encounter (Signed)
Pt would like to sch an appt for Paracentesis.

## 2018-05-07 IMAGING — US IR PARACENTESIS
1 series · 1 of 1 positions shown · non-contrast
Comparison: none

INDICATION: Patient with recurrent ascites. Request is made for therapeutic
paracentesis of up to 7 L maximum. Patient to receive albumin post
procedure.

[Series 1: ir (id) (id)/(id)/(id) ir · 1 of 1 slices shown]
[im 1/1]
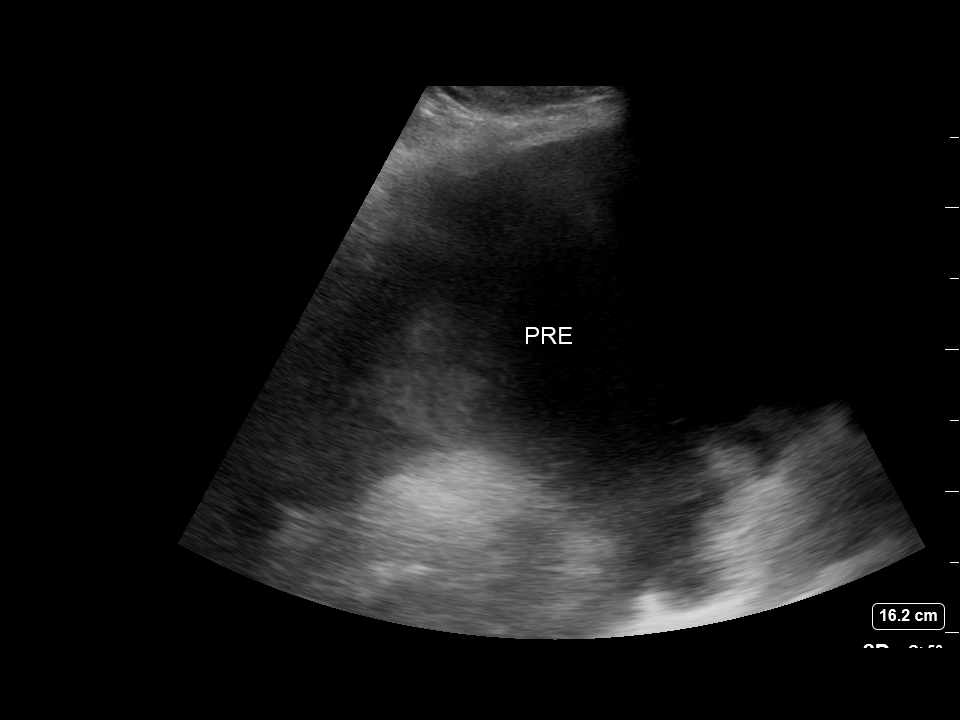

[1 of 1 positions shown; findings below may reference images not displayed]

EXAM:
ULTRASOUND GUIDED THERAPEUTIC PARACENTESIS

MEDICATIONS:
10 mL 2% lidocaine

COMPLICATIONS:
None immediate.

PROCEDURE:
Informed written consent was obtained from the patient after a
discussion of the risks, benefits and alternatives to treatment. A
timeout was performed prior to the initiation of the procedure.

Initial ultrasound scanning demonstrates a large amount of ascites
within the right lower abdominal quadrant. The right lower abdomen
was prepped and draped in the usual sterile fashion. 2% lidocaine
was used for local anesthesia.

Following this, a 19 gauge, 7-cm, Yueh catheter was introduced. An
ultrasound image was saved for documentation purposes. The
paracentesis was performed. The catheter was removed and a dressing
was applied. The patient tolerated the procedure well without
immediate post procedural complication.
FINDINGS: A total of approximately 5.0 liters of clear, yellow fluid was
removed. Samples were sent to the laboratory as requested by the
clinical team.
IMPRESSION: Successful ultrasound-guided therapeutic paracentesis yielding
liters of peritoneal fluid.

## 2018-05-08 ENCOUNTER — Encounter (HOSPITAL_COMMUNITY): Payer: Self-pay | Admitting: Radiology

## 2018-05-08 ENCOUNTER — Ambulatory Visit (HOSPITAL_COMMUNITY)
Admission: RE | Admit: 2018-05-08 | Discharge: 2018-05-08 | Disposition: A | Discharge status: Home / Self Care | Payer: Self-pay | Source: Ambulatory Visit | Attending: Gastroenterology | Admitting: Gastroenterology

## 2018-05-08 DIAGNOSIS — K7031 Alcoholic cirrhosis of liver with ascites: Secondary | ICD-10-CM

## 2018-05-08 HISTORY — PX: IR PARACENTESIS: IMG2679

## 2018-05-08 MED ORDER — LIDOCAINE HCL 1 % IJ SOLN
INTRAMUSCULAR | Status: AC
  Filled 2018-05-08: qty 20

## 2018-05-08 MED ORDER — LIDOCAINE HCL 1 % IJ SOLN
INTRAMUSCULAR | Status: DC | PRN
  Administered 2018-05-08: 10 mL

## 2018-05-08 NOTE — Procedures (Addendum)
   US guided RLQ paracentesis  3.8 L yellow fluid No labs  Tolerated well

## 2018-06-01 ENCOUNTER — Telehealth: Payer: Self-pay | Admitting: Gastroenterology

## 2018-06-01 ENCOUNTER — Other Ambulatory Visit: Payer: Self-pay

## 2018-06-01 DIAGNOSIS — K7031 Alcoholic cirrhosis of liver with ascites: Secondary | ICD-10-CM

## 2018-06-01 NOTE — Telephone Encounter (Signed)
Message left for the pt to call and set up follow up appt for further paracentesis orders.  She was due for follow up in October of 2019.  I also gave Vickii Chafe the message at IR to notify the pt to call our office for follow up visit.

## 2018-06-01 NOTE — Telephone Encounter (Signed)
Peggy from the scheduling department called stating that a new order for paracentesis needs to be sent. She said that they need an order for every paracentesis, otherwise the system does not allow them to schedule it. Pls call her once this is taken care of so she can call pt to schedule.

## 2018-06-01 NOTE — Telephone Encounter (Signed)
The order has been added for 1 paracentesis-- the pt needs to schedule a follow up.  She was due to be seen in October 2019.  Left message on machine to call back

## 2018-06-03 ENCOUNTER — Ambulatory Visit (HOSPITAL_COMMUNITY): Payer: Self-pay

## 2018-06-11 ENCOUNTER — Encounter (HOSPITAL_COMMUNITY): Payer: Self-pay | Admitting: Interventional Radiology

## 2018-06-11 ENCOUNTER — Ambulatory Visit (HOSPITAL_COMMUNITY)
Admission: RE | Admit: 2018-06-11 | Discharge: 2018-06-11 | Disposition: A | Discharge status: Home / Self Care | Payer: Self-pay | Source: Ambulatory Visit | Attending: Gastroenterology | Admitting: Gastroenterology

## 2018-06-11 DIAGNOSIS — K7031 Alcoholic cirrhosis of liver with ascites: Secondary | ICD-10-CM | POA: Insufficient documentation

## 2018-06-11 HISTORY — PX: IR PARACENTESIS: IMG2679

## 2018-06-11 MED ORDER — LIDOCAINE HCL 1 % IJ SOLN
INTRAMUSCULAR | Status: AC
  Filled 2018-06-11: qty 20

## 2018-06-29 ENCOUNTER — Telehealth: Payer: Self-pay | Admitting: Gastroenterology

## 2018-06-29 NOTE — Telephone Encounter (Signed)
Yes, she needs a LV paracentesis about once per month.  OK to schedule for now.  Tell IR it is urgent, she will undoubtedly end up in the ER instead.  Please put her on for ROV with me, my next available, explain it will be a 'virtual visit"  Thanks

## 2018-06-29 NOTE — Telephone Encounter (Signed)
Pt requested to schedule a paracentesis.  

## 2018-06-29 NOTE — Telephone Encounter (Signed)
Dr Ardis Hughs the pt has not bee seen since August 19 and was suppose to follow up in October.  Can she have paracentesis?

## 2018-06-29 NOTE — Telephone Encounter (Signed)
Do you want albumin or labs?

## 2018-07-07 ENCOUNTER — Ambulatory Visit (HOSPITAL_COMMUNITY)
Admission: RE | Admit: 2018-07-07 | Discharge: 2018-07-07 | Disposition: A | Discharge status: Home / Self Care | Payer: Self-pay | Source: Ambulatory Visit | Attending: Gastroenterology | Admitting: Gastroenterology

## 2018-07-07 ENCOUNTER — Other Ambulatory Visit: Payer: Self-pay

## 2018-07-07 ENCOUNTER — Encounter (HOSPITAL_COMMUNITY): Payer: Self-pay | Admitting: Student

## 2018-07-07 DIAGNOSIS — R188 Other ascites: Secondary | ICD-10-CM | POA: Insufficient documentation

## 2018-07-07 DIAGNOSIS — K7031 Alcoholic cirrhosis of liver with ascites: Secondary | ICD-10-CM

## 2018-07-07 HISTORY — PX: IR PARACENTESIS: IMG2679

## 2018-07-07 MED ORDER — LIDOCAINE HCL 1 % IJ SOLN
INTRAMUSCULAR | Status: AC
  Filled 2018-07-07: qty 20

## 2018-07-07 NOTE — Procedures (Signed)
PROCEDURE SUMMARY:  Successful US guided paracentesis from right lateral abdomen.  Yielded 2.0 liters of yellow fluid.  No immediate complications.  Pt tolerated well.   Specimen was not sent for labs.  EBL < 29mL  Docia Barrier PA-C 07/07/2018 11:10 AM

## 2018-09-01 ENCOUNTER — Telehealth: Payer: Self-pay | Admitting: Gastroenterology

## 2018-09-01 NOTE — Telephone Encounter (Signed)
Patient would like to be scheduled for a paracentesis at [facility].

## 2018-09-01 NOTE — Telephone Encounter (Signed)
Pt reports her belly has really "blown up." She states there is a lot of fluid present and she is requesting a paracentesis. Reports her last one was done 07/07/18. Please advise.

## 2018-09-01 NOTE — Telephone Encounter (Signed)
We cannot do standing orders anymore with epic for para. These are the last orders that were entered:  If therapeutic, is there a maximum amount of fluid to be removed? Yes   What is the maximum amount of fluide to be removed? 5 liters   Are labs required for specimen collection? No   Is Albumin medication needed? Yes   A seperate Albumin medication order is required. I understand I need to place a separate order for albumin   Reason for Exam (SYMPTOM OR DIAGNOSIS REQUIRED) ascites   Note:  Appropriate reason for exam on pre-op X-rays should indicate the reason for the surgery. Examples include: Coronary Artery disease, osteoartiritis of the knee, brain tumor, etc.  Preferred Imaging Location? [facility]        Do you want the same orders?

## 2018-09-01 NOTE — Telephone Encounter (Signed)
Got it, thanks  Yes, those orders look great.

## 2018-09-01 NOTE — Telephone Encounter (Signed)
She was getting paracentesis every 2 to 4 weeks for the past year or so.  I believe she should still have a standing order for it and if her last one was 7 or 8 weeks ago I am not surprised she is in need.  Certainly okay to go ahead and schedule, check to see if there is a standing order already thanks.

## 2018-09-02 ENCOUNTER — Other Ambulatory Visit: Payer: Self-pay

## 2018-09-02 DIAGNOSIS — K7031 Alcoholic cirrhosis of liver with ascites: Secondary | ICD-10-CM

## 2018-09-02 NOTE — Telephone Encounter (Signed)
Pt scheduled for IR para at [facility] 09/04/18@10am , pt to arrive there at 9:45am. Pt aware of appt. Orders as below.

## 2018-09-03 ENCOUNTER — Other Ambulatory Visit: Payer: Self-pay | Admitting: Surgery

## 2018-09-03 ENCOUNTER — Ambulatory Visit: Admission: RE | Admit: 2018-09-03 | Payer: Self-pay | Source: Ambulatory Visit

## 2018-09-03 DIAGNOSIS — K352 Acute appendicitis with generalized peritonitis, without abscess: Secondary | ICD-10-CM

## 2018-09-04 ENCOUNTER — Other Ambulatory Visit: Payer: Self-pay

## 2018-09-04 ENCOUNTER — Ambulatory Visit (HOSPITAL_COMMUNITY)
Admission: RE | Admit: 2018-09-04 | Discharge: 2018-09-04 | Disposition: A | Discharge status: Home / Self Care | Payer: Medicare Other | Source: Ambulatory Visit | Attending: Gastroenterology | Admitting: Gastroenterology

## 2018-09-04 ENCOUNTER — Encounter (HOSPITAL_COMMUNITY): Payer: Self-pay | Admitting: Physician Assistant

## 2018-09-04 DIAGNOSIS — K7031 Alcoholic cirrhosis of liver with ascites: Secondary | ICD-10-CM

## 2018-09-04 DIAGNOSIS — R188 Other ascites: Secondary | ICD-10-CM | POA: Diagnosis not present

## 2018-09-04 HISTORY — PX: IR PARACENTESIS: IMG2679

## 2018-09-04 MED ORDER — LIDOCAINE HCL 1 % IJ SOLN
INTRAMUSCULAR | Status: AC
  Filled 2018-09-04: qty 20

## 2018-09-04 NOTE — Procedures (Signed)
PROCEDURE SUMMARY:  Successful US guided paracentesis from right lateral abdomen.  Yielded 3.6 liters of clear yellow fluid.  No immediate complications.  Patient tolerated well.  EBL = trace  Martisha Toulouse S Trice Aspinall PA-C 09/04/2018 1:14 PM

## 2018-10-08 IMAGING — US IR PARACENTESIS
1 series · 2 of 2 positions shown · non-contrast
Comparison: none

INDICATION: History of breast cancer, cirrhosis, recurrent ascites. Request for
therapeutic paracentesis up to 5 liters.

[Series 1: ir (id) (id)/(id)/(id) ir · 2 of 2 slices shown]
[im 1/2]
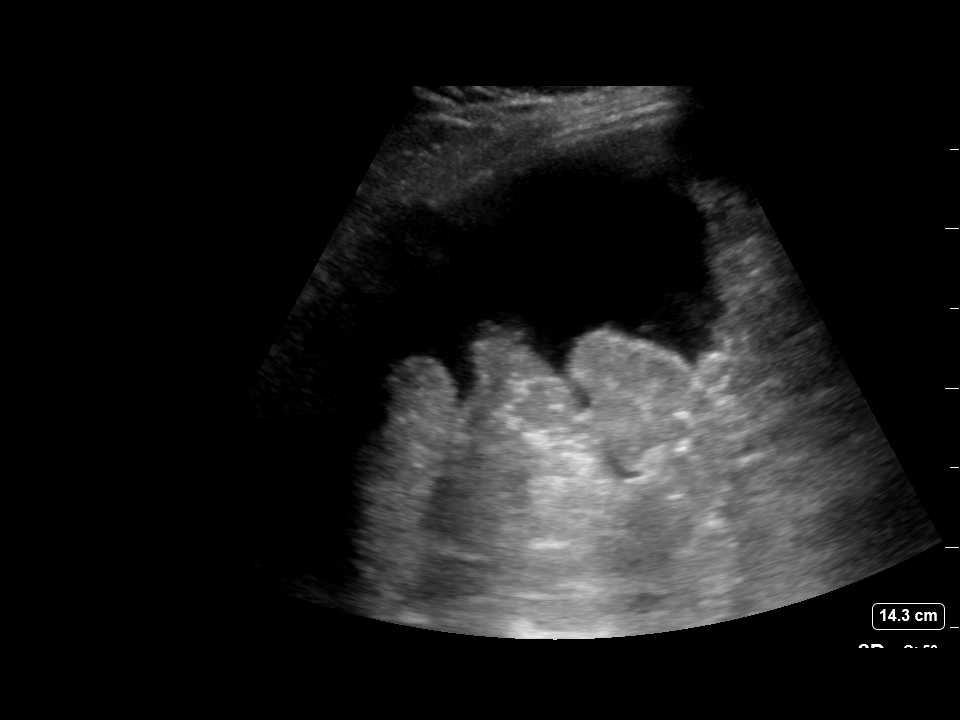
[im 2/2]
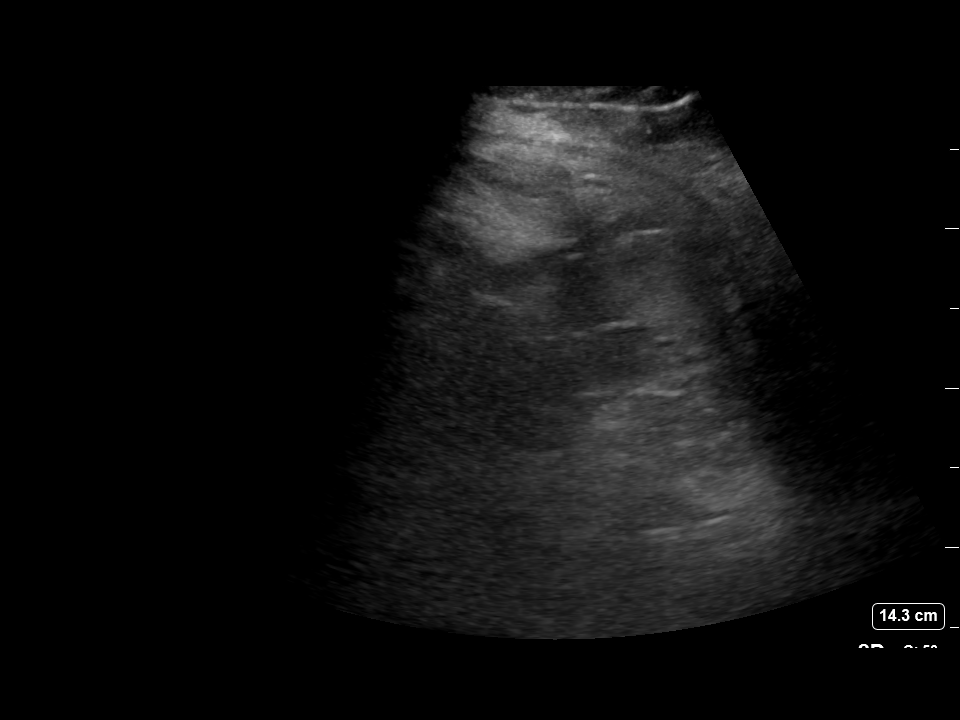

[2 of 2 positions shown; findings below may reference images not displayed]

EXAM:
ULTRASOUND GUIDED PARACENTESIS

MEDICATIONS:
2% Lidocaine 10 mL

COMPLICATIONS:
None immediate.

PROCEDURE:
Informed written consent was obtained from the patient after a
discussion of the risks, benefits and alternatives to treatment. A
timeout was performed prior to the initiation of the procedure.

Initial ultrasound scanning demonstrates a moderate amount of
ascites within the left lower abdominal quadrant. The left lower
abdomen was prepped and draped in the usual sterile fashion. 1%
lidocaine with epinephrine was used for local anesthesia.

Following this, a 19 gauge, 7-cm, Yueh catheter was introduced. An
ultrasound image was saved for documentation purposes. The
paracentesis was performed. The catheter was removed and a dressing
was applied. The patient tolerated the procedure well without
immediate post procedural complication.
FINDINGS: A total of approximately 2.8 liters of clear yellow fluid was
removed.
IMPRESSION: Successful ultrasound-guided paracentesis yielding 2.8 liters of
peritoneal fluid.

## 2018-10-19 ENCOUNTER — Telehealth: Payer: Self-pay | Admitting: Gastroenterology

## 2018-10-19 ENCOUNTER — Other Ambulatory Visit: Payer: Self-pay

## 2018-10-19 DIAGNOSIS — K7031 Alcoholic cirrhosis of liver with ascites: Secondary | ICD-10-CM

## 2018-10-19 NOTE — Telephone Encounter (Signed)
Left message on machine to call back  

## 2018-10-19 NOTE — Telephone Encounter (Signed)
The pt will call radiology and get paracentesis set up.  ROV also scheduled.

## 2018-10-22 ENCOUNTER — Other Ambulatory Visit: Payer: Self-pay

## 2018-10-22 ENCOUNTER — Encounter (HOSPITAL_COMMUNITY): Payer: Self-pay | Admitting: Student

## 2018-10-22 ENCOUNTER — Ambulatory Visit (HOSPITAL_COMMUNITY)
Admission: RE | Admit: 2018-10-22 | Discharge: 2018-10-22 | Disposition: A | Discharge status: Home / Self Care | Payer: Medicare Other | Source: Ambulatory Visit | Attending: Gastroenterology | Admitting: Gastroenterology

## 2018-10-22 DIAGNOSIS — K7031 Alcoholic cirrhosis of liver with ascites: Secondary | ICD-10-CM | POA: Diagnosis not present

## 2018-10-22 HISTORY — PX: IR PARACENTESIS: IMG2679

## 2018-10-22 IMAGING — US IR ABDOMEN US LIMITED
1 series · 4 of 4 positions shown · non-contrast
Comparison: Multiple previous ultrasound-guided paracenteses, most
recently on 12/18/2017.

CLINICAL DATA: Evaluate for abdominal ascites and perform
ultrasound-guided paracentesis as indicated

EXAM:
LIMITED ABDOMEN ULTRASOUND FOR ASCITES
TECHNIQUE: Limited ultrasound survey for ascites was performed in all four
abdominal quadrants.

[Series 1: ir (id) (id)/(id)/(id) ir · 4 of 4 slices shown]
[im 1/4]
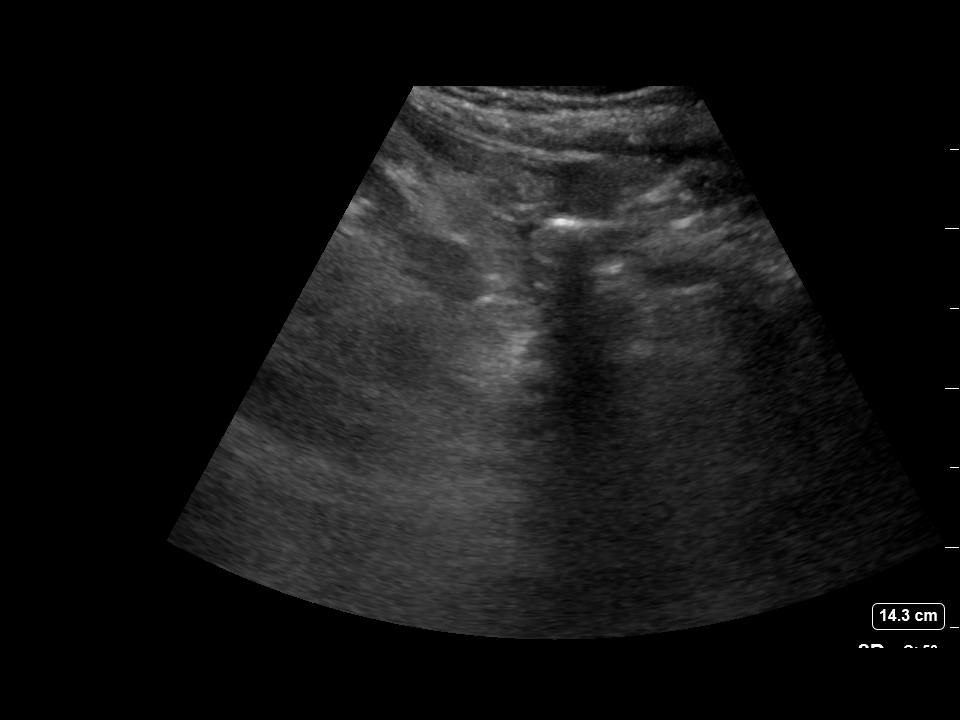
[im 2/4]
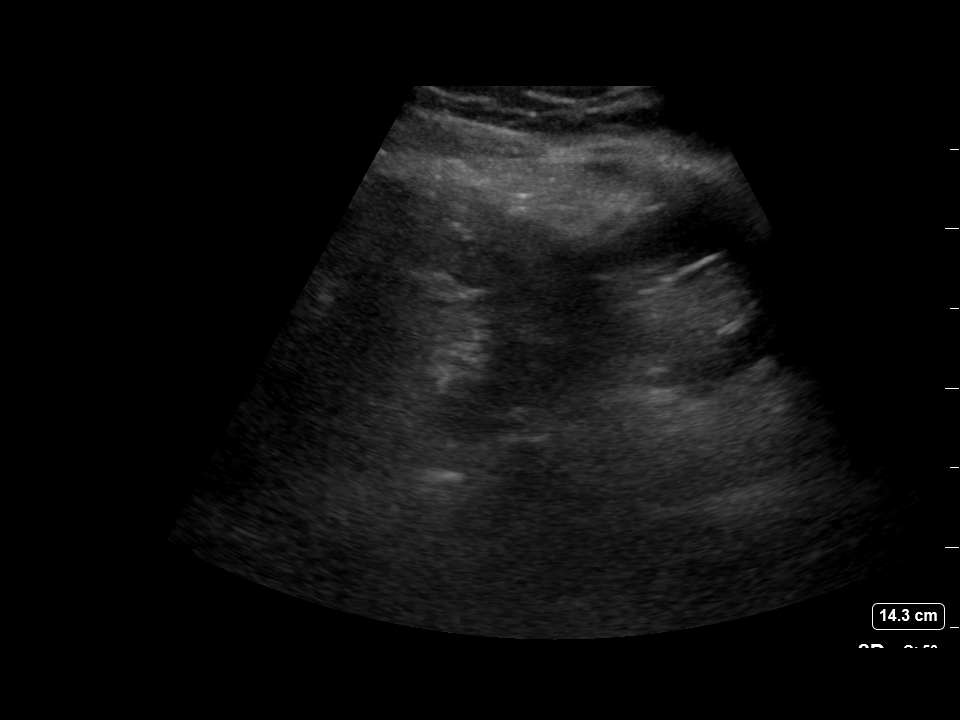
[im 3/4]
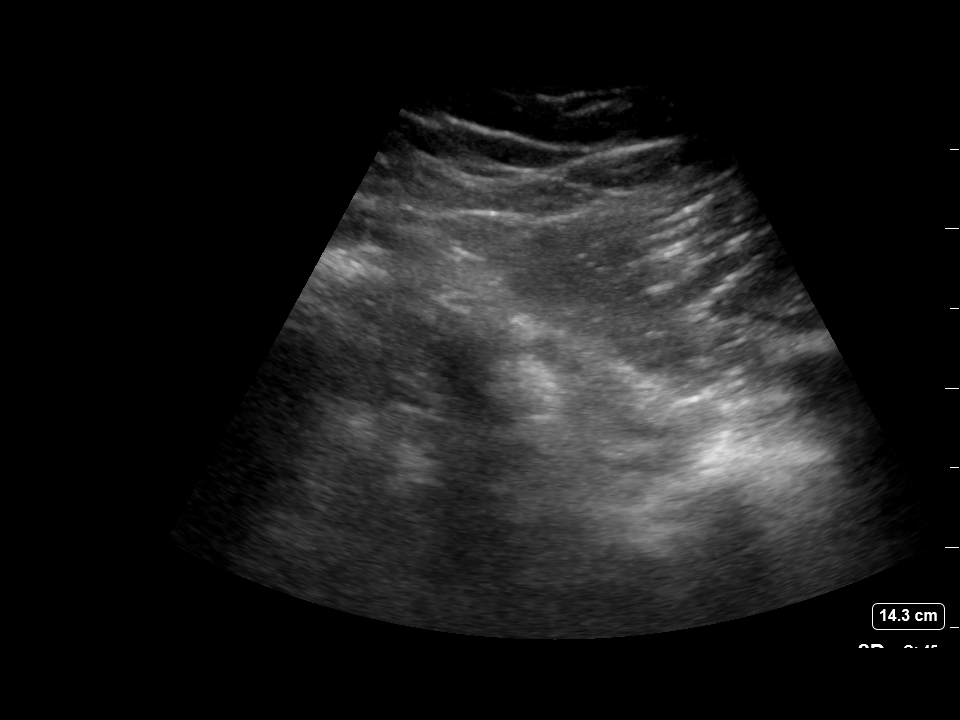
[im 4/4]
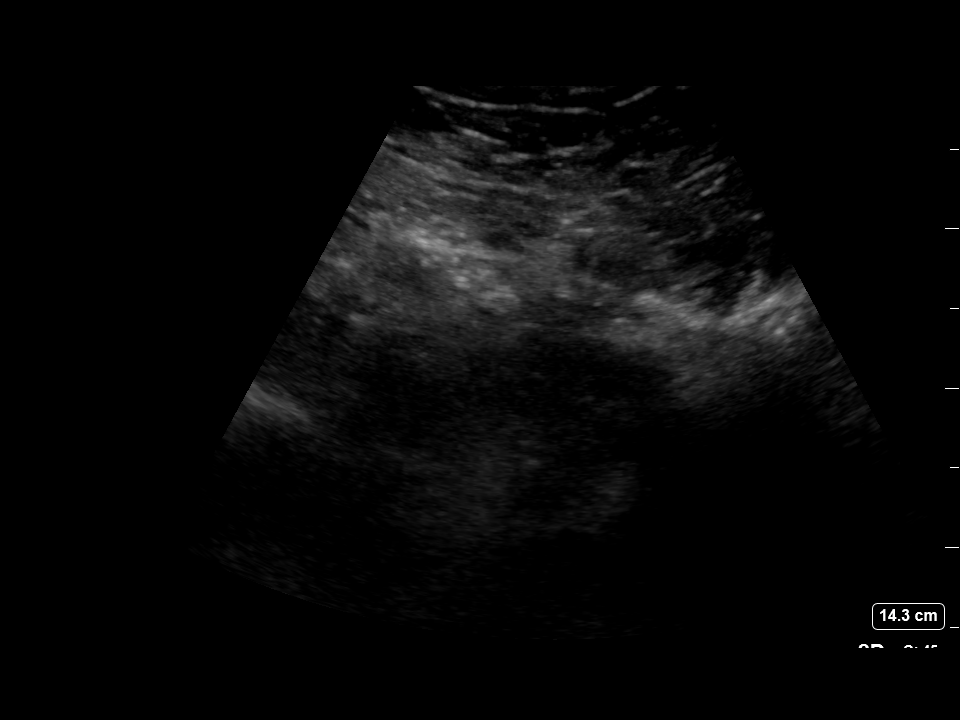

[4 of 4 positions shown; findings below may reference images not displayed]

FINDINGS: Sonographic evaluation of the abdomen was negative for any
intra-abdominal ascites. As such, paracentesis was not attempted.
IMPRESSION: No evidence of intra-abdominal ascites.  No paracentesis attempted.

## 2018-10-22 MED ORDER — LIDOCAINE HCL 1 % IJ SOLN
INTRAMUSCULAR | Status: DC | PRN
  Administered 2018-10-22: 10 mL

## 2018-10-22 MED ORDER — ALBUMIN HUMAN 25 % IV SOLN
INTRAVENOUS | Status: AC
  Filled 2018-10-22: qty 200

## 2018-10-22 MED ORDER — ALBUMIN HUMAN 25 % IV SOLN
50.0000 g | Freq: Once | INTRAVENOUS | Status: DC
  Filled 2018-10-22: qty 200

## 2018-10-22 MED ORDER — LIDOCAINE HCL 1 % IJ SOLN
INTRAMUSCULAR | Status: AC
  Filled 2018-10-22: qty 20

## 2018-10-22 NOTE — Procedures (Signed)
PROCEDURE SUMMARY:  Successful US guided paracentesis from right lateral abdomen.  Yielded 5.8 liters of yellow fluid.  No immediate complications.  Pt tolerated well.   Specimen was not sent for labs.  EBL < 80mL  Docia Barrier PA-C 10/22/2018 2:23 PM

## 2018-11-24 ENCOUNTER — Ambulatory Visit: Payer: Medicare Other | Admitting: Gastroenterology

## 2018-11-24 ENCOUNTER — Other Ambulatory Visit: Payer: Self-pay

## 2018-11-24 DIAGNOSIS — K7031 Alcoholic cirrhosis of liver with ascites: Secondary | ICD-10-CM

## 2018-11-30 ENCOUNTER — Ambulatory Visit (HOSPITAL_COMMUNITY)
Admission: RE | Admit: 2018-11-30 | Discharge: 2018-11-30 | Disposition: A | Discharge status: Home / Self Care | Payer: Medicare Other | Source: Ambulatory Visit | Attending: Gastroenterology | Admitting: Gastroenterology

## 2018-11-30 ENCOUNTER — Encounter (HOSPITAL_COMMUNITY): Payer: Self-pay | Admitting: Radiology

## 2018-11-30 ENCOUNTER — Other Ambulatory Visit: Payer: Self-pay

## 2018-11-30 DIAGNOSIS — K7031 Alcoholic cirrhosis of liver with ascites: Secondary | ICD-10-CM | POA: Diagnosis present

## 2018-11-30 HISTORY — PX: IR PARACENTESIS: IMG2679

## 2018-11-30 MED ORDER — LIDOCAINE HCL 1 % IJ SOLN
INTRAMUSCULAR | Status: AC
  Filled 2018-11-30: qty 20

## 2018-11-30 MED ORDER — LIDOCAINE HCL 1 % IJ SOLN
INTRAMUSCULAR | Status: DC | PRN
  Administered 2018-11-30: 10 mL

## 2018-11-30 NOTE — Procedures (Signed)
   US guided RLQ paracentesis  3.7 L yellow fluid  Tolerated well

## 2018-12-30 ENCOUNTER — Other Ambulatory Visit (INDEPENDENT_AMBULATORY_CARE_PROVIDER_SITE_OTHER): Payer: Medicare Other

## 2018-12-30 ENCOUNTER — Encounter: Payer: Self-pay | Admitting: Gastroenterology

## 2018-12-30 ENCOUNTER — Ambulatory Visit (INDEPENDENT_AMBULATORY_CARE_PROVIDER_SITE_OTHER): Payer: Medicare Other | Admitting: Gastroenterology

## 2018-12-30 VITALS — BP 150/90 | HR 102 | Temp 98.1°F | Ht 60.0 in | Wt 189.0 lb

## 2018-12-30 DIAGNOSIS — K7460 Unspecified cirrhosis of liver: Secondary | ICD-10-CM

## 2018-12-30 LAB — CBC WITH DIFFERENTIAL/PLATELET
Basophils Absolute: 0.1 10*3/uL (ref 0.0–0.1)
Basophils Relative: 1.4 % (ref 0.0–3.0)
Eosinophils Absolute: 0.1 10*3/uL (ref 0.0–0.7)
Eosinophils Relative: 1.3 % (ref 0.0–5.0)
HCT: 36.3 % (ref 36.0–46.0)
Hemoglobin: 11.6 g/dL — ABNORMAL LOW (ref 12.0–15.0)
Lymphocytes Relative: 20.8 % (ref 12.0–46.0)
Lymphs Abs: 1.2 10*3/uL (ref 0.7–4.0)
MCHC: 31.8 g/dL (ref 30.0–36.0)
MCV: 85.2 fl (ref 78.0–100.0)
Monocytes Absolute: 0.4 10*3/uL (ref 0.1–1.0)
Monocytes Relative: 7.2 % (ref 3.0–12.0)
Neutro Abs: 4 10*3/uL (ref 1.4–7.7)
Neutrophils Relative %: 69.3 % (ref 43.0–77.0)
Platelets: 170 10*3/uL (ref 150.0–400.0)
RBC: 4.26 Mil/uL (ref 3.87–5.11)
RDW: 15.3 % (ref 11.5–15.5)
WBC: 5.7 10*3/uL (ref 4.0–10.5)

## 2018-12-30 LAB — COMPREHENSIVE METABOLIC PANEL
ALT: 15 U/L (ref 0–35)
AST: 33 U/L (ref 0–37)
Albumin: 4 g/dL (ref 3.5–5.2)
Alkaline Phosphatase: 161 U/L — ABNORMAL HIGH (ref 39–117)
BUN: 26 mg/dL — ABNORMAL HIGH (ref 6–23)
CO2: 16 mEq/L — ABNORMAL LOW (ref 19–32)
Calcium: 7.8 mg/dL — ABNORMAL LOW (ref 8.4–10.5)
Chloride: 105 mEq/L (ref 96–112)
Creatinine, Ser: 2.23 mg/dL — ABNORMAL HIGH (ref 0.40–1.20)
GFR: 26.96 mL/min — ABNORMAL LOW (ref >60.00)
Glucose, Bld: 100 mg/dL — ABNORMAL HIGH (ref 70–99)
Potassium: 4.3 mEq/L (ref 3.5–5.1)
Sodium: 135 mEq/L (ref 135–145)
Total Bilirubin: 0.4 mg/dL (ref 0.2–1.2)
Total Protein: 8.8 g/dL — ABNORMAL HIGH (ref 6.0–8.3)

## 2018-12-30 LAB — PROTIME-INR
INR: 1 ratio (ref 0.8–1.0)
Prothrombin Time: 11.9 s (ref 9.6–13.1)

## 2018-12-30 MED ORDER — SPIRONOLACTONE 25 MG PO TABS: 25.0000 mg | ORAL_TABLET | Freq: Every day | ORAL | 1 refills | Status: DC

## 2018-12-30 MED ORDER — SPIRONOLACTONE 50 MG PO TABS: 50.0000 mg | ORAL_TABLET | Freq: Every day | ORAL | 1 refills | Status: DC

## 2018-12-30 MED ORDER — FUROSEMIDE 20 MG PO TABS: ORAL_TABLET | ORAL | 1 refills | Status: DC

## 2018-12-30 NOTE — Progress Notes (Signed)
Review of pertinent gastrointestinal problems: 1. 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 MELD 12 (11/2017 labs)Moved to [facility] 2018, presented with tense ascites. This is been a problem for her for about one year prior area [place] of state at [place] on one [place], never saw gastroenterologist or hepatologist. Presenting labs: T Bili,platelets, INR all normal, creatinine 2.1;  Labs workup for cirrhosis: 10/2016: Alpha I antitrypsin level normal, ceruloplasm normal, Hep A total A negative. ASMA neg, Ferritin 12, ANA + (1:40 titer), Hepatitis C antibody negative, hepatitis B surface antigen negative, hepatitis B surface antibody negative.  AFP 08/2016 normal  EGD 11/2016 small varices, mild portal gastropathy, started nadolol 40mg  once daily  Liver imaging: 11/2017 CT scan abd pelv with IV contrast: liver nodular, no focal lesions,   Repeated LVPs (ranging 6-8 liters, followed by albumin): SAAG not yet checked, No SBP; instituted serial LVPs(+albumin)every two weeks starting mid July. Have discussed TIPS several times, she is not interested.  Completely stopeddrinking alcohol as of August 2018. Suffered rectus sheath hematoma requiring hosp admission after LVP 11/2017.  Hepatitis A/B immunization seriesstarted 2018  She has shownrepeatedlythat she is unable to follow up with basic medical recommendations including lab draws, basicknowledge of her due to diuretic doses.      HPI: This is a 63 year old woman whom I last saw about a year ago   I last saw her about a year ago.  At that office visit I asked that she see me 2 months from them.  We set up that appointment.  She never made the appointment.  She calls every few weeks and ask for paracentesis.  She has been getting those about once a month.  .  There are no lab tests in our epic system for over a year on her.  At that office visit last year she admitted she had stopped taking her diuretics.  I explained to her  that I definitely wanted her on diuretics especially with her massive ascites and lower extremity edema.  I rechecked her blood counts, her electrolytes and recommended she start Aldactone 75 mg twice daily instead of Lasix 60 mg once daily.  She has never started those medicines.  A year has passed.  She wants me to fill her blood pressure pills today.  She tells me as she is moving to [place] in 7 months.  She had no overt GI bleeding.  No obvious encephalopathy  Chief complaint is decompensated cirrhosis massive ascites  ROS: complete GI ROS as described in HPI, all other review negative.  Constitutional:  No unintentional weight loss   Past Medical History:  Diagnosis Date  . Ascites   . Breast cancer ([place])   . Cancer ([place])   . Chronic anticoagulation   . Cirrhosis ([place])   . Dyspnea    with fluid buildup   . GERD (gastroesophageal reflux disease)   . Hypertension   . Hypotension     Past Surgical History:  Procedure Laterality Date  . BREAST LUMPECTOMY    . BREAST SURGERY    . CESAREAN SECTION    . ESOPHAGOGASTRODUODENOSCOPY (EGD) WITH PROPOFOL N/A 11/21/2016   Procedure: ESOPHAGOGASTRODUODENOSCOPY;  Surgeon: Milus Banister, MD;  Location: [facility] ENDOSCOPY;  Service: Endoscopy;  Laterality: N/A;  . IR PARACENTESIS  09/04/2016  . IR PARACENTESIS  09/20/2016  . IR PARACENTESIS  10/22/2016  . IR PARACENTESIS  01/27/2017  . IR PARACENTESIS  04/11/2017  . IR PARACENTESIS  06/05/2017  . IR PARACENTESIS  06/05/2017  .  IR PARACENTESIS  06/19/2017  . IR PARACENTESIS  07/03/2017  . IR PARACENTESIS  07/17/2017  . IR PARACENTESIS  08/01/2017  . IR PARACENTESIS  08/14/2017  . IR PARACENTESIS  08/28/2017  . IR PARACENTESIS  09/11/2017  . IR PARACENTESIS  09/25/2017  . IR PARACENTESIS  10/08/2017  . IR PARACENTESIS  10/23/2017  . IR PARACENTESIS  11/06/2017  . IR PARACENTESIS  11/20/2017  . IR PARACENTESIS  12/04/2017  . IR PARACENTESIS  12/18/2017  . IR PARACENTESIS  01/19/2018  . IR  PARACENTESIS  02/09/2018  . IR PARACENTESIS  02/24/2018  . IR PARACENTESIS  03/16/2018  . IR PARACENTESIS  04/10/2018  . IR PARACENTESIS  05/08/2018  . IR PARACENTESIS  06/11/2018  . IR PARACENTESIS  07/07/2018  . IR PARACENTESIS  09/04/2018  . IR PARACENTESIS  10/22/2018  . IR PARACENTESIS  11/30/2018  . IR RADIOLOGIST EVAL & MGMT  12/23/2017  . LYMPH NODE DISSECTION      Current Outpatient Medications  Medication Sig Dispense Refill  . amLODipine (NORVASC) 5 MG tablet Take 1 tablet (5 mg total) by mouth daily. 90 tablet 0  . diphenhydrAMINE (BENADRYL) 25 MG tablet Take 25 mg by mouth every 6 (six) hours as needed for itching.    . furosemide (LASIX) 40 MG tablet Take 1.5 tablets (60 mg total) by mouth daily. 45 tablet 3  . hydrALAZINE (APRESOLINE) 25 MG tablet Take 1 tablet (25 mg total) by mouth 3 (three) times daily. 270 tablet 0  . magnesium oxide (MAG-OX) 400 MG tablet Take 400 mg by mouth as needed.     . metoprolol tartrate (LOPRESSOR) 25 MG tablet Take 1 tablet (25 mg total) by mouth 2 (two) times daily. 60 tablet 0  . omeprazole (PRILOSEC) 40 MG capsule Take 1 capsule (40 mg total) by mouth daily. (Patient not taking: Reported on 12/30/2018) 30 capsule 1  . oxyCODONE (OXY IR/ROXICODONE) 5 MG immediate release tablet Take 1 tablet (5 mg total) by mouth every 6 (six) hours as needed for up to 5 doses for moderate pain. (Patient not taking: Reported on 12/30/2018) 5 tablet 0  . spironolactone (ALDACTONE) 50 MG tablet Take 1.5 tablets (75 mg total) by mouth 2 (two) times daily. 90 tablet 3   No current facility-administered medications for this visit.     Allergies as of 12/30/2018 - Review Complete 12/30/2018  Allergen Reaction Noted  . Compazine [prochlorperazine edisylate] Anxiety 08/07/2016  . Meclizine Anxiety 08/07/2016    Family History  Problem Relation Age of Onset  . Diabetes Mother   . Diabetes Father   . Diabetes Brother   . Colon cancer Neg Hx   . Stomach cancer Neg Hx    . Esophageal cancer Neg Hx     Social History   Socioeconomic History  . Marital status: Single    Spouse name: Not on file  . Number of children: Not on file  . Years of education: Not on file  . Highest education level: Not on file  Occupational History  . Not on file  Social Needs  . Financial resource strain: Not on file  . Food insecurity    Worry: Not on file    Inability: Not on file  . Transportation needs    Medical: Not on file    Non-medical: Not on file  Tobacco Use  . Smoking status: Never Smoker  . Smokeless tobacco: Never Used  Substance and Sexual Activity  . Alcohol use: No  Comment: occassional wine   . Drug use: No  . Sexual activity: Not on file  Lifestyle  . Physical activity    Days per week: Not on file    Minutes per session: Not on file  . Stress: Not on file  Relationships  . Social Herbalist on phone: Not on file    Gets together: Not on file    Attends religious service: Not on file    Active member of club or organization: Not on file    Attends meetings of clubs or organizations: Not on file    Relationship status: Not on file  . Intimate partner violence    Fear of current or ex partner: Not on file    Emotionally abused: Not on file    Physically abused: Not on file    Forced sexual activity: Not on file  Other Topics Concern  . Not on file  Social History Narrative  . Not on file     Physical Exam: BP (!) 150/90   Pulse (!) 102   Temp 98.1 F (36.7 C)   Ht 5' (1.524 m)   Wt 189 lb (85.7 kg)   BMI 36.91 kg/m  Constitutional: generally well-appearing Psychiatric: alert and oriented x3 Abdomen: soft, nontender, nondistended, large amount of ascites, no peritoneal signs, normal bowel sounds 1-2+ lower extremity edema bilaterally in her ankles  Assessment and plan: 63 y.o. female with decompensated cirrhosis, extreme noncompliance with medical recommendations  She is not following up with lab tests, with  medication recommendations, with follow-up visits.  She is still not taking any diuretics for her large, massive ascites.  Instead she is just getting marginalized paracentesis about once a month.  I recommended very strongly that she restart her diuretics so that this can help space out her paracentesis.  I am going to start her on the previous dose that I had recommended for her about a year ago which is Lasix 60 mg once daily, Aldactone 75 mg twice daily.  I am only giving her a 13-month prescription for this.  This can get her to her next follow-up appointment with me.  She needs restaging of her liver disease with complete metabolic profile, coags, CBC, alpha-fetoprotein, abdominal ultrasound for hepatoma screening.  We will try to help her get a new primary care physician, she does not have one.  I recommend she low-salt diet and avoid NSAIDs.  I was very clear to her in the office today that it is very difficult and dangerous taking care of her since she does not comply with really any of my recommendations.  If she fails to comply again she understands that she will be discharged from the practice.  Please see the "Patient Instructions" section for addition details about the plan.  Owens Loffler, MD [facility] 12/30/2018, 4:12 PM

## 2018-12-30 NOTE — Patient Instructions (Signed)
You have been scheduled for an abdominal ultrasound at [facility] Radiology (1st floor of hospital) on 01/05/19 at 10am. Please arrive 15 minutes prior to your appointment for registration. Make certain not to have anything to eat or drink 6 hours prior to your appointment. Should you need to reschedule your appointment, please contact radiology at [phone]. This test typically takes about 30 minutes to perform.  Your provider has requested that you go to the basement level for lab work before leaving today. Press "B" on the elevator. The lab is located at the first door on the left as you exit the elevator.  You need to be on a low salt diet daily  Next follow up appointment is on 02/23/19 at 1:50pm with Dr Ardis Hughs  If you fail to comply with these recommendations we may not be able to continue your care with Dr Ardis Hughs  We will contact you with a new PCP  Thank you for entrusting me with your care and choosing [facility].  Dr Ardis Hughs

## 2018-12-31 ENCOUNTER — Telehealth: Payer: Self-pay | Admitting: Gastroenterology

## 2018-12-31 ENCOUNTER — Other Ambulatory Visit: Payer: Self-pay | Admitting: Family Medicine

## 2018-12-31 LAB — AFP TUMOR MARKER: AFP-Tumor Marker: 4.8 ng/mL

## 2018-12-31 NOTE — Telephone Encounter (Signed)
Dr Rush Landmark is this ok with you?

## 2018-12-31 NOTE — Telephone Encounter (Signed)
Dr Ardis Hughs the pt is calling saying that her appt yesterday with you was very upsetting.  She says you treated her like a child and like she was stupid.  She wishes to change to Dr Rush Landmark.  She states that after her visit yesterday she could not sleep and her blood pressure went up during the night.  She says if she has to be seen by Dr Ardis Hughs she will change practices.  Please Arnoldo Hooker

## 2018-12-31 NOTE — Telephone Encounter (Signed)
Switching to Dr. Donneta Romberg care is OK with me if it is OK with him.

## 2019-01-01 ENCOUNTER — Telehealth: Payer: Self-pay | Admitting: Gastroenterology

## 2019-01-01 NOTE — Telephone Encounter (Signed)
Dr Ardis Hughs I spoke with the pt to let her know that Dr Rush Landmark would not be accepting her as a patient. She began to tell me that she was going to turn Dr Ardis Hughs into the medical board and make sure that I tell him that.  I did also give her the results of her labs and gave her the information regarding Nephrologist and PCP.  She says that Dr Ardis Hughs does not need to worry about that and she will be finding another practice.  I cancelled the upcoming appt per pt request.

## 2019-01-01 NOTE — Telephone Encounter (Signed)
Please lets go ahead with formal discharge from the practice as well.

## 2019-01-01 NOTE — Telephone Encounter (Signed)
She has very well-documented noncompliance with both follow-up appointments, medications which I have prescribed.  We discussed this at her office visit earlier this week and I explained to her that I am willing to give her 1 more chance and if she cannot begin to comply that I would dismiss her from the practice..  I am still happy to do that.  Please reiterate return office visit with me in 6 or 8 weeks.  I am not willing to prescribe any of her cirrhosis related medicines any further out than that appointment.

## 2019-01-01 NOTE — Telephone Encounter (Signed)
I have reviewed the chart. At this time, I do not feel that a transfer of care would directly change her management as has been outlined and provided by Dr. Ardis Hughs. I decline transfer/switch. Thank you. GM

## 2019-01-01 NOTE — Telephone Encounter (Signed)
Alicia Banister, MD  Timothy Lasso, RN          Please call the patient. Her renal function is deteriorating a bit. This might be related to her cirrhosis. Please tell her to not start the diuretics which I had recommended that she restart in the office earlier this week. She needs a referral to a nephrologist. Please also urged her again to get set up with a primary care physician. She needs a basic metabolic profile in 2 weeks. Remind her she should be on a low-salt diet, strict no NSAIDs.

## 2019-01-01 NOTE — Telephone Encounter (Signed)
The dismissal form and letter put on Dr Ardis Hughs desk for signature.

## 2019-01-01 NOTE — Telephone Encounter (Signed)
Left message on machine to call back  

## 2019-01-05 ENCOUNTER — Ambulatory Visit (HOSPITAL_COMMUNITY): Payer: Medicare Other

## 2019-01-06 ENCOUNTER — Encounter: Payer: Self-pay | Admitting: Gastroenterology

## 2019-01-08 ENCOUNTER — Telehealth: Payer: Self-pay | Admitting: Gastroenterology

## 2019-01-08 NOTE — Telephone Encounter (Signed)
Patient dismissed from [facility] by Dr. Owens Loffler, effective 01/01/2019. Dismissal letter was sent by registered mail. js

## 2019-01-13 ENCOUNTER — Other Ambulatory Visit: Payer: Self-pay | Admitting: Physician Assistant

## 2019-01-14 ENCOUNTER — Encounter (HOSPITAL_COMMUNITY): Payer: Self-pay | Admitting: Radiology

## 2019-01-14 ENCOUNTER — Ambulatory Visit (HOSPITAL_COMMUNITY)
Admission: RE | Admit: 2019-01-14 | Discharge: 2019-01-14 | Disposition: A | Discharge status: Home / Self Care | Payer: Medicare Other | Source: Ambulatory Visit | Attending: Gastroenterology | Admitting: Gastroenterology

## 2019-01-14 ENCOUNTER — Other Ambulatory Visit: Payer: Self-pay

## 2019-01-14 DIAGNOSIS — K7031 Alcoholic cirrhosis of liver with ascites: Secondary | ICD-10-CM | POA: Diagnosis present

## 2019-01-14 HISTORY — PX: IR PARACENTESIS: IMG2679

## 2019-01-14 MED ORDER — LIDOCAINE HCL 1 % IJ SOLN
INTRAMUSCULAR | Status: AC
  Filled 2019-01-14: qty 20

## 2019-01-14 MED ORDER — LIDOCAINE HCL (PF) 1 % IJ SOLN
INTRAMUSCULAR | Status: DC | PRN
  Administered 2019-01-14: 10 mL

## 2019-01-14 NOTE — Procedures (Signed)
PROCEDURE SUMMARY:  Successful US guided paracentesis from RLQ.  Yielded 3.7 L of clear yellow fluid.  No immediate complications.  Pt tolerated well.   Specimen was not sent for labs.  EBL < 59mL  Ascencion Dike PA-C 01/14/2019 10:08 AM

## 2019-02-23 ENCOUNTER — Ambulatory Visit: Payer: Medicare Other | Admitting: Gastroenterology

## 2019-04-05 DIAGNOSIS — Z853 Personal history of malignant neoplasm of breast: Secondary | ICD-10-CM | POA: Insufficient documentation

## 2019-04-27 ENCOUNTER — Other Ambulatory Visit (HOSPITAL_COMMUNITY): Payer: Self-pay | Admitting: Gastroenterology

## 2019-04-27 ENCOUNTER — Other Ambulatory Visit: Payer: Self-pay | Admitting: Gastroenterology

## 2019-04-27 DIAGNOSIS — K7031 Alcoholic cirrhosis of liver with ascites: Secondary | ICD-10-CM

## 2019-04-30 ENCOUNTER — Ambulatory Visit (HOSPITAL_COMMUNITY)
Admission: RE | Admit: 2019-04-30 | Discharge: 2019-04-30 | Disposition: A | Discharge status: Home / Self Care | Payer: Medicare Other | Source: Ambulatory Visit | Attending: Gastroenterology | Admitting: Gastroenterology

## 2019-04-30 ENCOUNTER — Other Ambulatory Visit: Payer: Self-pay

## 2019-04-30 DIAGNOSIS — K7031 Alcoholic cirrhosis of liver with ascites: Secondary | ICD-10-CM | POA: Insufficient documentation

## 2019-04-30 HISTORY — PX: IR PARACENTESIS: IMG2679

## 2019-04-30 MED ORDER — LIDOCAINE HCL 1 % IJ SOLN
INTRAMUSCULAR | Status: AC
  Filled 2019-04-30: qty 20

## 2019-04-30 NOTE — Procedures (Signed)
PROCEDURE SUMMARY:  Successful US guided paracentesis from LLQ.  Yielded 900 mL of clear yellow fluid.  No immediate complications.  Pt tolerated well.   Specimen was not sent for labs.  EBL < 71mL  Ascencion Dike PA-C 04/30/2019 2:40 PM

## 2019-05-11 ENCOUNTER — Other Ambulatory Visit: Payer: Self-pay | Admitting: Gastroenterology

## 2019-05-18 ENCOUNTER — Encounter (HOSPITAL_COMMUNITY): Payer: Self-pay | Admitting: Gastroenterology

## 2019-05-19 NOTE — Progress Notes (Signed)
Attempted to obtain medical history via telephone, unable to reach at this time. I left a voicemail to return pre surgical testing department's phone call.  

## 2019-05-21 ENCOUNTER — Other Ambulatory Visit: Payer: Self-pay | Admitting: Gastroenterology

## 2019-05-21 ENCOUNTER — Other Ambulatory Visit (HOSPITAL_COMMUNITY)
Admission: RE | Admit: 2019-05-21 | Discharge: 2019-05-21 | Disposition: A | Discharge status: Home / Self Care | Payer: Medicare Other | Source: Ambulatory Visit | Attending: Gastroenterology | Admitting: Gastroenterology

## 2019-05-21 DIAGNOSIS — Z01812 Encounter for preprocedural laboratory examination: Secondary | ICD-10-CM | POA: Insufficient documentation

## 2019-05-21 DIAGNOSIS — Z20822 Contact with and (suspected) exposure to covid-19: Secondary | ICD-10-CM | POA: Diagnosis not present

## 2019-05-21 LAB — SARS CORONAVIRUS 2 (TAT 6-24 HRS): SARS Coronavirus 2: NEGATIVE

## 2019-05-25 ENCOUNTER — Encounter (HOSPITAL_COMMUNITY)
Admission: RE | Disposition: A | Discharge status: Home / Self Care | Payer: Self-pay | Source: Home / Self Care | Attending: Gastroenterology

## 2019-05-25 ENCOUNTER — Ambulatory Visit (HOSPITAL_COMMUNITY)
Admission: RE | Admit: 2019-05-25 | Discharge: 2019-05-25 | Disposition: A | Discharge status: Home / Self Care | Payer: Medicare Other | Attending: Gastroenterology | Admitting: Gastroenterology

## 2019-05-25 ENCOUNTER — Ambulatory Visit (HOSPITAL_COMMUNITY): Payer: Medicare Other | Admitting: Registered Nurse

## 2019-05-25 ENCOUNTER — Encounter (HOSPITAL_COMMUNITY): Payer: Self-pay | Admitting: Gastroenterology

## 2019-05-25 ENCOUNTER — Other Ambulatory Visit: Payer: Self-pay

## 2019-05-25 DIAGNOSIS — Z79899 Other long term (current) drug therapy: Secondary | ICD-10-CM | POA: Diagnosis not present

## 2019-05-25 DIAGNOSIS — I129 Hypertensive chronic kidney disease with stage 1 through stage 4 chronic kidney disease, or unspecified chronic kidney disease: Secondary | ICD-10-CM | POA: Insufficient documentation

## 2019-05-25 DIAGNOSIS — K219 Gastro-esophageal reflux disease without esophagitis: Secondary | ICD-10-CM | POA: Diagnosis not present

## 2019-05-25 DIAGNOSIS — Z853 Personal history of malignant neoplasm of breast: Secondary | ICD-10-CM | POA: Insufficient documentation

## 2019-05-25 DIAGNOSIS — K7030 Alcoholic cirrhosis of liver without ascites: Secondary | ICD-10-CM | POA: Diagnosis not present

## 2019-05-25 DIAGNOSIS — K2970 Gastritis, unspecified, without bleeding: Secondary | ICD-10-CM | POA: Diagnosis not present

## 2019-05-25 DIAGNOSIS — K640 First degree hemorrhoids: Secondary | ICD-10-CM | POA: Insufficient documentation

## 2019-05-25 DIAGNOSIS — Z1211 Encounter for screening for malignant neoplasm of colon: Secondary | ICD-10-CM | POA: Diagnosis not present

## 2019-05-25 DIAGNOSIS — N182 Chronic kidney disease, stage 2 (mild): Secondary | ICD-10-CM | POA: Insufficient documentation

## 2019-05-25 HISTORY — PX: COLONOSCOPY WITH PROPOFOL: SHX5780

## 2019-05-25 HISTORY — DX: Portal hypertension: K76.6

## 2019-05-25 HISTORY — DX: Chronic kidney disease, stage 3 unspecified: N18.30

## 2019-05-25 HISTORY — PX: ESOPHAGOGASTRODUODENOSCOPY (EGD) WITH PROPOFOL: SHX5813

## 2019-05-25 SURGERY — COLONOSCOPY WITH PROPOFOL
Anesthesia: Monitor Anesthesia Care

## 2019-05-25 MED ORDER — PROPOFOL 10 MG/ML IV BOLUS
INTRAVENOUS | Status: DC | PRN
  Administered 2019-05-25: 10 mg via INTRAVENOUS
  Administered 2019-05-25: 20 mg via INTRAVENOUS
  Administered 2019-05-25 (×5): 10 mg via INTRAVENOUS

## 2019-05-25 MED ORDER — SODIUM CHLORIDE 0.9 % IV SOLN
INTRAVENOUS | Status: DC
  Administered 2019-05-25: 1000 mL via INTRAVENOUS

## 2019-05-25 MED ORDER — SODIUM CHLORIDE 0.9 % IV SOLN: INTRAVENOUS | Status: DC

## 2019-05-25 MED ORDER — PROPOFOL 10 MG/ML IV BOLUS
INTRAVENOUS | Status: AC
  Filled 2019-05-25: qty 20

## 2019-05-25 MED ORDER — PROPOFOL 500 MG/50ML IV EMUL
INTRAVENOUS | Status: DC | PRN
  Administered 2019-05-25: 135 ug/kg/min via INTRAVENOUS

## 2019-05-25 MED ORDER — PROPOFOL 500 MG/50ML IV EMUL
INTRAVENOUS | Status: AC
  Filled 2019-05-25: qty 50

## 2019-05-25 SURGICAL SUPPLY — 24 items

## 2019-05-25 NOTE — Anesthesia Postprocedure Evaluation (Signed)
Anesthesia Post Note  Patient: Alicia [name]  Procedure(s) Performed: COLONOSCOPY WITH PROPOFOL (N/A ) ESOPHAGOGASTRODUODENOSCOPY (EGD) WITH PROPOFOL (N/A )     Patient location during evaluation: Endoscopy Anesthesia Type: MAC Level of consciousness: awake and alert, patient cooperative and oriented Pain management: pain level controlled Vital Signs Assessment: post-procedure vital signs reviewed and stable Respiratory status: spontaneous breathing, nonlabored ventilation and respiratory function stable Cardiovascular status: blood pressure returned to baseline and stable Postop Assessment: no apparent nausea or vomiting and able to ambulate Anesthetic complications: no    Last Vitals:  Vitals:   05/25/19 1050 05/25/19 1055  BP: (!) 158/91   Pulse: 78 79  Resp: 17 16  Temp:    SpO2: 100% 99%    Last Pain:  Vitals:   05/25/19 1035  TempSrc:   PainSc: 0-No pain                 Feliciano Wynter,E. Kam Kushnir

## 2019-05-25 NOTE — H&P (Signed)
Date of Initial H&P: 05/21/19  History reviewed, patient examined, no change in status, stable for surgery.  

## 2019-05-25 NOTE — Discharge Instructions (Signed)

## 2019-05-25 NOTE — Interval H&P Note (Signed)
History and Physical Interval Note:  05/25/2019 9:54 AM  Alicia [name]  has presented today for surgery, with the diagnosis of Screening/Cirrhosis.  The various methods of treatment have been discussed with the patient and family. After consideration of risks, benefits and other options for treatment, the patient has consented to  Procedure(s): COLONOSCOPY WITH PROPOFOL (N/A) ESOPHAGOGASTRODUODENOSCOPY (EGD) WITH PROPOFOL (N/A) as a surgical intervention.  The patient's history has been reviewed, patient examined, no change in status, stable for surgery.  I have reviewed the patient's chart and labs.  Questions were answered to the patient's satisfaction.     Lear Ng

## 2019-05-25 NOTE — Anesthesia Preprocedure Evaluation (Signed)
Anesthesia Evaluation  Patient identified by MRN, date of birth, ID band Patient awake    Reviewed: Allergy & Precautions, NPO status , Patient's Chart, lab work & pertinent test results  History of Anesthesia Complications Negative for: history of anesthetic complications  Airway Mallampati: II  TM Distance: >3 FB Neck ROM: Full    Dental  (+) Dental Advisory Given, Chipped   Pulmonary  05/21/2019 SARS coronavirus NEG   breath sounds clear to auscultation       Cardiovascular hypertension, Pt. on medications (-) angina Rhythm:Regular Rate:Normal     Neuro/Psych negative neurological ROS     GI/Hepatic GERD  Medicated and Controlled,(+) Cirrhosis   Esophageal Varices and ascites    ,   Endo/Other  negative endocrine ROS  Renal/GU Renal InsufficiencyRenal disease     Musculoskeletal   Abdominal   Peds  Hematology negative hematology ROS (+)   Anesthesia Other Findings H/o breast cancer  Reproductive/Obstetrics                             Anesthesia Physical Anesthesia Plan  ASA: III  Anesthesia Plan: MAC   Post-op Pain Management:    Induction:   PONV Risk Score and Plan: 2 and Ondansetron and Treatment may vary due to age or medical condition  Airway Management Planned: Natural Airway and Nasal Cannula  Additional Equipment:   Intra-op Plan:   Post-operative Plan:   Informed Consent: I have reviewed the patients History and Physical, chart, labs and discussed the procedure including the risks, benefits and alternatives for the proposed anesthesia with the patient or authorized representative who has indicated his/her understanding and acceptance.     Dental advisory given  Plan Discussed with: CRNA and Surgeon  Anesthesia Plan Comments:         Anesthesia Quick Evaluation

## 2019-05-25 NOTE — Anesthesia Procedure Notes (Signed)
Procedure Name: MAC Date/Time: 05/25/2019 9:58 AM Performed by: Niel Hummer, CRNA Pre-anesthesia Checklist: Patient identified, Emergency Drugs available, Suction available and Patient being monitored Patient Re-evaluated:Patient Re-evaluated prior to induction Oxygen Delivery Method: Simple face mask

## 2019-05-25 NOTE — Op Note (Signed)
[facility] Patient Name: Alicia [name] Procedure Date: 05/25/2019 MRN: [id number] Attending MD: Lear Ng , MD Date of Birth: [birth year] CSN: [id number] Age: 64 Admit Type: Outpatient Procedure:                Colonoscopy Indications:              Screening for colorectal malignant neoplasm, Last                            colonoscopy: date unknown Providers:                Lear Ng, MD, Kary Kos, RN, Cleda Daub, RN, Theodora Blow, Technician Referring MD:              Medicines:                Propofol per Anesthesia, Monitored Anesthesia Care Complications:            No immediate complications. Estimated Blood Loss:     Estimated blood loss: none. Procedure:                Pre-Anesthesia Assessment:                           - Prior to the procedure, a History and Physical                            was performed, and patient medications and                            allergies were reviewed. The patient's tolerance of                            previous anesthesia was also reviewed. The risks                            and benefits of the procedure and the sedation                            options and risks were discussed with the patient.                            All questions were answered, and informed consent                            was obtained. Prior Anticoagulants: The patient has                            taken no previous anticoagulant or antiplatelet                            agents. ASA Grade Assessment: III - A patient with  severe systemic disease. After reviewing the risks                            and benefits, the patient was deemed in                            satisfactory condition to undergo the procedure.                           After obtaining informed consent, the colonoscope                            was passed under direct vision. Throughout the                             procedure, the patient's blood pressure, pulse, and                            oxygen saturations were monitored continuously. The                            [id number]-H190DL ([id number] Olympus pediatric colonscope                            was introduced through the anus and advanced to the                            the cecum, identified by appendiceal orifice and                            ileocecal valve. The colonoscopy was performed                            without difficulty. The patient tolerated the                            procedure well. The quality of the bowel                            preparation was adequate and good. The terminal                            ileum, ileocecal valve, appendiceal orifice, and                            rectum were photographed. Scope In: 10:18:19 AM Scope Out: 10:25:00 AM Scope Withdrawal Time: 0 hours 3 minutes 43 seconds  Total Procedure Duration: 0 hours 6 minutes 41 seconds  Findings:      The perianal and digital rectal examinations were normal.      Internal hemorrhoids were found during retroflexion. The hemorrhoids       were medium-sized and Grade I (internal hemorrhoids that do not       prolapse).      The exam was otherwise normal throughout the examined colon.  The terminal ileum appeared normal. Impression:               - Internal hemorrhoids.                           - The examined portion of the ileum was normal.                           - No specimens collected. Moderate Sedation:      Not Applicable - Patient had care per Anesthesia. Recommendation:           - Patient has a contact number available for                            emergencies. The signs and symptoms of potential                            delayed complications were discussed with the                            patient. Return to normal activities tomorrow.                            Written discharge instructions were  provided to the                            patient.                           - High fiber diet.                           - Continue present medications.                           - Repeat colonoscopy in 10 years for screening                            purposes. Procedure Code(s):        --- Professional ---                           [phone], Colonoscopy, flexible; diagnostic, including                            collection of specimen(s) by brushing or washing,                            when performed (separate procedure) Diagnosis Code(s):        --- Professional ---                           Z12.11, Encounter for screening for malignant                            neoplasm of colon  K64.0, First degree hemorrhoids CPT copyright 2019 [employer]. All rights reserved. The codes documented in this report are preliminary and upon coder review may  be revised to meet current compliance requirements. Lear Ng, MD 05/25/2019 10:42:50 AM This report has been signed electronically. Number of Addenda: 0

## 2019-05-25 NOTE — Transfer of Care (Signed)
Immediate Anesthesia Transfer of Care Note  Patient: Alicia [name]  Procedure(s) Performed: COLONOSCOPY WITH PROPOFOL (N/A ) ESOPHAGOGASTRODUODENOSCOPY (EGD) WITH PROPOFOL (N/A )  Patient Location: PACU  Anesthesia Type:MAC  Level of Consciousness: awake, alert  and oriented  Airway & Oxygen Therapy: Patient Spontanous Breathing and Patient connected to face mask oxygen  Post-op Assessment: Report given to RN, Post -op Vital signs reviewed and stable and Patient moving all extremities X 4  Post vital signs: Reviewed and stable  Last Vitals:  Vitals Value Taken Time  BP    Temp    Pulse    Resp    SpO2      Last Pain:  Vitals:   05/25/19 0908  TempSrc: Oral  PainSc: 0-No pain         Complications: No apparent anesthesia complications

## 2019-05-25 NOTE — Op Note (Signed)
[facility] Patient Name: Alicia [name] Procedure Date: 05/25/2019 MRN: [id number] Attending MD: Lear Ng , MD Date of Birth: [birth year] CSN: [id number] Age: 64 Admit Type: Outpatient Procedure:                Upper GI endoscopy Indications:              Cirrhosis rule out esophageal varices Providers:                Lear Ng, MD, Kary Kos, RN, Cleda Daub, RN, Theodora Blow, Technician Referring MD:              Medicines:                Propofol per Anesthesia, Monitored Anesthesia Care Complications:            No immediate complications. Estimated Blood Loss:     Estimated blood loss: none. Procedure:                Pre-Anesthesia Assessment:                           - Prior to the procedure, a History and Physical                            was performed, and patient medications and                            allergies were reviewed. The patient's tolerance of                            previous anesthesia was also reviewed. The risks                            and benefits of the procedure and the sedation                            options and risks were discussed with the patient.                            All questions were answered, and informed consent                            was obtained. Prior Anticoagulants: The patient has                            taken no previous anticoagulant or antiplatelet                            agents. ASA Grade Assessment: III - A patient with                            severe systemic disease. After reviewing the risks  and benefits, the patient was deemed in                            satisfactory condition to undergo the procedure.                           After obtaining informed consent, the endoscope was                            passed under direct vision. Throughout the                            procedure, the patient's blood  pressure, pulse, and                            oxygen saturations were monitored continuously. The                            [id number]-[id number] ([id number] Olympus gastroscope was                            introduced through the mouth, and advanced to the                            second part of duodenum. The upper GI endoscopy was                            accomplished without difficulty. The patient                            tolerated the procedure well. Scope In: Scope Out: Findings:      The examined esophagus was normal.      There is no endoscopic evidence of varices in the entire esophagus.      The Z-line was regular and was found 32 cm from the incisors.      Segmental moderate inflammation characterized by congestion (edema) and       erythema was found in the gastric antrum.      Patchy moderate mucosal changes characterized by congestion, erythema       and erosion were found in the duodenal bulb and in the second portion of       the duodenum. Impression:               - Normal esophagus.                           - Z-line regular, 32 cm from the incisors.                           - Gastritis.                           - Mucosal changes in the duodenum.                           - No specimens collected. Moderate Sedation:  Not Applicable - Patient had care per Anesthesia. Recommendation:           - Patient has a contact number available for                            emergencies. The signs and symptoms of potential                            delayed complications were discussed with the                            patient. Return to normal activities tomorrow.                            Written discharge instructions were provided to the                            patient.                           - Low sodium diet. Procedure Code(s):        --- Professional ---                           [phone], Esophagogastroduodenoscopy, flexible,                            transoral;  diagnostic, including collection of                            specimen(s) by brushing or washing, when performed                            (separate procedure) Diagnosis Code(s):        --- Professional ---                           K74.60, Unspecified cirrhosis of liver                           K29.70, Gastritis, unspecified, without bleeding                           K31.89, Other diseases of stomach and duodenum CPT copyright 2019 American [employer]. All rights reserved. The codes documented in this report are preliminary and upon coder review may  be revised to meet current compliance requirements. Lear Ng, MD 05/25/2019 10:37:19 AM This report has been signed electronically. Number of Addenda: 0

## 2019-05-26 ENCOUNTER — Encounter: Payer: Self-pay | Admitting: *Deleted

## 2019-05-28 DIAGNOSIS — E782 Mixed hyperlipidemia: Secondary | ICD-10-CM | POA: Insufficient documentation

## 2019-07-16 ENCOUNTER — Other Ambulatory Visit (HOSPITAL_COMMUNITY): Payer: Self-pay | Admitting: Gastroenterology

## 2019-07-16 DIAGNOSIS — K7031 Alcoholic cirrhosis of liver with ascites: Secondary | ICD-10-CM

## 2019-07-19 ENCOUNTER — Ambulatory Visit (HOSPITAL_COMMUNITY)
Admission: RE | Admit: 2019-07-19 | Discharge: 2019-07-19 | Disposition: A | Discharge status: Home / Self Care | Payer: Medicare Other | Source: Ambulatory Visit | Attending: Gastroenterology | Admitting: Gastroenterology

## 2019-07-19 ENCOUNTER — Other Ambulatory Visit: Payer: Self-pay

## 2019-07-19 DIAGNOSIS — K7031 Alcoholic cirrhosis of liver with ascites: Secondary | ICD-10-CM | POA: Diagnosis not present

## 2019-07-19 HISTORY — PX: IR PARACENTESIS: IMG2679

## 2019-07-19 LAB — ALBUMIN, PLEURAL OR PERITONEAL FLUID: Albumin, Fluid: 1.7 g/dL

## 2019-07-19 LAB — BODY FLUID CELL COUNT WITH DIFFERENTIAL
Eos, Fluid: 0 %
Lymphs, Fluid: 57 %
Monocyte-Macrophage-Serous Fluid: 37 % — ABNORMAL LOW (ref 50–90)
Neutrophil Count, Fluid: 6 % (ref 0–25)
Total Nucleated Cell Count, Fluid: 280 cu mm (ref 0–1000)

## 2019-07-19 LAB — PROTEIN, PLEURAL OR PERITONEAL FLUID: Total protein, fluid: 3.4 g/dL

## 2019-07-19 MED ORDER — LIDOCAINE HCL 1 % IJ SOLN
INTRAMUSCULAR | Status: DC | PRN
  Administered 2019-07-19: 10 mL

## 2019-07-19 MED ORDER — LIDOCAINE HCL 1 % IJ SOLN
INTRAMUSCULAR | Status: AC
  Filled 2019-07-19: qty 20

## 2019-07-19 NOTE — Procedures (Signed)
PROCEDURE SUMMARY:  Successful image-guided paracentesis from the right lateral abdomen.  Yielded 2.4 liters of clear yellow fluid.  No immediate complications.  EBL = 0 mL. Patient tolerated well.   Specimen was sent for labs.  Please see imaging section of Epic for full dictation.   Claris Pong Vallarie Fei PA-C 07/19/2019 9:35 AM

## 2019-07-20 LAB — PATHOLOGIST SMEAR REVIEW

## 2019-07-23 ENCOUNTER — Other Ambulatory Visit: Payer: Self-pay | Admitting: Nephrology

## 2019-07-23 DIAGNOSIS — N184 Chronic kidney disease, stage 4 (severe): Secondary | ICD-10-CM

## 2019-08-03 ENCOUNTER — Ambulatory Visit
Admission: RE | Admit: 2019-08-03 | Discharge: 2019-08-03 | Disposition: A | Discharge status: Home / Self Care | Payer: Medicare Other | Source: Ambulatory Visit | Attending: Nephrology | Admitting: Nephrology

## 2019-08-03 DIAGNOSIS — N184 Chronic kidney disease, stage 4 (severe): Secondary | ICD-10-CM

## 2019-08-31 DIAGNOSIS — R590 Localized enlarged lymph nodes: Secondary | ICD-10-CM | POA: Insufficient documentation

## 2019-09-22 ENCOUNTER — Other Ambulatory Visit: Payer: Self-pay | Admitting: Gastroenterology

## 2019-09-22 DIAGNOSIS — K7031 Alcoholic cirrhosis of liver with ascites: Secondary | ICD-10-CM

## 2019-10-01 DIAGNOSIS — C50919 Malignant neoplasm of unspecified site of unspecified female breast: Secondary | ICD-10-CM | POA: Insufficient documentation

## 2019-10-18 ENCOUNTER — Ambulatory Visit
Admission: RE | Admit: 2019-10-18 | Discharge: 2019-10-18 | Disposition: A | Discharge status: Home / Self Care | Payer: Medicare Other | Source: Ambulatory Visit | Attending: Gastroenterology | Admitting: Gastroenterology

## 2019-10-18 DIAGNOSIS — K7031 Alcoholic cirrhosis of liver with ascites: Secondary | ICD-10-CM

## 2020-03-08 ENCOUNTER — Other Ambulatory Visit (HOSPITAL_COMMUNITY): Payer: Self-pay | Admitting: *Deleted

## 2020-03-08 NOTE — Discharge Instructions (Signed)
  Epoetin Alfa injection What is this medicine? EPOETIN ALFA (e POE e tin AL fa) helps your body make more red blood cells. This medicine is used to treat anemia caused by chronic kidney disease, cancer chemotherapy, or HIV-therapy. It may also be used before surgery if you have anemia. This medicine may be used for other purposes; ask your health care provider or pharmacist if you have questions. COMMON BRAND NAME(S): Epogen, Procrit, Retacrit What should I tell my health care provider before I take this medicine? They need to know if you have any of these conditions:  cancer  heart disease  high blood pressure  history of blood clots  history of stroke  low levels of folate, iron, or vitamin B12 in the blood  seizures  an unusual or allergic reaction to erythropoietin, albumin, benzyl alcohol, hamster proteins, other medicines, foods, dyes, or preservatives  pregnant or trying to get pregnant  breast-feeding How should I use this medicine? This medicine is for injection into a vein or under the skin. It is usually given by a health care professional in a hospital or clinic setting. If you get this medicine at home, you will be taught how to prepare and give this medicine. Use exactly as directed. Take your medicine at regular intervals. Do not take your medicine more often than directed. It is important that you put your used needles and syringes in a special sharps container. Do not put them in a trash can. If you do not have a sharps container, call your pharmacist or healthcare provider to get one. A special MedGuide will be given to you by the pharmacist with each prescription and refill. Be sure to read this information carefully each time. Talk to your pediatrician regarding the use of this medicine in children. While this drug may be prescribed for selected conditions, precautions do apply. Overdosage: If you think you have taken too much of this medicine contact a  poison control center or emergency room at once. NOTE: This medicine is only for you. Do not share this medicine with others. What if I miss a dose? If you miss a dose, take it as soon as you can. If it is almost time for your next dose, take only that dose. Do not take double or extra doses. What may interact with this medicine? Interactions have not been studied. This list may not describe all possible interactions. Give your health care provider a list of all the medicines, herbs, non-prescription drugs, or dietary supplements you use. Also tell them if you smoke, drink alcohol, or use illegal drugs. Some items may interact with your medicine. What should I watch for while using this medicine? Your condition will be monitored carefully while you are receiving this medicine. You may need blood work done while you are taking this medicine. This medicine may cause a decrease in vitamin B6. You should make sure that you get enough vitamin B6 while you are taking this medicine. Discuss the foods you eat and the vitamins you take with your health care professional. What side effects may I notice from receiving this medicine? Side effects that you should report to your doctor or health care professional as soon as possible:  allergic reactions like skin rash, itching or hives, swelling of the face, lips, or tongue  seizures  signs and symptoms of a blood clot such as breathing problems; changes in vision; chest pain; severe, sudden headache; pain, swelling, warmth in the leg; trouble speaking; sudden   numbness or weakness of the face, arm or leg  signs and symptoms of a stroke like changes in vision; confusion; trouble speaking or understanding; severe headaches; sudden numbness or weakness of the face, arm or leg; trouble walking; dizziness; loss of balance or coordination Side effects that usually do not require medical attention (report to your doctor or health care professional if they continue  or are bothersome):  chills  cough  dizziness  fever  headaches  joint pain  muscle cramps  muscle pain  nausea, vomiting  pain, redness, or irritation at site where injected This list may not describe all possible side effects. Call your doctor for medical advice about side effects. You may report side effects to FDA at 1-800-FDA-1088. Where should I keep my medicine? Keep out of the reach of children. Store in a refrigerator between 2 and 8 degrees C (36 and 46 degrees F). Do not freeze or shake. Throw away any unused portion if using a single-dose vial. Multi-dose vials can be kept in the refrigerator for up to 21 days after the initial dose. Throw away unused medicine. NOTE: This sheet is a summary. It may not cover all possible information. If you have questions about this medicine, talk to your doctor, pharmacist, or health care provider.  2020 Elsevier/Gold Standard (2016-11-01 08:35:19) Ferumoxytol injection What is this medicine? FERUMOXYTOL is an iron complex. Iron is used to make healthy red blood cells, which carry oxygen and nutrients throughout the body. This medicine is used to treat iron deficiency anemia. This medicine may be used for other purposes; ask your health care provider or pharmacist if you have questions. COMMON BRAND NAME(S): Feraheme What should I tell my health care provider before I take this medicine? They need to know if you have any of these conditions:  anemia not caused by low iron levels  high levels of iron in the blood  magnetic resonance imaging (MRI) test scheduled  an unusual or allergic reaction to iron, other medicines, foods, dyes, or preservatives  pregnant or trying to get pregnant  breast-feeding How should I use this medicine? This medicine is for injection into a vein. It is given by a health care professional in a hospital or clinic setting. Talk to your pediatrician regarding the use of this medicine in children.  Special care may be needed. Overdosage: If you think you have taken too much of this medicine contact a poison control center or emergency room at once. NOTE: This medicine is only for you. Do not share this medicine with others. What if I miss a dose? It is important not to miss your dose. Call your doctor or health care professional if you are unable to keep an appointment. What may interact with this medicine? This medicine may interact with the following medications:  other iron products This list may not describe all possible interactions. Give your health care provider a list of all the medicines, herbs, non-prescription drugs, or dietary supplements you use. Also tell them if you smoke, drink alcohol, or use illegal drugs. Some items may interact with your medicine. What should I watch for while using this medicine? Visit your doctor or healthcare professional regularly. Tell your doctor or healthcare professional if your symptoms do not start to get better or if they get worse. You may need blood work done while you are taking this medicine. You may need to follow a special diet. Talk to your doctor. Foods that contain iron include: whole grains/cereals, dried fruits,   beans, or peas, leafy green vegetables, and organ meats (liver, kidney). What side effects may I notice from receiving this medicine? Side effects that you should report to your doctor or health care professional as soon as possible:  allergic reactions like skin rash, itching or hives, swelling of the face, lips, or tongue  breathing problems  changes in blood pressure  feeling faint or lightheaded, falls  fever or chills  flushing, sweating, or hot feelings  swelling of the ankles or feet Side effects that usually do not require medical attention (report to your doctor or health care professional if they continue or are bothersome):  diarrhea  headache  nausea, vomiting  stomach pain This list may not  describe all possible side effects. Call your doctor for medical advice about side effects. You may report side effects to FDA at 1-800-FDA-1088. Where should I keep my medicine? This drug is given in a hospital or clinic and will not be stored at home. NOTE: This sheet is a summary. It may not cover all possible information. If you have questions about this medicine, talk to your doctor, pharmacist, or health care provider.  2020 Elsevier/Gold Standard (2016-05-13 20:21:10)  

## 2020-03-09 ENCOUNTER — Other Ambulatory Visit: Payer: Self-pay

## 2020-03-09 ENCOUNTER — Encounter (HOSPITAL_COMMUNITY)
Admission: RE | Admit: 2020-03-09 | Discharge: 2020-03-09 | Disposition: A | Discharge status: Home / Self Care | Payer: Medicare Other | Source: Ambulatory Visit | Attending: Nephrology | Admitting: Nephrology

## 2020-03-09 VITALS — BP 173/80 | HR 83 | Temp 97.2°F | Resp 18

## 2020-03-09 DIAGNOSIS — N1831 Chronic kidney disease, stage 3a: Secondary | ICD-10-CM

## 2020-03-09 LAB — POCT HEMOGLOBIN-HEMACUE: Hemoglobin: 8.8 g/dL — ABNORMAL LOW (ref 12.0–15.0)

## 2020-03-09 MED ORDER — EPOETIN ALFA-EPBX 10000 UNIT/ML IJ SOLN
15000.0000 [IU] | INTRAMUSCULAR | Status: DC
  Administered 2020-03-09: 15000 [IU] via SUBCUTANEOUS

## 2020-03-09 MED ORDER — EPOETIN ALFA-EPBX 3000 UNIT/ML IJ SOLN
INTRAMUSCULAR | Status: AC
  Filled 2020-03-09: qty 1

## 2020-03-09 MED ORDER — EPOETIN ALFA-EPBX 2000 UNIT/ML IJ SOLN
INTRAMUSCULAR | Status: AC
  Filled 2020-03-09: qty 1

## 2020-03-09 MED ORDER — SODIUM CHLORIDE 0.9 % IV SOLN
510.0000 mg | INTRAVENOUS | Status: DC
  Administered 2020-03-09: 510 mg via INTRAVENOUS
  Filled 2020-03-09: qty 510

## 2020-03-09 MED ORDER — EPOETIN ALFA-EPBX 10000 UNIT/ML IJ SOLN
INTRAMUSCULAR | Status: AC
  Filled 2020-03-09: qty 1

## 2020-03-23 ENCOUNTER — Encounter (HOSPITAL_COMMUNITY)
Admission: RE | Admit: 2020-03-23 | Discharge: 2020-03-23 | Disposition: A | Discharge status: Home / Self Care | Payer: Medicare Other | Source: Ambulatory Visit | Attending: Nephrology | Admitting: Nephrology

## 2020-03-23 ENCOUNTER — Other Ambulatory Visit: Payer: Self-pay

## 2020-03-23 VITALS — BP 140/79 | HR 88 | Temp 98.6°F | Resp 18

## 2020-03-23 DIAGNOSIS — N1831 Chronic kidney disease, stage 3a: Secondary | ICD-10-CM | POA: Diagnosis not present

## 2020-03-23 LAB — POCT HEMOGLOBIN-HEMACUE: Hemoglobin: 8.3 g/dL — ABNORMAL LOW (ref 12.0–15.0)

## 2020-03-23 MED ORDER — EPOETIN ALFA-EPBX 10000 UNIT/ML IJ SOLN
15000.0000 [IU] | INTRAMUSCULAR | Status: DC
  Administered 2020-03-23: 15000 [IU] via SUBCUTANEOUS

## 2020-03-23 MED ORDER — EPOETIN ALFA-EPBX 2000 UNIT/ML IJ SOLN
INTRAMUSCULAR | Status: AC
  Filled 2020-03-23: qty 1

## 2020-03-23 MED ORDER — SODIUM CHLORIDE 0.9 % IV SOLN
510.0000 mg | INTRAVENOUS | Status: DC
  Administered 2020-03-23: 510 mg via INTRAVENOUS
  Filled 2020-03-23: qty 510

## 2020-03-23 MED ORDER — EPOETIN ALFA-EPBX 3000 UNIT/ML IJ SOLN
INTRAMUSCULAR | Status: AC
  Filled 2020-03-23: qty 1

## 2020-03-23 MED ORDER — EPOETIN ALFA-EPBX 10000 UNIT/ML IJ SOLN
INTRAMUSCULAR | Status: AC
  Filled 2020-03-23: qty 1

## 2020-04-21 ENCOUNTER — Other Ambulatory Visit: Payer: Self-pay

## 2020-04-21 DIAGNOSIS — N183 Chronic kidney disease, stage 3 unspecified: Secondary | ICD-10-CM

## 2020-05-09 ENCOUNTER — Encounter (HOSPITAL_COMMUNITY): Payer: Medicare Other

## 2020-05-09 ENCOUNTER — Other Ambulatory Visit (HOSPITAL_COMMUNITY): Payer: Medicare Other

## 2020-05-09 ENCOUNTER — Encounter: Payer: Medicare Other | Admitting: Vascular Surgery

## 2020-06-20 ENCOUNTER — Ambulatory Visit (HOSPITAL_COMMUNITY): Admission: RE | Admit: 2020-06-20 | Payer: Medicare Other | Source: Ambulatory Visit

## 2020-06-20 ENCOUNTER — Encounter: Payer: Medicare Other | Admitting: Vascular Surgery

## 2020-07-11 ENCOUNTER — Other Ambulatory Visit: Payer: Self-pay

## 2020-07-11 DIAGNOSIS — N183 Chronic kidney disease, stage 3 unspecified: Secondary | ICD-10-CM

## 2020-07-27 ENCOUNTER — Ambulatory Visit (HOSPITAL_COMMUNITY)
Admission: RE | Admit: 2020-07-27 | Discharge: 2020-07-27 | Disposition: A | Discharge status: Home / Self Care | Payer: Medicare Other | Source: Ambulatory Visit | Attending: Vascular Surgery | Admitting: Vascular Surgery

## 2020-07-27 ENCOUNTER — Ambulatory Visit (INDEPENDENT_AMBULATORY_CARE_PROVIDER_SITE_OTHER)
Admission: RE | Admit: 2020-07-27 | Discharge: 2020-07-27 | Disposition: A | Discharge status: Home / Self Care | Payer: Medicare Other | Source: Ambulatory Visit | Attending: Vascular Surgery | Admitting: Vascular Surgery

## 2020-07-27 ENCOUNTER — Other Ambulatory Visit: Payer: Self-pay

## 2020-07-27 ENCOUNTER — Ambulatory Visit (INDEPENDENT_AMBULATORY_CARE_PROVIDER_SITE_OTHER): Payer: Medicare Other | Admitting: Vascular Surgery

## 2020-07-27 ENCOUNTER — Encounter: Payer: Self-pay | Admitting: Vascular Surgery

## 2020-07-27 VITALS — BP 184/108 | HR 81 | Temp 98.1°F | Resp 20 | Ht 60.0 in | Wt 137.3 lb

## 2020-07-27 DIAGNOSIS — N183 Chronic kidney disease, stage 3 unspecified: Secondary | ICD-10-CM

## 2020-07-27 NOTE — Progress Notes (Signed)
Referring Physician: Dr. Carolin [name]  Patient name: Alicia [name] MRN: [id number] DOB: 28-Apr-1955 Sex: female  REASON FOR CONSULT: Hemodialysis access HPI: Alicia [name] is a 65 y.o. female, with CKD 3 sent for evaluation for long-term hemodialysis access.  She is left-handed.  She has not had any prior access procedures.  She does have a pre-existing right side Port-A-Cath.  She currently is being treated for metastatic breast cancer.  Other medical problems include hypertension and cirrhosis.  These are both currently stable.  Past Medical History:  Diagnosis Date  . Ascites   . Breast cancer ([place])   . Cancer ([place])   . Chronic anticoagulation   . Cirrhosis ([place])   . CKD (chronic kidney disease), stage III ([place])   . Dyspnea    with fluid buildup   . Esophageal varices ([place]) 11/2016   Small noted on ENDO  . GERD (gastroesophageal reflux disease)   . Hypertension   . Hypotension   . Portal hypertension ([facility])    Past Surgical History:  Procedure Laterality Date  . BREAST LUMPECTOMY    . BREAST SURGERY    . CESAREAN SECTION    . COLONOSCOPY WITH PROPOFOL N/A 05/25/2019   Procedure: COLONOSCOPY WITH PROPOFOL;  Surgeon: Alicia Corner, MD;  Location: [facility];  Service: Endoscopy;  Laterality: N/A;  . ESOPHAGOGASTRODUODENOSCOPY (EGD) WITH PROPOFOL N/A 11/21/2016   Procedure: ESOPHAGOGASTRODUODENOSCOPY;  Surgeon: Alicia Banister, MD;  Location: [facility];  Service: Endoscopy;  Laterality: N/A;  . ESOPHAGOGASTRODUODENOSCOPY (EGD) WITH PROPOFOL N/A 05/25/2019   Procedure: ESOPHAGOGASTRODUODENOSCOPY (EGD) WITH PROPOFOL;  Surgeon: Alicia Corner, MD;  Location: [facility];  Service: Endoscopy;  Laterality: N/A;  . IR PARACENTESIS  09/04/2016  . IR PARACENTESIS  09/20/2016  . IR PARACENTESIS  10/22/2016  . IR PARACENTESIS  01/27/2017  . IR PARACENTESIS  04/11/2017  . IR PARACENTESIS  06/05/2017  . IR PARACENTESIS  06/05/2017  . IR PARACENTESIS  06/19/2017  . IR  PARACENTESIS  07/03/2017  . IR PARACENTESIS  07/17/2017  . IR PARACENTESIS  08/01/2017  . IR PARACENTESIS  08/14/2017  . IR PARACENTESIS  08/28/2017  . IR PARACENTESIS  09/11/2017  . IR PARACENTESIS  09/25/2017  . IR PARACENTESIS  10/08/2017  . IR PARACENTESIS  10/23/2017  . IR PARACENTESIS  11/06/2017  . IR PARACENTESIS  11/20/2017  . IR PARACENTESIS  12/04/2017  . IR PARACENTESIS  12/18/2017  . IR PARACENTESIS  01/19/2018  . IR PARACENTESIS  02/09/2018  . IR PARACENTESIS  02/24/2018  . IR PARACENTESIS  03/16/2018  . IR PARACENTESIS  04/10/2018  . IR PARACENTESIS  05/08/2018  . IR PARACENTESIS  06/11/2018  . IR PARACENTESIS  07/07/2018  . IR PARACENTESIS  09/04/2018  . IR PARACENTESIS  10/22/2018  . IR PARACENTESIS  11/30/2018  . IR PARACENTESIS  01/14/2019  . IR PARACENTESIS  04/30/2019  . IR PARACENTESIS  07/19/2019  . IR RADIOLOGIST EVAL & MGMT  12/23/2017  . LYMPH NODE DISSECTION      Family History  Problem Relation Age of Onset  . Diabetes Mother   . Diabetes Father   . Diabetes Brother   . Colon cancer Neg Hx   . Stomach cancer Neg Hx   . Esophageal cancer Neg Hx     SOCIAL HISTORY: Social History   Socioeconomic History  . Marital status: Single    Spouse name: Not on file  . Number of children: Not on file  . Years of education: Not on file  .  Highest education level: Not on file  Occupational History  . Not on file  Tobacco Use  . Smoking status: Never Smoker  . Smokeless tobacco: Never Used  Vaping Use  . Vaping Use: Never used  Substance and Sexual Activity  . Alcohol use: No    Comment: occassional wine   . Drug use: No  . Sexual activity: Not on file  Other Topics Concern  . Not on file  Social History Narrative  . Not on file   Social Determinants of Health   Financial Resource Strain: Not on file  Food Insecurity: Not on file  Transportation Needs: Not on file  Physical Activity: Not on file  Stress: Not on file  Social Connections: Not on file  Intimate  Partner Violence: Not on file    Allergies  Allergen Reactions  . Compazine [Prochlorperazine Edisylate] Anxiety  . Flexeril [Cyclobenzaprine] Anxiety  . Meclizine Anxiety    Current Outpatient Medications  Medication Sig Dispense Refill  . amLODipine (NORVASC) 10 MG tablet Take 1 tablet by mouth daily.    . calcitRIOL (ROCALTROL) 0.5 MCG capsule Take by mouth.    . calcium-vitamin D (CALCIUM 500/D) 500-200 MG-UNIT tablet Take 3 tablets by mouth daily with breakfast.    . furosemide (LASIX) 40 MG tablet Take 40 mg by mouth daily.    . hydrALAZINE (APRESOLINE) 100 MG tablet Take 100 mg by mouth 2 (two) times daily.    Marland Kitchen omeprazole (PRILOSEC) 40 MG capsule Take 1 capsule (40 mg total) by mouth daily. (Patient taking differently: Take 40 mg by mouth daily as needed (indigestion).) 30 capsule 1  . sodium bicarbonate 650 MG tablet Take by mouth 3 (three) times daily as needed.    . sodium chloride (OCEAN) 0.65 % SOLN nasal spray Place 1 spray into both nostrils as needed for congestion.    Marland Kitchen spironolactone (ALDACTONE) 50 MG tablet Take 1.5 tablets (75 mg total) by mouth 2 (two) times daily. 90 tablet 3   No current facility-administered medications for this visit.    ROS:   General:  No weight loss, Fever, chills  HEENT: No recent headaches, no nasal bleeding, no visual changes, no sore throat  Neurologic: No dizziness, blackouts, seizures. No recent symptoms of stroke or mini- stroke. No recent episodes of slurred speech, or temporary blindness.  Cardiac: No recent episodes of chest pain/pressure, no shortness of breath at rest.  No shortness of breath with exertion.  Denies history of atrial fibrillation or irregular heartbeat  Vascular: No history of rest pain in feet.  No history of claudication.  No history of non-healing ulcer, No history of DVT   Pulmonary: No home oxygen, no productive cough, no hemoptysis,  No asthma or wheezing  Musculoskeletal:  [ ]  Arthritis, [ ]  Low  back pain,  [ ]  Joint pain  Hematologic:No history of hypercoagulable state.  No history of easy bleeding.  No history of anemia  Gastrointestinal: No hematochezia or melena,  No gastroesophageal reflux, no trouble swallowing  Urinary: [X]  chronic Kidney disease, [ ]  on HD - [ ]  MWF or [ ]  TTHS, [ ]  Burning with urination, [ ]  Frequent urination, [ ]  Difficulty urinating;   Skin: No rashes  Psychological: No history of anxiety,  No history of depression   Physical Examination  Vitals:   07/27/20 1533  BP: (!) 184/108  Pulse: 81  Resp: 20  Temp: 98.1 F (36.7 C)  SpO2: 99%  Weight: 137 lb 4.8 oz (62.3  kg)  Height: 5' (1.524 m)    Body mass index is 26.81 kg/m.  General:  Alert and oriented, no acute distress HEENT: Normal Neck: No JVD Chest: Right side Port-A-Cath palpable no obvious chest wall collaterals Cardiac: Regular Rate and Rhythm Skin: No rash Extremity Pulses: Absent right radial 2+ left radial pulse 2+ brachial pulses bilaterally Musculoskeletal: No deformity or edema  Neurologic: Upper and lower extremity motor 5/5 and symmetric  DATA:  Patient had a vein mapping ultrasound today all vein in the left arm was small and unusable for a fistula.  In the right arm she did have a 4 mm basilic vein.  The cephalic vein is too small for use as a fistula.  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  Brachial artery was patent with a 5 mm diameter.  Ulnar artery was patent.  Left upper extremity had essentially normal arterial findings.  ASSESSMENT: First stage right basilic vein fistula be scheduled if patient wishes to proceed.  Today she wished to think about it and discuss with her family.  She will call us in the future if she wished to have her fistula placed.  Risk benefits possible complications and procedure details including but not limited to bleeding infection fistula thrombosis Maturation ischemic  steal all discussed with the patient today.     PLAN:  Right basilic vein fistula patient to call to schedule  Ruta Hinds, MD [facility] Office: [phone]

## 2020-08-06 DIAGNOSIS — Z9289 Personal history of other medical treatment: Secondary | ICD-10-CM

## 2020-08-06 HISTORY — DX: Personal history of other medical treatment: Z92.89

## 2020-08-07 ENCOUNTER — Other Ambulatory Visit: Payer: Self-pay | Admitting: Nephrology

## 2020-08-07 DIAGNOSIS — N184 Chronic kidney disease, stage 4 (severe): Secondary | ICD-10-CM

## 2020-08-10 ENCOUNTER — Ambulatory Visit
Admission: RE | Admit: 2020-08-10 | Discharge: 2020-08-10 | Disposition: A | Discharge status: Home / Self Care | Payer: Medicare Other | Source: Ambulatory Visit | Attending: Nephrology | Admitting: Nephrology

## 2020-08-10 DIAGNOSIS — N184 Chronic kidney disease, stage 4 (severe): Secondary | ICD-10-CM

## 2020-08-30 ENCOUNTER — Emergency Department (HOSPITAL_COMMUNITY): Payer: Medicare Other

## 2020-08-30 ENCOUNTER — Inpatient Hospital Stay (HOSPITAL_COMMUNITY): Payer: Medicare Other

## 2020-08-30 ENCOUNTER — Encounter (HOSPITAL_COMMUNITY): Payer: Self-pay

## 2020-08-30 ENCOUNTER — Other Ambulatory Visit: Payer: Self-pay

## 2020-08-30 ENCOUNTER — Inpatient Hospital Stay (HOSPITAL_COMMUNITY)
Admission: EM | Admit: 2020-08-30 | Discharge: 2020-09-05 | DRG: 674 | Disposition: A | Discharge status: Home / Self Care | Payer: Medicare Other | Attending: Internal Medicine | Admitting: Internal Medicine

## 2020-08-30 DIAGNOSIS — I953 Hypotension of hemodialysis: Secondary | ICD-10-CM | POA: Diagnosis not present

## 2020-08-30 DIAGNOSIS — K219 Gastro-esophageal reflux disease without esophagitis: Secondary | ICD-10-CM | POA: Diagnosis present

## 2020-08-30 DIAGNOSIS — E871 Hypo-osmolality and hyponatremia: Secondary | ICD-10-CM | POA: Diagnosis present

## 2020-08-30 DIAGNOSIS — K766 Portal hypertension: Secondary | ICD-10-CM | POA: Diagnosis present

## 2020-08-30 DIAGNOSIS — Z853 Personal history of malignant neoplasm of breast: Secondary | ICD-10-CM

## 2020-08-30 DIAGNOSIS — R9431 Abnormal electrocardiogram [ECG] [EKG]: Secondary | ICD-10-CM | POA: Diagnosis present

## 2020-08-30 DIAGNOSIS — D649 Anemia, unspecified: Secondary | ICD-10-CM | POA: Diagnosis not present

## 2020-08-30 DIAGNOSIS — Z992 Dependence on renal dialysis: Secondary | ICD-10-CM | POA: Diagnosis not present

## 2020-08-30 DIAGNOSIS — E8809 Other disorders of plasma-protein metabolism, not elsewhere classified: Secondary | ICD-10-CM | POA: Diagnosis present

## 2020-08-30 DIAGNOSIS — N2581 Secondary hyperparathyroidism of renal origin: Secondary | ICD-10-CM | POA: Diagnosis present

## 2020-08-30 DIAGNOSIS — Z419 Encounter for procedure for purposes other than remedying health state, unspecified: Secondary | ICD-10-CM

## 2020-08-30 DIAGNOSIS — N185 Chronic kidney disease, stage 5: Secondary | ICD-10-CM | POA: Diagnosis not present

## 2020-08-30 DIAGNOSIS — Z7901 Long term (current) use of anticoagulants: Secondary | ICD-10-CM | POA: Diagnosis not present

## 2020-08-30 DIAGNOSIS — N179 Acute kidney failure, unspecified: Secondary | ICD-10-CM | POA: Diagnosis present

## 2020-08-30 DIAGNOSIS — C50919 Malignant neoplasm of unspecified site of unspecified female breast: Secondary | ICD-10-CM | POA: Diagnosis present

## 2020-08-30 DIAGNOSIS — N186 End stage renal disease: Secondary | ICD-10-CM | POA: Diagnosis present

## 2020-08-30 DIAGNOSIS — K7031 Alcoholic cirrhosis of liver with ascites: Secondary | ICD-10-CM | POA: Diagnosis present

## 2020-08-30 DIAGNOSIS — I120 Hypertensive chronic kidney disease with stage 5 chronic kidney disease or end stage renal disease: Secondary | ICD-10-CM | POA: Diagnosis present

## 2020-08-30 DIAGNOSIS — Z20822 Contact with and (suspected) exposure to covid-19: Secondary | ICD-10-CM | POA: Diagnosis present

## 2020-08-30 DIAGNOSIS — E875 Hyperkalemia: Secondary | ICD-10-CM | POA: Diagnosis present

## 2020-08-30 DIAGNOSIS — Z79899 Other long term (current) drug therapy: Secondary | ICD-10-CM

## 2020-08-30 DIAGNOSIS — I10 Essential (primary) hypertension: Secondary | ICD-10-CM | POA: Diagnosis not present

## 2020-08-30 DIAGNOSIS — N049 Nephrotic syndrome with unspecified morphologic changes: Secondary | ICD-10-CM | POA: Diagnosis present

## 2020-08-30 DIAGNOSIS — D631 Anemia in chronic kidney disease: Secondary | ICD-10-CM | POA: Diagnosis present

## 2020-08-30 DIAGNOSIS — R739 Hyperglycemia, unspecified: Secondary | ICD-10-CM | POA: Diagnosis present

## 2020-08-30 DIAGNOSIS — E872 Acidosis: Secondary | ICD-10-CM | POA: Diagnosis present

## 2020-08-30 DIAGNOSIS — Z95828 Presence of other vascular implants and grafts: Secondary | ICD-10-CM

## 2020-08-30 LAB — CBC WITH DIFFERENTIAL/PLATELET
Abs Immature Granulocytes: 0.03 10*3/uL (ref 0.00–0.07)
Basophils Absolute: 0 10*3/uL (ref 0.0–0.1)
Basophils Relative: 0 %
Eosinophils Absolute: 0.1 10*3/uL (ref 0.0–0.5)
Eosinophils Relative: 2 %
HCT: 26.8 % — ABNORMAL LOW (ref 36.0–46.0)
Hemoglobin: 7.9 g/dL — ABNORMAL LOW (ref 12.0–15.0)
Immature Granulocytes: 1 %
Lymphocytes Relative: 11 %
Lymphs Abs: 0.6 10*3/uL — ABNORMAL LOW (ref 0.7–4.0)
MCH: 26.3 pg (ref 26.0–34.0)
MCHC: 29.5 g/dL — ABNORMAL LOW (ref 30.0–36.0)
MCV: 89.3 fL (ref 80.0–100.0)
Monocytes Absolute: 0.5 10*3/uL (ref 0.1–1.0)
Monocytes Relative: 9 %
Neutro Abs: 4.1 10*3/uL (ref 1.7–7.7)
Neutrophils Relative %: 77 %
Platelets: 149 10*3/uL — ABNORMAL LOW (ref 150–400)
RBC: 3 MIL/uL — ABNORMAL LOW (ref 3.87–5.11)
RDW: 14.2 % (ref 11.5–15.5)
WBC: 5.2 10*3/uL (ref 4.0–10.5)
nRBC: 0 % (ref 0.0–0.2)

## 2020-08-30 LAB — RESP PANEL BY RT-PCR (FLU A&B, COVID) ARPGX2
Influenza A by PCR: NEGATIVE
Influenza B by PCR: NEGATIVE
SARS Coronavirus 2 by RT PCR: NEGATIVE

## 2020-08-30 LAB — COMPREHENSIVE METABOLIC PANEL
ALT: 16 U/L (ref 0–44)
AST: 27 U/L (ref 15–41)
Albumin: 2.8 g/dL — ABNORMAL LOW (ref 3.5–5.0)
Alkaline Phosphatase: 89 U/L (ref 38–126)
Anion gap: 10 (ref 5–15)
BUN: 57 mg/dL — ABNORMAL HIGH (ref 8–23)
CO2: 22 mmol/L (ref 22–32)
Calcium: 5.7 mg/dL — CL (ref 8.9–10.3)
Chloride: 102 mmol/L (ref 98–111)
Creatinine, Ser: 9.68 mg/dL — ABNORMAL HIGH (ref 0.44–1.00)
GFR, Estimated: 4 mL/min — ABNORMAL LOW (ref >60)
Glucose, Bld: 108 mg/dL — ABNORMAL HIGH (ref 70–99)
Potassium: 5.2 mmol/L — ABNORMAL HIGH (ref 3.5–5.1)
Sodium: 134 mmol/L — ABNORMAL LOW (ref 135–145)
Total Bilirubin: 0.8 mg/dL (ref 0.3–1.2)
Total Protein: 6.5 g/dL (ref 6.5–8.1)

## 2020-08-30 MED ORDER — HYDRALAZINE HCL 50 MG PO TABS
100.0000 mg | ORAL_TABLET | Freq: Three times a day (TID) | ORAL | Status: DC
  Administered 2020-08-30 – 2020-09-05 (×16): 100 mg via ORAL
  Filled 2020-08-30 (×17): qty 2

## 2020-08-30 MED ORDER — ACETAMINOPHEN 325 MG PO TABS: 650.0000 mg | ORAL_TABLET | Freq: Four times a day (QID) | ORAL | Status: DC | PRN

## 2020-08-30 MED ORDER — ACETAMINOPHEN 650 MG RE SUPP: 650.0000 mg | Freq: Four times a day (QID) | RECTAL | Status: DC | PRN

## 2020-08-30 MED ORDER — HEPARIN SODIUM (PORCINE) 5000 UNIT/ML IJ SOLN
5000.0000 [IU] | Freq: Three times a day (TID) | INTRAMUSCULAR | Status: DC
  Administered 2020-08-30 – 2020-08-31 (×3): 5000 [IU] via SUBCUTANEOUS
  Filled 2020-08-30 (×3): qty 1

## 2020-08-30 MED ORDER — CALCIUM CARBONATE ANTACID 500 MG PO CHEW
1000.0000 mg | CHEWABLE_TABLET | Freq: Two times a day (BID) | ORAL | Status: DC
  Administered 2020-08-30 – 2020-09-05 (×13): 1000 mg via ORAL
  Filled 2020-08-30 (×13): qty 5

## 2020-08-30 MED ORDER — SALINE SPRAY 0.65 % NA SOLN
1.0000 | NASAL | Status: DC | PRN
  Filled 2020-08-30: qty 44

## 2020-08-30 MED ORDER — SODIUM CHLORIDE 0.9% FLUSH: 10.0000 mL | INTRAVENOUS | Status: DC | PRN

## 2020-08-30 MED ORDER — SODIUM BICARBONATE 650 MG PO TABS
650.0000 mg | ORAL_TABLET | Freq: Three times a day (TID) | ORAL | Status: DC
  Administered 2020-08-30 – 2020-08-31 (×4): 650 mg via ORAL
  Filled 2020-08-30 (×5): qty 1

## 2020-08-30 MED ORDER — CALCIUM CARBONATE 1250 (500 CA) MG PO TABS
1500.0000 mg | ORAL_TABLET | Freq: Three times a day (TID) | ORAL | Status: DC
  Administered 2020-08-31: 1500 mg via ORAL
  Filled 2020-08-30: qty 2

## 2020-08-30 MED ORDER — CALCITRIOL 0.5 MCG PO CAPS
0.5000 ug | ORAL_CAPSULE | Freq: Every day | ORAL | Status: DC
  Administered 2020-08-31 – 2020-09-05 (×6): 0.5 ug via ORAL
  Filled 2020-08-30 (×6): qty 1

## 2020-08-30 MED ORDER — FUROSEMIDE 20 MG PO TABS: 40.0000 mg | ORAL_TABLET | Freq: Every day | ORAL | Status: DC

## 2020-08-30 MED ORDER — FUROSEMIDE 10 MG/ML IJ SOLN
80.0000 mg | Freq: Two times a day (BID) | INTRAMUSCULAR | Status: DC
  Administered 2020-08-31 (×2): 80 mg via INTRAVENOUS
  Filled 2020-08-30 (×2): qty 8

## 2020-08-30 MED ORDER — FUROSEMIDE 10 MG/ML IJ SOLN
80.0000 mg | Freq: Once | INTRAMUSCULAR | Status: AC
  Administered 2020-08-30: 80 mg via INTRAVENOUS
  Filled 2020-08-30 (×2): qty 8

## 2020-08-30 MED ORDER — CALCIUM GLUCONATE-NACL 1-0.675 GM/50ML-% IV SOLN
1.0000 g | Freq: Once | INTRAVENOUS | Status: AC
  Administered 2020-08-31: 1000 mg via INTRAVENOUS
  Filled 2020-08-30: qty 50

## 2020-08-30 MED ORDER — AMLODIPINE BESYLATE 10 MG PO TABS
10.0000 mg | ORAL_TABLET | Freq: Every day | ORAL | Status: DC
  Administered 2020-08-31 – 2020-09-05 (×6): 10 mg via ORAL
  Filled 2020-08-30 (×6): qty 1

## 2020-08-30 MED ORDER — CHLORHEXIDINE GLUCONATE CLOTH 2 % EX PADS
6.0000 | MEDICATED_PAD | Freq: Every day | CUTANEOUS | Status: DC
  Administered 2020-08-31 – 2020-09-03 (×3): 6 via TOPICAL

## 2020-08-30 NOTE — H&P (Signed)
History and Physical    Alicia [name] BJY:[id number] DOB: 02-Sep-1955 DOA: 08/30/2020  PCP: Mindi Curling, PA-C   Patient coming from: Home   Chief Complaint: Worsening renal function on outpatient labs, swelling   HPI: Alicia [name] is a 65 y.o. female with medical history significant for alcoholic liver cirrhosis with ascites, chronic kidney disease stage, hypertension, and metastatic breast cancer, now presenting to emergency department for evaluation of worsening renal function on outpatient blood work.  Patient had a creatinine of 6.3 in March 2022, was having diarrhea approximately a month ago that stopped when she began taking Imodium.  She also had a loss of appetite roughly 1 month ago but that has since improved.  She denies any vomiting.  She has been requiring paracentesis roughly once a month, most recently on 08/22/2020, unsure how much fluid was removed then or whether albumin was given.  She has been strictly avoiding alcohol.  She denies any recent fevers, chills, or shortness of breath, but has noted worsening bilateral lower extremity edema.  She was initially swelling in her feet and ankles bilaterally but over the past several days has had swelling into her upper legs. She saw vascular surgery in April with plan for right basilic vein fistula that has not yet been scheduled.   ED Course: Upon arrival to the ED, patient is found to be afebrile, saturating well on room air, and with stable blood pressure.  EKG features sinus rhythm with QTc interval 501 ms.  Chest x-ray negative for acute cardiopulmonary disease.  Chemistry panel notable for potassium 5.2, normal bicarbonate, BUN 57, creatinine 9.68, albumin 2.8, and calcium 5.7.  Hemoglobin is 7.9 with normal MCV and platelets 149,000.  Nephrology was consulted by the ED physician and the patient was treated with 80 mg IV Lasix, IV calcium, and oral calcium.  Review of Systems:  All other systems reviewed and apart from HPI,  are negative.  Past Medical History:  Diagnosis Date  . Ascites   . Breast cancer ([place])   . Cancer ([place])   . Chronic anticoagulation   . Cirrhosis ([place])   . CKD (chronic kidney disease), stage III ([place])   . Dyspnea    with fluid buildup   . Esophageal varices ([place]) 11/2016   Small noted on ENDO  . GERD (gastroesophageal reflux disease)   . Hypertension   . Hypotension   . Portal hypertension ([facility])     Past Surgical History:  Procedure Laterality Date  . BREAST LUMPECTOMY    . BREAST SURGERY    . CESAREAN SECTION    . COLONOSCOPY WITH PROPOFOL N/A 05/25/2019   Procedure: COLONOSCOPY WITH PROPOFOL;  Surgeon: Wilford Corner, MD;  Location: [facility];  Service: Endoscopy;  Laterality: N/A;  . ESOPHAGOGASTRODUODENOSCOPY (EGD) WITH PROPOFOL N/A 11/21/2016   Procedure: ESOPHAGOGASTRODUODENOSCOPY;  Surgeon: Milus Banister, MD;  Location: [facility];  Service: Endoscopy;  Laterality: N/A;  . ESOPHAGOGASTRODUODENOSCOPY (EGD) WITH PROPOFOL N/A 05/25/2019   Procedure: ESOPHAGOGASTRODUODENOSCOPY (EGD) WITH PROPOFOL;  Surgeon: Wilford Corner, MD;  Location: [facility];  Service: Endoscopy;  Laterality: N/A;  . IR PARACENTESIS  09/04/2016  . IR PARACENTESIS  09/20/2016  . IR PARACENTESIS  10/22/2016  . IR PARACENTESIS  01/27/2017  . IR PARACENTESIS  04/11/2017  . IR PARACENTESIS  06/05/2017  . IR PARACENTESIS  06/05/2017  . IR PARACENTESIS  06/19/2017  . IR PARACENTESIS  07/03/2017  . IR PARACENTESIS  07/17/2017  . IR PARACENTESIS  08/01/2017  . IR PARACENTESIS  08/14/2017  . IR PARACENTESIS  08/28/2017  . IR PARACENTESIS  09/11/2017  . IR PARACENTESIS  09/25/2017  . IR PARACENTESIS  10/08/2017  . IR PARACENTESIS  10/23/2017  . IR PARACENTESIS  11/06/2017  . IR PARACENTESIS  11/20/2017  . IR PARACENTESIS  12/04/2017  . IR PARACENTESIS  12/18/2017  . IR PARACENTESIS  01/19/2018  . IR PARACENTESIS  02/09/2018  . IR PARACENTESIS  02/24/2018  . IR PARACENTESIS  03/16/2018  . IR PARACENTESIS   04/10/2018  . IR PARACENTESIS  05/08/2018  . IR PARACENTESIS  06/11/2018  . IR PARACENTESIS  07/07/2018  . IR PARACENTESIS  09/04/2018  . IR PARACENTESIS  10/22/2018  . IR PARACENTESIS  11/30/2018  . IR PARACENTESIS  01/14/2019  . IR PARACENTESIS  04/30/2019  . IR PARACENTESIS  07/19/2019  . IR RADIOLOGIST EVAL & MGMT  12/23/2017  . LYMPH NODE DISSECTION      Social History:   reports that she has never smoked. She has never used smokeless tobacco. She reports that she does not drink alcohol and does not use drugs.  Allergies  Allergen Reactions  . Compazine [Prochlorperazine Edisylate] Anxiety  . Flexeril [Cyclobenzaprine] Anxiety  . Meclizine Anxiety    Family History  Problem Relation Age of Onset  . Diabetes Mother   . Diabetes Father   . Diabetes Brother   . Colon cancer Neg Hx   . Stomach cancer Neg Hx   . Esophageal cancer Neg Hx      Prior to Admission medications   Medication Sig Start Date End Date Taking? Authorizing Provider  acetaminophen (TYLENOL) 500 MG tablet Take 500 mg by mouth every 6 (six) hours as needed for moderate pain or headache.   Yes [provider]  amLODipine (NORVASC) 10 MG tablet Take 1 tablet by mouth daily. 05/14/20  Yes [provider]  aspirin-acetaminophen-caffeine (EXCEDRIN MIGRAINE) [phone] MG tablet Take 1 tablet by mouth every 6 (six) hours as needed for headache.   Yes [provider]  calcitRIOL (ROCALTROL) 0.5 MCG capsule Take 0.5 mcg by mouth daily.   Yes [provider]  calcium carbonate (OSCAL) 1500 (600 Ca) MG TABS tablet Take 3 tablets by mouth 3 (three) times daily with meals.   Yes [provider]  furosemide (LASIX) 40 MG tablet Take 40 mg by mouth daily. 02/21/20  Yes [provider]  hydrALAZINE (APRESOLINE) 100 MG tablet Take 100 mg by mouth 3 (three) times daily. 05/16/20  Yes [provider]  omeprazole (PRILOSEC) 40 MG capsule Take 1 capsule (40 mg total) by mouth  daily. Patient taking differently: Take 40 mg by mouth daily as needed (indigestion). 08/08/16  Yes Barton Dubois, MD  sodium bicarbonate 650 MG tablet Take 650 mg by mouth 3 (three) times daily. 02/22/20  Yes [provider]  sodium chloride (OCEAN) 0.65 % SOLN nasal spray Place 1 spray into both nostrils as needed for congestion.   Yes [provider]  spironolactone (ALDACTONE) 25 MG tablet Take 1 tablet by mouth daily. 08/29/20  Yes [provider]  spironolactone (ALDACTONE) 50 MG tablet Take 1.5 tablets (75 mg total) by mouth 2 (two) times daily. 12/09/17 05/25/19  Milus Banister, MD    Physical Exam: Vitals:   08/30/20 1603 08/30/20 1755 08/30/20 1935  BP: (!) 152/80 (!) 154/95 (!) 176/100  Pulse: 96 96 97  Resp: 16 16 (!) 23  Temp: 98.4 F (36.9 C)    TempSrc: Oral  SpO2: 99% 100% 100%    Constitutional: NAD, calm  Eyes: PERTLA, lids and conjunctivae normal ENMT: Mucous membranes are moist. Posterior pharynx clear of any exudate or lesions.   Neck: normal, supple, no masses, no thyromegaly Respiratory: no wheezing, no crackles. No accessory muscle use.  Cardiovascular: S1 & S2 heard, regular rate and rhythm. Bilateral LE pitting edema into thighs.  Abdomen: Distended, soft, no tenderness. Bowel sounds active.  Musculoskeletal: no clubbing / cyanosis. No joint deformity upper and lower extremities.   Skin: no significant rashes, lesions, ulcers. Warm, dry, well-perfused. Neurologic: CN 2-12 grossly intact. Sensation intact. Moving all extremities.  Psychiatric: Alert and oriented to person, place, and situation. Pleasant and cooperative.    Labs and Imaging on Admission: I have personally reviewed following labs and imaging studies  CBC: Recent Labs  Lab 08/30/20 1544  WBC 5.2  NEUTROABS 4.1  HGB 7.9*  HCT 26.8*  MCV 89.3  PLT 026*   Basic Metabolic Panel: Recent Labs  Lab 08/30/20 1544  NA 134*  K 5.2*  CL 102  CO2 22  GLUCOSE  108*  BUN 57*  CREATININE 9.68*  CALCIUM 5.7*   GFR: CrCl cannot be calculated (Unknown ideal weight.). Liver Function Tests: Recent Labs  Lab 08/30/20 1544  AST 27  ALT 16  ALKPHOS 89  BILITOT 0.8  PROT 6.5  ALBUMIN 2.8*   No results for input(s): LIPASE, AMYLASE in the last 168 hours. No results for input(s): AMMONIA in the last 168 hours. Coagulation Profile: No results for input(s): INR, PROTIME in the last 168 hours. Cardiac Enzymes: No results for input(s): CKTOTAL, CKMB, CKMBINDEX, TROPONINI in the last 168 hours. BNP (last 3 results) No results for input(s): PROBNP in the last 8760 hours. HbA1C: No results for input(s): HGBA1C in the last 72 hours. CBG: No results for input(s): GLUCAP in the last 168 hours. Lipid Profile: No results for input(s): CHOL, HDL, LDLCALC, TRIG, CHOLHDL, LDLDIRECT in the last 72 hours. Thyroid Function Tests: No results for input(s): TSH, T4TOTAL, FREET4, T3FREE, THYROIDAB in the last 72 hours. Anemia Panel: No results for input(s): VITAMINB12, FOLATE, FERRITIN, TIBC, IRON, RETICCTPCT in the last 72 hours. Urine analysis: No results found for: COLORURINE, APPEARANCEUR, LABSPEC, PHURINE, GLUCOSEU, HGBUR, BILIRUBINUR, KETONESUR, PROTEINUR, UROBILINOGEN, NITRITE, LEUKOCYTESUR Sepsis Labs: @LABRCNTIP (procalcitonin:4,lacticidven:4) )No results found for this or any previous visit (from the past 240 hour(s)).   Radiological Exams on Admission: DG Chest 2 View  Result Date: 08/30/2020 CLINICAL DATA:  Fluid overload and peripheral swelling, initial encounter EXAM: CHEST - 2 VIEW COMPARISON:  None. FINDINGS: Cardiac shadow is within normal limits. The lungs are well aerated bilaterally. Right hemidiaphragm is elevated. Right-sided chest port is noted with catheter tip at cavoatrial junction. No acute bony abnormality is noted. IMPRESSION: No active cardiopulmonary disease. Electronically Signed   By: Inez Catalina M.D.   On: 08/30/2020 16:19     EKG: Independently reviewed. Sinus rhythm, QTc 501 ms.   Assessment/Plan   1. Acute kidney injury superimposed on CKD V  - Patient with CKD and SCr 6.3 in early March sent to ED by PCP for worsening renal function and found to have BUN 57, SCr 9.68, potassium 5.2, calcium 5.7 (albumin 2.8), and peripheral edema  - Nephrology consulted by ED, recommending hospitalist admission with renal US, Lasix, and calcium    2. Cirrhosis  - Has been requiring paracentesis ~monthly, last done on 5/17  - Hold Aldactone in light of hyperkalemia, continue alcohol avoidance  3. Breast cancer  - Metastatic, follows with oncology at [facility] in [place] and had been on Taxol until November, treatment now paused due to worsening renal function  - Continue oncology follow-up after discharge    4. Prolonged QT interval  - QTc is 501 ms in ED  - Minimize QT-prolonging medications, supplement calcium, check magnesium level    5. Hypertension  - Continue hydralazine and Norvasc    6. Anemia  - Hgb is 7.9 on admission  - No overt bleeding, likely related to CKD  - Check anemia panel, monitor, transfuse if needed    DVT prophylaxis: sq heparin  Code Status: Full  Level of Care: Level of care: Telemetry Medical Family Communication: None present  Disposition Plan:  Patient is from: Home  Anticipated d/c is to: TBD Anticipated d/c date is: 09/03/20 Patient currently: Pending nephrology consult, likely will need to start dialysis this admission  Consults called: Nephrology Admission status: Inpatient     Vianne Bulls, MD [facility]  08/30/2020, 8:15 PM

## 2020-08-30 NOTE — Consult Note (Signed)
Reason for Consult: Acute kidney injury on chronic kidney disease stage V, volume overload Referring Physician: Mitzi Hansen [name] [facility])  HPI:  65 year old unfortunate African-American woman with past medical history significant for recurrent metastatic breast cancer, hypertension, alcoholic cirrhosis with recurrent ascites needing paracentesis every 2 weeks, portal hypertension, anemia of chronic disease, gastroesophageal reflux disease and chronic kidney disease stage V (creatinine 5.6-6.3 at baseline).  She was referred to the emergency room by her primary care provider Alicia [name], [place], Alicia [name]) who obtained labs yesterday that showed an acute rise of her creatinine to 9.6 with hyperkalemia of 5.6, hypocalcemia of 6.0 and metabolic acidosis with a bicarbonate of 18.  She complains of progressively worsening lower extremity edema, increasing abdominal distention in spite of 4.3 L paracentesis last week and easy fatigability with some cramping of the lower extremities.  She denies any nausea or vomiting but reports poor appetite with some GERD type symptoms.  She denies any chest pain or shortness of breath at rest and denies orthopnea.  She denies any dysuria, urgency, frequency, flank pain, fever, chills or hematuria.  She is supposed to have been taking furosemide 60 mg daily along with spironolactone 25 mg daily.  She has had discussions with Alicia [name] (her outpatient nephrologist at [facility]) and has made the decision to pursue renal replacement therapy.  She was seen last month by Alicia [name] of vascular surgery for dialysis access planning and was found to have marginal veins with a potential target for right BBF which she declined to schedule at the time.  We had a lengthy discussion today regarding her renal function and the possibility that she may need to initiate dialysis of which she is accepting.  Past Medical History:  Diagnosis Date  . Ascites   . Breast cancer ([place])    . Cancer ([place])   . Chronic anticoagulation   . Cirrhosis ([place])   . CKD (chronic kidney disease), stage III ([place])   . Dyspnea    with fluid buildup   . Esophageal varices ([place]) 11/2016   Small noted on ENDO  . GERD (gastroesophageal reflux disease)   . Hypertension   . Hypotension   . Portal hypertension ([facility])     Past Surgical History:  Procedure Laterality Date  . BREAST LUMPECTOMY    . BREAST SURGERY    . CESAREAN SECTION    . COLONOSCOPY WITH PROPOFOL N/A 05/25/2019   Procedure: COLONOSCOPY WITH PROPOFOL;  Surgeon: Wilford Corner, [name];  [facility];  Service: Endoscopy;  Laterality: N/A;  . ESOPHAGOGASTRODUODENOSCOPY (EGD) WITH PROPOFOL N/A 11/21/2016   Procedure: ESOPHAGOGASTRODUODENOSCOPY;  Surgeon: Milus Banister, [name];  [facility];  Service: Endoscopy;  Laterality: N/A;  . ESOPHAGOGASTRODUODENOSCOPY (EGD) WITH PROPOFOL N/A 05/25/2019   Procedure: ESOPHAGOGASTRODUODENOSCOPY (EGD) WITH PROPOFOL;  Surgeon: Wilford Corner, [name];  [facility];  Service: Endoscopy;  Laterality: N/A;  . IR PARACENTESIS  09/04/2016  . IR PARACENTESIS  09/20/2016  . IR PARACENTESIS  10/22/2016  . IR PARACENTESIS  01/27/2017  . IR PARACENTESIS  04/11/2017  . IR PARACENTESIS  06/05/2017  . IR PARACENTESIS  06/05/2017  . IR PARACENTESIS  06/19/2017  . IR PARACENTESIS  07/03/2017  . IR PARACENTESIS  07/17/2017  . IR PARACENTESIS  08/01/2017  . IR PARACENTESIS  08/14/2017  . IR PARACENTESIS  08/28/2017  . IR PARACENTESIS  09/11/2017  . IR PARACENTESIS  09/25/2017  . IR PARACENTESIS  10/08/2017  . IR PARACENTESIS  10/23/2017  . IR PARACENTESIS  11/06/2017  . IR PARACENTESIS  11/20/2017  . IR PARACENTESIS  12/04/2017  . IR PARACENTESIS  12/18/2017  . IR PARACENTESIS  01/19/2018  . IR PARACENTESIS  02/09/2018  . IR PARACENTESIS  02/24/2018  . IR PARACENTESIS  03/16/2018  . IR PARACENTESIS  04/10/2018  . IR PARACENTESIS  05/08/2018  . IR PARACENTESIS  06/11/2018  . IR PARACENTESIS   07/07/2018  . IR PARACENTESIS  09/04/2018  . IR PARACENTESIS  10/22/2018  . IR PARACENTESIS  11/30/2018  . IR PARACENTESIS  01/14/2019  . IR PARACENTESIS  04/30/2019  . IR PARACENTESIS  07/19/2019  . IR RADIOLOGIST EVAL & MGMT  12/23/2017  . LYMPH NODE DISSECTION      Family History  Problem Relation Age of Onset  . Diabetes Mother   . Diabetes Father   . Diabetes Brother   . Colon cancer Neg Hx   . Stomach cancer Neg Hx   . Esophageal cancer Neg Hx     Social History:  reports that she has never smoked. She has never used smokeless tobacco. She reports that she does not drink alcohol and does not use drugs.  Allergies:  Allergies  Allergen Reactions  . Compazine [Prochlorperazine Edisylate] Anxiety  . Flexeril [Cyclobenzaprine] Anxiety  . Meclizine Anxiety    Medications:  Scheduled: . [START ON 08/31/2020] amLODipine  10 mg Oral Daily  . [START ON 08/31/2020] calcitRIOL  0.5 mcg Oral Daily  . [START ON 08/31/2020] calcium carbonate  3 tablet Oral TID WC  . calcium carbonate  1,000 mg Oral BID  . furosemide  80 mg Intravenous Once  . [START ON 08/31/2020] furosemide  40 mg Oral Daily  . heparin  5,000 Units Subcutaneous Q8H  . hydrALAZINE  100 mg Oral TID  . sodium bicarbonate  650 mg Oral TID   BMP Latest Ref Rng & Units 08/30/2020 12/30/2018 11/28/2017  Glucose 70 - 99 mg/dL 108(H) 100(H) 103(H)  BUN 8 - 23 mg/dL 57(H) 26(H) 28(H)  Creatinine 0.44 - 1.00 mg/dL 9.68(H) 2.23(H) 1.49(H)  Sodium 135 - 145 mmol/L 134(L) 135 138  Potassium 3.5 - 5.1 mmol/L 5.2(H) 4.3 4.1  Chloride 98 - 111 mmol/L 102 105 108  CO2 22 - 32 mmol/L 22 16(L) 22  Calcium 8.9 - 10.3 mg/dL 5.7(LL) 7.8(L) 8.7   CBC Latest Ref Rng & Units 08/30/2020 03/23/2020 03/09/2020  WBC 4.0 - 10.5 K/uL 5.2 - -  Hemoglobin 12.0 - 15.0 g/dL 7.9(L) 8.3(L) 8.8(L)  Hematocrit 36.0 - 46.0 % 26.8(L) - -  Platelets 150 - 400 K/uL 149(L) - -     DG Chest 2 View  Result Date: 08/30/2020 CLINICAL DATA:  Fluid overload and  peripheral swelling, initial encounter EXAM: CHEST - 2 VIEW COMPARISON:  None. FINDINGS: Cardiac shadow is within normal limits. The lungs are well aerated bilaterally. Right hemidiaphragm is elevated. Right-sided chest port is noted with catheter tip at cavoatrial junction. No acute bony abnormality is noted. IMPRESSION: No active cardiopulmonary disease. Electronically Signed   By: Inez Catalina M.D.   On: 08/30/2020 16:19    Review of Systems  Constitutional: Positive for appetite change, chills and fatigue. Negative for fever.  HENT: Negative for ear pain, hearing loss, nosebleeds and sinus pressure.   Eyes: Negative for pain, redness and visual disturbance.  Respiratory: Negative for cough, chest tightness and shortness of breath.   Cardiovascular: Positive for leg swelling. Negative for chest pain.  Gastrointestinal: Positive for abdominal distention. Negative for abdominal pain, anal  bleeding, blood in stool, diarrhea, nausea and vomiting.  Endocrine: Positive for cold intolerance.  Genitourinary: Negative for dysuria, flank pain, frequency and hematuria.  Musculoskeletal: Positive for back pain and myalgias.  Skin: Negative for pallor and rash.  Neurological: Positive for weakness. Negative for tremors, light-headedness and headaches.  Psychiatric/Behavioral: Negative for confusion and hallucinations.   Blood pressure (!) 176/100, pulse 97, temperature 98.4 F (36.9 C), temperature source Oral, resp. rate (!) 23, SpO2 100 %. Physical Exam Vitals and nursing note reviewed.  Constitutional:      Appearance: Normal appearance. She is ill-appearing.  HENT:     Head: Normocephalic and atraumatic.     Right Ear: External ear normal.     Left Ear: External ear normal.     Nose: Nose normal. No congestion.     Mouth/Throat:     Mouth: Mucous membranes are dry.     Pharynx: Oropharynx is clear.  Eyes:     General: No scleral icterus.    Pupils: Pupils are equal, round, and reactive to  light.     Comments: Conjunctivae pale  Neck:     Comments: 11 cm JVP Cardiovascular:     Rate and Rhythm: Normal rate and regular rhythm.     Heart sounds: Murmur heard.      Comments: 3/6 ejection systolic murmur Pulmonary:     Effort: Pulmonary effort is normal.     Breath sounds: No wheezing or rales.     Comments: Decreased breath sounds over bases Abdominal:     General: There is distension.     Comments: Moderate to severe global distention, firm to palpation without tenderness  Musculoskeletal:     Cervical back: Normal range of motion and neck supple.     Right lower leg: Edema present.     Left lower leg: Edema present.     Comments: 3+ pitting bilateral lower extremity edema  Skin:    General: Skin is warm and dry.     Coloration: Skin is pale.  Neurological:     General: No focal deficit present.     Mental Status: She is alert and oriented to person, place, and time.  Psychiatric:        Mood and Affect: Mood normal.    Assessment/Plan: 1.  Acute kidney injury on chronic kidney disease stage V: Suspect that this might be hemodynamically mediated acute kidney injury in the setting of significantly elevated blood pressure and third spacing in the setting of cirrhosis.  The initial attempt will be made to undertake diuresis with intravenous furosemide for attempts at volume unloading and if this is unsuccessful, she will need initiation of hemodialysis via a tunneled hemodialysis catheter with subsequent placement of permanent access.  I have made her aware that this might be likely an indication for chronic dialysis dependency as she would not be a candidate for transplant with recurrent metastatic cancer.  I will send off for urinalysis and urine electrolytes and obtain a renal ultrasound to verify that she does not have any obstruction from pelvic adenopathy. Avoid nephrotoxic medications including NSAIDs and iodinated intravenous contrast exposure unless the latter is  absolutely indicated.  Preferred narcotic agents for pain control are hydromorphone, fentanyl, and methadone. Morphine should not be used. Avoid Baclofen and avoid oral sodium phosphate and magnesium citrate based laxatives / bowel preps. Continue strict Input and Output monitoring. Will monitor the patient closely with you and intervene or adjust therapy as indicated by changes in clinical status/labs.  2.  Hyperkalemia: Mild, likely exacerbated by worsening of renal insufficiency.  Discontinue spironolactone indefinitely at this time and attempt diuresis with intravenous furosemide. 3.  Hypocalcemia: Corrected calcium remains depressed at around 6.6.  I instructed the emergency room earlier to give her 2 g of intravenous calcium gluconate and to start her on oral calcium supplementation.  We will check PTH and phosphorus with a.m. labs. 4.  Non-anion gap metabolic acidosis: Secondary to progressive chronic kidney disease, will monitor on oral sodium bicarbonate at this time. 5.  Anemia of chronic disease: No evidence of overt loss although this remains under close monitoring with history of portal hypertension and esophageal varices.  Will check iron studies and decide on need for supplementation versus ESA.   Alicia [name] K. 08/30/2020, 8:47 PM

## 2020-08-30 NOTE — ED Triage Notes (Signed)
Patient sent by her MD for worsening renal labs and sent for possible emergent dialysis. Patient denies SOB/ no CP. Patient complains of 2 days of ankle and foot swelling. Patient has cirrhosis and [place]

## 2020-08-30 NOTE — ED Notes (Signed)
This RN attempted to give report several times to 5N, attempt to call 5N charge without answer. ED Charge RN made aware, this RN to give bedside report.

## 2020-08-30 NOTE — ED Provider Notes (Signed)
Emergency Medicine Provider Triage Evaluation Note  Alicia [name] , a 65 y.o. female  was evaluated in triage.  Pt complains of abnormal labs. Pt states she has been feeling well but had labs done and her creatinine was elevated  Review of Systems  Positive: n/a Negative: Fevers, chest pain  Physical Exam  There were no vitals taken for this visit. Gen:   Awake, no distress   Resp:  Normal effort  MSK:   Moves extremities without difficulty   Medical Decision Making  Medically screening exam initiated at 3:42 PM.  Appropriate orders placed.  Alicia [name] was informed that the remainder of the evaluation will be completed by another provider, this initial triage assessment does not replace that evaluation, and the importance of remaining in the ED until their evaluation is complete.   Alicia [name] 08/30/20 1544    Isla Pence, MD 08/31/20 1729

## 2020-08-30 NOTE — ED Provider Notes (Signed)
[facility] EMERGENCY DEPARTMENT Provider Note   CSN: [id number] Arrival date & time: 08/30/20  1538     History Chief Complaint  Patient presents with  . Abnormal Lab    Alicia [name] is a 65 y.o. female.  HPI   Patient with significant medical history of metastatic breast cancer currently not on chemotherapy, liver cirrhosis, CKD stage IV, hypertension, presents to the emergency department with chief complaint of abnormal lab work.  Patient states she was seen at her primary care office today and they notified her that her creatinine was extremely elevated and told to come here for further evaluation.  Patient tells me that over the last 2 days she has developed worsening pedal edema as well as worsening orthopnea, states she needs to sleep with pillows behind her, she denies actual chest pain or shortness of breath, denies nausea, vomiting, abdominal pain.  Patient states she still is able to urinate, she denies any urinary symptoms at this time.  Patient states she is aware that  has kidney disease and she thinks it is from the medication that she has been taking, it is noted that she has high blood pressure and she is not well controlled.  Patient denies alleviating factors.  Patient denies headaches, fevers, chills, shortness of breath, chest pain, abdominal pain, nausea, vomiting, diarrhea.   Past Medical History:  Diagnosis Date  . Ascites   . Breast cancer ([place])   . Cancer ([place])   . Chronic anticoagulation   . Cirrhosis ([place])   . CKD (chronic kidney disease), stage III ([place])   . Dyspnea    with fluid buildup   . Esophageal varices ([place]) 11/2016   Small noted on ENDO  . GERD (gastroesophageal reflux disease)   . Hypertension   . Hypotension   . Portal hypertension [facility])     Patient Active Problem List   Diagnosis Date Noted  . Renal failure 08/30/2020  . Normocytic anemia 08/30/2020  . Hyperkalemia 08/30/2020  . Metastatic breast cancer ([place])  10/01/2019  . Focal lymphadenopathy 08/31/2019  . Lymphadenopathy, axillary 08/31/2019  . Mixed hyperlipidemia 05/28/2019  . Special screening for malignant neoplasms, colon 05/25/2019  . Hyperphosphatemia 04/23/2019  . Hypocalcemia 04/20/2019  . History of breast cancer 04/05/2019  . Rectus sheath hematoma 11/07/2017  . Abdominal wall hematoma 11/06/2017  . Other cirrhosis of liver ([place])   . Secondary esophageal varices without bleeding ([place])   . Alcoholic cirrhosis of liver with ascites ([place]) 08/08/2016  . HTN (hypertension) 08/08/2016  . Anemia in other chronic diseases classified elsewhere 08/08/2016  . Stage 3 chronic kidney disease ([place]) 08/08/2016  . Hypokalemia 08/08/2016  . Gastroesophageal reflux disease   . Portal hypertension ([place])   . Ascites 08/07/2016    Past Surgical History:  Procedure Laterality Date  . BREAST LUMPECTOMY    . BREAST SURGERY    . CESAREAN SECTION    . COLONOSCOPY WITH PROPOFOL N/A 05/25/2019   Procedure: COLONOSCOPY WITH PROPOFOL;  Surgeon: Wilford Corner, MD;  Location: [facility] ENDOSCOPY;  Service: Endoscopy;  Laterality: N/A;  . ESOPHAGOGASTRODUODENOSCOPY (EGD) WITH PROPOFOL N/A 11/21/2016   Procedure: ESOPHAGOGASTRODUODENOSCOPY;  Surgeon: Milus Banister, MD;  Location[facility];  Service: Endoscopy;  Laterality: N/A;  . ESOPHAGOGASTRODUODENOSCOPY (EGD) WITH PROPOFOL N/A 05/25/2019   Procedure: ESOPHAGOGASTRODUODENOSCOPY (EGD) WITH PROPOFOL;  Surgeon: Wilford Corner, MD;  [facility];  Service: Endoscopy;  Laterality: N/A;  . IR PARACENTESIS  09/04/2016  . IR PARACENTESIS  09/20/2016  .  IR PARACENTESIS  10/22/2016  . IR PARACENTESIS  01/27/2017  . IR PARACENTESIS  04/11/2017  . IR PARACENTESIS  06/05/2017  . IR PARACENTESIS  06/05/2017  . IR PARACENTESIS  06/19/2017  . IR PARACENTESIS  07/03/2017  . IR PARACENTESIS  07/17/2017  . IR PARACENTESIS  08/01/2017  . IR PARACENTESIS  08/14/2017  . IR PARACENTESIS  08/28/2017  . IR  PARACENTESIS  09/11/2017  . IR PARACENTESIS  09/25/2017  . IR PARACENTESIS  10/08/2017  . IR PARACENTESIS  10/23/2017  . IR PARACENTESIS  11/06/2017  . IR PARACENTESIS  11/20/2017  . IR PARACENTESIS  12/04/2017  . IR PARACENTESIS  12/18/2017  . IR PARACENTESIS  01/19/2018  . IR PARACENTESIS  02/09/2018  . IR PARACENTESIS  02/24/2018  . IR PARACENTESIS  03/16/2018  . IR PARACENTESIS  04/10/2018  . IR PARACENTESIS  05/08/2018  . IR PARACENTESIS  06/11/2018  . IR PARACENTESIS  07/07/2018  . IR PARACENTESIS  09/04/2018  . IR PARACENTESIS  10/22/2018  . IR PARACENTESIS  11/30/2018  . IR PARACENTESIS  01/14/2019  . IR PARACENTESIS  04/30/2019  . IR PARACENTESIS  07/19/2019  . IR RADIOLOGIST EVAL & MGMT  12/23/2017  . LYMPH NODE DISSECTION       OB History   No obstetric history on file.     Family History  Problem Relation Age of Onset  . Diabetes Mother   . Diabetes Father   . Diabetes Brother   . Colon cancer Neg Hx   . Stomach cancer Neg Hx   . Esophageal cancer Neg Hx     Social History   Tobacco Use  . Smoking status: Never Smoker  . Smokeless tobacco: Never Used  Vaping Use  . Vaping Use: Never used  Substance Use Topics  . Alcohol use: No    Comment: occassional wine   . Drug use: No    Home Medications Prior to Admission medications   Medication Sig Start Date End Date Taking? Authorizing Provider  acetaminophen (TYLENOL) 500 MG tablet Take 500 mg by mouth every 6 (six) hours as needed for moderate pain or headache.   Yes [provider]  amLODipine (NORVASC) 10 MG tablet Take 1 tablet by mouth daily. 05/14/20  Yes [provider]  aspirin-acetaminophen-caffeine (EXCEDRIN MIGRAINE) [phone] MG tablet Take 1 tablet by mouth every 6 (six) hours as needed for headache.   Yes [provider]  calcitRIOL (ROCALTROL) 0.5 MCG capsule Take 0.5 mcg by mouth daily.   Yes [provider]  calcium carbonate (OSCAL) 1500 (600 Ca) MG TABS tablet Take 3  tablets by mouth 3 (three) times daily with meals.   Yes [provider]  furosemide (LASIX) 40 MG tablet Take 40 mg by mouth daily. 02/21/20  Yes [provider]  hydrALAZINE (APRESOLINE) 100 MG tablet Take 100 mg by mouth 3 (three) times daily. 05/16/20  Yes [provider]  omeprazole (PRILOSEC) 40 MG capsule Take 1 capsule (40 mg total) by mouth daily. Patient taking differently: Take 40 mg by mouth daily as needed (indigestion). 08/08/16  Yes Barton Dubois, MD  sodium bicarbonate 650 MG tablet Take 650 mg by mouth 3 (three) times daily. 02/22/20  Yes [provider]  sodium chloride (OCEAN) 0.65 % SOLN nasal spray Place 1 spray into both nostrils as needed for congestion.   Yes [provider]  spironolactone (ALDACTONE) 25 MG tablet Take 1 tablet by mouth daily. 08/29/20  Yes [provider]  spironolactone (ALDACTONE) 50 MG tablet Take 1.5 tablets (75 mg total) by mouth 2 (two) times daily. 12/09/17 05/25/19  Milus Banister, MD    Allergies    Compazine [prochlorperazine edisylate], Flexeril [cyclobenzaprine], and Meclizine  Review of Systems   Review of Systems  Constitutional: Negative for chills and fever.  HENT: Negative for congestion and sore throat.   Respiratory: Negative for shortness of breath.   Cardiovascular: Positive for leg swelling. Negative for chest pain.  Gastrointestinal: Negative for abdominal pain, diarrhea, nausea and vomiting.  Genitourinary: Negative for dysuria and enuresis.  Musculoskeletal: Negative for back pain.  Skin: Negative for rash.  Neurological: Negative for dizziness.  Hematological: Does not bruise/bleed easily.    Physical Exam Updated Vital Signs BP (!) 176/100   Pulse 97   Temp 98.4 F (36.9 C) (Oral)   Resp (!) 23   SpO2 100%   Physical Exam Vitals and nursing note reviewed.  Constitutional:      General: She is not in acute distress.    Appearance: She is not ill-appearing.   HENT:     Head: Normocephalic and atraumatic.     Nose: No congestion.  Eyes:     Conjunctiva/sclera: Conjunctivae normal.  Cardiovascular:     Rate and Rhythm: Normal rate and regular rhythm.     Pulses: Normal pulses.     Heart sounds: No murmur heard. No friction rub. No gallop.   Pulmonary:     Effort: No respiratory distress.     Breath sounds: No wheezing, rhonchi or rales.  Abdominal:     General: There is distension.     Palpations: Abdomen is soft.     Tenderness: There is no right CVA tenderness or left CVA tenderness.     Comments: Patient's abdomen is distended, dull to percussion, normoactive bowel sounds, she has a positive fluid wave, she is nontender to palpation, she does have a noted ventral hernia.  Musculoskeletal:     Right lower leg: Edema present.     Left lower leg: Edema present.     Comments: Patient has noted 2+ pitting edema up into the mid thighs, bilaterally, neurovascular fully intact.  Skin:    General: Skin is warm and dry.  Neurological:     Mental Status: She is alert.  Psychiatric:        Mood and Affect: Mood normal.     ED Results / Procedures / Treatments   Labs (all labs ordered are listed, but only abnormal results are displayed) Labs Reviewed  COMPREHENSIVE METABOLIC PANEL - Abnormal; Notable for the following components:      Result Value   Sodium 134 (*)    Potassium 5.2 (*)    Glucose, Bld 108 (*)    BUN 57 (*)    Creatinine, Ser 9.68 (*)    Calcium 5.7 (*)    Albumin 2.8 (*)    GFR, Estimated 4 (*)    All other components within normal limits  CBC WITH DIFFERENTIAL/PLATELET - Abnormal; Notable for the following components:   RBC 3.00 (*)    Hemoglobin 7.9 (*)    HCT 26.8 (*)    MCHC 29.5 (*)    Platelets 149 (*)    Lymphs Abs 0.6 (*)    All other components within normal limits  RESP PANEL BY RT-PCR (FLU A&B, COVID) ARPGX2  BRAIN NATRIURETIC PEPTIDE  MAGNESIUM  URINALYSIS, ROUTINE W REFLEX MICROSCOPIC   MICROALBUMIN / CREATININE URINE RATIO  SODIUM, URINE, RANDOM  CREATININE, URINE, RANDOM    EKG EKG Interpretation  Date/Time:  Wednesday Aug 30 2020 17:03:48 EDT Ventricular Rate:  90 PR Interval:  154 QRS Duration: 62 QT Interval:  410 QTC Calculation: 501 R Axis:   46 Text Interpretation: Normal sinus rhythm Septal infarct , age undetermined Abnormal ECG Confirmed by Lacretia Leigh ([id number]) on 08/30/2020 6:56:15 PM   Radiology DG Chest 2 View  Result Date: 08/30/2020 CLINICAL DATA:  Fluid overload and peripheral swelling, initial encounter EXAM: CHEST - 2 VIEW COMPARISON:  None. FINDINGS: Cardiac shadow is within normal limits. The lungs are well aerated bilaterally. Right hemidiaphragm is elevated. Right-sided chest port is noted with catheter tip at cavoatrial junction. No acute bony abnormality is noted. IMPRESSION: No active cardiopulmonary disease. Electronically Signed   By: Inez Catalina M.D.   On: 08/30/2020 16:19    Procedures Procedures   Medications Ordered in ED Medications  calcium gluconate 1 g/ 50 mL sodium chloride IVPB (has no administration in time range)  furosemide (LASIX) injection 80 mg (has no administration in time range)  calcium carbonate (TUMS - dosed in mg elemental calcium) chewable tablet 1,000 mg (has no administration in time range)    ED Course  I have reviewed the triage vital signs and the nursing notes.  Pertinent labs & imaging results that were available during my care of the patient were reviewed by me and considered in my medical decision making (see chart for details).    MDM Rules/Calculators/A&P                         Initial impression-patient presents with abnormal lab work.  She is alert, does not appear in acute distress, vital signs are all for hypertension.  Triage obtain basic lab work-up.  Will add on BNP, magnesium, UA  Work-up-CBC shows normocytic anemia with hemoglobin of 7.9 appears to be baseline for patient, CMP  shows hyponatremia of 134, hyperkalemia 5.2, hyperglycemia of 108, elevated BUN of 57, creatinine 9.68 baseline appears to be around 2, hypoalbuminemia 2.8.  Chest x-ray negative for acute findings, EKG is sinus without signs of ischemia, does show slightly prolongated QT's.  Consult-  1. due to severely increased creatinine will speak with nephrology for further recommendations.  Spoke with Dr. Posey Pronto he does not feel patient needs emergent dialysis at this time, he recommends starting patient on calcium, Lasix, renal ultrasound and admission to medicine.  He will consult later today.  2. Spoke to dr oypd of the hospitalist team, he has accepted patient and will come down evaluate.  Reassessment -updated patient on recommendation from nephrology, patient is agreeable to this, will consult with hospitalist team for admission.  Rule out-I have low suspicion for emergent dialysis at this time as patient has no new oxygen requirements, no pulmonary edema seen on chest x-ray, no severe electrolyte derangements, no arrhythmias present EKG.  Low suspicion for ACS as patient denies chest pain, shortness of breath, EKG without signs of ischemia.  Low suspicion for systemic infection as patient is nontoxic-appearing, vital signs reassuring, no obvious sources of infection present my exam.  Plan-admit to medicine due to worsening AKI and volume overloaded.  Patient will be turned over to admitting team.   Final Clinical Impression(s) / ED Diagnoses Final diagnoses:  Anasarca associated with disorder of kidney  AKI (acute kidney injury) ([place])  Hyperkalemia  Hypocalcemia    Rx / DC Orders ED Discharge Orders    None  Marcello Fennel, PA-C 08/30/20 1948    Lacretia Leigh, MD 09/01/20 [phone]

## 2020-08-30 NOTE — ED Notes (Signed)
Patient transported to Ultrasound 

## 2020-08-31 DIAGNOSIS — N185 Chronic kidney disease, stage 5: Secondary | ICD-10-CM

## 2020-08-31 DIAGNOSIS — N179 Acute kidney failure, unspecified: Secondary | ICD-10-CM

## 2020-08-31 LAB — HEMOGLOBIN AND HEMATOCRIT, BLOOD
HCT: 23.9 % — ABNORMAL LOW (ref 36.0–46.0)
Hemoglobin: 7.5 g/dL — ABNORMAL LOW (ref 12.0–15.0)

## 2020-08-31 LAB — RENAL FUNCTION PANEL
Albumin: 2.3 g/dL — ABNORMAL LOW (ref 3.5–5.0)
Anion gap: 12 (ref 5–15)
BUN: 56 mg/dL — ABNORMAL HIGH (ref 8–23)
CO2: 20 mmol/L — ABNORMAL LOW (ref 22–32)
Calcium: 5.8 mg/dL — CL (ref 8.9–10.3)
Chloride: 102 mmol/L (ref 98–111)
Creatinine, Ser: 9.34 mg/dL — ABNORMAL HIGH (ref 0.44–1.00)
GFR, Estimated: 4 mL/min — ABNORMAL LOW
Glucose, Bld: 126 mg/dL — ABNORMAL HIGH (ref 70–99)
Phosphorus: 8.6 mg/dL — ABNORMAL HIGH (ref 2.5–4.6)
Potassium: 5.3 mmol/L — ABNORMAL HIGH (ref 3.5–5.1)
Sodium: 134 mmol/L — ABNORMAL LOW (ref 135–145)

## 2020-08-31 LAB — IRON AND TIBC
Iron: 32 ug/dL (ref 28–170)
Saturation Ratios: 16 % (ref 10.4–31.8)
TIBC: 204 ug/dL — ABNORMAL LOW (ref 250–450)
UIBC: 172 ug/dL

## 2020-08-31 LAB — CBC
HCT: 21.3 % — ABNORMAL LOW (ref 36.0–46.0)
Hemoglobin: 6.6 g/dL — CL (ref 12.0–15.0)
MCH: 26.8 pg (ref 26.0–34.0)
MCHC: 31 g/dL (ref 30.0–36.0)
MCV: 86.6 fL (ref 80.0–100.0)
Platelets: 122 K/uL — ABNORMAL LOW (ref 150–400)
RBC: 2.46 MIL/uL — ABNORMAL LOW (ref 3.87–5.11)
RDW: 14.2 % (ref 11.5–15.5)
WBC: 4.2 K/uL (ref 4.0–10.5)
nRBC: 0 % (ref 0.0–0.2)

## 2020-08-31 LAB — URINALYSIS, ROUTINE W REFLEX MICROSCOPIC
Bilirubin Urine: NEGATIVE
Glucose, UA: NEGATIVE mg/dL
Hgb urine dipstick: NEGATIVE
Ketones, ur: NEGATIVE mg/dL
Leukocytes,Ua: NEGATIVE
Nitrite: NEGATIVE
Protein, ur: NEGATIVE mg/dL
Specific Gravity, Urine: 1.006 (ref 1.005–1.030)
pH: 7 (ref 5.0–8.0)

## 2020-08-31 LAB — CREATININE, URINE, RANDOM: Creatinine, Urine: 50.9 mg/dL

## 2020-08-31 LAB — RETICULOCYTES
Immature Retic Fract: 17.1 % — ABNORMAL HIGH (ref 2.3–15.9)
RBC.: 2.39 MIL/uL — ABNORMAL LOW (ref 3.87–5.11)
Retic Count, Absolute: 23.7 K/uL (ref 19.0–186.0)
Retic Ct Pct: 1 % (ref 0.4–3.1)

## 2020-08-31 LAB — HIV ANTIBODY (ROUTINE TESTING W REFLEX): HIV Screen 4th Generation wRfx: NONREACTIVE

## 2020-08-31 LAB — BRAIN NATRIURETIC PEPTIDE: B Natriuretic Peptide: 57.4 pg/mL (ref 0.0–100.0)

## 2020-08-31 LAB — FERRITIN: Ferritin: 293 ng/mL (ref 11–307)

## 2020-08-31 LAB — VITAMIN B12: Vitamin B-12: 607 pg/mL (ref 180–914)

## 2020-08-31 LAB — MAGNESIUM
Magnesium: 0.9 mg/dL — CL (ref 1.7–2.4)
Magnesium: 1 mg/dL — ABNORMAL LOW (ref 1.7–2.4)

## 2020-08-31 LAB — FOLATE: Folate: 7.7 ng/mL

## 2020-08-31 LAB — PREPARE RBC (CROSSMATCH)

## 2020-08-31 LAB — SODIUM, URINE, RANDOM: Sodium, Ur: 100 mmol/L

## 2020-08-31 MED ORDER — CEFAZOLIN SODIUM-DEXTROSE 2-4 GM/100ML-% IV SOLN
2.0000 g | INTRAVENOUS | Status: AC
  Administered 2020-09-01: 2 g via INTRAVENOUS
  Filled 2020-08-31: qty 100

## 2020-08-31 MED ORDER — MAGNESIUM SULFATE 4 GM/100ML IV SOLN
4.0000 g | Freq: Once | INTRAVENOUS | Status: AC
  Administered 2020-08-31: 4 g via INTRAVENOUS
  Filled 2020-08-31: qty 100

## 2020-08-31 MED ORDER — SODIUM CHLORIDE 0.9 % IV SOLN
510.0000 mg | Freq: Once | INTRAVENOUS | Status: AC
  Administered 2020-08-31: 510 mg via INTRAVENOUS
  Filled 2020-08-31: qty 17

## 2020-08-31 MED ORDER — KIDNEY FAILURE BOOK
Freq: Once | Status: DC
  Filled 2020-08-31: qty 1

## 2020-08-31 MED ORDER — CALCIUM GLUCONATE-NACL 1-0.675 GM/50ML-% IV SOLN
1.0000 g | Freq: Once | INTRAVENOUS | Status: AC
  Administered 2020-08-31: 1000 mg via INTRAVENOUS
  Filled 2020-08-31: qty 50

## 2020-08-31 MED ORDER — CHLORHEXIDINE GLUCONATE CLOTH 2 % EX PADS
6.0000 | MEDICATED_PAD | Freq: Every day | CUTANEOUS | Status: DC
  Administered 2020-09-01: 6 via TOPICAL

## 2020-08-31 MED ORDER — SEVELAMER CARBONATE 800 MG PO TABS
800.0000 mg | ORAL_TABLET | Freq: Three times a day (TID) | ORAL | Status: DC
  Administered 2020-08-31 – 2020-09-05 (×14): 800 mg via ORAL
  Filled 2020-08-31 (×14): qty 1

## 2020-08-31 MED ORDER — HYDROXYZINE HCL 25 MG PO TABS
50.0000 mg | ORAL_TABLET | Freq: Once | ORAL | Status: AC
  Administered 2020-08-31: 50 mg via ORAL
  Filled 2020-08-31: qty 2

## 2020-08-31 MED ORDER — SODIUM CHLORIDE 0.9% IV SOLUTION
Freq: Once | INTRAVENOUS | Status: AC
  Administered 2020-08-31: 10 mL via INTRAVENOUS

## 2020-08-31 MED ORDER — HEPARIN SODIUM (PORCINE) 5000 UNIT/ML IJ SOLN
5000.0000 [IU] | Freq: Three times a day (TID) | INTRAMUSCULAR | Status: DC
  Administered 2020-09-01 – 2020-09-05 (×9): 5000 [IU] via SUBCUTANEOUS
  Filled 2020-08-31 (×9): qty 1

## 2020-08-31 NOTE — Plan of Care (Signed)
Pt completed 1unit PRBC transfusion, VS recorded, no adverse reactions noted.   Problem: Health Behavior/Discharge Planning: Goal: Ability to manage health-related needs will improve Outcome: Progressing   Problem: Clinical Measurements: Goal: Ability to maintain clinical measurements within normal limits will improve Outcome: Progressing   Problem: Activity: Goal: Risk for activity intolerance will decrease Outcome: Progressing   Problem: Nutrition: Goal: Adequate nutrition will be maintained Outcome: Progressing   Problem: Coping: Goal: Level of anxiety will decrease Outcome: Progressing   Problem: Elimination: Goal: Will not experience complications related to bowel motility Outcome: Progressing   Problem: Pain Managment: Goal: General experience of comfort will improve Outcome: Progressing   Problem: Safety: Goal: Ability to remain free from injury will improve Outcome: Progressing   Problem: Skin Integrity: Goal: Risk for impaired skin integrity will decrease Outcome: Progressing

## 2020-08-31 NOTE — Progress Notes (Signed)
Date and time results received: 08/31/20 0400 (use smartphrase ".now" to insert current time)  Test: Ca+= 5.8 and Mg = 0.9  Critical Value: 5.8, 0.9  Name of Provider Notified: [facility]  Orders Received? Or Actions Taken?: Currently receiving Mg replacement. Ca+ replacement recieved less than 5 hours ago, will await additional orders if needed.

## 2020-08-31 NOTE — Progress Notes (Signed)
Referring Physician(s): Alicia [name]  Supervising Physician: Alicia [name]  Patient Status:  [facility] - In-pt  Chief Complaint:  Worsening renal failure, nausea  Subjective: Patient familiar to IR service from multiple paracenteses.  She has a past medical history significant for recurrent metastatic breast [name], hypertension, alcoholic cirrhosis with recurrent ascites, portal hypertension, anemia, and GERD.  She has chronic kidney disease with progression now to end-stage renal disease.  Request now received for tunneled hemodialysis catheter placement.  She currently denies fever, headache, chest pain, dyspnea, cough, worsening abdominal pain, back pain, vomiting or bleeding.  She does have abdominal distention.  Past Medical History:  Diagnosis Date  . Ascites   . Breast [name] ([place])   . [name] ([place])   . Chronic anticoagulation   . Cirrhosis ([place])   . CKD (chronic kidney disease), stage III ([place])   . Dyspnea    with fluid buildup   . Esophageal varices ([place]) 11/2016   Small noted on ENDO  . GERD (gastroesophageal reflux disease)   . Hypertension   . Hypotension   . Portal hypertension ([facility])    Past Surgical History:  Procedure Laterality Date  . BREAST LUMPECTOMY    . BREAST SURGERY    . CESAREAN SECTION    . COLONOSCOPY WITH PROPOFOL N/A 05/25/2019   Procedure: COLONOSCOPY WITH PROPOFOL;  Surgeon: Wilford Corner, MD;  [facility];  Service: Endoscopy;  Laterality: N/A;  . ESOPHAGOGASTRODUODENOSCOPY (EGD) WITH PROPOFOL N/A 11/21/2016   Procedure: ESOPHAGOGASTRODUODENOSCOPY;  Surgeon: Milus Banister, MD;  [facility];  Service: Endoscopy;  Laterality: N/A;  . ESOPHAGOGASTRODUODENOSCOPY (EGD) WITH PROPOFOL N/A 05/25/2019   Procedure: ESOPHAGOGASTRODUODENOSCOPY (EGD) WITH PROPOFOL;  Surgeon: Wilford Corner, MD;  [facility];  Service: Endoscopy;  Laterality: N/A;  . IR PARACENTESIS  09/04/2016  . IR PARACENTESIS  09/20/2016  . IR  PARACENTESIS  10/22/2016  . IR PARACENTESIS  01/27/2017  . IR PARACENTESIS  04/11/2017  . IR PARACENTESIS  06/05/2017  . IR PARACENTESIS  06/05/2017  . IR PARACENTESIS  06/19/2017  . IR PARACENTESIS  07/03/2017  . IR PARACENTESIS  07/17/2017  . IR PARACENTESIS  08/01/2017  . IR PARACENTESIS  08/14/2017  . IR PARACENTESIS  08/28/2017  . IR PARACENTESIS  09/11/2017  . IR PARACENTESIS  09/25/2017  . IR PARACENTESIS  10/08/2017  . IR PARACENTESIS  10/23/2017  . IR PARACENTESIS  11/06/2017  . IR PARACENTESIS  11/20/2017  . IR PARACENTESIS  12/04/2017  . IR PARACENTESIS  12/18/2017  . IR PARACENTESIS  01/19/2018  . IR PARACENTESIS  02/09/2018  . IR PARACENTESIS  02/24/2018  . IR PARACENTESIS  03/16/2018  . IR PARACENTESIS  04/10/2018  . IR PARACENTESIS  05/08/2018  . IR PARACENTESIS  06/11/2018  . IR PARACENTESIS  07/07/2018  . IR PARACENTESIS  09/04/2018  . IR PARACENTESIS  10/22/2018  . IR PARACENTESIS  11/30/2018  . IR PARACENTESIS  01/14/2019  . IR PARACENTESIS  04/30/2019  . IR PARACENTESIS  07/19/2019  . IR RADIOLOGIST EVAL & MGMT  12/23/2017  . LYMPH NODE DISSECTION        Allergies: Compazine [prochlorperazine edisylate], Flexeril [cyclobenzaprine], and Meclizine  Medications: Prior to Admission medications   Medication Sig Start Date End Date Taking? Authorizing Provider  acetaminophen (TYLENOL) 500 MG tablet Take 500 mg by mouth every 6 (six) hours as needed for moderate pain or headache.   Yes [provider]  amLODipine (NORVASC) 10 MG tablet Take 1 tablet by mouth daily.  05/14/20  Yes [provider]  aspirin-acetaminophen-caffeine (EXCEDRIN MIGRAINE) [phone] MG tablet Take 1 tablet by mouth every 6 (six) hours as needed for headache.   Yes [provider]  calcitRIOL (ROCALTROL) 0.5 MCG capsule Take 0.5 mcg by mouth daily.   Yes [provider]  calcium carbonate (OSCAL) 1500 (600 Ca) MG TABS tablet Take 3 tablets by mouth 3 (three) times daily with meals.    Yes [provider]  furosemide (LASIX) 40 MG tablet Take 40 mg by mouth daily. 02/21/20  Yes [provider]  hydrALAZINE (APRESOLINE) 100 MG tablet Take 100 mg by mouth 3 (three) times daily. 05/16/20  Yes [provider]  omeprazole (PRILOSEC) 40 MG capsule Take 1 capsule (40 mg total) by mouth daily. Patient taking differently: Take 40 mg by mouth daily as needed (indigestion). 08/08/16  Yes Barton Dubois, MD  sodium bicarbonate 650 MG tablet Take 650 mg by mouth 3 (three) times daily. 02/22/20  Yes [provider]  sodium chloride (OCEAN) 0.65 % SOLN nasal spray Place 1 spray into both nostrils as needed for congestion.   Yes [provider]  spironolactone (ALDACTONE) 25 MG tablet Take 1 tablet by mouth daily. 08/29/20  Yes [provider]     Vital Signs: BP (!) 142/87 (BP Location: Left Arm)   Pulse 92   Temp 97.6 F (36.4 C) (Oral)   Resp 17   SpO2 100%   Physical Exam awake, alert.  Chest clear to auscultation bilaterally.  Clean, intact right chest wall Port-A-Cath.  Heart with regular rate /rhythm.  Abdomen distended, positive bowel sounds, nontender.  1+ bilateral lower extremity edema   Imaging: DG Chest 2 View  Result Date: 08/30/2020 CLINICAL DATA:  Fluid overload and peripheral swelling, initial encounter EXAM: CHEST - 2 VIEW COMPARISON:  None. FINDINGS: Cardiac shadow is within normal limits. The lungs are well aerated bilaterally. Right hemidiaphragm is elevated. Right-sided chest port is noted with catheter tip at cavoatrial junction. No acute bony abnormality is noted. IMPRESSION: No active cardiopulmonary disease. Electronically Signed   By: Inez Catalina M.D.   On: 08/30/2020 16:19   US Renal  Result Date: 08/30/2020 CLINICAL DATA:  Acute renal insufficiency EXAM: RENAL / URINARY TRACT ULTRASOUND COMPLETE COMPARISON:  08/10/2020 FINDINGS: Right Kidney: Renal measurements: 8.4 x 4.0 x 4.1 cm = volume: 71 mL. Renal  cortical thickness is preserved. Cortical echogenicity is diffusely increased, similar to prior examination. No hydronephrosis. No intrarenal masses or calcifications are seen. Left Kidney: Renal measurements: 8.4 x 4.1 x 3.7 cm = volume: 66 mL. Renal cortical thickness is preserved. Cortical echogenicity is diffusely increased, similar to prior examination. No hydronephrosis. No intrarenal masses or calcifications. Bladder: Appears normal for degree of bladder distention. Other: There is moderate ascites noted within the abdomen. The hepatic echotexture appears mildly coarsened in keeping with changes of underlying cirrhosis, though this is not well assessed on this exam. IMPRESSION: Increased renal cortical echogenicity, similar to prior examination, in keeping with changes of underlying medical renal disease. Moderate ascites. Electronically Signed   By: Fidela Salisbury MD   On: 08/30/2020 21:28    Labs:  CBC: Recent Labs    03/09/20 1305 03/23/20 1345 08/30/20 1544 08/31/20 0146  WBC  --   --  5.2 4.2  HGB 8.8* 8.3* 7.9* 6.6*  HCT  --   --  26.8* 21.3*  PLT  --   --  149* 122*    COAGS: No results for  input(s): INR, APTT in the last 8760 hours.  BMP: Recent Labs    08/30/20 1544 08/31/20 0146  NA 134* 134*  K 5.2* 5.3*  CL 102 102  CO2 22 20*  GLUCOSE 108* 126*  BUN 57* 56*  CALCIUM 5.7* 5.8*  CREATININE 9.68* 9.34*  GFRNONAA 4* 4*    LIVER FUNCTION TESTS: Recent Labs    08/30/20 1544 08/31/20 0146  BILITOT 0.8  --   AST 27  --   ALT 16  --   ALKPHOS 89  --   PROT 6.5  --   ALBUMIN 2.8* 2.3*    Assessment and Plan: Patient familiar to IR service from multiple paracenteses.  She has a past medical history significant for recurrent metastatic breast [name], hypertension, alcoholic cirrhosis with recurrent ascites, portal hypertension, anemia, and GERD.  She has chronic kidney disease with progression now to end-stage renal disease.  Request now received for  tunneled hemodialysis catheter placement.  Details/risks of procedure, including but not limited to, internal bleeding, infection, injury to adjacent structures discussed with patient with her understanding and consent.  Procedure scheduled for 5/27.   Electronically Signed: D. Rowe Robert, PA-C 08/31/2020, 2:49 PM   I spent a total of 20 minutes at the the patient's bedside AND on the patient's hospital floor or unit, greater than 50% of which was counseling/coordinating care for tunneled hemodialysis catheter placement    Patient ID: Alicia [name], female   DOB: Feb 09, 1956, 65 y.o.   MRN: [id number]

## 2020-08-31 NOTE — Consult Note (Addendum)
CONSULT NOTE   MRN : [id number]  Reason for Consult: Acute on CKD Referring Physician: Dr. Royce Macadamia  History of Present Illness: 66 y/o female with history of CKD that has progressed.  We have been asked to provide permanent  Access.  She was seen by [name] on 07/27/20.  He had planned   First stage right basilic vein fistula after reviewing the vein mapping  she did have a 4 mm basilic vein She had an arterial duplex which shows proximal occlusion of the right radial artery but patent distally.  Brachial artery was patent with a 5 mm diameter.  Ulnar artery was patent.   Past medical history:recurrent metastatic breast cancer, hypertension, alcoholic cirrhosis with recurrent ascites needing paracentesis every 2 weeks, portal hypertension, anemia of chronic disease, gastroesophageal reflux disease and chronic kidney disease stage V (creatinine 5.6-6.3 at baseline).    Current Facility-Administered Medications  Medication Dose Route Frequency Provider Last Rate Last Admin  . acetaminophen (TYLENOL) tablet 650 mg  650 mg Oral Q6H PRN Opyd, Ilene Qua, MD       Or  . acetaminophen (TYLENOL) suppository 650 mg  650 mg Rectal Q6H PRN Opyd, Ilene Qua, MD      . amLODipine (NORVASC) tablet 10 mg  10 mg Oral Daily Opyd, Ilene Qua, MD   10 mg at 08/31/20 0934  . calcitRIOL (ROCALTROL) capsule 0.5 mcg  0.5 mcg Oral Daily Opyd, Ilene Qua, MD   0.5 mcg at 08/31/20 0933  . calcium carbonate (TUMS - dosed in mg elemental calcium) chewable tablet 1,000 mg  1,000 mg Oral BID Opyd, Ilene Qua, MD   1,000 mg at 08/31/20 0936  . calcium gluconate 1 g/ 50 mL sodium chloride IVPB  1 g Intravenous Once [name], MD      . Chlorhexidine Gluconate Cloth 2 % PADS 6 each  6 each Topical Daily Opyd, Ilene Qua, MD   6 each at 08/31/20 0935  . ferumoxytol (FERAHEME) 510 mg in sodium chloride 0.9 % 100 mL IVPB  510 mg Intravenous Once Harrie Jeans C, MD      . furosemide (LASIX) injection 80 mg  80 mg  Intravenous BID Elmarie Shiley, MD   80 mg at 08/31/20 2683  . heparin injection 5,000 Units  5,000 Units Subcutaneous Q8H Opyd, Ilene Qua, MD   5,000 Units at 08/31/20 0539  . hydrALAZINE (APRESOLINE) tablet 100 mg  100 mg Oral TID Vianne Bulls, MD   100 mg at 08/31/20 0934  . sevelamer carbonate (RENVELA) tablet 800 mg  800 mg Oral TID WC Harrie Jeans C, MD      . sodium bicarbonate tablet 650 mg  650 mg Oral TID Vianne Bulls, MD   650 mg at 08/31/20 0934  . sodium chloride (OCEAN) 0.65 % nasal spray 1 spray  1 spray Each Nare PRN Opyd, Ilene Qua, MD      . sodium chloride flush (NS) 0.9 % injection 10-40 mL  10-40 mL Intracatheter PRN Opyd, Ilene Qua, MD        Pt meds include: Statin :No Betablocker: No ASA: No Other anticoagulants/antiplatelets:   Past Medical History:  Diagnosis Date  . Ascites   . Breast cancer ([place])   . Cancer ([place])   . Chronic anticoagulation   . Cirrhosis ([place])   . CKD (chronic kidney disease), stage III ([place])   . Dyspnea    with fluid buildup   . Esophageal varices ([place]) 11/2016  Small noted on ENDO  . GERD (gastroesophageal reflux disease)   . Hypertension   . Hypotension   . Portal hypertension ([facility])     Past Surgical History:  Procedure Laterality Date  . BREAST LUMPECTOMY    . BREAST SURGERY    . CESAREAN SECTION    . COLONOSCOPY WITH PROPOFOL N/A 05/25/2019   Procedure: COLONOSCOPY WITH PROPOFOL;  Surgeon: Wilford Corner, MD;  Location: [facility];  Service: Endoscopy;  Laterality: N/A;  . ESOPHAGOGASTRODUODENOSCOPY (EGD) WITH PROPOFOL N/A 11/21/2016   Procedure: ESOPHAGOGASTRODUODENOSCOPY;  Surgeon: Milus Banister, MD;  Location: [facility];  Service: Endoscopy;  Laterality: N/A;  . ESOPHAGOGASTRODUODENOSCOPY (EGD) WITH PROPOFOL N/A 05/25/2019   Procedure: ESOPHAGOGASTRODUODENOSCOPY (EGD) WITH PROPOFOL;  Surgeon: Wilford Corner, MD;  Location: [facility];  Service: Endoscopy;  Laterality: N/A;  . IR PARACENTESIS  09/04/2016   . IR PARACENTESIS  09/20/2016  . IR PARACENTESIS  10/22/2016  . IR PARACENTESIS  01/27/2017  . IR PARACENTESIS  04/11/2017  . IR PARACENTESIS  06/05/2017  . IR PARACENTESIS  06/05/2017  . IR PARACENTESIS  06/19/2017  . IR PARACENTESIS  07/03/2017  . IR PARACENTESIS  07/17/2017  . IR PARACENTESIS  08/01/2017  . IR PARACENTESIS  08/14/2017  . IR PARACENTESIS  08/28/2017  . IR PARACENTESIS  09/11/2017  . IR PARACENTESIS  09/25/2017  . IR PARACENTESIS  10/08/2017  . IR PARACENTESIS  10/23/2017  . IR PARACENTESIS  11/06/2017  . IR PARACENTESIS  11/20/2017  . IR PARACENTESIS  12/04/2017  . IR PARACENTESIS  12/18/2017  . IR PARACENTESIS  01/19/2018  . IR PARACENTESIS  02/09/2018  . IR PARACENTESIS  02/24/2018  . IR PARACENTESIS  03/16/2018  . IR PARACENTESIS  04/10/2018  . IR PARACENTESIS  05/08/2018  . IR PARACENTESIS  06/11/2018  . IR PARACENTESIS  07/07/2018  . IR PARACENTESIS  09/04/2018  . IR PARACENTESIS  10/22/2018  . IR PARACENTESIS  11/30/2018  . IR PARACENTESIS  01/14/2019  . IR PARACENTESIS  04/30/2019  . IR PARACENTESIS  07/19/2019  . IR RADIOLOGIST EVAL & MGMT  12/23/2017  . LYMPH NODE DISSECTION      Social History Social History   Tobacco Use  . Smoking status: Never Smoker  . Smokeless tobacco: Never Used  Vaping Use  . Vaping Use: Never used  Substance Use Topics  . Alcohol use: No    Comment: occassional wine   . Drug use: No    Family History Family History  Problem Relation Age of Onset  . Diabetes Mother   . Diabetes Father   . Diabetes Brother   . Colon cancer Neg Hx   . Stomach cancer Neg Hx   . Esophageal cancer Neg Hx     Allergies  Allergen Reactions  . Compazine [Prochlorperazine Edisylate] Anxiety  . Flexeril [Cyclobenzaprine] Anxiety  . Meclizine Anxiety     REVIEW OF SYSTEMS  General: [ ]  Weight loss, [ ]  Fever, [ ]  chills Neurologic: [ ]  Dizziness, [ ]  Blackouts, [ ]  Seizure [ ]  Stroke, [ ]  "Mini stroke", [ ]  Slurred speech, [ ]  Temporary blindness; [ ]   weakness in arms or legs, [ ]  Hoarseness [ ]  Dysphagia Cardiac: [ ]  Chest pain/pressure, [ ]  Shortness of breath at rest [ ]  Shortness of breath with exertion, [ ]  Atrial fibrillation or irregular heartbeat  Vascular: [ ]  Pain in legs with walking, [ ]  Pain in legs at rest, [ ]  Pain in legs at night,  [ ]   Non-healing ulcer, [ ]  Blood clot in vein/DVT,   Pulmonary: [ ]  Home oxygen, [ ]  Productive cough, [ ]  Coughing up blood, [ ]  Asthma,  [ ]  Wheezing [ ]  COPD Musculoskeletal:  [ ]  Arthritis, [ ]  Low back pain, [ ]  Joint pain Hematologic: [ ]  Easy Bruising, [ ]  Anemia; [ ]  Hepatitis Gastrointestinal: [ ]  Blood in stool, [ ]  Gastroesophageal Reflux/heartburn, Urinary: [x ] chronic Kidney disease, [ ]  on HD - [ ]  MWF or [ ]  TTHS, [ ]  Burning with urination, [ ]  Difficulty urinating Skin: [ ]  Rashes, [ ]  Wounds Psychological: [ ]  Anxiety, [ ]  Depression  Physical Examination Vitals:   08/31/20 0831 08/31/20 1014 08/31/20 1038 08/31/20 1245  BP: 123/89 125/71 113/73 (!) 142/87  Pulse: 94 (!) 102 100 92  Resp: 15 17 16 17   Temp: 97.9 F (36.6 C) 98.3 F (36.8 C) (!) 97.5 F (36.4 C) 97.6 F (36.4 C)  TempSrc: Oral Oral Oral Oral  SpO2: 99% 100% 100% 100%   There is no height or weight on file to calculate BMI.  General:  WDWN in NAD HENT: WNL Eyes: Pupils equal Pulmonary: normal non-labored breathing , without Rales, rhonchi,  wheezing Cardiac: RRR, without  Murmurs, rubs or gallops; No carotid bruits Abdomen: soft, NT, no masses Skin: no rashes, ulcers noted;  no Gangrene , no cellulitis; no open wounds;   Vascular Exam/Pulses:palpable right radial pulse, right UE M/V/N intact   Musculoskeletal: no muscle wasting or atrophy; B LE edema  Neurologic: A&O X 3; Appropriate Affect ;  SENSATION: normal; MOTOR FUNCTION: 5/5 Symmetric Speech is fluent/normal   Significant Diagnostic Studies: CBC Lab Results  Component Value Date   WBC 4.2 08/31/2020   HGB 6.6 (LL) 08/31/2020    HCT 21.3 (L) 08/31/2020   MCV 86.6 08/31/2020   PLT 122 (L) 08/31/2020    BMET    Component Value Date/Time   NA 134 (L) 08/31/2020 0146   K 5.3 (H) 08/31/2020 0146   CL 102 08/31/2020 0146   CO2 20 (L) 08/31/2020 0146   GLUCOSE 126 (H) 08/31/2020 0146   BUN 56 (H) 08/31/2020 0146   CREATININE 9.34 (H) 08/31/2020 0146   CALCIUM 5.8 (LL) 08/31/2020 0146   GFRNONAA 4 (L) 08/31/2020 0146   GFRAA 36 (L) 11/08/2017 0536   CrCl cannot be calculated (Unknown ideal weight.).  COAG Lab Results  Component Value Date   INR 1.0 12/30/2018   INR 1.14 11/06/2017   INR 1.00 06/04/2017   07/27/20  +-----------------+-------------+----------+--------------+  Right Cephalic  Diameter (cm)Depth (cm)  Findings    +-----------------+-------------+----------+--------------+  Shoulder       0.20                 +-----------------+-------------+----------+--------------+  Prox upper arm    0.16                 +-----------------+-------------+----------+--------------+  Mid upper arm    0.12                 +-----------------+-------------+----------+--------------+  Dist upper arm    0.13                 +-----------------+-------------+----------+--------------+  Antecubital fossa  0.25                 +-----------------+-------------+----------+--------------+  Prox forearm     0.13                 +-----------------+-------------+----------+--------------+  Mid forearm  not visualized  +-----------------+-------------+----------+--------------+  Dist forearm               not visualized  +-----------------+-------------+----------+--------------+   +-----------------+-------------+----------+--------+  Right Basilic  Diameter (cm)Depth (cm)Findings   +-----------------+-------------+----------+--------+  Dist upper arm    0.41              +-----------------+-------------+----------+--------+  Antecubital fossa  0.41              +-----------------+-------------+----------+--------+   +-----------------+-------------+----------+--------------+  Left Cephalic  Diameter (cm)Depth (cm)  Findings    +-----------------+-------------+----------+--------------+  Shoulder       0.11                 +-----------------+-------------+----------+--------------+  Prox upper arm    0.07                 +-----------------+-------------+----------+--------------+  Mid upper arm              not visualized  +-----------------+-------------+----------+--------------+  Dist upper arm              not visualized  +-----------------+-------------+----------+--------------+  Antecubital fossa            not visualized  +-----------------+-------------+----------+--------------+  Prox forearm     0.13                 +-----------------+-------------+----------+--------------+  Mid forearm               not visualized  +-----------------+-------------+----------+--------------+  Dist forearm               not visualized  +-----------------+-------------+----------+--------------+   +-----------------+-------------+----------+--------+  Left Basilic   Diameter (cm)Depth (cm)Findings  +-----------------+-------------+----------+--------+  Mid upper arm    0.25              +-----------------+-------------+----------+--------+  Dist upper arm    0.30              +-----------------+-------------+----------+--------+  Antecubital fossa  0.21               +-----------------+-------------+----------+--------+   Summary: Right: Patent cephalic and basilic veins where visualized.  Left: Patent cephalic and basilic veins where visualized.     Right Pre-Dialysis Findings:  +-----------------------+----------+--------------------+----------+-------  -+  Location        PSV (cm/s)Intralum. Diam. (cm)Waveform   Comments  +-----------------------+----------+--------------------+----------+-------  -+  Brachial Antecub. [id number]    0.47        triphasic        +-----------------------+----------+--------------------+----------+-------  -+  Radial Art at Wrist  26    0.22        monophasic       +-----------------------+----------+--------------------+----------+-------  -+  Ulnar Art at Wrist   98    0.19        triphasic        +-----------------------+----------+--------------------+----------+-------  -+        Left Pre-Dialysis Findings:  +-----------------------+----------+--------------------+---------+--------  +  Location        PSV (cm/s)Intralum. Diam. (cm)Waveform  Comments  +-----------------------+----------+--------------------+---------+--------  +  Brachial Antecub. [id number]    0.47        triphasic        +-----------------------+----------+--------------------+---------+--------  +  Radial Art at Wrist  68    0.24        triphasic        +-----------------------+----------+--------------------+---------+--------  +  Ulnar Art at Wrist   61    0.21        triphasic        +-----------------------+----------+--------------------+---------+--------  +  Summary:    Right: Patent brachial, and ulnar arteries. The distal radial artery     velocity is dampened with monophasic waveform suggusting     proximal obstruction.  Left:  Patent brachial, radial, and ulnar arteries.    ASSESSMENT/PLAN:  ESRD planned   First stage right basilic vein fistula after reviewing the vein mapping  she did have a 4 mm basilic vein She had an arterial duplex which shows proximal occlusion of the right radial artery but patent distally.  Brachial artery was patent with a 5 mm diameter.  Ulnar artery was patent.    Roxy Horseman 08/31/2020 12:55 PM   I have independently interviewed and examined patient and agree with PA assessment and plan above. Will plan tdc and right arm avf vs avg tomorrow in OR. If IR places tdc tomorrow will plan right arm procedure early next week after holiday/prior to discharge.  Oona Trammel C. Donzetta Matters, MD [facility] Office: [phone] Pager: [phone]

## 2020-08-31 NOTE — Plan of Care (Signed)
Please also see my addended progress note from today:   Vascular has seen patient and they are able to do tunneled catheter and AVF creation tomorrow, 5/27.   I called IR to cancel the tunneled catheter with IR.   [name], MD 3:43 PM 08/31/2020

## 2020-08-31 NOTE — Progress Notes (Signed)
Date and time results received: 08/31/20 0515   Test: Hgb Critical Value: 6.6  Name of Provider Notified: [facility]  Orders Received? Or Actions Taken?: awaiting

## 2020-08-31 NOTE — Plan of Care (Signed)

## 2020-08-31 NOTE — Progress Notes (Addendum)
[facility] Progress Note  Name: Alicia [name] MRN: [id number] DOB: [birth year]    Subjective:  Patient had 600 ml UOP and one unmeasured urine void on 5/25.  She and Dr. Carolin [name] had discussed starting HD and had been referred for AVF and seen but not yet placed.  She has been requiring paracenteses for several years per pt.  We discussed risks/benefits/indications for starting dialysis and she does consent to starting HD.  We discussed calling vascular for AVF placement this admission and not eating after midnight for tunneled catheter tomorrow.  She is getting blood right now.  Review of systems:  Denies shortness of breath or chest pain Some nausea no vomiting  She had a pack of graham crackers about an hour ago --------------- Background on consult:  65 year old unfortunate African-American woman with past medical history significant for recurrent metastatic breast cancer, hypertension, alcoholic cirrhosis with recurrent ascites needing paracentesis every 2 weeks, portal hypertension, anemia of chronic disease, gastroesophageal reflux disease and chronic kidney disease stage V (creatinine 5.6-6.3 at baseline).  She was referred to the emergency room by her primary care provider Alicia [name], Alicia [name]) who obtained labs yesterday that showed an acute rise of her creatinine to 9.6 with hyperkalemia of 5.6, hypocalcemia of 6.0 and metabolic acidosis with a bicarbonate of 18.  She complains of progressively worsening lower extremity edema, increasing abdominal distention in spite of 4.3 L paracentesis last week and easy fatigability with some cramping of the lower extremities.  She denies any nausea or vomiting but reports poor appetite with some GERD type symptoms.  She denies any chest pain or shortness of breath at rest and denies orthopnea.  She denies any dysuria, urgency, frequency, flank pain, fever, chills or hematuria.  She is supposed to have been taking furosemide 60  mg daily along with spironolactone 25 mg daily.  She has had discussions with Dr. Carolin [name] (her outpatient nephrologist at [facility]) and has made the decision to pursue renal replacement therapy.  She was seen last month by Dr. Oneida [name] of vascular surgery for dialysis access planning and was found to have marginal veins with a potential target for right BBF which she declined to schedule at the time.  We had a lengthy discussion today regarding her renal function and the possibility that she may need to initiate dialysis of which she is accepting    Intake/Output Summary (Last 24 hours) at 08/31/2020 1159 Last data filed at 08/31/2020 0746 Gross per 24 hour  Intake 100 ml  Output 600 ml  Net -500 ml    Vitals:  Vitals:   08/31/20 0542 08/31/20 0831 08/31/20 1014 08/31/20 1038  BP: 139/89 123/89 125/71 113/73  Pulse: 85 94 (!) 102 100  Resp: 12 15 17 16   Temp: 98.3 F (36.8 C) 97.9 F (36.6 C) 98.3 F (36.8 C) (!) 97.5 F (36.4 C)  TempSrc: Oral Oral Oral Oral  SpO2: 99% 99% 100% 100%     Physical Exam:  General adult female in bed in no acute distress HEENT normocephalic atraumatic extraocular movements intact sclera anicteric Neck supple trachea midline Lungs clear to auscultation bilaterally normal work of breathing at rest  Heart S1S2 no rub Abdomen soft nontender distended Extremities 1+ edema  Psych normal mood and affect Neuro - alert and oriented x 3 provides hx and follows commands  Medications reviewed   Labs:  BMP Latest Ref Rng & Units 08/31/2020 08/30/2020 12/30/2018  Glucose 70 - 99 mg/dL 126(H) 108(H) 100(H)  BUN 8 - 23 mg/dL 56(H) 57(H) 26(H)  Creatinine 0.44 - 1.00 mg/dL 9.34(H) 9.68(H) 2.23(H)  Sodium 135 - 145 mmol/L 134(L) 134(L) 135  Potassium 3.5 - 5.1 mmol/L 5.3(H) 5.2(H) 4.3  Chloride 98 - 111 mmol/L 102 102 105  CO2 22 - 32 mmol/L 20(L) 22 16(L)  Calcium 8.9 - 10.3 mg/dL 5.8(LL) 5.7(LL) 7.8(L)     Assessment/Plan:   1. CKD  stage V with progression to ESRD - NPO after midnight for tunneled catheter with IR - HD tomorrow after access placed.  Plan for HD on 5/28 as well  - Consulted IR for tunneled catheter - Consulted vascular surgery for AVF creation  - Contacted HD SW to coordinate outpatient HD - on lasix 80 mg IV BID for now   2.  Hyperkalemia: Mild, likely exacerbated by worsening of renal insufficiency.  Discontinue spironolactone indefinitely at this time and attempt diuresis with intravenous furosemide  3.  Hypocalcemia: Corrected calcium remains low.  Calcium gluconate once now.  PTH pending. On calcitriol.  We are starting HD  4. Metabolic bone disease - Continue calcitriol 0.5 mcg daily. PTH pending. Starting renvela for hyperphosphatemia  5.  Non-anion gap metabolic acidosis: Secondary to progressive chronic kidney disease.  Stop bicarb once on HD  6.  Anemia of chronic disease: No evidence of overt loss although this remains under close monitoring with history of portal hypertension and esophageal varices. feraheme once on 5/26. PRBC's on 5/26.  with malignancy defer ESA at this time.  (recurrent breast cancer)  7. Cirrhosis with ascites - requires frequent paracenteses. Stop spironolactone   Alicia Desanctis, MD 08/31/2020 12:44 PM   Vascular has seen patient and they are able to do tunneled catheter and AVF creation tomorrow, 5/27.   I called IR to cancel the tunneled catheter with IR.   Alicia Desanctis, MD 3:42 PM 08/31/2020

## 2020-08-31 NOTE — Progress Notes (Signed)
PROGRESS NOTE    Alicia [name]  DXA:[id number] DOB: 03/15/1956 DOA: 08/30/2020 PCP: Mindi Curling, PA-C   Chief Complaint  Patient presents with  . Abnormal Lab  Brief Narrative: 65 year old female with history of alcoholic liver cirrhosis with ascites, chronic kidney disease, hypertension, metastatic breast cancer, chronic anemia presented for evaluation of worsening renal function on outpatient blood work.  Patient reports she has been having diarrhea approximately a month ago that started once he started taking Imodium.  Also having loss of appetite roughly 1 month but has not improved.  Denied vomiting.  She required paracentesis roughly once a month most recently 5/17.  She reports she had blood transfusion in [place] previous week and reports she has not been drinking alcohol for at least 2 weeks She has been having swelling, recently saw vascular surgery in April for right basilic vein fistula but has not been scheduled yet. In the ED, afebrile, saturating well on room air, and with stable blood pressure.  EKG features sinus rhythm with QTc interval 501 ms.  Chest x-ray negative for acute cardiopulmonary disease.  Chemistry panel notable for potassium 5.2, normal bicarbonate, BUN 57, creatinine 9.68, albumin 2.8, and calcium 5.7.  Hemoglobin is 7.9 with normal MCV and platelets 149,000.  Nephrology was consulted by the ED physician and the patient was treated with 80 mg IV Lasix, IV calcium, and oral calcium  Subjective: Seen and examined this morning.  Pleasant, no new complaints.  On room air.  No tremors confusion. Denies any diarrhea Reports her abdominal distention is same and has not worsened  Assessment & Plan:  Acute kidney injury on CKD stage V: Suspecting hemodynamically mediated AKI on the background of third spacing in the setting of cirrhosis, elevated blood pressure.  Attemptting to diurese with IV Lasix 80 mg every 12 hours  If unsuccessful, may need hemodialysis  via tunneled hemodialysis catheter.  Follow nephrology recommendation UA, renal ultrasound-increased renal cortical echogenicity similar to prior.Monitor strict intake output, pain control if needed will be with hydrocodone fentanyl methadone but avoid morphine avoid NSAIDs, avoid baclofen sodium phosphate mag citrate based laxatives.  Appreciate nephrology input on board.  Discussed with nephrology likely plan for tunneled catheter tomorrow for dialysis. Recent Labs  Lab 08/30/20 1544 08/31/20 0146  BUN 57* 56*  CREATININE 9.68* 9.34*   Non-anion gap metabolic acidosis in the setting of progressive CKD monitor on oral bicarb Hyperkalemia with AKI Aldactone discontinued Recent Labs  Lab 08/30/20 1544 08/31/20 0146  K 5.2* 5.3*   Alcoholic cirrhosis of liver with ascites, with moderate ascites last paracentesis 5/17 and gets monthly paracentesis.  Patient reports swelling has not worsened.  Monitor for any acute encephalopathy currently no asterixis.  Anemia of chronic disease no overt blood loss.  Follow-up iron studies.  History of portal hypertension and esophageal varices.Overnight hemoglobin has dropped  < 7 gm-transfuse 1 unit PRBC today,check posttransfusion H&H-monitor h/h.Patient reports she has had blood transfusion past week in [place]. Recent Labs  Lab 08/30/20 1544 08/31/20 0146  HGB 7.9* 6.6*  HCT 26.8* 21.3*   Hypocalcemia status post IV calcium glued yesterday.  Calcium 7.1, continue on oral calcium supplementation follow-up PTH phosphorus.  HTN:BP well controlled this morning.Continue hydralazine and Norvasc off Aldactone  Metastatic breast cancer, follows at oncology at [facility] in [place] had been on Taxol under November treatment now positive worsening on function.  Will need to follow-up with oncology upon discharge  Prolonged QT interval: 501 ms on admission: Continue QT prolonging medication  monitor calcium mag phos and normalize. Monitor ekg.  Diet Order             Diet renal with fluid restriction Fluid restriction: 1200 mL Fluid; Room service appropriate? Yes; Fluid consistency: Thin  Diet effective now               Patient's There is no height or weight on file to calculate BMI. DVT prophylaxis: heparin injection 5,000 Units Start: 08/30/20 2200 Code Status:   Code Status: Full Code  Family Communication: plan of care discussed with patient at bedside.  Status is: Inpatient Remains inpatient appropriate because:IV treatments appropriate due to intensity of illness or inability to take PO and Inpatient level of care appropriate due to severity of illness  Dispo: The patient is from: Home              Anticipated d/c is to: Home              Patient currently is not medically stable to d/c.   Difficult to place patient No Unresulted Labs (From admission, onward)          Start     Ordered   08/31/20 1500  Hemoglobin and hematocrit, blood  Once,   R       Question:  Specimen collection method  Answer:  IV Team=IV Team collect   08/31/20 1310   08/31/20 0500  Magnesium  Daily,   R      08/30/20 1953   08/31/20 0500  CBC  Daily,   R      08/30/20 1953   08/31/20 0500  Renal function panel  Daily,   R      08/30/20 2104   08/30/20 2105  Parathyroid hormone, intact (no Ca)  Add-on,   AD        08/30/20 2104   08/30/20 1940  Microalbumin / creatinine urine ratio  Once,   STAT        08/30/20 1939   Signed and Held  Hepatitis B surface antigen  (New Patient [facility] (Hepatitis B))  Once,   R       Question:  Specimen collection method  Answer:  IV Team=IV Team collect   Signed and Held   Signed and Held  Hepatitis B surface antibody  (New Patient [facility] (Hepatitis B))  Once,   R       Question:  Specimen collection method  Answer:  IV Team=IV Team collect   Signed and Held   Signed and Held  Hepatitis B core antibody, total  (New Patient [facility] (Hepatitis B))  Once,   R       Question:  Specimen collection method  Answer:   IV Team=IV Team collect   Signed and Held         Medications reviewed:  Scheduled Meds: . amLODipine  10 mg Oral Daily  . calcitRIOL  0.5 mcg Oral Daily  . calcium carbonate  1,000 mg Oral BID  . Chlorhexidine Gluconate Cloth  6 each Topical Daily  . [START ON 09/01/2020] Chlorhexidine Gluconate Cloth  6 each Topical Q0600  . furosemide  80 mg Intravenous BID  . heparin  5,000 Units Subcutaneous Q8H  . hydrALAZINE  100 mg Oral TID  . sevelamer carbonate  800 mg Oral TID WC  . sodium bicarbonate  650 mg Oral TID   Continuous Infusions: . calcium gluconate 1,000 mg (08/31/20 1404)  . ferumoxytol  Consultants:see note  Procedures:see note  Antimicrobials: Anti-infectives (From admission, onward)   None     Culture/Microbiology    Component Value Date/Time   SDES PERITONEAL 06/05/2017 [place] 06/05/2017 1117   SPECREQUEST NONE 06/05/2017 1117   SPECREQUEST NONE 06/05/2017 1117   CULT STREPTOCOCCUS MITIS/ORALIS (A) 06/05/2017 1117   REPTSTATUS 06/09/2017 FINAL 06/05/2017 1117   REPTSTATUS 06/05/2017 FINAL 06/05/2017 1117    Other culture-see note  Objective: Vitals: Today's Vitals   08/31/20 1014 08/31/20 1038 08/31/20 1245 08/31/20 1405  BP: 125/71 113/73 (!) 142/87   Pulse: (!) 102 100 92   Resp: 17 16 17    Temp: 98.3 F (36.8 C) (!) 97.5 F (36.4 C) 97.6 F (36.4 C)   TempSrc: Oral Oral Oral   SpO2: 100% 100% 100%   PainSc: 0-No pain 0-No pain 0-No pain 0-No pain    Intake/Output Summary (Last 24 hours) at 08/31/2020 1456 Last data filed at 08/31/2020 1404 Gross per 24 hour  Intake 625 ml  Output 1050 ml  Net -425 ml   There were no vitals filed for this visit. Weight change:   Intake/Output from previous day: 05/25 0701 - 05/26 0700 In: 100 [IV Piggyback:100] Out: -  Intake/Output this shift: Total I/O In: 525 [P.O.:200; Blood:325] Out: 7371 [Urine:1050] There were no vitals filed for this visit.  Examination: General  exam: AAO x3, no asterixis tremors,older than stated age, weak appearing. HEENT:Oral mucosa moist, Ear/Nose WNL grossly,dentition normal. Respiratory system: bilaterally diminished, without wheezing or crackles no use of accessory muscle, non tender. Cardiovascular system: S1 & S2 +,No JVD. Gastrointestinal system: Abdomen soft, moderately distended NT,ND, BS+. Nervous System:Alert, awake, moving extremities Extremities: Bilateral 2+ ankle pitting edema, distal peripheral pulses palpable.  Skin: No rashes,no icterus. MSK: Normal muscle bulk,tone, power  Data Reviewed: I have personally reviewed following labs and imaging studies CBC: Recent Labs  Lab 08/30/20 1544 08/31/20 0146  WBC 5.2 4.2  NEUTROABS 4.1  --   HGB 7.9* 6.6*  HCT 26.8* 21.3*  MCV 89.3 86.6  PLT 149* 062*   Basic Metabolic Panel: Recent Labs  Lab 08/30/20 1544 08/30/20 2330 08/31/20 0146  NA 134*  --  134*  K 5.2*  --  5.3*  CL 102  --  102  CO2 22  --  20*  GLUCOSE 108*  --  126*  BUN 57*  --  56*  CREATININE 9.68*  --  9.34*  CALCIUM 5.7*  --  5.8*  MG  --  1.0* 0.9*  PHOS  --   --  8.6*   GFR: CrCl cannot be calculated (Unknown ideal weight.). Liver Function Tests: Recent Labs  Lab 08/30/20 1544 08/31/20 0146  AST 27  --   ALT 16  --   ALKPHOS 89  --   BILITOT 0.8  --   PROT 6.5  --   ALBUMIN 2.8* 2.3*   No results for input(s): LIPASE, AMYLASE in the last 168 hours. No results for input(s): AMMONIA in the last 168 hours. Coagulation Profile: No results for input(s): INR, PROTIME in the last 168 hours. Cardiac Enzymes: No results for input(s): CKTOTAL, CKMB, CKMBINDEX, TROPONINI in the last 168 hours. BNP (last 3 results) No results for input(s): PROBNP in the last 8760 hours. HbA1C: No results for input(s): HGBA1C in the last 72 hours. CBG: No results for input(s): GLUCAP in the last 168 hours. Lipid Profile: No results for input(s): CHOL, HDL, LDLCALC, TRIG, CHOLHDL, LDLDIRECT in  the last 72 hours. Thyroid Function Tests: No results for input(s): TSH, T4TOTAL, FREET4, T3FREE, THYROIDAB in the last 72 hours. Anemia Panel: Recent Labs    08/30/20 2238 08/31/20 0146  VITAMINB12 607  --   FOLATE  --  7.7  FERRITIN 293  --   TIBC 204*  --   IRON 32  --   RETICCTPCT  --  1.0   Sepsis Labs: No results for input(s): PROCALCITON, LATICACIDVEN in the last 168 hours.  Recent Results (from the past 240 hour(s))  Resp Panel by RT-PCR (Flu A&B, Covid) Nasopharyngeal Swab     Status: None   Collection Time: 08/30/20  7:29 PM   Specimen: Nasopharyngeal Swab; Nasopharyngeal(NP) swabs in vial transport medium  Result Value Ref Range Status   SARS Coronavirus 2 by RT PCR NEGATIVE NEGATIVE Final    Comment: (NOTE) SARS-CoV-2 target nucleic acids are NOT DETECTED.  The SARS-CoV-2 RNA is generally detectable in upper respiratory specimens during the acute phase of infection. The lowest concentration of SARS-CoV-2 viral copies this assay can detect is 138 copies/mL. A negative result does not preclude SARS-Cov-2 infection and should not be used as the sole basis for treatment or other patient management decisions. A negative result may occur with  improper specimen collection/handling, submission of specimen other than nasopharyngeal swab, presence of viral mutation(s) within the areas targeted by this assay, and inadequate number of viral copies(<138 copies/mL). A negative result must be combined with clinical observations, patient history, and epidemiological information. The expected result is Negative.  Fact Sheet for Patients:  EntrepreneurPulse.com.au  Fact Sheet for Healthcare Providers:  IncredibleEmployment.be  This test is no t yet approved or cleared by the [place] FDA and  has been authorized for detection and/or diagnosis of SARS-CoV-2 by FDA under an Emergency Use Authorization (EUA). This EUA will remain  in  effect (meaning this test can be used) for the duration of the COVID-19 declaration under Section 564(b)(1) of the Act, 21 U.S.C.section 360bbb-3(b)(1), unless the authorization is terminated  or revoked sooner.       Influenza A by PCR NEGATIVE NEGATIVE Final   Influenza B by PCR NEGATIVE NEGATIVE Final    Comment: (NOTE) The Xpert Xpress SARS-CoV-2/FLU/RSV plus assay is intended as an aid in the diagnosis of influenza from Nasopharyngeal swab specimens and should not be used as a sole basis for treatment. Nasal washings and aspirates are unacceptable for Xpert Xpress SARS-CoV-2/FLU/RSV testing.  Fact Sheet for Patients: EntrepreneurPulse.com.au  Fact Sheet for Healthcare Providers: IncredibleEmployment.be  This test is not yet approved or cleared by the [place] FDA and has been authorized for detection and/or diagnosis of SARS-CoV-2 by FDA under an Emergency Use Authorization (EUA). This EUA will remain in effect (meaning this test can be used) for the duration of the COVID-19 declaration under Section 564(b)(1) of the Act, 21 U.S.C. section 360bbb-3(b)(1), unless the authorization is terminated or revoked.  Performed at [facility], [place] [address] [place], [place] [address]      Radiology Studies: DG Chest 2 View  Result Date: 08/30/2020 CLINICAL DATA:  Fluid overload and peripheral swelling, initial encounter EXAM: CHEST - 2 VIEW COMPARISON:  None. FINDINGS: Cardiac shadow is within normal limits. The lungs are well aerated bilaterally. Right hemidiaphragm is elevated. Right-sided chest port is noted with catheter tip at cavoatrial junction. No acute bony abnormality is noted. IMPRESSION: No active cardiopulmonary disease. Electronically Signed   By: Inez Catalina M.D.   On:  08/30/2020 16:19   US Renal  Result Date: 08/30/2020 CLINICAL DATA:  Acute renal insufficiency EXAM: RENAL / URINARY TRACT ULTRASOUND COMPLETE  COMPARISON:  08/10/2020 FINDINGS: Right Kidney: Renal measurements: 8.4 x 4.0 x 4.1 cm = volume: 71 mL. Renal cortical thickness is preserved. Cortical echogenicity is diffusely increased, similar to prior examination. No hydronephrosis. No intrarenal masses or calcifications are seen. Left Kidney: Renal measurements: 8.4 x 4.1 x 3.7 cm = volume: 66 mL. Renal cortical thickness is preserved. Cortical echogenicity is diffusely increased, similar to prior examination. No hydronephrosis. No intrarenal masses or calcifications. Bladder: Appears normal for degree of bladder distention. Other: There is moderate ascites noted within the abdomen. The hepatic echotexture appears mildly coarsened in keeping with changes of underlying cirrhosis, though this is not well assessed on this exam. IMPRESSION: Increased renal cortical echogenicity, similar to prior examination, in keeping with changes of underlying medical renal disease. Moderate ascites. Electronically Signed   By: Fidela Salisbury MD   On: 08/30/2020 21:28     LOS: 1 day   Antonieta Pert, MD [facility]  08/31/2020, 2:56 PM

## 2020-09-01 ENCOUNTER — Encounter (HOSPITAL_COMMUNITY)
Admission: EM | Disposition: A | Discharge status: Home / Self Care | Payer: Self-pay | Source: Home / Self Care | Attending: Internal Medicine

## 2020-09-01 ENCOUNTER — Inpatient Hospital Stay (HOSPITAL_COMMUNITY): Payer: Medicare Other | Admitting: Anesthesiology

## 2020-09-01 ENCOUNTER — Encounter (HOSPITAL_COMMUNITY): Payer: Self-pay | Admitting: Family Medicine

## 2020-09-01 ENCOUNTER — Inpatient Hospital Stay (HOSPITAL_COMMUNITY): Payer: Medicare Other

## 2020-09-01 DIAGNOSIS — N186 End stage renal disease: Secondary | ICD-10-CM

## 2020-09-01 DIAGNOSIS — N179 Acute kidney failure, unspecified: Secondary | ICD-10-CM | POA: Diagnosis not present

## 2020-09-01 DIAGNOSIS — N185 Chronic kidney disease, stage 5: Secondary | ICD-10-CM | POA: Diagnosis not present

## 2020-09-01 HISTORY — PX: INSERTION OF DIALYSIS CATHETER: SHX1324

## 2020-09-01 HISTORY — PX: ULTRASOUND GUIDANCE FOR VASCULAR ACCESS: SHX6516

## 2020-09-01 HISTORY — PX: AV FISTULA PLACEMENT: SHX1204

## 2020-09-01 LAB — CBC
HCT: 23.5 % — ABNORMAL LOW (ref 36.0–46.0)
Hemoglobin: 7.3 g/dL — ABNORMAL LOW (ref 12.0–15.0)
MCH: 26.5 pg (ref 26.0–34.0)
MCHC: 31.1 g/dL (ref 30.0–36.0)
MCV: 85.5 fL (ref 80.0–100.0)
Platelets: 146 10*3/uL — ABNORMAL LOW (ref 150–400)
RBC: 2.75 MIL/uL — ABNORMAL LOW (ref 3.87–5.11)
RDW: 16.1 % — ABNORMAL HIGH (ref 11.5–15.5)
WBC: 4.3 10*3/uL (ref 4.0–10.5)
nRBC: 0 % (ref 0.0–0.2)

## 2020-09-01 LAB — RENAL FUNCTION PANEL
Albumin: 2.3 g/dL — ABNORMAL LOW (ref 3.5–5.0)
Anion gap: 11 (ref 5–15)
BUN: 57 mg/dL — ABNORMAL HIGH (ref 8–23)
CO2: 22 mmol/L (ref 22–32)
Calcium: 6.5 mg/dL — ABNORMAL LOW (ref 8.9–10.3)
Chloride: 103 mmol/L (ref 98–111)
Creatinine, Ser: 8.83 mg/dL — ABNORMAL HIGH (ref 0.44–1.00)
GFR, Estimated: 5 mL/min — ABNORMAL LOW (ref >60)
Glucose, Bld: 80 mg/dL (ref 70–99)
Phosphorus: 8.7 mg/dL — ABNORMAL HIGH (ref 2.5–4.6)
Potassium: 5.1 mmol/L (ref 3.5–5.1)
Sodium: 136 mmol/L (ref 135–145)

## 2020-09-01 LAB — TYPE AND SCREEN
ABO/RH(D): O POS
Antibody Screen: NEGATIVE
Unit division: 0

## 2020-09-01 LAB — HEPATITIS B CORE ANTIBODY, TOTAL: Hep B Core Total Ab: NONREACTIVE

## 2020-09-01 LAB — BPAM RBC
Blood Product Expiration Date: 202205282359
ISSUE DATE / TIME: 202205261008
Unit Type and Rh: 5100

## 2020-09-01 LAB — MICROALBUMIN / CREATININE URINE RATIO
Creatinine, Urine: 44.8 mg/dL
Microalb Creat Ratio: 214 mg/g creat — ABNORMAL HIGH (ref 0–29)
Microalb, Ur: 95.9 ug/mL — ABNORMAL HIGH

## 2020-09-01 LAB — PROTIME-INR
INR: 1.1 (ref 0.8–1.2)
Prothrombin Time: 14.4 seconds (ref 11.4–15.2)

## 2020-09-01 LAB — HEPATITIS B SURFACE ANTIBODY,QUALITATIVE: Hep B S Ab: REACTIVE — AB

## 2020-09-01 LAB — MAGNESIUM: Magnesium: 1.9 mg/dL (ref 1.7–2.4)

## 2020-09-01 LAB — HEPATITIS B SURFACE ANTIGEN: Hepatitis B Surface Ag: NONREACTIVE

## 2020-09-01 LAB — PARATHYROID HORMONE, INTACT (NO CA): PTH: 222 pg/mL — ABNORMAL HIGH (ref 15–65)

## 2020-09-01 SURGERY — ARTERIOVENOUS (AV) FISTULA CREATION
Anesthesia: General | Site: Chest | Laterality: Right

## 2020-09-01 MED ORDER — ONDANSETRON HCL 4 MG/2ML IJ SOLN
INTRAMUSCULAR | Status: AC
  Filled 2020-09-01: qty 4

## 2020-09-01 MED ORDER — PROPOFOL 10 MG/ML IV BOLUS
INTRAVENOUS | Status: DC | PRN
  Administered 2020-09-01: 100 mg via INTRAVENOUS

## 2020-09-01 MED ORDER — PENTAFLUOROPROP-TETRAFLUOROETH EX AERO: 1.0000 "application " | INHALATION_SPRAY | CUTANEOUS | Status: DC | PRN

## 2020-09-01 MED ORDER — CHLORHEXIDINE GLUCONATE 0.12 % MT SOLN
15.0000 mL | OROMUCOSAL | Status: AC
  Administered 2020-09-01: 15 mL via OROMUCOSAL
  Filled 2020-09-01 (×2): qty 15

## 2020-09-01 MED ORDER — SODIUM CHLORIDE 0.9 % IV SOLN
INTRAVENOUS | Status: DC | PRN
  Administered 2020-09-01: 500 mL

## 2020-09-01 MED ORDER — ALTEPLASE 2 MG IJ SOLR: 2.0000 mg | Freq: Once | INTRAMUSCULAR | Status: DC | PRN

## 2020-09-01 MED ORDER — SUCCINYLCHOLINE CHLORIDE 200 MG/10ML IV SOSY
PREFILLED_SYRINGE | INTRAVENOUS | Status: AC
  Filled 2020-09-01: qty 10

## 2020-09-01 MED ORDER — PHENYLEPHRINE HCL-NACL 10-0.9 MG/250ML-% IV SOLN
INTRAVENOUS | Status: DC | PRN
  Administered 2020-09-01: 25 ug/min via INTRAVENOUS

## 2020-09-01 MED ORDER — MIDAZOLAM HCL 2 MG/2ML IJ SOLN
INTRAMUSCULAR | Status: AC
  Filled 2020-09-01: qty 2

## 2020-09-01 MED ORDER — LIDOCAINE HCL (PF) 1 % IJ SOLN
INTRAMUSCULAR | Status: AC
  Filled 2020-09-01: qty 30

## 2020-09-01 MED ORDER — FENTANYL CITRATE (PF) 250 MCG/5ML IJ SOLN
INTRAMUSCULAR | Status: DC | PRN
  Administered 2020-09-01 (×5): 50 ug via INTRAVENOUS

## 2020-09-01 MED ORDER — HEPARIN SODIUM (PORCINE) 1000 UNIT/ML IJ SOLN
INTRAMUSCULAR | Status: AC
  Filled 2020-09-01: qty 1

## 2020-09-01 MED ORDER — 0.9 % SODIUM CHLORIDE (POUR BTL) OPTIME
TOPICAL | Status: DC | PRN
  Administered 2020-09-01: 1000 mL

## 2020-09-01 MED ORDER — SODIUM CHLORIDE 0.9 % IV SOLN: 100.0000 mL | INTRAVENOUS | Status: DC | PRN

## 2020-09-01 MED ORDER — HEPARIN SODIUM (PORCINE) 1000 UNIT/ML IJ SOLN
INTRAMUSCULAR | Status: DC | PRN
  Administered 2020-09-01: 3000 [IU] via INTRAVENOUS

## 2020-09-01 MED ORDER — ROCURONIUM BROMIDE 10 MG/ML (PF) SYRINGE
PREFILLED_SYRINGE | INTRAVENOUS | Status: AC
  Filled 2020-09-01: qty 10

## 2020-09-01 MED ORDER — PROPOFOL 10 MG/ML IV BOLUS
INTRAVENOUS | Status: AC
  Filled 2020-09-01: qty 20

## 2020-09-01 MED ORDER — SUCCINYLCHOLINE CHLORIDE 200 MG/10ML IV SOSY
PREFILLED_SYRINGE | INTRAVENOUS | Status: DC | PRN
  Administered 2020-09-01: 100 mg via INTRAVENOUS

## 2020-09-01 MED ORDER — LIDOCAINE HCL (PF) 1 % IJ SOLN: 5.0000 mL | INTRAMUSCULAR | Status: DC | PRN

## 2020-09-01 MED ORDER — ROCURONIUM BROMIDE 10 MG/ML (PF) SYRINGE
PREFILLED_SYRINGE | INTRAVENOUS | Status: DC | PRN
  Administered 2020-09-01: 30 mg via INTRAVENOUS
  Administered 2020-09-01: 20 mg via INTRAVENOUS

## 2020-09-01 MED ORDER — LIDOCAINE-PRILOCAINE 2.5-2.5 % EX CREA: 1.0000 "application " | TOPICAL_CREAM | CUTANEOUS | Status: DC | PRN

## 2020-09-01 MED ORDER — DEXAMETHASONE SODIUM PHOSPHATE 10 MG/ML IJ SOLN
INTRAMUSCULAR | Status: AC
  Filled 2020-09-01: qty 2

## 2020-09-01 MED ORDER — OXYCODONE-ACETAMINOPHEN 5-325 MG PO TABS: 1.0000 | ORAL_TABLET | ORAL | Status: DC | PRN

## 2020-09-01 MED ORDER — FENTANYL CITRATE (PF) 250 MCG/5ML IJ SOLN
INTRAMUSCULAR | Status: AC
  Filled 2020-09-01: qty 5

## 2020-09-01 MED ORDER — FUROSEMIDE 10 MG/ML IJ SOLN
80.0000 mg | Freq: Once | INTRAMUSCULAR | Status: AC
  Administered 2020-09-01: 80 mg via INTRAVENOUS
  Filled 2020-09-01: qty 8

## 2020-09-01 MED ORDER — SODIUM CHLORIDE 0.9 % IV SOLN
INTRAVENOUS | Status: AC
  Filled 2020-09-01: qty 1.2

## 2020-09-01 MED ORDER — CHLORHEXIDINE GLUCONATE CLOTH 2 % EX PADS: 6.0000 | MEDICATED_PAD | Freq: Every day | CUTANEOUS | Status: DC

## 2020-09-01 MED ORDER — HEPARIN SODIUM (PORCINE) 1000 UNIT/ML IJ SOLN
INTRAMUSCULAR | Status: DC | PRN
  Administered 2020-09-01: 4200 [IU] via INTRAVENOUS

## 2020-09-01 MED ORDER — CALCIUM GLUCONATE-NACL 1-0.675 GM/50ML-% IV SOLN
1.0000 g | Freq: Once | INTRAVENOUS | Status: AC
  Administered 2020-09-01: 1000 mg via INTRAVENOUS
  Filled 2020-09-01: qty 50

## 2020-09-01 MED ORDER — ALBUMIN HUMAN 5 % IV SOLN: INTRAVENOUS | Status: DC | PRN

## 2020-09-01 MED ORDER — ONDANSETRON HCL 4 MG/2ML IJ SOLN
INTRAMUSCULAR | Status: DC | PRN
  Administered 2020-09-01: 4 mg via INTRAVENOUS

## 2020-09-01 MED ORDER — SODIUM CHLORIDE 0.9 % IV SOLN: INTRAVENOUS | Status: DC

## 2020-09-01 MED ORDER — LIDOCAINE 2% (20 MG/ML) 5 ML SYRINGE
INTRAMUSCULAR | Status: AC
  Filled 2020-09-01: qty 5

## 2020-09-01 MED ORDER — HEPARIN SODIUM (PORCINE) 1000 UNIT/ML DIALYSIS: 1000.0000 [IU] | INTRAMUSCULAR | Status: DC | PRN

## 2020-09-01 MED ORDER — MIDAZOLAM HCL 2 MG/2ML IJ SOLN
INTRAMUSCULAR | Status: DC | PRN
  Administered 2020-09-01: 2 mg via INTRAVENOUS

## 2020-09-01 MED ORDER — DEXAMETHASONE SODIUM PHOSPHATE 10 MG/ML IJ SOLN
INTRAMUSCULAR | Status: DC | PRN
  Administered 2020-09-01: 8 mg via INTRAVENOUS

## 2020-09-01 MED ORDER — LOPERAMIDE HCL 2 MG PO CAPS
2.0000 mg | ORAL_CAPSULE | ORAL | Status: DC | PRN
  Administered 2020-09-02 – 2020-09-05 (×3): 2 mg via ORAL
  Filled 2020-09-01 (×3): qty 1

## 2020-09-01 SURGICAL SUPPLY — 70 items
ADH SKN CLS APL DERMABOND .7 (GAUZE/BANDAGES/DRESSINGS) ×2
AGENT HMST SPONGE THK3/8 (HEMOSTASIS)
ARMBAND PINK RESTRICT EXTREMIT (MISCELLANEOUS) ×3 IMPLANT
BAG DECANTER FOR FLEXI CONT (MISCELLANEOUS) ×3 IMPLANT
BIOPATCH RED 1 DISK 7.0 (GAUZE/BANDAGES/DRESSINGS) ×3 IMPLANT
BLADE CLIPPER SURG (BLADE) IMPLANT
BRUSH SCRUB EZ PLAIN DRY (MISCELLANEOUS) ×3 IMPLANT
CANISTER SUCT 3000ML PPV (MISCELLANEOUS) ×3 IMPLANT
CATH PALINDROME-P 19CM W/VT (CATHETERS) IMPLANT
CATH PALINDROME-P 23CM W/VT (CATHETERS) IMPLANT
CATH PALINDROME-P 28CM W/VT (CATHETERS) ×3 IMPLANT
CATH STRAIGHT 5FR 65CM (CATHETERS) IMPLANT
CLIP VESOCCLUDE MED 6/CT (CLIP) ×3 IMPLANT
CLIP VESOCCLUDE SM WIDE 6/CT (CLIP) ×3 IMPLANT
COVER PROBE W GEL 5X96 (DRAPES) ×3 IMPLANT
COVER SURGICAL LIGHT HANDLE (MISCELLANEOUS) ×3 IMPLANT
COVER WAND RF STERILE (DRAPES) IMPLANT
DECANTER SPIKE VIAL GLASS SM (MISCELLANEOUS) ×3 IMPLANT
DERMABOND ADVANCED (GAUZE/BANDAGES/DRESSINGS) ×1
DERMABOND ADVANCED .7 DNX12 (GAUZE/BANDAGES/DRESSINGS) ×2 IMPLANT
DRAPE C-ARM 42X72 X-RAY (DRAPES) ×3 IMPLANT
DRAPE CHEST BREAST 15X10 FENES (DRAPES) ×3 IMPLANT
DRAPE ORTHO SPLIT 77X108 STRL (DRAPES) ×3
DRAPE SURG ORHT 6 SPLT 77X108 (DRAPES) ×2 IMPLANT
DRSG COVADERM 4X6 (GAUZE/BANDAGES/DRESSINGS) ×3 IMPLANT
ELECT REM PT RETURN 9FT ADLT (ELECTROSURGICAL) ×3
ELECTRODE REM PT RTRN 9FT ADLT (ELECTROSURGICAL) ×2 IMPLANT
GAUZE 4X4 16PLY RFD (DISPOSABLE) ×3 IMPLANT
GAUZE SPONGE 4X4 12PLY STRL (GAUZE/BANDAGES/DRESSINGS) ×3 IMPLANT
GLOVE BIO SURGEON STRL SZ7.5 (GLOVE) ×3 IMPLANT
GLOVE SRG 8 PF TXTR STRL LF DI (GLOVE) ×4 IMPLANT
GLOVE SURG LTX SZ6.5 (GLOVE) ×3 IMPLANT
GLOVE SURG NEOP MICRO LF SZ6.5 (GLOVE) ×3 IMPLANT
GLOVE SURG PR MICRO ENCORE 7.5 (GLOVE) ×3 IMPLANT
GLOVE SURG UNDER POLY LF SZ6.5 (GLOVE) ×3 IMPLANT
GLOVE SURG UNDER POLY LF SZ7 (GLOVE) ×3 IMPLANT
GLOVE SURG UNDER POLY LF SZ8 (GLOVE) ×6
GOWN STRL REUS W/ TWL LRG LVL3 (GOWN DISPOSABLE) ×6 IMPLANT
GOWN STRL REUS W/ TWL XL LVL3 (GOWN DISPOSABLE) ×6 IMPLANT
GOWN STRL REUS W/TWL LRG LVL3 (GOWN DISPOSABLE) ×9
GOWN STRL REUS W/TWL XL LVL3 (GOWN DISPOSABLE) ×9
HEMOSTAT SPONGE AVITENE ULTRA (HEMOSTASIS) IMPLANT
KIT BASIN OR (CUSTOM PROCEDURE TRAY) ×3 IMPLANT
KIT PALINDROME-P 55CM (CATHETERS) IMPLANT
KIT TURNOVER KIT B (KITS) ×3 IMPLANT
NEEDLE 18GX1X1/2 (RX/OR ONLY) (NEEDLE) ×6 IMPLANT
NEEDLE HYPO 25GX1X1/2 BEV (NEEDLE) ×3 IMPLANT
NS IRRIG 1000ML POUR BTL (IV SOLUTION) ×3 IMPLANT
PACK CV ACCESS (CUSTOM PROCEDURE TRAY) ×3 IMPLANT
PACK SURGICAL SETUP 50X90 (CUSTOM PROCEDURE TRAY) ×3 IMPLANT
PAD ARMBOARD 7.5X6 YLW CONV (MISCELLANEOUS) ×6 IMPLANT
SET MICROPUNCTURE 5F STIFF (MISCELLANEOUS) IMPLANT
SOAP 2 % CHG 4 OZ (WOUND CARE) ×3 IMPLANT
SUT ETHILON 3 0 PS 1 (SUTURE) ×3 IMPLANT
SUT MNCRL AB 4-0 PS2 18 (SUTURE) ×3 IMPLANT
SUT PROLENE 6 0 BV (SUTURE) ×6 IMPLANT
SUT PROLENE 7 0 BV 1 (SUTURE) IMPLANT
SUT VIC AB 3-0 SH 27 (SUTURE) ×3
SUT VIC AB 3-0 SH 27X BRD (SUTURE) ×2 IMPLANT
SYR 10ML LL (SYRINGE) ×3 IMPLANT
SYR 20ML LL LF (SYRINGE) ×6 IMPLANT
SYR 3ML LL SCALE MARK (SYRINGE) ×3 IMPLANT
SYR 5ML LL (SYRINGE) ×6 IMPLANT
SYR CONTROL 10ML LL (SYRINGE) ×3 IMPLANT
TOWEL GREEN STERILE (TOWEL DISPOSABLE) ×3 IMPLANT
TOWEL GREEN STERILE FF (TOWEL DISPOSABLE) ×3 IMPLANT
UNDERPAD 30X36 HEAVY ABSORB (UNDERPADS AND DIAPERS) ×3 IMPLANT
WATER STERILE IRR 1000ML POUR (IV SOLUTION) ×3 IMPLANT
WIRE AMPLATZ SS-J .035X180CM (WIRE) IMPLANT
WIRE STARTER BENTSON 035X150 (WIRE) ×3 IMPLANT

## 2020-09-01 NOTE — Plan of Care (Signed)
  Problem: Education: Goal: Knowledge of General Education information will improve Description: Including pain rating scale, medication(s)/side effects and non-pharmacologic comfort measures Outcome: Progressing   Problem: Nutrition: Goal: Adequate nutrition will be maintained Outcome: Progressing   Problem: Elimination: Goal: Will not experience complications related to bowel motility Outcome: Progressing   Problem: Pain Managment: Goal: General experience of comfort will improve Outcome: Progressing   Problem: Safety: Goal: Ability to remain free from injury will improve Outcome: Progressing

## 2020-09-01 NOTE — Progress Notes (Signed)
[facility].   Objective 135/81 98 98.4 F (36.9 C) (Oral) 16 97%  Intake/Output Summary (Last 24 hours) at 09/01/2020 0844 Last data filed at 09/01/2020 0100 Gross per 24 hour  Intake 692.01 ml  Output 750 ml  Net -57.99 ml    Right chest port. Palpable radial pulses.  Laboratory Lab Results: Recent Labs    08/31/20 0146 08/31/20 1646 09/01/20 0428  WBC 4.2  --  4.3  HGB 6.6* 7.5* 7.3*  HCT 21.3* 23.9* 23.5*  PLT 122*  --  146*   BMET Recent Labs    08/31/20 0146 09/01/20 0428  NA 134* 136  K 5.3* 5.1  CL 102 103  CO2 20* 22  GLUCOSE 126* 80  BUN 56* 57*  CREATININE 9.34* 8.83*  CALCIUM 5.8* 6.5*    COAG Lab Results  Component Value Date   INR 1.1 09/01/2020   INR 1.0 12/30/2018   INR 1.14 11/06/2017   No results found for: PTT  Assessment/Planning:  Discussed plan for [facility] and right arm AV fistula versus graft.  Patient is left-handed.  Risk benefits discussed  Marty Heck 09/01/2020 8:44 AM --

## 2020-09-01 NOTE — Progress Notes (Signed)
Referral submitted to [facility] to request clinic closest to patient's home who is able to accommodate a new patient. Navigator will follow up with patient at a later time as she is currently in the OR.   Alphonzo Cruise, [facility] Navigator [phone]

## 2020-09-01 NOTE — Anesthesia Postprocedure Evaluation (Signed)
Anesthesia Post Note  Patient: Klare Criss  Procedure(s) Performed: RIGHT ARM ARTERIOVENOUS 1st STAGE BASILIC (Right ) INSERTION OF A LEFT INTERNAL JUGULAR TUNNELLED DIALYSIS CATHETER. ULTRASOUND GUIDED (Left Chest) ULTRASOUND GUIDANCE FOR VASCULAR ACCESS (Left Chest)     Patient location during evaluation: PACU Anesthesia Type: General Level of consciousness: awake and alert Pain management: pain level controlled Vital Signs Assessment: post-procedure vital signs reviewed and stable Respiratory status: spontaneous breathing, nonlabored ventilation, respiratory function stable and patient connected to nasal cannula oxygen Cardiovascular status: blood pressure returned to baseline and stable Postop Assessment: no apparent nausea or vomiting Anesthetic complications: no   No complications documented.  Last Vitals:  Vitals:   09/01/20 1233 09/01/20 1450  BP: (!) 142/85 (!) 175/94  Pulse: 88 (!) 104  Resp: 16 18  Temp: 36.6 C   SpO2: 94% 97%    Last Pain:  Vitals:   09/01/20 1448  TempSrc:   PainSc: 0-No pain                 Effie Berkshire

## 2020-09-01 NOTE — Anesthesia Preprocedure Evaluation (Addendum)
Anesthesia Evaluation  Patient identified by MRN, date of birth, ID band Patient awake    Reviewed: Allergy & Precautions, NPO status , Patient's Chart, lab work & pertinent test results  Airway Mallampati: II  TM Distance: >3 FB Neck ROM: Full    Dental  (+) Teeth Intact, Dental Advisory Given   Pulmonary    breath sounds clear to auscultation       Cardiovascular hypertension, Pt. on medications  Rhythm:Regular Rate:Normal     Neuro/Psych negative neurological ROS  negative psych ROS   GI/Hepatic GERD  Medicated,(+) Cirrhosis   Esophageal Varices and ascites    ,   Endo/Other  negative endocrine ROS  Renal/GU ESRFRenal disease     Musculoskeletal negative musculoskeletal ROS (+)   Abdominal Normal abdominal exam  (+)   Peds  Hematology negative hematology ROS (+)   Anesthesia Other Findings   Reproductive/Obstetrics                            Anesthesia Physical Anesthesia Plan  ASA: IV  Anesthesia Plan: General   Post-op Pain Management:    Induction: Intravenous  PONV Risk Score and Plan: 3 and Ondansetron, Dexamethasone and Midazolam  Airway Management Planned: Oral ETT  Additional Equipment: None  Intra-op Plan:   Post-operative Plan: Extubation in OR  Informed Consent: I have reviewed the patients History and Physical, chart, labs and discussed the procedure including the risks, benefits and alternatives for the proposed anesthesia with the patient or authorized representative who has indicated his/her understanding and acceptance.     Dental advisory given  Plan Discussed with: CRNA  Anesthesia Plan Comments:        Anesthesia Quick Evaluation

## 2020-09-01 NOTE — Anesthesia Procedure Notes (Signed)
Procedure Name: Intubation Date/Time: 09/01/2020 9:21 AM Performed by: Mariea Clonts, CRNA Pre-anesthesia Checklist: Patient identified, Emergency Drugs available, Suction available and Patient being monitored Patient Re-evaluated:Patient Re-evaluated prior to induction Oxygen Delivery Method: Circle System Utilized Preoxygenation: Pre-oxygenation with 100% oxygen Induction Type: IV induction Laryngoscope Size: Miller and 2 Grade View: Grade I Tube type: Oral Tube size: 7.0 mm Number of attempts: 1 Airway Equipment and Method: Stylet and Oral airway Placement Confirmation: ETT inserted through vocal cords under direct vision,  positive ETCO2 and breath sounds checked- equal and bilateral Tube secured with: Tape Dental Injury: Teeth and Oropharynx as per pre-operative assessment

## 2020-09-01 NOTE — Op Note (Addendum)
OPERATIVE NOTE   PROCEDURE: 1. US guided access left internal jugular vein 2. Left internal jugular vein tunneled dialysis catheter (28 cm Palindrome) 3. 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  Ultimately I could get a Bentson wire to advance into the right atrium and a 28 cm tunneled palindrome catheter was placed in the left IJ with the tip in the right atrium and this flushed and aspirated easily.  Then turned our attention to the right arm where a right first stage basilic vein fistula was made.  Palpable thrill and palpable radial pulse at the wrist at completion.  SPECIMEN(S):  none  INDICATIONS:   Alicia [name] is a 65 y.o. female who presents with stage 5 CKD progressing to ESRD.  The patient is scheduled for [facility] placement and a right arm AVF vs AVG.  The patient is aware the risks include but are not limited to: bleeding, infection, steal syndrome, nerve damage, ischemic monomelic neuropathy, failure to mature, and need for additional procedures.  The patient is aware of the risks of the procedure and elects to proceed forward.  An assistant was needed for exposure and to expedite the case.   DESCRIPTION: After full informed written consent was obtained from the patient, the patient was brought back to the operating room and placed supine upon the operating table.  Prior to induction, the patient received IV antibiotics.   After obtaining adequate anesthesia, the patient was then prepped and draped in the standard fashion for a left neck tunneled dialysis  catheter and a right arm AV fistula.  Initially evaluated the left internal jugular vein with ultrasound.  She had multiple collaterals in her neck and ultimately I accessed the largest branch.  I could not get a J-wire to advance.  I then placed a Bentson wire and this advanced down into the SVC and right atrium.  That point in time, I measured a 28 cm palindrome catheter on the chest wall.  A counterincision was made on the left chest for the exit site of the catheter.  I then tunneled from the exit site to the IJ stick site making sure to bury the cuff under the skin.  I then dilated sequentially over the wire and placed a large dilator peel-away sheath in the left IJ into the right atrium.  We then advanced the catheter into the sheath and the sheath was peeled away placing the tip of the catheter in the right atrium.  Catheter was then pulled back to prevent ectopy.  It flushed and aspirated easily.  It was then secured to the chest with 3-0 nylon.  The IJ stick site was closed with 4-0 Monocryl.  Dermabond was applied.  It was loaded according to manufacturer's recommendations.  We then turned attention to the right arm.  Using SonoSite guidance, I evaluated her surface veins and only a basilic vein looked usable.  I made a transverse incision at the level of the antecubitum and dissected through the subcutaneous tissue and fascia to gain exposure of the brachial artery.  This was noted to be 4 mm in diameter externally.  This was dissected out proximally  and distally and controlled with vessel loops .  I then dissected out the basilic vein.  This was noted to be 3.5 mm in diameter externally.  The distal segment of the vein was ligated with a  2-0 silk, and the vein was transected.  The proximal segment was interrogated with serial dilators.  The vein accepted up to a 4 mm dilator without any difficulty.  I then instilled the heparinized saline into the vein and clamped it.  At this point, I reset my  exposure of the brachial artery.  The patient was given 3,000 units IV heparin.  I then placed the artery under tension proximally and distally.  I made an arteriotomy with a #11 blade, and then I extended the arteriotomy with a Potts scissor.  I injected heparinized saline proximal and distal to this arteriotomy.  The vein was then sewn to the artery in an end-to-side configuration with a running stitch of 6-0 Prolene.  Prior to completing this anastomosis, I allowed the vein and artery to backbleed.  There was no evidence of clot from any vessels.  I completed the anastomosis in the usual fashion and then released all vessel loops and clamps.    There was a palpable thrill in the venous outflow, and there was a palpable radial pulse.  At this point, I irrigated out the surgical wound.  There was no further active bleeding.  The subcutaneous tissue was reapproximated with a running stitch of 3-0 Vicryl.  The skin was then reapproximated with a running subcuticular stitch of 4-0 Monocryl.  The skin was then cleaned, dried, and reinforced with Dermabond.  The patient tolerated this procedure well.   COMPLICATIONS: None  CONDITION: Stable  Marty Heck MD [facility] [place] Office: [place]   09/01/2020, 11:05 AM

## 2020-09-01 NOTE — Plan of Care (Signed)

## 2020-09-01 NOTE — Transfer of Care (Signed)
Immediate Anesthesia Transfer of Care Note  Patient: Alicia [name]  Procedure(s) Performed: RIGHT ARM ARTERIOVENOUS 1st STAGE BASILIC (Right ) INSERTION OF A LEFT INTERNAL JUGULAR TUNNELLED DIALYSIS CATHETER. ULTRASOUND GUIDED (Left Chest) ULTRASOUND GUIDANCE FOR VASCULAR ACCESS (Left Chest)  Patient Location: PACU  Anesthesia Type:General  Level of Consciousness: awake, alert  and oriented  Airway & Oxygen Therapy: Patient Spontanous Breathing and Patient connected to nasal cannula oxygen  Post-op Assessment: Report given to RN and Post -op Vital signs reviewed and stable  Post vital signs: Reviewed and stable  Last Vitals:  Vitals Value Taken Time  BP 147/89 09/01/20 1112  Temp    Pulse 91 09/01/20 1116  Resp 17 09/01/20 1116  SpO2 98 % 09/01/20 1116  Vitals shown include unvalidated device data.  Last Pain:  Vitals:   09/01/20 0732  TempSrc:   PainSc: 0-No pain         Complications: No complications documented.

## 2020-09-01 NOTE — Progress Notes (Signed)
[facility] Progress Note  Name: Alicia [name] MRN: [id number] DOB: Jun 17, 1955    Subjective:  Patient had 1.4 liters UOP and 3 unmeasured urine voids on 5/26.  Had the AVF and tunn catheter placement this am.   Review of systems:   Denies shortness of breath or chest pain No nausea no vomiting  Hasn't eaten today.  --------------- Background on consult:  65 year old unfortunate African-American woman with past medical history significant for recurrent metastatic breast cancer, hypertension, alcoholic cirrhosis with recurrent ascites needing paracentesis every 2 weeks, portal hypertension, anemia of chronic disease, gastroesophageal reflux disease and chronic kidney disease stage V (creatinine 5.6-6.3 at baseline).  She was referred to the emergency room by her primary care provider [name], [place], [name]) who obtained labs yesterday that showed an acute rise of her creatinine to 9.6 with hyperkalemia of 5.6, hypocalcemia of 6.0 and metabolic acidosis with a bicarbonate of 18.  She complains of progressively worsening lower extremity edema, increasing abdominal distention in spite of 4.3 L paracentesis last week and easy fatigability with some cramping of the lower extremities.  She denies any nausea or vomiting but reports poor appetite with some GERD type symptoms.  She denies any chest pain or shortness of breath at rest and denies orthopnea.  She denies any dysuria, urgency, frequency, flank pain, fever, chills or hematuria.  She is supposed to have been taking furosemide 60 mg daily along with spironolactone 25 mg daily.  She has had discussions with Dr. [name] (her outpatient nephrologist at [facility]) and has made the decision to pursue renal replacement therapy.  She was seen last month by [name] of vascular surgery for dialysis access planning and was found to have marginal veins with a potential target for right BBF which she declined to schedule  at the time.  We had a lengthy discussion today regarding her renal function and the possibility that she may need to initiate dialysis of which she is accepting    Intake/Output Summary (Last 24 hours) at 09/01/2020 1255 Last data filed at 09/01/2020 1100 Gross per 24 hour  Intake 1217.01 ml  Output 500 ml  Net 717.01 ml    Vitals:  Vitals:   09/01/20 1130 09/01/20 1145 09/01/20 1215 09/01/20 1233  BP: 138/87 (!) 141/85 128/83 (!) 142/85  Pulse: 88 89 89 88  Resp: 15 19 16 16   Temp:  98 F (36.7 C) 98.2 F (36.8 C) 97.8 F (36.6 C)  TempSrc:    Oral  SpO2: 95% 95% 97% 94%  Weight:      Height:         Physical Exam:  General adult female in bed in no acute distress HEENT normocephalic atraumatic extraocular movements intact sclera anicteric Neck supple trachea midline Lungs clear to auscultation bilaterally normal work of breathing at rest  Heart S1S2 no rub Abdomen soft nontender distended Extremities 1+ edema lower extremities Psych normal mood and affect Neuro - alert and oriented x 3 provides hx and follows commands  Medications reviewed   Labs:  BMP Latest Ref Rng & Units 09/01/2020 08/31/2020 08/30/2020  Glucose 70 - 99 mg/dL 80 126(H) 108(H)  BUN 8 - 23 mg/dL 57(H) 56(H) 57(H)  Creatinine 0.44 - 1.00 mg/dL 8.83(H) 9.34(H) 9.68(H)  Sodium 135 - 145 mmol/L 136 134(L) 134(L)  Potassium 3.5 - 5.1 mmol/L 5.1 5.3(H) 5.2(H)  Chloride 98 - 111 mmol/L 103 102 102  CO2 22 - 32 mmol/L 22 20(L) 22  Calcium 8.9 -  10.3 mg/dL 6.5(L) 5.8(LL) 5.7(LL)     Assessment/Plan:   1. CKD stage V with progression to ESRD - s/p tunneled catheter and first stage brachiobasilic AVF on 4/46 with vascular - HD today.  Plan for HD on 5/28 as well  - HD SW is looking for outpatient HD placement - CLIP started - lasix 80 mg IV once today and intermittently  2.  Hyperkalemia: Mild, likely exacerbated by worsening of renal insufficiency.  Discontinue spironolactone indefinitely at this  time and attempt diuresis with intravenous furosemide  3.  Hypocalcemia: Corrected calcium remains low.  Calcium gluconate once now.  PTH pending. On calcitriol.  We are starting HD  4. Metabolic bone disease - Continue calcitriol 0.5 mcg daily. PTH 222. Started renvela for hyperphosphatemia  5.  Non-anion gap metabolic acidosis: Secondary to progressive chronic kidney disease.  Stop bicarb and manage with HD  6.  Anemia of chronic disease: No evidence of overt loss although this remains under close monitoring with history of portal hypertension and esophageal varices. feraheme once on 5/26. PRBC's on 5/26.  with malignancy defer ESA at this time.  (recurrent breast cancer)  7. Cirrhosis with ascites - requires frequent paracenteses. Stop spironolactone   [name], MD 09/01/2020 1:11 PM

## 2020-09-01 NOTE — Progress Notes (Signed)
Outpatient HD referral in process. Still awaiting HepB labs during first HD treatment for completion of referral. I will follow up on Tuesday--I anticipate a TTS schedule at [facility].  Alphonzo Cruise, [place] Renal Navigator [phone]

## 2020-09-01 NOTE — Discharge Instructions (Signed)
[facility]  Discharge Instructions  AV Fistula or Graft Surgery for Dialysis Access  Please refer to the following instructions for your post-procedure care. Your surgeon or physician assistant will discuss any changes with you.  Activity  You may drive the day following your surgery, if you are comfortable and no longer taking prescription pain medication. Resume full activity as the soreness in your incision resolves.  Bathing/Showering  You may shower after you go home. Keep your incision dry for 48 hours. Do not soak in a bathtub, hot tub, or swim until the incision heals completely. You may not shower if you have a hemodialysis catheter.  Incision Care  Clean your incision with mild soap and water after 48 hours. Pat the area dry with a clean towel. You do not need a bandage unless otherwise instructed. Do not apply any ointments or creams to your incision. You may have skin glue on your incision. Do not peel it off. It will come off on its own in about one week. Your arm may swell a bit after surgery. To reduce swelling use pillows to elevate your arm so it is above your heart. Your doctor will tell you if you need to lightly wrap your arm with an ACE bandage.  Diet  Resume your normal diet. There are not special food restrictions following this procedure. In order to heal from your surgery, it is CRITICAL to get adequate nutrition. Your body requires vitamins, minerals, and protein. Vegetables are the best source of vitamins and minerals. Vegetables also provide the perfect balance of protein. Processed food has little nutritional value, so try to avoid this.  Medications  Resume taking all of your medications. If your incision is causing pain, you may take over-the counter pain relievers such as acetaminophen (Tylenol). If you were prescribed a stronger pain medication, please be aware these medications can cause nausea and constipation. Prevent  nausea by taking the medication with a snack or meal. Avoid constipation by drinking plenty of fluids and eating foods with high amount of fiber, such as fruits, vegetables, and grains.  Do not take Tylenol if you are taking prescription pain medications.  Follow up Your surgeon may want to see you in the office following your access surgery. If so, this will be arranged at the time of your surgery.  Please call us immediately for any of the following conditions:  . Increased pain, redness, drainage (pus) from your incision site . Fever of 101 degrees or higher . Severe or worsening pain at your incision site . Hand pain or numbness. .  Reduce your risk of vascular disease:  . Stop smoking. If you would like help, call [facility] at 1-800-QUIT-NOW 8436178683) or [place] at [phone]  . Manage your cholesterol . Maintain a desired weight . Control your diabetes . Keep your blood pressure down  Dialysis  It will take several weeks to several months for your new dialysis access to be ready for use. Your surgeon will determine when it is okay to use it. Your nephrologist will continue to direct your dialysis. You can continue to use your Permcath until your new access is ready for use.   09/01/2020 Alicia [name] [id number] 1955-04-17  Surgeon(s): Marty Heck, MD  Procedure(s): RIGHT ARM ARTERIOVENOUS (AV) FISTULA CREATION VERSUS GRAFT PLACEMENT INSERTION OF A LEFT INTERNAL JUGULAR TUNNELLED DIALYSIS CATHETER. ULTRASOUND GUIDED ULTRASOUND GUIDANCE FOR VASCULAR ACCESS   May stick graft immediately   May stick graft  on designated area only:   X Do not stick right AV fistula for 12 weeks    If you have any questions, please call the office at [phone].

## 2020-09-01 NOTE — Progress Notes (Signed)
PROGRESS NOTE    Alicia [name]  EXN:[id number] DOB: 09-17-1955 DOA: 08/30/2020 PCP: Mindi Curling, PA-C   Chief Complaint  Patient presents with  . Abnormal Lab  Brief Narrative: 65 year old female with history of alcoholic liver cirrhosis with ascites, chronic kidney disease, hypertension, metastatic breast cancer, chronic anemia presented for evaluation of worsening renal function on outpatient blood work.  Patient reports she has been having diarrhea approximately a month ago that started once he started taking Imodium.  Also having loss of appetite roughly 1 month but has not improved.  Denied vomiting.  She required paracentesis roughly once a month most recently 5/17.  She reports she had blood transfusion in [place] previous week and reports she has not been drinking alcohol for at least 2 weeks She has been having swelling, recently saw vascular surgery in April for right basilic vein fistula but has not been scheduled yet. In the ED, afebrile, saturating well on room air, and with stable blood pressure.  EKG features sinus rhythm with QTc interval 501 ms.  Chest x-ray negative for acute cardiopulmonary disease.  Chemistry panel notable for potassium 5.2, normal bicarbonate, BUN 57, creatinine 9.68, albumin 2.8, and calcium 5.7.  Hemoglobin is 7.9 with normal MCV and platelets 149,000.  Nephrology was consulted by the ED physician and the patient was treated with 80 mg IV Lasix, IV calcium, and oral calcium  Subjective: Seen this morning in recovery.  She is alert awake has no complaint.patient underwent right arm fistula and TDC. Overnight hemoglobin improved after transfusion.  Afebrile.  Assessment & Plan:  Acute kidney injury on CKD stage V: Nephrology on board . suspecting hemodynamically mediated AKI on the background of third spacing in the setting of cirrhosis, elevated blood pressure.  On Lasix 80 mg twice daily.  This morning underwent TDC and right arm fistula and  planning for hemodialysis. Continue plan as per nephrology.  Recent Labs  Lab 08/30/20 1544 08/31/20 0146 09/01/20 0428  BUN 57* 56* 57*  CREATININE 9.68* 9.34* 8.83*   Non-anion gap metabolic acidosis in the setting of progressive CKD monitor on oral bicarb.  Adjust with dialysis Hyperkalemia with AKI Aldactone discontinued.  Potassium 5.1 Recent Labs  Lab 08/30/20 1544 08/31/20 0146 09/01/20 0428  K 5.2* 5.3* 5.1   Alcoholic cirrhosis of liver with ascites, with moderate ascites last paracentesis 5/17 and gets monthly paracentesis.  Patient had abdominal distention but not worse from previous.  Adjust fluid with dialysis.  Monitor for encephalopathy.    Anemia of chronic disease no overt blood loss.  Hemoglobin downtrending monitor and transfuse if less than 7 g status post 1 unit PRBC 5/26. History of portal hypertension and esophageal varices. Patient reports she has had blood transfusion past week in [place]. Recent Labs  Lab 08/30/20 1544 08/31/20 0146 08/31/20 1646 09/01/20 0428  HGB 7.9* 6.6* 7.5* 7.3*  HCT 26.8* 21.3* 23.9* 23.5*   Hypocalcemia status post IV calcium [place] in the ED.  Potassium has improved. Cont calcium supplementation follow-up PTH and phosphorus.   HTN:BP appears controlled continue hydralazine and Norvasc off Aldactone  Metastatic breast cancer, follows at oncology at [facility] in [place] had been on Taxol under November treatment now positive worsening on function.  Will need to follow-up with oncology upon discharge  Prolonged QT interval: 501 ms on admission: Continue QT prolonging medication monitor calcium mag phos and normalize. Monitor ekg. Ordered for am  Diet Order            Diet  NPO time specified Except for: Sips with Meds  Diet effective midnight               Patient's Body mass index is 32.03 kg/m. DVT prophylaxis: heparin injection 5,000 Units Start: 09/01/20 2200 Code Status:   Code Status: Full Code  Family  Communication: plan of care discussed with patient at bedside.  Status is: Inpatient Remains inpatient appropriate because:IV treatments appropriate due to intensity of illness or inability to take PO and Inpatient level of care appropriate due to severity of illness  Dispo: The patient is from: Home              Anticipated d/c is to: Home              Patient currently is not medically stable to d/c.   Difficult to place patient No Unresulted Labs (From admission, onward)          Start     Ordered   08/31/20 0500  CBC  Daily,   R      08/30/20 1953   08/31/20 0500  Renal function panel  Daily,   R      08/30/20 2104   08/30/20 1940  Microalbumin / creatinine urine ratio  Once,   STAT        08/30/20 1939   Signed and Held  Hepatitis B surface antigen  (New Patient [facility] (Hepatitis B))  Once,   R       Question:  Specimen collection method  Answer:  IV Team=IV Team collect   Signed and Held   Signed and Held  Hepatitis B surface antibody  (New Patient [facility] (Hepatitis B))  Once,   R       Question:  Specimen collection method  Answer:  IV Team=IV Team collect   Signed and Held   Signed and Held  Hepatitis B core antibody, total  (New Patient [facility] (Hepatitis B))  Once,   R       Question:  Specimen collection method  Answer:  IV Team=IV Team collect   Signed and Held         Medications reviewed:  Scheduled Meds: . [MAR Hold] amLODipine  10 mg Oral Daily  . [MAR Hold] calcitRIOL  0.5 mcg Oral Daily  . [MAR Hold] calcium carbonate  1,000 mg Oral BID  . [MAR Hold] Chlorhexidine Gluconate Cloth  6 each Topical Daily  . [MAR Hold] Chlorhexidine Gluconate Cloth  6 each Topical Q0600  . [MAR Hold] furosemide  80 mg Intravenous BID  . [MAR Hold] heparin  5,000 Units Subcutaneous Q8H  . [MAR Hold] hydrALAZINE  100 mg Oral TID  . [MAR Hold] kidney failure book   Does not apply Once  . [MAR Hold] sevelamer carbonate  800 mg Oral TID WC  . [MAR Hold] sodium bicarbonate   650 mg Oral TID   Continuous Infusions: . sodium chloride      Consultants:see note  Procedures:see note  Antimicrobials: Anti-infectives (From admission, onward)   Start     Dose/Rate Route Frequency Ordered Stop   09/01/20 1500  ceFAZolin (ANCEF) IVPB 2g/100 mL premix        2 g 200 mL/hr over 30 Minutes Intravenous To Radiology 08/31/20 1500 09/01/20 1000     Culture/Microbiology    Component Value Date/Time   SDES PERITONEAL 06/05/2017 [place] 06/05/2017 1117   [place] 06/05/2017 1117   [place] NONE 06/05/2017 1117  CULT STREPTOCOCCUS MITIS/ORALIS (A) 06/05/2017 1117   REPTSTATUS 06/09/2017 FINAL 06/05/2017 1117   REPTSTATUS 06/05/2017 FINAL 06/05/2017 1117    Other culture-see note  Objective: Vitals: Today's Vitals   09/01/20 0732 09/01/20 0733 09/01/20 1112 09/01/20 1130  BP:   (!) 147/89 138/87  Pulse:   94 88  Resp:   13 15  Temp:   97.6 F (36.4 C)   TempSrc:      SpO2:   100% 95%  Weight:  74.4 kg    Height:  5' (1.524 m)    PainSc: 0-No pain       Intake/Output Summary (Last 24 hours) at 09/01/2020 1143 Last data filed at 09/01/2020 1100 Gross per 24 hour  Intake 1542.01 ml  Output 800 ml  Net 742.01 ml   Filed Weights   09/01/20 0733  Weight: 74.4 kg   Weight change:   Intake/Output from previous day: 05/26 0701 - 05/27 0700 In: 692 [P.O.:200; Blood:325; IV Piggyback:167] Out: 1350 [Urine:1350] Intake/Output this shift: Total I/O In: 850 [I.V.:500; IV Piggyback:350] Out: 50 [Blood:50] Filed Weights   09/01/20 0733  Weight: 74.4 kg    Examination: General exam: AAOx 3, elderly, frail HEENT:Oral mucosa moist, Ear/Nose WNL grossly, dentition normal. Respiratory system: bilaterally diminished, no use of accessory muscle Cardiovascular system: S1 & S2 +, No JVD,. Gastrointestinal system: Abdomen soft, moderately distended NT,ND, BS+ Nervous System:Alert, awake, moving extremities and grossly  nonfocal Extremities: Bilateral pitting edema, distal peripheral pulses palpable.  Skin: No rashes,no icterus. Rt arm w/ AVF. TDC+ on chest. MSK: thin muscle bulk,tone, power  Data Reviewed: I have personally reviewed following labs and imaging studies CBC: Recent Labs  Lab 08/30/20 1544 08/31/20 0146 08/31/20 1646 09/01/20 0428  WBC 5.2 4.2  --  4.3  NEUTROABS 4.1  --   --   --   HGB 7.9* 6.6* 7.5* 7.3*  HCT 26.8* 21.3* 23.9* 23.5*  MCV 89.3 86.6  --  85.5  PLT 149* 122*  --  242*   Basic Metabolic Panel: Recent Labs  Lab 08/30/20 1544 08/30/20 2330 08/31/20 0146 09/01/20 0428  NA 134*  --  134* 136  K 5.2*  --  5.3* 5.1  CL 102  --  102 103  CO2 22  --  20* 22  GLUCOSE 108*  --  126* 80  BUN 57*  --  56* 57*  CREATININE 9.68*  --  9.34* 8.83*  CALCIUM 5.7*  --  5.8* 6.5*  MG  --  1.0* 0.9* 1.9  PHOS  --   --  8.6* 8.7*   GFR: Estimated Creatinine Clearance: 5.9 mL/min (A) (by C-G formula based on SCr of 8.83 mg/dL (H)). Liver Function Tests: Recent Labs  Lab 08/30/20 1544 08/31/20 0146 09/01/20 0428  [name] 27  --   --   ALT 16  --   --   ALKPHOS 89  --   --   BILITOT 0.8  --   --   PROT 6.5  --   --   ALBUMIN 2.8* 2.3* 2.3*   No results for input(s): LIPASE, AMYLASE in the last 168 hours. No results for input(s): AMMONIA in the last 168 hours. Coagulation Profile: Recent Labs  Lab 09/01/20 0428  INR 1.1   Cardiac Enzymes: No results for input(s): CKTOTAL, CKMB, CKMBINDEX, TROPONINI in the last 168 hours. BNP (last 3 results) No results for input(s): PROBNP in the last 8760 hours. HbA1C: No results for input(s): HGBA1C in the last  72 hours. CBG: No results for input(s): GLUCAP in the last 168 hours. Lipid Profile: No results for input(s): CHOL, HDL, LDLCALC, TRIG, CHOLHDL, LDLDIRECT in the last 72 hours. Thyroid Function Tests: No results for input(s): TSH, T4TOTAL, FREET4, T3FREE, THYROIDAB in the last 72 hours. Anemia Panel: Recent Labs     08/30/20 2238 08/31/20 0146  VITAMINB12 607  --   FOLATE  --  7.7  FERRITIN 293  --   TIBC 204*  --   IRON 32  --   RETICCTPCT  --  1.0   Sepsis Labs: No results for input(s): PROCALCITON, LATICACIDVEN in the last 168 hours.  Recent Results (from the past 240 hour(s))  Resp Panel by RT-PCR (Flu A&B, Covid) Nasopharyngeal Swab     Status: None   Collection Time: 08/30/20  7:29 PM   Specimen: Nasopharyngeal Swab; Nasopharyngeal(NP) swabs in vial transport medium  Result Value Ref Range Status   SARS Coronavirus 2 by RT PCR NEGATIVE NEGATIVE Final    Comment: (NOTE) SARS-CoV-2 target nucleic acids are NOT DETECTED.  The SARS-CoV-2 RNA is generally detectable in upper respiratory specimens during the acute phase of infection. The lowest concentration of SARS-CoV-2 viral copies this assay can detect is 138 copies/mL. A negative result does not preclude SARS-Cov-2 infection and should not be used as the sole basis for treatment or other patient management decisions. A negative result may occur with  improper specimen collection/handling, submission of specimen other than nasopharyngeal swab, presence of viral mutation(s) within the areas targeted by this assay, and inadequate number of viral copies(<138 copies/mL). A negative result must be combined with clinical observations, patient history, and epidemiological information. The expected result is Negative.  Fact Sheet for Patients:  EntrepreneurPulse.com.au  Fact Sheet for Healthcare Providers:  IncredibleEmployment.be  This test is no t yet approved or cleared by the [place] FDA and  has been authorized for detection and/or diagnosis of SARS-CoV-2 by FDA under an Emergency Use Authorization (EUA). This EUA will remain  in effect (meaning this test can be used) for the duration of the COVID-19 declaration under Section 564(b)(1) of the Act, 21 U.S.C.section 360bbb-3(b)(1), unless the  authorization is terminated  or revoked sooner.       Influenza A by PCR NEGATIVE NEGATIVE Final   Influenza B by PCR NEGATIVE NEGATIVE Final    Comment: (NOTE) The Xpert Xpress SARS-CoV-2/FLU/RSV plus assay is intended as an aid in the diagnosis of influenza from Nasopharyngeal swab specimens and should not be used as a sole basis for treatment. Nasal washings and aspirates are unacceptable for Xpert Xpress SARS-CoV-2/FLU/RSV testing.  Fact Sheet for Patients: EntrepreneurPulse.com.au  Fact Sheet for Healthcare Providers: IncredibleEmployment.be  This test is not yet approved or cleared by the [place] FDA and has been authorized for detection and/or diagnosis of SARS-CoV-2 by FDA under an Emergency Use Authorization (EUA). This EUA will remain in effect (meaning this test can be used) for the duration of the COVID-19 declaration under Section 564(b)(1) of the Act, 21 U.S.C. section 360bbb-3(b)(1), unless the authorization is terminated or revoked.  Performed at [facility], [place] [address], [place], [place] [address]      Radiology Studies: DG Chest 2 View  Result Date: 08/30/2020 CLINICAL DATA:  Fluid overload and peripheral swelling, initial encounter EXAM: CHEST - 2 VIEW COMPARISON:  None. FINDINGS: Cardiac shadow is within normal limits. The lungs are well aerated bilaterally. Right hemidiaphragm is elevated. Right-sided chest port is noted with catheter  tip at cavoatrial junction. No acute bony abnormality is noted. IMPRESSION: No active cardiopulmonary disease. Electronically Signed   By: Inez Catalina M.D.   On: 08/30/2020 16:19   US Renal  Result Date: 08/30/2020 CLINICAL DATA:  Acute renal insufficiency EXAM: RENAL / URINARY TRACT ULTRASOUND COMPLETE COMPARISON:  08/10/2020 FINDINGS: Right Kidney: Renal measurements: 8.4 x 4.0 x 4.1 cm = volume: 71 mL. Renal cortical thickness is preserved. Cortical echogenicity is  diffusely increased, similar to prior examination. No hydronephrosis. No intrarenal masses or calcifications are seen. Left Kidney: Renal measurements: 8.4 x 4.1 x 3.7 cm = volume: 66 mL. Renal cortical thickness is preserved. Cortical echogenicity is diffusely increased, similar to prior examination. No hydronephrosis. No intrarenal masses or calcifications. Bladder: Appears normal for degree of bladder distention. Other: There is moderate ascites noted within the abdomen. The hepatic echotexture appears mildly coarsened in keeping with changes of underlying cirrhosis, though this is not well assessed on this exam. IMPRESSION: Increased renal cortical echogenicity, similar to prior examination, in keeping with changes of underlying medical renal disease. Moderate ascites. Electronically Signed   By: [place]a Salisbury MD   On: 08/30/2020 21:28   DG Fluoro Guide CV Line-No Report  Result Date: 09/01/2020 Fluoroscopy was utilized by the requesting physician.  No radiographic interpretation.     LOS: 2 days   Antonieta Pert, MD [facility]  09/01/2020, 11:43 AM

## 2020-09-01 NOTE — Progress Notes (Signed)
Nutrition Education Note  RD consulted for Renal Education.   Attempted to speak with pt x 3. At 2 attempts, pt unavailable. On third and final attempt, pt eating lunch and talking on phone. Pt requested that RD return at a later time to talk.   RD provided "Food Pyramid for Healthy Eating with Kidney Disease" handout. Attached to AVS/ discharge instructions.   Body mass index is 32.03 kg/m. Pt meets criteria for obesity, class II based on current BMI.  Current diet order is renal with 1.2 L fluid restriction, patient is consuming approximately 100% of meals at this time. Labs and medications reviewed. No further nutrition interventions warranted at this time. RD contact information provided. If additional nutrition issues arise, please re-consult RD.  Alicia [name], RD, LDN, [place] Registered Dietitian II Certified Diabetes Care and Education Specialist Please refer to [facility] for RD and/or RD on-call/weekend/after hours pager

## 2020-09-02 ENCOUNTER — Encounter (HOSPITAL_COMMUNITY): Payer: Self-pay | Admitting: Vascular Surgery

## 2020-09-02 DIAGNOSIS — Z992 Dependence on renal dialysis: Secondary | ICD-10-CM

## 2020-09-02 DIAGNOSIS — N179 Acute kidney failure, unspecified: Secondary | ICD-10-CM | POA: Diagnosis not present

## 2020-09-02 DIAGNOSIS — K7031 Alcoholic cirrhosis of liver with ascites: Secondary | ICD-10-CM | POA: Diagnosis not present

## 2020-09-02 DIAGNOSIS — N186 End stage renal disease: Secondary | ICD-10-CM

## 2020-09-02 DIAGNOSIS — N049 Nephrotic syndrome with unspecified morphologic changes: Secondary | ICD-10-CM

## 2020-09-02 LAB — CBC
HCT: 24.7 % — ABNORMAL LOW (ref 36.0–46.0)
Hemoglobin: 7.7 g/dL — ABNORMAL LOW (ref 12.0–15.0)
MCH: 26.7 pg (ref 26.0–34.0)
MCHC: 31.2 g/dL (ref 30.0–36.0)
MCV: 85.8 fL (ref 80.0–100.0)
Platelets: 130 10*3/uL — ABNORMAL LOW (ref 150–400)
RBC: 2.88 MIL/uL — ABNORMAL LOW (ref 3.87–5.11)
RDW: 15.8 % — ABNORMAL HIGH (ref 11.5–15.5)
WBC: 7.3 10*3/uL (ref 4.0–10.5)
nRBC: 0 % (ref 0.0–0.2)

## 2020-09-02 LAB — RENAL FUNCTION PANEL
Albumin: 2.6 g/dL — ABNORMAL LOW (ref 3.5–5.0)
Anion gap: 10 (ref 5–15)
BUN: 42 mg/dL — ABNORMAL HIGH (ref 8–23)
CO2: 23 mmol/L (ref 22–32)
Calcium: 7.8 mg/dL — ABNORMAL LOW (ref 8.9–10.3)
Chloride: 101 mmol/L (ref 98–111)
Creatinine, Ser: 6.65 mg/dL — ABNORMAL HIGH (ref 0.44–1.00)
GFR, Estimated: 7 mL/min — ABNORMAL LOW (ref >60)
Glucose, Bld: 115 mg/dL — ABNORMAL HIGH (ref 70–99)
Phosphorus: 6.8 mg/dL — ABNORMAL HIGH (ref 2.5–4.6)
Potassium: 4.6 mmol/L (ref 3.5–5.1)
Sodium: 134 mmol/L — ABNORMAL LOW (ref 135–145)

## 2020-09-02 MED ORDER — ALBUMIN HUMAN 25 % IV SOLN
0.0000 g | Freq: Once | INTRAVENOUS | Status: DC
  Filled 2020-09-02: qty 400

## 2020-09-02 MED ORDER — FUROSEMIDE 10 MG/ML IJ SOLN
80.0000 mg | Freq: Once | INTRAMUSCULAR | Status: AC
  Administered 2020-09-02: 80 mg via INTRAVENOUS
  Filled 2020-09-02: qty 8

## 2020-09-02 MED ORDER — FUROSEMIDE 10 MG/ML IJ SOLN
80.0000 mg | Freq: Once | INTRAMUSCULAR | Status: AC
  Administered 2020-09-03: 80 mg via INTRAVENOUS
  Filled 2020-09-02 (×2): qty 8

## 2020-09-02 NOTE — Progress Notes (Signed)
PROGRESS NOTE    Alicia [name]  GMW:[id number] DOB: 01-04-1956 DOA: 08/30/2020 PCP: Mindi Curling, PA-C   Chief Complaint  Patient presents with  . Abnormal Lab  Brief Narrative: 65 year old female with history of alcoholic liver cirrhosis with ascites that has been drained monthly, last paracentesis pain on 08/23/2018; chronic kidney disease stage V, hypertension, metastatic breast cancer, chronic anemia presented for evaluation of worsening renal function on outpatient blood work.  Apparently, patient had had diarrhea for a month prior to presentation.  Associated loss of appetite, but now improved.  She reports she had blood transfusion in [place] previous week and reports she has not been drinking alcohol for at least 2 weeks.  Likely, patient has progressed to end-stage renal disease, now on hemodialysis.  Patient has had PermCath and AV fistula placed.  Patient is followed up by the vascular surgery team and the nephrology team.  On presentation to the hospital, potassium was 5.2, serum creatinine of 9.68, albumin of 2.8, calcium of 5.7 and hemoglobin of 7.9 g/dL.  Patient has received IV calcium.  09/02/2020: Patient seen on hemodialysis.  Also discussed with the local nephrologist, Dr. Harrie Jeans.  Patient is awaiting outpatient hemodialysis chair.  Significant ascites persists.  We will proceed with paracentesis.  Otherwise, no new complaints.  Subjective: -No shortness of breath. -No chest pain. -No other constitutional symptoms reported.  Assessment & Plan:  CKD 5, now progressed to ESRD:  -Nephrology team is directing care. -PermCath has been placed.  AV fistula has been placed. -Education officer, museum is assisting with hemodialysis chair.   -Due to history of metastatic breast cancer, likely, patient will not be a candidate for combined liver and kidney transplantation. Recent Labs  Lab 08/30/20 1544 08/31/20 0146 09/01/20 0428 09/02/20 0313  BUN 57* 56* 57* 42*   CREATININE 9.68* 9.34* 8.83* 6.65*   Non-anion gap metabolic acidosis: -Likely not a simple metabolic acidosis. -Patient has advanced chronic kidney disease and liver disease.   -CO2 today is 23.    Hyperkalemia -Potassium today is 4.6. -Resolved with renal replacement therapy.   Recent Labs  Lab 08/30/20 1544 08/31/20 0146 09/01/20 0428 09/02/20 0313  K 5.2* 5.3* 5.1 4.6   Alcoholic cirrhosis of liver with ascites: -We will proceed with paracentesis.  Patient has significant ascites.    Anemia of chronic disease: -Multifactorial. -Patient has chronic kidney disease. -Patient has advanced liver disease.   -We will defer management to the nephrology team.  Recent Labs  Lab 08/30/20 1544 08/31/20 0146 08/31/20 1646 09/01/20 0428 09/02/20 0313  HGB 7.9* 6.6* 7.5* 7.3* 7.7*  HCT 26.8* 21.3* 23.9* 23.5* 24.7*   Hypocalcemia: -Nephrology team is managing.    HTN: -Continue to optimize. -Goal blood pressure should be less than or equal to 130/80 mmHg.   Metastatic breast cancer: -Follows at oncology at [facility] in [place] Order            Diet renal with fluid restriction Fluid restriction: 1200 mL Fluid; Room service appropriate? Yes; Fluid consistency: Thin  Diet effective now               Patient's Body mass index is 33.24 kg/m. DVT prophylaxis: heparin injection 5,000 Units Start: 09/01/20 2200 Code Status:   Code Status: Full Code  Family Communication: plan of care discussed with patient at bedside.  Status is: Inpatient Remains inpatient appropriate because:IV treatments appropriate due to intensity of illness or inability to take PO and Inpatient level of  care appropriate due to severity of illness  Dispo: The patient is from: Home              Anticipated d/c is to: Home              Patient currently is not medically stable to d/c.   Difficult to place patient No Unresulted Labs (From admission, onward)          Start     Ordered    08/31/20 0500  Renal function panel  Daily,   R      08/30/20 2104         Medications reviewed:  Scheduled Meds: . amLODipine  10 mg Oral Daily  . calcitRIOL  0.5 mcg Oral Daily  . calcium carbonate  1,000 mg Oral BID  . Chlorhexidine Gluconate Cloth  6 each Topical Daily  . Chlorhexidine Gluconate Cloth  6 each Topical Q0600  . furosemide  80 mg Intravenous Once  . heparin  5,000 Units Subcutaneous Q8H  . hydrALAZINE  100 mg Oral TID  . kidney failure book   Does not apply Once  . sevelamer carbonate  800 mg Oral TID WC   Continuous Infusions: . sodium chloride      Consultants:see note  Procedures:see note  Antimicrobials: Anti-infectives (From admission, onward)   Start     Dose/Rate Route Frequency Ordered Stop   09/01/20 1500  ceFAZolin (ANCEF) IVPB 2g/100 mL premix        2 g 200 mL/hr over 30 Minutes Intravenous To Radiology 08/31/20 1500 09/01/20 1000     Culture/Microbiology    Component Value Date/Time   SDES PERITONEAL 06/05/2017 [place] 06/05/2017 1117   [place] 06/05/2017 1117   [place] 06/05/2017 1117   CULT STREPTOCOCCUS MITIS/ORALIS (A) 06/05/2017 1117   REPTSTATUS 06/09/2017 FINAL 06/05/2017 1117   REPTSTATUS 06/05/2017 FINAL 06/05/2017 1117    Other culture-see note  Objective: Vitals: Today's Vitals   09/02/20 1021 09/02/20 1030 09/02/20 1100 09/02/20 1130  BP: (!) (P) 148/84 (!) (P) 150/85 (P) 136/78 (P) 111/61  Pulse: (P) 98 (P) 96 (P) 100 (!) (P) 104  Resp:      Temp:      TempSrc:      SpO2:      Weight:      Height:      PainSc:        Intake/Output Summary (Last 24 hours) at 09/02/2020 1234 Last data filed at 09/02/2020 0721 Gross per 24 hour  Intake 290 ml  Output 2000 ml  Net -1710 ml   Filed Weights   09/01/20 0733 09/01/20 1543 09/01/20 1801  Weight: 74.4 kg 77.7 kg 77.2 kg   Weight change:   Intake/Output from previous day: 05/27 0701 - 05/28 0700 In: 900 [I.V.:500; IV  Piggyback:400] Out: 1450 [Urine:900; Blood:50] Intake/Output this shift: Total I/O In: 240 [P.O.:240] Out: 600 [Urine:600] Filed Weights   09/01/20 0733 09/01/20 1543 09/01/20 1801  Weight: 74.4 kg 77.7 kg 77.2 kg    Examination: General exam: AAOx 3, elderly, frail HEENT: Patient is pale.   Respiratory system: Clear to auscultation. Cardiovascular system: S1 & S2  Gastrointestinal system: Abdomen is distended (ascites).  Organs are difficult to assess.   Nervous System: Awake and alert.  Patient moves all extremities.   Extremities: 2+ bilateral lower extremity edema.    Data Reviewed: I have personally reviewed following labs and imaging studies CBC: Recent Labs  Lab 08/30/20 1544  08/31/20 0146 08/31/20 1646 09/01/20 0428 09/02/20 0313  WBC 5.2 4.2  --  4.3 7.3  NEUTROABS 4.1  --   --   --   --   HGB 7.9* 6.6* 7.5* 7.3* 7.7*  HCT 26.8* 21.3* 23.9* 23.5* 24.7*  MCV 89.3 86.6  --  85.5 85.8  PLT 149* 122*  --  146* 528*   Basic Metabolic Panel: Recent Labs  Lab 08/30/20 1544 08/30/20 2330 08/31/20 0146 09/01/20 0428 09/02/20 0313  NA 134*  --  134* 136 134*  K 5.2*  --  5.3* 5.1 4.6  CL 102  --  102 103 101  CO2 22  --  20* 22 23  GLUCOSE 108*  --  126* 80 115*  BUN 57*  --  56* 57* 42*  CREATININE 9.68*  --  9.34* 8.83* 6.65*  CALCIUM 5.7*  --  5.8* 6.5* 7.8*  MG  --  1.0* 0.9* 1.9  --   PHOS  --   --  8.6* 8.7* 6.8*   GFR: Estimated Creatinine Clearance: 8 mL/min (A) (by C-G formula based on SCr of 6.65 mg/dL (H)). Liver Function Tests: Recent Labs  Lab 08/30/20 1544 08/31/20 0146 09/01/20 0428 09/02/20 0313  AST 27  --   --   --   ALT 16  --   --   --   ALKPHOS 89  --   --   --   BILITOT 0.8  --   --   --   PROT 6.5  --   --   --   ALBUMIN 2.8* 2.3* 2.3* 2.6*   No results for input(s): LIPASE, AMYLASE in the last 168 hours. No results for input(s): AMMONIA in the last 168 hours. Coagulation Profile: Recent Labs  Lab 09/01/20 0428  INR  1.1   Cardiac Enzymes: No results for input(s): CKTOTAL, CKMB, CKMBINDEX, TROPONINI in the last 168 hours. BNP (last 3 results) No results for input(s): PROBNP in the last 8760 hours. HbA1C: No results for input(s): HGBA1C in the last 72 hours. CBG: No results for input(s): GLUCAP in the last 168 hours. Lipid Profile: No results for input(s): CHOL, HDL, LDLCALC, TRIG, CHOLHDL, LDLDIRECT in the last 72 hours. Thyroid Function Tests: No results for input(s): TSH, T4TOTAL, FREET4, T3FREE, THYROIDAB in the last 72 hours. Anemia Panel: Recent Labs    08/30/20 2238 08/31/20 0146  VITAMINB12 607  --   FOLATE  --  7.7  FERRITIN 293  --   TIBC 204*  --   IRON 32  --   RETICCTPCT  --  1.0   Sepsis Labs: No results for input(s): PROCALCITON, LATICACIDVEN in the last 168 hours.  Recent Results (from the past 240 hour(s))  Resp Panel by RT-PCR (Flu A&B, Covid) Nasopharyngeal Swab     Status: None   Collection Time: 08/30/20  7:29 PM   Specimen: Nasopharyngeal Swab; Nasopharyngeal(NP) swabs in vial transport medium  Result Value Ref Range Status   SARS Coronavirus 2 by RT PCR NEGATIVE NEGATIVE Final    Comment: (NOTE) SARS-CoV-2 target nucleic acids are NOT DETECTED.  The SARS-CoV-2 RNA is generally detectable in upper respiratory specimens during the acute phase of infection. The lowest concentration of SARS-CoV-2 viral copies this assay can detect is 138 copies/mL. A negative result does not preclude SARS-Cov-2 infection and should not be used as the sole basis for treatment or other patient management decisions. A negative result may occur with  improper specimen collection/handling, submission of  specimen other than nasopharyngeal swab, presence of viral mutation(s) within the areas targeted by this assay, and inadequate number of viral copies(<138 copies/mL). A negative result must be combined with clinical observations, patient history, and epidemiological information. The  expected result is Negative.  Fact Sheet for Patients:  EntrepreneurPulse.com.au  Fact Sheet for Healthcare Providers:  IncredibleEmployment.be  This test is no t yet approved or cleared by the [place] FDA and  has been authorized for detection and/or diagnosis of SARS-CoV-2 by FDA under an Emergency Use Authorization (EUA). This EUA will remain  in effect (meaning this test can be used) for the duration of the COVID-19 declaration under Section 564(b)(1) of the Act, 21 U.S.C.section 360bbb-3(b)(1), unless the authorization is terminated  or revoked sooner.       Influenza A by PCR NEGATIVE NEGATIVE Final   Influenza B by PCR NEGATIVE NEGATIVE Final    Comment: (NOTE) The Xpert Xpress SARS-CoV-2/FLU/RSV plus assay is intended as an aid in the diagnosis of influenza from Nasopharyngeal swab specimens and should not be used as a sole basis for treatment. Nasal washings and aspirates are unacceptable for Xpert Xpress SARS-CoV-2/FLU/RSV testing.  Fact Sheet for Patients: EntrepreneurPulse.com.au  Fact Sheet for Healthcare Providers: IncredibleEmployment.be  This test is not yet approved or cleared by the [place] FDA and has been authorized for detection and/or diagnosis of SARS-CoV-2 by FDA under an Emergency Use Authorization (EUA). This EUA will remain in effect (meaning this test can be used) for the duration of the COVID-19 declaration under Section 564(b)(1) of the Act, 21 U.S.C. section 360bbb-3(b)(1), unless the authorization is terminated or revoked.  Performed at [facility], [address] [place], [place] [address]      Radiology Studies: DG Chest Port 1 View  Result Date: 09/01/2020 CLINICAL DATA:  1. US guided access left internal jugular vein 2. Left internal jugular vein tunneled dialysis catheter (28 cm Palindrome) 3. Right first stage basilic vein transposition  (brachiobasilic arteriovenous fistula) placement EXAM: PORTABLE CHEST - 1 VIEW COMPARISON:  none FINDINGS: Interval tunneled left IJ hemodialysis catheter placement to the proximal right atrium. No pneumothorax. Stable right IJ port catheter. Lungs clear. Elevation of the right diaphragmatic leaflet as before. Heart size and mediastinal contours are within normal limits. No effusion. Visualized bones unremarkable. IMPRESSION: 1. Left IJ catheter to the proximal right atrium, no pneumothorax. Electronically Signed   By: Lucrezia Europe M.D.   On: 09/01/2020 14:21   DG Fluoro Guide CV Line-No Report  Result Date: 09/01/2020 Fluoroscopy was utilized by the requesting physician.  No radiographic interpretation.     LOS: 3 days   Bonnell Public, MD [facility]  09/02/2020, 12:34 PM

## 2020-09-02 NOTE — Progress Notes (Signed)
[facility]  Subjective  -states her left IJ dialysis catheter worked well yesterday in dialysis.  No right arm symptoms to suggest steal after fistula placement.   Objective (!) 147/93 (!) 103 98.3 F (36.8 C) (Oral) 17 99%  Intake/Output Summary (Last 24 hours) at 09/02/2020 0906 Last data filed at 09/02/2020 0721 Gross per 24 hour  Intake 1140 ml  Output 2050 ml  Net -910 ml    Left IJ tunneled dialysis catheter clean dry and intact. Right arm first stage basilic vein fistula with good thrill and palpable radial pulse at the wrist with good motor and sensory function in the right hand.  Laboratory Lab Results: Recent Labs    09/01/20 0428 09/02/20 0313  WBC 4.3 7.3  HGB 7.3* 7.7*  HCT 23.5* 24.7*  PLT 146* 130*   BMET Recent Labs    09/01/20 0428 09/02/20 0313  NA 136 134*  K 5.1 4.6  CL 103 101  CO2 22 23  GLUCOSE 80 115*  BUN 57* 42*  CREATININE 8.83* 6.65*  CALCIUM 6.5* 7.8*    COAG Lab Results  Component Value Date   INR 1.1 09/01/2020   INR 1.0 12/30/2018   INR 1.14 11/06/2017   No results found for: PTT  Assessment/Planning:  Postop day 1 status post left IJ tunneled dialysis catheter with a right arm first stage basilic vein fistula.  Overall seems the catheter worked yesterday in dialysis and that she states she is due for another session today.  Her right arm AV fistula has a good thrill.  Discussed I will arrange follow-up in 4 to 6 weeks in the office for fistula duplex and will need second stage transposition assuming this matures appropriately.  We will sign off.  Please call with questions or concerns.  Marty Heck 09/02/2020 9:06 AM --

## 2020-09-02 NOTE — Progress Notes (Signed)
[facility] Progress Note  Name: Alicia [name] MRN: [id number] DOB: February 12, 1956    Subjective:  Seen and examined on dialysis.  Blood pressure 134/78 and HR 104.  Tolerating goal.  tunn catheter in use.  900 ml uop and unmeasured void on 5/27.   Review of systems:   Denies shortness of breath or chest pain No nausea no vomiting  --------------- Background on consult:  65 year old unfortunate African-American woman with past medical history significant for recurrent metastatic breast cancer, hypertension, alcoholic cirrhosis with recurrent ascites needing paracentesis every 2 weeks, portal hypertension, anemia of chronic disease, gastroesophageal reflux disease and chronic kidney disease stage V (creatinine 5.6-6.3 at baseline).  She was referred to the emergency room by her primary care provider [name], [place], [name]) who obtained labs yesterday that showed an acute rise of her creatinine to 9.6 with hyperkalemia of 5.6, hypocalcemia of 6.0 and metabolic acidosis with a bicarbonate of 18.  She complains of progressively worsening lower extremity edema, increasing abdominal distention in spite of 4.3 L paracentesis last week and easy fatigability with some cramping of the lower extremities.  She denies any nausea or vomiting but reports poor appetite with some GERD type symptoms.  She denies any chest pain or shortness of breath at rest and denies orthopnea.  She denies any dysuria, urgency, frequency, flank pain, fever, chills or hematuria.  She is supposed to have been taking furosemide 60 mg daily along with spironolactone 25 mg daily.  She has had discussions with Dr. [name] (her outpatient nephrologist at [facility]) and has made the decision to pursue renal replacement therapy.  She was seen last month by [name] of vascular surgery for dialysis access planning and was found to have marginal veins with a potential target for right BBF which she declined  to schedule at the time.  We had a lengthy discussion today regarding her renal function and the possibility that she may need to initiate dialysis of which she is accepting    Intake/Output Summary (Last 24 hours) at 09/02/2020 1223 Last data filed at 09/02/2020 0721 Gross per 24 hour  Intake 290 ml  Output 2000 ml  Net -1710 ml    Vitals:  Vitals:   09/02/20 1021 09/02/20 1030 09/02/20 1100 09/02/20 1130  BP: (!) (P) 148/84 (!) (P) 150/85 (P) 136/78 (P) 111/61  Pulse: (P) 98 (P) 96 (P) 100 (!) (P) 104  Resp:      Temp:      TempSrc:      SpO2:      Weight:      Height:         Physical Exam:  General adult female in bed in no acute distress HEENT normocephalic atraumatic extraocular movements intact sclera anicteric Neck supple trachea midline Lungs clear to auscultation bilaterally normal work of breathing at rest  Heart S1S2 no rub Abdomen soft nontender distended Extremities 1+ edema lower extremities Psych normal mood and affect Neuro - alert and oriented x 3 provides hx and follows commands  Medications reviewed   Labs:  BMP Latest Ref Rng & Units 09/02/2020 09/01/2020 08/31/2020  Glucose 70 - 99 mg/dL 115(H) 80 126(H)  BUN 8 - 23 mg/dL 42(H) 57(H) 56(H)  Creatinine 0.44 - 1.00 mg/dL 6.65(H) 8.83(H) 9.34(H)  Sodium 135 - 145 mmol/L 134(L) 136 134(L)  Potassium 3.5 - 5.1 mmol/L 4.6 5.1 5.3(H)  Chloride 98 - 111 mmol/L 101 103 102  CO2 22 - 32 mmol/L 23 22 20(L)  Calcium 8.9 - 10.3 mg/dL 7.8(L) 6.5(L) 5.8(LL)     Assessment/Plan:   1. CKD stage V with progression to ESRD - s/p tunneled catheter and first stage brachiobasilic AVF on 6/01 with vascular - HD today.  Plan for next tx on 5/30, Monday  - HD SW is looking for outpatient HD placement - CLIP started - lasix intermittently. Lasix 80 mg IV in am.   2.  Hyperkalemia: Mild, likely exacerbated by worsening of renal insufficiency.  Discontinued spironolactone.  Improved with HD.   3.  Hypocalcemia:  improving with HD.  S/p repletion.  On calcitriol.  On high ca bath.  4. Metabolic bone disease - Continue calcitriol 0.5 mcg daily. PTH 222. Started renvela for hyperphosphatemia  5.  Non-anion gap metabolic acidosis: Secondary to progressive chronic kidney disease.  Manage with HD.  6.  Anemia of chronic disease:  feraheme once on 5/26. PRBC's on 5/26.  with malignancy defer ESA at this time.  (recurrent breast cancer)  7. Cirrhosis with ascites - requires frequent paracenteses. Stopped spironolactone.  Paracenteses per primary team   [name], MD 09/02/2020 12:32 PM

## 2020-09-02 NOTE — Plan of Care (Signed)

## 2020-09-03 DIAGNOSIS — Z992 Dependence on renal dialysis: Secondary | ICD-10-CM | POA: Diagnosis not present

## 2020-09-03 DIAGNOSIS — K7031 Alcoholic cirrhosis of liver with ascites: Secondary | ICD-10-CM | POA: Diagnosis not present

## 2020-09-03 DIAGNOSIS — N186 End stage renal disease: Secondary | ICD-10-CM | POA: Diagnosis not present

## 2020-09-03 DIAGNOSIS — N179 Acute kidney failure, unspecified: Secondary | ICD-10-CM | POA: Diagnosis not present

## 2020-09-03 LAB — RENAL FUNCTION PANEL
Albumin: 2.3 g/dL — ABNORMAL LOW (ref 3.5–5.0)
Anion gap: 8 (ref 5–15)
BUN: 30 mg/dL — ABNORMAL HIGH (ref 8–23)
CO2: 26 mmol/L (ref 22–32)
Calcium: 8.1 mg/dL — ABNORMAL LOW (ref 8.9–10.3)
Chloride: 99 mmol/L (ref 98–111)
Creatinine, Ser: 5.2 mg/dL — ABNORMAL HIGH (ref 0.44–1.00)
GFR, Estimated: 9 mL/min — ABNORMAL LOW (ref >60)
Glucose, Bld: 105 mg/dL — ABNORMAL HIGH (ref 70–99)
Phosphorus: 5.3 mg/dL — ABNORMAL HIGH (ref 2.5–4.6)
Potassium: 3.9 mmol/L (ref 3.5–5.1)
Sodium: 133 mmol/L — ABNORMAL LOW (ref 135–145)

## 2020-09-03 MED ORDER — FUROSEMIDE 10 MG/ML IJ SOLN
80.0000 mg | Freq: Once | INTRAMUSCULAR | Status: AC
  Administered 2020-09-03: 80 mg via INTRAVENOUS
  Filled 2020-09-03: qty 8

## 2020-09-03 MED ORDER — CHLORHEXIDINE GLUCONATE CLOTH 2 % EX PADS
6.0000 | MEDICATED_PAD | Freq: Every day | CUTANEOUS | Status: DC
  Administered 2020-09-04 – 2020-09-05 (×2): 6 via TOPICAL

## 2020-09-03 NOTE — Progress Notes (Signed)
PROGRESS NOTE    Alicia [name]  BSJ:[id number] DOB: March 10, 1956 DOA: 08/30/2020 PCP: Mindi Curling, PA-C   Chief Complaint  Patient presents with  . Abnormal Lab  Brief Narrative: 65 year old female with history of alcoholic liver cirrhosis with ascites that has been drained monthly, last paracentesis pain on 08/23/2018; chronic kidney disease stage V, hypertension, metastatic breast cancer, chronic anemia presented for evaluation of worsening renal function on outpatient blood work.  Apparently, patient had had diarrhea for a month prior to presentation.  Associated loss of appetite, but now improved.  She reports she had blood transfusion in [place] previous week and reports she has not been drinking alcohol for at least 2 weeks.  Likely, patient has progressed to end-stage renal disease, now on hemodialysis.  Patient has had PermCath and AV fistula placed.  Patient is followed up by the vascular surgery team and the nephrology team.  On presentation to the hospital, potassium was 5.2, serum creatinine of 9.68, albumin of 2.8, calcium of 5.7 and hemoglobin of 7.9 g/dL.  Patient has received IV calcium.  09/02/2020: Patient seen on hemodialysis.  Also discussed with the local nephrologist, Dr. Harrie Jeans.  Patient is awaiting outpatient hemodialysis chair.  Significant ascites persists.  We will proceed with paracentesis.  Otherwise, no new complaints.  09/03/2020: Patient seen.  No new complaints.  Awaiting paracentesis.  Nephrology input is highly appreciated.  Subjective: -No shortness of breath. -No chest pain. -No other constitutional symptoms reported.  Assessment & Plan:  CKD 5, now progressed to ESRD:  -Nephrology team is directing care. -PermCath has been placed.  AV fistula has been placed. -Education officer, museum is assisting with hemodialysis chair.   -Due to history of metastatic breast cancer, likely, patient will not be a candidate for combined liver and kidney  transplantation. Recent Labs  Lab 08/30/20 1544 08/31/20 0146 09/01/20 0428 09/02/20 0313 09/03/20 0322  BUN 57* 56* 57* 42* 30*  CREATININE 9.68* 9.34* 8.83* 6.65* 5.20*   Non-anion gap metabolic acidosis: -Resolved. -CO2 is 26 today.  Hyperkalemia -Potassium today is 3.9. -Resolved with renal replacement therapy.   Recent Labs  Lab 08/30/20 1544 08/31/20 0146 09/01/20 0428 09/02/20 0313 09/03/20 0322  K 5.2* 5.3* 5.1 4.6 3.9   Alcoholic cirrhosis of liver with ascites: -Patient is awaiting paracentesis.    Anemia of chronic disease: -Multifactorial. -Patient has chronic kidney disease. -Patient has advanced liver disease.   -We will defer management to the nephrology team.  Recent Labs  Lab 08/30/20 1544 08/31/20 0146 08/31/20 1646 09/01/20 0428 09/02/20 0313  HGB 7.9* 6.6* 7.5* 7.3* 7.7*  HCT 26.8* 21.3* 23.9* 23.5* 24.7*   Hypocalcemia: -Nephrology team is managing.   -Resolved.  HTN: -Continue to optimize. -Goal blood pressure should be less than or equal to 130/80 mmHg.   Metastatic breast cancer: -Follows at oncology at [facility] in [place] Order            Diet renal with fluid restriction Fluid restriction: 1200 mL Fluid; Room service appropriate? Yes; Fluid consistency: Thin  Diet effective now               Patient's Body mass index is 32.51 kg/m. DVT prophylaxis: heparin injection 5,000 Units Start: 09/01/20 2200 Code Status:   Code Status: Full Code  Family Communication: plan of care discussed with patient at bedside.  Status is: Inpatient Remains inpatient appropriate because:IV treatments appropriate due to intensity of illness or inability to take PO and Inpatient level  of care appropriate due to severity of illness  Dispo: The patient is from: Home              Anticipated d/c is to: Home              Patient currently is not medically stable to d/c.   Difficult to place patient No Unresulted Labs (From  admission, onward)          Start     Ordered   09/04/20 0500  CBC  Tomorrow morning,   R       Question:  Specimen collection method  Answer:  IV Team=IV Team collect   09/03/20 1051   09/02/20 1252  Peritoneal Fluid culture ( includes gram stain)  (Bedside Paracentesis)  Once,   R       Question:  Are there also cytology or pathology orders on this specimen?  Answer:  Yes   09/02/20 1251   09/02/20 1252  Peritoneal Fluid cell count with differential  (Bedside Paracentesis)  Once,   R       Question:  Are there also cytology or pathology orders on this specimen?  Answer:  Yes   09/02/20 1251   09/02/20 1252  Protein, Peritoneal Fluid  (Bedside Paracentesis)  Once,   R        09/02/20 1251   09/02/20 1252  Albumin, Peritoneal Fluid  (Bedside Paracentesis)  Once,   R        09/02/20 1251   08/31/20 0500  Renal function panel  Daily,   R      08/30/20 2104         Medications reviewed:  Scheduled Meds: . amLODipine  10 mg Oral Daily  . calcitRIOL  0.5 mcg Oral Daily  . calcium carbonate  1,000 mg Oral BID  . Chlorhexidine Gluconate Cloth  6 each Topical Daily  . Chlorhexidine Gluconate Cloth  6 each Topical Q0600  . heparin  5,000 Units Subcutaneous Q8H  . hydrALAZINE  100 mg Oral TID  . kidney failure book   Does not apply Once  . sevelamer carbonate  800 mg Oral TID WC   Continuous Infusions: . sodium chloride    . albumin human      Consultants:see note  Procedures:see note  Antimicrobials: Anti-infectives (From admission, onward)   Start     Dose/Rate Route Frequency Ordered Stop   09/01/20 1500  ceFAZolin (ANCEF) IVPB 2g/100 mL premix        2 g 200 mL/hr over 30 Minutes Intravenous To Radiology 08/31/20 1500 09/01/20 1000     Culture/Microbiology    Component Value Date/Time   SDES PERITONEAL 06/05/2017 [place] 06/05/2017 1117   [place] 06/05/2017 1117   SPECREQUEST NONE 06/05/2017 1117   CULT STREPTOCOCCUS MITIS/ORALIS (A)  06/05/2017 1117   REPTSTATUS 06/09/2017 FINAL 06/05/2017 1117   REPTSTATUS 06/05/2017 FINAL 06/05/2017 1117    Other culture-see note  Objective: Vitals: Today's Vitals   09/02/20 2015 09/03/20 0402 09/03/20 0816 09/03/20 1400  BP:  132/78 136/82   Pulse:  96 (!) 102   Resp:  15 18   Temp:  98.4 F (36.9 C) 98.4 F (36.9 C)   TempSrc:  Oral Oral   SpO2:  98% 98%   Weight:      Height:      PainSc: 0-No pain   0-No pain    Intake/Output Summary (Last 24 hours) at 09/03/2020 1609 Last data filed  at 09/03/2020 1500 Gross per 24 hour  Intake 480 ml  Output --  Net 480 ml   Filed Weights   09/01/20 1801 09/02/20 1000 09/02/20 1337  Weight: 77.2 kg 76.9 kg 75.5 kg   Weight change: 2.51 kg  Intake/Output from previous day: 05/28 0701 - 05/29 0700 In: 240 [P.O.:240] Out: 1900 [Urine:600] Intake/Output this shift: Total I/O In: 480 [P.O.:480] Out: -  Filed Weights   09/01/20 1801 09/02/20 1000 09/02/20 1337  Weight: 77.2 kg 76.9 kg 75.5 kg    Examination: General exam: AAOx 3, elderly, frail HEENT: Patient is pale.   Respiratory system: Clear to auscultation. Cardiovascular system: S1 & S2  Gastrointestinal system: Abdomen is distended (ascites).  Organs are difficult to assess.   Nervous System: Awake and alert.  Patient moves all extremities.   Extremities: 1+ bilateral lower extremity edema.    Data Reviewed: I have personally reviewed following labs and imaging studies CBC: Recent Labs  Lab 08/30/20 1544 08/31/20 0146 08/31/20 1646 09/01/20 0428 09/02/20 0313  WBC 5.2 4.2  --  4.3 7.3  NEUTROABS 4.1  --   --   --   --   HGB 7.9* 6.6* 7.5* 7.3* 7.7*  HCT 26.8* 21.3* 23.9* 23.5* 24.7*  MCV 89.3 86.6  --  85.5 85.8  PLT 149* 122*  --  146* 563*   Basic Metabolic Panel: Recent Labs  Lab 08/30/20 1544 08/30/20 2330 08/31/20 0146 09/01/20 0428 09/02/20 0313 09/03/20 0322  NA 134*  --  134* 136 134* 133*  K 5.2*  --  5.3* 5.1 4.6 3.9  CL 102  --   102 103 101 99  CO2 22  --  20* 22 23 26   GLUCOSE 108*  --  126* 80 115* 105*  BUN 57*  --  56* 57* 42* 30*  CREATININE 9.68*  --  9.34* 8.83* 6.65* 5.20*  CALCIUM 5.7*  --  5.8* 6.5* 7.8* 8.1*  MG  --  1.0* 0.9* 1.9  --   --   PHOS  --   --  8.6* 8.7* 6.8* 5.3*   GFR: Estimated Creatinine Clearance: 10.1 mL/min (A) (by C-G formula based on SCr of 5.2 mg/dL (H)). Liver Function Tests: Recent Labs  Lab 08/30/20 1544 08/31/20 0146 09/01/20 0428 09/02/20 0313 09/03/20 0322  AST 27  --   --   --   --   ALT 16  --   --   --   --   ALKPHOS 89  --   --   --   --   BILITOT 0.8  --   --   --   --   PROT 6.5  --   --   --   --   ALBUMIN 2.8* 2.3* 2.3* 2.6* 2.3*   No results for input(s): LIPASE, AMYLASE in the last 168 hours. No results for input(s): AMMONIA in the last 168 hours. Coagulation Profile: Recent Labs  Lab 09/01/20 0428  INR 1.1   Cardiac Enzymes: No results for input(s): CKTOTAL, CKMB, CKMBINDEX, TROPONINI in the last 168 hours. BNP (last 3 results) No results for input(s): PROBNP in the last 8760 hours. HbA1C: No results for input(s): HGBA1C in the last 72 hours. CBG: No results for input(s): GLUCAP in the last 168 hours. Lipid Profile: No results for input(s): CHOL, HDL, LDLCALC, TRIG, CHOLHDL, LDLDIRECT in the last 72 hours. Thyroid Function Tests: No results for input(s): TSH, T4TOTAL, FREET4, T3FREE, THYROIDAB in the last 72 hours.  Anemia Panel: No results for input(s): VITAMINB12, FOLATE, FERRITIN, TIBC, IRON, RETICCTPCT in the last 72 hours. Sepsis Labs: No results for input(s): PROCALCITON, LATICACIDVEN in the last 168 hours.  Recent Results (from the past 240 hour(s))  Resp Panel by RT-PCR (Flu A&B, Covid) Nasopharyngeal Swab     Status: None   Collection Time: 08/30/20  7:29 PM   Specimen: Nasopharyngeal Swab; Nasopharyngeal(NP) swabs in vial transport medium  Result Value Ref Range Status   SARS Coronavirus 2 by RT PCR NEGATIVE NEGATIVE Final     Comment: (NOTE) SARS-CoV-2 target nucleic acids are NOT DETECTED.  The SARS-CoV-2 RNA is generally detectable in upper respiratory specimens during the acute phase of infection. The lowest concentration of SARS-CoV-2 viral copies this assay can detect is 138 copies/mL. A negative result does not preclude SARS-Cov-2 infection and should not be used as the sole basis for treatment or other patient management decisions. A negative result may occur with  improper specimen collection/handling, submission of specimen other than nasopharyngeal swab, presence of viral mutation(s) within the areas targeted by this assay, and inadequate number of viral copies(<138 copies/mL). A negative result must be combined with clinical observations, patient history, and epidemiological information. The expected result is Negative.  Fact Sheet for Patients:  EntrepreneurPulse.com.au  Fact Sheet for Healthcare Providers:  IncredibleEmployment.be  This test is no t yet approved or cleared by the [place] FDA and  has been authorized for detection and/or diagnosis of SARS-CoV-2 by FDA under an Emergency Use Authorization (EUA). This EUA will remain  in effect (meaning this test can be used) for the duration of the COVID-19 declaration under Section 564(b)(1) of the Act, 21 U.S.C.section 360bbb-3(b)(1), unless the authorization is terminated  or revoked sooner.       Influenza A by PCR NEGATIVE NEGATIVE Final   Influenza B by PCR NEGATIVE NEGATIVE Final    Comment: (NOTE) The Xpert Xpress SARS-CoV-2/FLU/RSV plus assay is intended as an aid in the diagnosis of influenza from Nasopharyngeal swab specimens and should not be used as a sole basis for treatment. Nasal washings and aspirates are unacceptable for Xpert Xpress SARS-CoV-2/FLU/RSV testing.  Fact Sheet for Patients: EntrepreneurPulse.com.au  Fact Sheet for Healthcare  Providers: IncredibleEmployment.be  This test is not yet approved or cleared by the [place] FDA and has been authorized for detection and/or diagnosis of SARS-CoV-2 by FDA under an Emergency Use Authorization (EUA). This EUA will remain in effect (meaning this test can be used) for the duration of the COVID-19 declaration under Section 564(b)(1) of the Act, 21 U.S.C. section 360bbb-3(b)(1), unless the authorization is terminated or revoked.  Performed at [facility], [place] [address] [place], [place] [address]      Radiology Studies: No results found.   LOS: 4 days   Bonnell Public, MD [facility]  09/03/2020, 4:09 PM

## 2020-09-03 NOTE — Progress Notes (Signed)
[facility] Progress Note  Name: Alicia [name] MRN: [id number] DOB: 10-29-1955    Subjective:  She had last HD on 5/28 with 1.3 kg UF.  She had 0.6 L UOP over 5/28 as well as three unmeasured urine voids.  She states HD went well.  Per incomplete charting appears she had hypotension at end of HD which resolved with mini bolus.    Review of systems:   Denies shortness of breath or chest pain No nausea no vomiting  BM today --------------- Background on consult:  65 year old unfortunate African-American woman with past medical history significant for recurrent metastatic breast cancer, hypertension, alcoholic cirrhosis with recurrent ascites needing paracentesis every 2 weeks, portal hypertension, anemia of chronic disease, gastroesophageal reflux disease and chronic kidney disease stage V (creatinine 5.6-6.3 at baseline).  She was referred to the emergency room by her primary care provider [name], [place], [name]) who obtained labs yesterday that showed an acute rise of her creatinine to 9.6 with hyperkalemia of 5.6, hypocalcemia of 6.0 and metabolic acidosis with a bicarbonate of 18.  She complains of progressively worsening lower extremity edema, increasing abdominal distention in spite of 4.3 L paracentesis last week and easy fatigability with some cramping of the lower extremities.  She denies any nausea or vomiting but reports poor appetite with some GERD type symptoms.  She denies any chest pain or shortness of breath at rest and denies orthopnea.  She denies any dysuria, urgency, frequency, flank pain, fever, chills or hematuria.  She is supposed to have been taking furosemide 60 mg daily along with spironolactone 25 mg daily.  She has had discussions with Dr. [name] (her outpatient nephrologist at [facility]) and has made the decision to pursue renal replacement therapy.  She was seen last month by [name] of vascular surgery for dialysis access  planning and was found to have marginal veins with a potential target for right BBF which she declined to schedule at the time.  We had a lengthy discussion today regarding her renal function and the possibility that she may need to initiate dialysis of which she is accepting    Intake/Output Summary (Last 24 hours) at 09/03/2020 1048 Last data filed at 09/02/2020 1252 Gross per 24 hour  Intake --  Output 1300 ml  Net -1300 ml    Vitals:  Vitals:   09/02/20 1500 09/02/20 1929 09/03/20 0402 09/03/20 0816  BP: 136/83 130/81 132/78 136/82  Pulse: 91 98 96 (!) 102  Resp: 17 16 15 18   Temp: 98.7 F (37.1 C) 97.7 F (36.5 C) 98.4 F (36.9 C) 98.4 F (36.9 C)  TempSrc: Oral Oral Oral Oral  SpO2: 100% 98% 98% 98%  Weight:      Height:         Physical Exam:   General adult female in bed in no acute distress HEENT normocephalic atraumatic extraocular movements intact sclera anicteric Neck supple trachea midline Lungs clear to auscultation bilaterally normal work of breathing at rest  Heart S1S2 no rub Abdomen soft nontender distended Extremities 1-2+ edema lower extremities Psych normal mood and affect Neuro - alert and oriented x 3 provides hx and follows commands Access: Left IJ tunneled dialysis catheter  Medications reviewed   Labs:  BMP Latest Ref Rng & Units 09/03/2020 09/02/2020 09/01/2020  Glucose 70 - 99 mg/dL 105(H) 115(H) 80  BUN 8 - 23 mg/dL 30(H) 42(H) 57(H)  Creatinine 0.44 - 1.00 mg/dL 5.20(H) 6.65(H) 8.83(H)  Sodium 135 - 145 mmol/L  133(L) 134(L) 136  Potassium 3.5 - 5.1 mmol/L 3.9 4.6 5.1  Chloride 98 - 111 mmol/L 99 101 103  CO2 22 - 32 mmol/L 26 23 22   Calcium 8.9 - 10.3 mg/dL 8.1(L) 7.8(L) 6.5(L)     Assessment/Plan:   1. CKD stage V with progression to ESRD - s/p tunneled catheter and first stage brachiobasilic AVF on 8/88 with vascular - next HD on 5/30, Monday  - HD SW is looking for outpatient HD placement - CLIP started - had lasix 80 mg IV  once this AM.  Repeat this afternoon - would give lasix 80 mg PO on non-HD days upon discharge  2.  Hyperkalemia: Mild, likely exacerbated by worsening of renal insufficiency.  Discontinued spironolactone.  Improved with HD and lasix.   3.  Hypocalcemia: improving with HD and s/p repletion.  On calcitriol. On higher Ca bath  4. Metabolic bone disease - Continue calcitriol 0.5 mcg daily. PTH 222. Started renvela for hyperphosphatemia which is improving.   5.  Non-anion gap metabolic acidosis: Secondary to progressive chronic kidney disease.  Manage with HD.  6.  Anemia of chronic disease:  feraheme once on 5/26. PRBC's on 5/26.  with malignancy defer ESA at this time.  (recurrent breast cancer).  CBC in AM  7. Cirrhosis with ascites - requires frequent paracenteses. Stopped spironolactone.  Paracenteses per primary team discretion  Disposition - continue inpatient monitoring; she does not yet have an outpatient HD unit  [name], MD 09/03/2020 10:59 AM

## 2020-09-03 NOTE — Progress Notes (Signed)
Monitor room called bec pt has rhythm similar to A fib with RVR. they called again saying, it looks like multifocal atrial contractions not A fib. MD notified and updated

## 2020-09-04 DIAGNOSIS — N179 Acute kidney failure, unspecified: Secondary | ICD-10-CM | POA: Diagnosis not present

## 2020-09-04 DIAGNOSIS — Z992 Dependence on renal dialysis: Secondary | ICD-10-CM | POA: Diagnosis not present

## 2020-09-04 DIAGNOSIS — N185 Chronic kidney disease, stage 5: Secondary | ICD-10-CM | POA: Diagnosis not present

## 2020-09-04 DIAGNOSIS — N186 End stage renal disease: Secondary | ICD-10-CM | POA: Diagnosis not present

## 2020-09-04 LAB — RENAL FUNCTION PANEL
Albumin: 2.4 g/dL — ABNORMAL LOW (ref 3.5–5.0)
Anion gap: 7 (ref 5–15)
BUN: 32 mg/dL — ABNORMAL HIGH (ref 8–23)
CO2: 25 mmol/L (ref 22–32)
Calcium: 8.1 mg/dL — ABNORMAL LOW (ref 8.9–10.3)
Chloride: 99 mmol/L (ref 98–111)
Creatinine, Ser: 6.05 mg/dL — ABNORMAL HIGH (ref 0.44–1.00)
GFR, Estimated: 7 mL/min — ABNORMAL LOW (ref >60)
Glucose, Bld: 98 mg/dL (ref 70–99)
Phosphorus: 5 mg/dL — ABNORMAL HIGH (ref 2.5–4.6)
Potassium: 3.8 mmol/L (ref 3.5–5.1)
Sodium: 131 mmol/L — ABNORMAL LOW (ref 135–145)

## 2020-09-04 LAB — CBC
HCT: 21.6 % — ABNORMAL LOW (ref 36.0–46.0)
HCT: 22.4 % — ABNORMAL LOW (ref 36.0–46.0)
Hemoglobin: 6.6 g/dL — CL (ref 12.0–15.0)
Hemoglobin: 6.9 g/dL — CL (ref 12.0–15.0)
MCH: 26.9 pg (ref 26.0–34.0)
MCH: 26.7 pg (ref 26.0–34.0)
MCHC: 30.6 g/dL (ref 30.0–36.0)
MCHC: 30.8 g/dL (ref 30.0–36.0)
MCV: 88.2 fL (ref 80.0–100.0)
MCV: 86.8 fL (ref 80.0–100.0)
Platelets: 81 10*3/uL — ABNORMAL LOW (ref 150–400)
Platelets: 59 10*3/uL — ABNORMAL LOW (ref 150–400)
RBC: 2.45 MIL/uL — ABNORMAL LOW (ref 3.87–5.11)
RBC: 2.58 MIL/uL — ABNORMAL LOW (ref 3.87–5.11)
RDW: 15.4 % (ref 11.5–15.5)
RDW: 15.3 % (ref 11.5–15.5)
WBC: 6.4 10*3/uL (ref 4.0–10.5)
WBC: 5.4 10*3/uL (ref 4.0–10.5)
nRBC: 0 % (ref 0.0–0.2)
nRBC: 0 % (ref 0.0–0.2)

## 2020-09-04 LAB — PREPARE RBC (CROSSMATCH)

## 2020-09-04 MED ORDER — FUROSEMIDE 10 MG/ML IJ SOLN
40.0000 mg | Freq: Once | INTRAMUSCULAR | Status: AC
  Administered 2020-09-04: 40 mg via INTRAVENOUS
  Filled 2020-09-04: qty 4

## 2020-09-04 MED ORDER — SODIUM CHLORIDE 0.9% IV SOLUTION: Freq: Once | INTRAVENOUS | Status: DC

## 2020-09-04 MED ORDER — HYDROXYZINE HCL 25 MG PO TABS
25.0000 mg | ORAL_TABLET | Freq: Three times a day (TID) | ORAL | Status: DC | PRN
  Administered 2020-09-04: 25 mg via ORAL
  Filled 2020-09-04: qty 1

## 2020-09-04 NOTE — Progress Notes (Signed)
Started 1unit PRBC, no reactions noted, vital signs recorded, Pt alert and oriented x 4, not in distress. Endorsed accordingly to oncoming RN.

## 2020-09-04 NOTE — Progress Notes (Signed)
PROGRESS NOTE    Alicia [name]  UEA:[id number] DOB: 1956/04/06 DOA: 08/30/2020 PCP: Mindi Curling, PA-C   Chief Complaint  Patient presents with  . Abnormal Lab  Brief Narrative: 65 year old female with history of alcoholic liver cirrhosis with ascites that has been drained monthly, last paracentesis pain on 08/23/2018; chronic kidney disease stage V, hypertension, metastatic breast cancer, chronic anemia presented for evaluation of worsening renal function on outpatient blood work.  Apparently, patient had had diarrhea for a month prior to presentation.  Associated loss of appetite, but now improved.  She reports she had blood transfusion in [place] previous week and reports she has not been drinking alcohol for at least 2 weeks.  Likely, patient has progressed to end-stage renal disease, now on hemodialysis.  Patient has had PermCath and AV fistula placed.  Patient is followed up by the vascular surgery team and the nephrology team.  On presentation to the hospital, potassium was 5.2, serum creatinine of 9.68, albumin of 2.8, calcium of 5.7 and hemoglobin of 7.9 g/dL.  Patient has received IV calcium.  09/02/2020: Patient seen on hemodialysis.  Also discussed with the local nephrologist, Dr. Harrie Jeans.  Patient is awaiting outpatient hemodialysis chair.  Significant ascites persists.  We will proceed with paracentesis.  Otherwise, no new complaints.  09/03/2020: Patient seen.  No new complaints.  Awaiting paracentesis.  Nephrology input is highly appreciated.  09/04/2020: Patient seen on hemodialysis.  No new complaints.  We will repeat CBC after hemodialysis.  Patient may have been transfused 1 unit of packed red blood cells earlier this morning.  Nephrology plans to discuss with oncology prior to starting ESA, considering history of metastatic breast cancer.  For paracentesis tomorrow most likely.  Subjective: -No shortness of breath. -No chest pain. -No other constitutional symptoms  reported.  Assessment & Plan:  CKD 5, now progressed to ESRD:  -Nephrology team is directing care. -PermCath has been placed.  AV fistula has been placed. -Education officer, museum is assisting with hemodialysis chair.   -Due to history of metastatic breast cancer, likely, patient will not be a candidate for combined liver and kidney transplantation. Recent Labs  Lab 08/31/20 0146 09/01/20 0428 09/02/20 0313 09/03/20 0322 09/04/20 0318  BUN 56* 57* 42* 30* 32*  CREATININE 9.34* 8.83* 6.65* 5.20* 6.05*   Non-anion gap metabolic acidosis: -Resolved.  Hyperkalemia -Resolved with hemodialysis. Recent Labs  Lab 08/31/20 0146 09/01/20 0428 09/02/20 0313 09/03/20 0322 09/04/20 0318  K 5.3* 5.1 4.6 3.9 3.8   Alcoholic cirrhosis of liver with ascites: -Patient is awaiting paracentesis.    Anemia of chronic disease: -Multifactorial. -Patient has chronic kidney disease. -Patient has advanced liver disease.   -Status post blood transfusion. -Nephrology plans to discuss with oncology prior to starting ESA. -We will defer management to the nephrology team.  Recent Labs  Lab 08/31/20 0146 08/31/20 1646 09/01/20 0428 09/02/20 0313 09/04/20 0318  HGB 6.6* 7.5* 7.3* 7.7* 6.6*  HCT 21.3* 23.9* 23.5* 24.7* 21.6*   Hypocalcemia: -Nephrology team is managing.   -Resolved.  HTN: -Continue to optimize. -Goal blood pressure should be less than or equal to 130/80 mmHg.   Metastatic breast cancer: -Follows at oncology at [facility] in [place] Order            Diet renal with fluid restriction Fluid restriction: 1200 mL Fluid; Room service appropriate? Yes; Fluid consistency: Thin  Diet effective now  Patient's Body mass index is 32.64 kg/m. DVT prophylaxis: heparin injection 5,000 Units Start: 09/01/20 2200 Code Status:   Code Status: Full Code  Family Communication: plan of care discussed with patient at bedside.  Status is: Inpatient Remains inpatient  appropriate because:IV treatments appropriate due to intensity of illness or inability to take PO and Inpatient level of care appropriate due to severity of illness  Dispo: The patient is from: Home              Anticipated d/c is to: Home              Patient currently is not medically stable to d/c.   Difficult to place patient No Unresulted Labs (From admission, onward)          Start     Ordered   09/04/20 1658  CBC  Once,   R       Question:  Specimen collection method  Answer:  IV Team=IV Team collect   09/04/20 1657   09/02/20 1252  Peritoneal Fluid culture ( includes gram stain)  (Bedside Paracentesis)  Once,   R       Question:  Are there also cytology or pathology orders on this specimen?  Answer:  Yes   09/02/20 1251   09/02/20 1252  Peritoneal Fluid cell count with differential  (Bedside Paracentesis)  Once,   R       Question:  Are there also cytology or pathology orders on this specimen?  Answer:  Yes   09/02/20 1251   09/02/20 1252  Protein, Peritoneal Fluid  (Bedside Paracentesis)  Once,   R        09/02/20 1251   09/02/20 1252  Albumin, Peritoneal Fluid  (Bedside Paracentesis)  Once,   R        09/02/20 1251   08/31/20 0500  Renal function panel  Daily,   R      08/30/20 2104         Medications reviewed:  Scheduled Meds: . sodium chloride   Intravenous Once  . amLODipine  10 mg Oral Daily  . calcitRIOL  0.5 mcg Oral Daily  . calcium carbonate  1,000 mg Oral BID  . Chlorhexidine Gluconate Cloth  6 each Topical Q0600  . heparin  5,000 Units Subcutaneous Q8H  . hydrALAZINE  100 mg Oral TID  . kidney failure book   Does not apply Once  . sevelamer carbonate  800 mg Oral TID WC   Continuous Infusions: . sodium chloride    . albumin human      Consultants:see note  Procedures:see note  Antimicrobials: Anti-infectives (From admission, onward)   Start     Dose/Rate Route Frequency Ordered Stop   09/01/20 1500  ceFAZolin (ANCEF) IVPB 2g/100 mL premix         2 g 200 mL/hr over 30 Minutes Intravenous To Radiology 08/31/20 1500 09/01/20 1000     Culture/Microbiology    Component Value Date/Time   SDES PERITONEAL 06/05/2017 [place] 06/05/2017 1117   [place] 06/05/2017 1117   SPECREQUEST NONE 06/05/2017 1117   CULT STREPTOCOCCUS MITIS/ORALIS (A) 06/05/2017 1117   REPTSTATUS 06/09/2017 FINAL 06/05/2017 1117   REPTSTATUS 06/05/2017 FINAL 06/05/2017 1117    Other culture-see note  Objective: Vitals: Today's Vitals   09/04/20 1500 09/04/20 1530 09/04/20 1600 09/04/20 1630  BP: 135/86 116/83 133/73 132/86  Pulse: 63 65 76 (!) 106  Resp:      Temp:  TempSrc:      SpO2:      Weight:      Height:      PainSc:        Intake/Output Summary (Last 24 hours) at 09/04/2020 1657 Last data filed at 09/04/2020 1300 Gross per 24 hour  Intake 530 ml  Output --  Net 530 ml   Filed Weights   09/02/20 1000 09/02/20 1337 09/04/20 1354  Weight: 76.9 kg 75.5 kg 75.8 kg   Weight change:   Intake/Output from previous day: 05/29 0701 - 05/30 0700 In: 660 [P.O.:660] Out: -  Intake/Output this shift: Total I/O In: 350 [P.O.:350] Out: -  Filed Weights   09/02/20 1000 09/02/20 1337 09/04/20 1354  Weight: 76.9 kg 75.5 kg 75.8 kg    Examination: General exam: AAOx 3, elderly, frail HEENT: Patient is pale.   Respiratory system: Clear to auscultation. Cardiovascular system: S1 & S2  Gastrointestinal system: Abdomen is distended (ascites).  Organs are difficult to assess.   Nervous System: Awake and alert.  Patient moves all extremities.   Extremities: 1+ bilateral lower extremity edema.    Data Reviewed: I have personally reviewed following labs and imaging studies CBC: Recent Labs  Lab 08/30/20 1544 08/31/20 0146 08/31/20 1646 09/01/20 0428 09/02/20 0313 09/04/20 0318  WBC 5.2 4.2  --  4.3 7.3 6.4  NEUTROABS 4.1  --   --   --   --   --   HGB 7.9* 6.6* 7.5* 7.3* 7.7* 6.6*  HCT 26.8* 21.3* 23.9* 23.5*  24.7* 21.6*  MCV 89.3 86.6  --  85.5 85.8 88.2  PLT 149* 122*  --  146* 130* 81*   Basic Metabolic Panel: Recent Labs  Lab 08/30/20 2330 08/31/20 0146 09/01/20 0428 09/02/20 0313 09/03/20 0322 09/04/20 0318  NA  --  134* 136 134* 133* 131*  K  --  5.3* 5.1 4.6 3.9 3.8  CL  --  102 103 101 99 99  CO2  --  20* 22 23 26 25   GLUCOSE  --  126* 80 115* 105* 98  BUN  --  56* 57* 42* 30* 32*  CREATININE  --  9.34* 8.83* 6.65* 5.20* 6.05*  CALCIUM  --  5.8* 6.5* 7.8* 8.1* 8.1*  MG 1.0* 0.9* 1.9  --   --   --   PHOS  --  8.6* 8.7* 6.8* 5.3* 5.0*   GFR: Estimated Creatinine Clearance: 8.7 mL/min (A) (by C-G formula based on SCr of 6.05 mg/dL (H)). Liver Function Tests: Recent Labs  Lab 08/30/20 1544 08/31/20 0146 09/01/20 0428 09/02/20 0313 09/03/20 0322 09/04/20 0318  AST 27  --   --   --   --   --   ALT 16  --   --   --   --   --   ALKPHOS 89  --   --   --   --   --   BILITOT 0.8  --   --   --   --   --   PROT 6.5  --   --   --   --   --   ALBUMIN 2.8* 2.3* 2.3* 2.6* 2.3* 2.4*   No results for input(s): LIPASE, AMYLASE in the last 168 hours. No results for input(s): AMMONIA in the last 168 hours. Coagulation Profile: Recent Labs  Lab 09/01/20 0428  INR 1.1   Cardiac Enzymes: No results for input(s): CKTOTAL, CKMB, CKMBINDEX, TROPONINI in the last 168 hours. BNP (last  3 results) No results for input(s): PROBNP in the last 8760 hours. HbA1C: No results for input(s): HGBA1C in the last 72 hours. CBG: No results for input(s): GLUCAP in the last 168 hours. Lipid Profile: No results for input(s): CHOL, HDL, LDLCALC, TRIG, CHOLHDL, LDLDIRECT in the last 72 hours. Thyroid Function Tests: No results for input(s): TSH, T4TOTAL, FREET4, T3FREE, THYROIDAB in the last 72 hours. Anemia Panel: No results for input(s): VITAMINB12, FOLATE, FERRITIN, TIBC, IRON, RETICCTPCT in the last 72 hours. Sepsis Labs: No results for input(s): PROCALCITON, LATICACIDVEN in the last 168  hours.  Recent Results (from the past 240 hour(s))  Resp Panel by RT-PCR (Flu A&B, Covid) Nasopharyngeal Swab     Status: None   Collection Time: 08/30/20  7:29 PM   Specimen: Nasopharyngeal Swab; Nasopharyngeal(NP) swabs in vial transport medium  Result Value Ref Range Status   SARS Coronavirus 2 by RT PCR NEGATIVE NEGATIVE Final    Comment: (NOTE) SARS-CoV-2 target nucleic acids are NOT DETECTED.  The SARS-CoV-2 RNA is generally detectable in upper respiratory specimens during the acute phase of infection. The lowest concentration of SARS-CoV-2 viral copies this assay can detect is 138 copies/mL. A negative result does not preclude SARS-Cov-2 infection and should not be used as the sole basis for treatment or other patient management decisions. A negative result may occur with  improper specimen collection/handling, submission of specimen other than nasopharyngeal swab, presence of viral mutation(s) within the areas targeted by this assay, and inadequate number of viral copies(<138 copies/mL). A negative result must be combined with clinical observations, patient history, and epidemiological information. The expected result is Negative.  Fact Sheet for Patients:  EntrepreneurPulse.com.au  Fact Sheet for Healthcare Providers:  IncredibleEmployment.be  This test is no t yet approved or cleared by the [place] FDA and  has been authorized for detection and/or diagnosis of SARS-CoV-2 by FDA under an Emergency Use Authorization (EUA). This EUA will remain  in effect (meaning this test can be used) for the duration of the COVID-19 declaration under Section 564(b)(1) of the Act, 21 U.S.C.section 360bbb-3(b)(1), unless the authorization is terminated  or revoked sooner.       Influenza A by PCR NEGATIVE NEGATIVE Final   Influenza B by PCR NEGATIVE NEGATIVE Final    Comment: (NOTE) The Xpert Xpress SARS-CoV-2/FLU/RSV plus assay is intended  as an aid in the diagnosis of influenza from Nasopharyngeal swab specimens and should not be used as a sole basis for treatment. Nasal washings and aspirates are unacceptable for Xpert Xpress SARS-CoV-2/FLU/RSV testing.  Fact Sheet for Patients: EntrepreneurPulse.com.au  Fact Sheet for Healthcare Providers: IncredibleEmployment.be  This test is not yet approved or cleared by the [place] FDA and has been authorized for detection and/or diagnosis of SARS-CoV-2 by FDA under an Emergency Use Authorization (EUA). This EUA will remain in effect (meaning this test can be used) for the duration of the COVID-19 declaration under Section 564(b)(1) of the Act, 21 U.S.C. section 360bbb-3(b)(1), unless the authorization is terminated or revoked.  Performed at [facility], [place] [address], [place], [place] [address]      Radiology Studies: No results found.   LOS: 5 days   Bonnell Public, MD [facility]  09/04/2020, 4:57 PM

## 2020-09-04 NOTE — Plan of Care (Addendum)
1350 Pt transported to HD   Problem: Health Behavior/Discharge Planning: Goal: Ability to manage health-related needs will improve Outcome: Progressing   Problem: Clinical Measurements: Goal: Ability to maintain clinical measurements within normal limits will improve Outcome: Progressing   Problem: Activity: Goal: Risk for activity intolerance will decrease Outcome: Progressing   Problem: Nutrition: Goal: Adequate nutrition will be maintained Outcome: Progressing   Problem: Coping: Goal: Level of anxiety will decrease Outcome: Progressing   Problem: Elimination: Goal: Will not experience complications related to bowel motility Outcome: Progressing   Problem: Pain Managment: Goal: General experience of comfort will improve Outcome: Progressing   Problem: Safety: Goal: Ability to remain free from injury will improve Outcome: Progressing   Problem: Skin Integrity: Goal: Risk for impaired skin integrity will decrease Outcome: Progressing

## 2020-09-04 NOTE — Plan of Care (Signed)
  Problem: Health Behavior/Discharge Planning: Goal: Ability to manage health-related needs will improve Outcome: Progressing   Problem: Clinical Measurements: Goal: Ability to maintain clinical measurements within normal limits will improve Outcome: Progressing Goal: Will remain free from infection Outcome: Progressing   

## 2020-09-04 NOTE — Plan of Care (Signed)
?  Problem: Education: ?Goal: Knowledge of General Education information will improve ?Description: Including pain rating scale, medication(s)/side effects and non-pharmacologic comfort measures ?Outcome: Progressing ?  ?Problem: Safety: ?Goal: Ability to remain free from injury will improve ?Outcome: Progressing ?  ?Problem: Pain Managment: ?Goal: General experience of comfort will improve ?Outcome: Progressing ?  ?

## 2020-09-04 NOTE — Progress Notes (Signed)
Patient ID: Alicia [name], female   DOB: 21-Sep-1955, 65 y.o.   MRN: [id number] [facility] Progress Note   Assessment/ Plan:   1.  End-stage renal disease: Progression from advancing chronic kidney disease stage V that appears to have been multifactorial.  Started on hemodialysis via left [facility] along with placement of right BBF by Dr. Carlis Abbott on 5/27.  She is on the schedule for hemodialysis today and process initiated for outpatient dialysis unit placement. 2.  Hyponatremia: Appears to be chronic at baseline likely from her advancing chronic kidney disease/cirrhosis.  Discussed fluid restriction <40 ounces a day and will monitor with hemodialysis. 3.  Anemia of chronic disease: Status post Feraheme/PRBCs earlier this hospitalization.  Will need discussion with oncology prior to undertaking ESA therapy in the setting of her recurrent metastatic breast cancer. 4.  Cirrhosis with recurrent ascites: Continue scheduled paracentesis as hemodialysis unlikely to be adequate for ascites management. 5.  Secondary hyperparathyroidism:  Calcium level continues to improve with ongoing supplementation and phosphorus levels currently at goal.  Subjective:   Reports to be feeling fair and had some mild discomfort over right upper arm surgical site last night.   Objective:   BP 132/84 (BP Location: Left Arm)   Pulse 100   Temp 98.5 F (36.9 C) (Oral)   Resp 18   Ht 5' (1.524 m)   Wt 75.5 kg   SpO2 98%   BMI 32.51 kg/m   Intake/Output Summary (Last 24 hours) at 09/04/2020 0958 Last data filed at 09/04/2020 0348 Gross per 24 hour  Intake 660 ml  Output --  Net 660 ml   Weight change:   Physical Exam: Gen: Comfortably resting in bed, watching television CVS: Pulse regular tachycardia, S1 and S2 normal. Left IJ TDC in place Resp: Poor respiratory excursion with decreased breath sounds over bases.  No rales/wheeze. Abd: Firm, distended, nontender Ext: 1-2+ lower extremity edema  bilaterally, right upper arm brachiobasilic fistula with palpable thrill.    Imaging: No results found.  Labs: BMET Recent Labs  Lab 08/30/20 1544 08/31/20 0146 09/01/20 0428 09/02/20 0313 09/03/20 0322 09/04/20 0318  NA 134* 134* 136 134* 133* 131*  K 5.2* 5.3* 5.1 4.6 3.9 3.8  CL 102 102 103 101 99 99  CO2 22 20* 22 23 26 25   GLUCOSE 108* 126* 80 115* 105* 98  BUN 57* 56* 57* 42* 30* 32*  CREATININE 9.68* 9.34* 8.83* 6.65* 5.20* 6.05*  CALCIUM 5.7* 5.8* 6.5* 7.8* 8.1* 8.1*  PHOS  --  8.6* 8.7* 6.8* 5.3* 5.0*   CBC Recent Labs  Lab 08/30/20 1544 08/31/20 0146 08/31/20 1646 09/01/20 0428 09/02/20 0313 09/04/20 0318  WBC 5.2 4.2  --  4.3 7.3 6.4  NEUTROABS 4.1  --   --   --   --   --   HGB 7.9* 6.6* 7.5* 7.3* 7.7* 6.6*  HCT 26.8* 21.3* 23.9* 23.5* 24.7* 21.6*  MCV 89.3 86.6  --  85.5 85.8 88.2  PLT 149* 122*  --  146* 130* 81*    Medications:    . sodium chloride   Intravenous Once  . amLODipine  10 mg Oral Daily  . calcitRIOL  0.5 mcg Oral Daily  . calcium carbonate  1,000 mg Oral BID  . Chlorhexidine Gluconate Cloth  6 each Topical Q0600  . heparin  5,000 Units Subcutaneous Q8H  . hydrALAZINE  100 mg Oral TID  . kidney failure book   Does not apply Once  . sevelamer  carbonate  800 mg Oral TID WC   Elmarie Shiley, MD 09/04/2020, 9:58 AM

## 2020-09-05 ENCOUNTER — Inpatient Hospital Stay (HOSPITAL_COMMUNITY): Payer: Medicare Other

## 2020-09-05 DIAGNOSIS — N179 Acute kidney failure, unspecified: Secondary | ICD-10-CM | POA: Diagnosis not present

## 2020-09-05 DIAGNOSIS — N185 Chronic kidney disease, stage 5: Secondary | ICD-10-CM | POA: Diagnosis not present

## 2020-09-05 DIAGNOSIS — Z992 Dependence on renal dialysis: Secondary | ICD-10-CM | POA: Diagnosis not present

## 2020-09-05 DIAGNOSIS — N186 End stage renal disease: Secondary | ICD-10-CM | POA: Diagnosis not present

## 2020-09-05 HISTORY — PX: IR PARACENTESIS: IMG2679

## 2020-09-05 LAB — RENAL FUNCTION PANEL
Albumin: 2.4 g/dL — ABNORMAL LOW (ref 3.5–5.0)
Anion gap: 9 (ref 5–15)
BUN: 16 mg/dL (ref 8–23)
CO2: 26 mmol/L (ref 22–32)
Calcium: 7.7 mg/dL — ABNORMAL LOW (ref 8.9–10.3)
Chloride: 99 mmol/L (ref 98–111)
Creatinine, Ser: 3.7 mg/dL — ABNORMAL HIGH (ref 0.44–1.00)
GFR, Estimated: 13 mL/min — ABNORMAL LOW (ref >60)
Glucose, Bld: 86 mg/dL (ref 70–99)
Phosphorus: 3.7 mg/dL (ref 2.5–4.6)
Potassium: 3.3 mmol/L — ABNORMAL LOW (ref 3.5–5.1)
Sodium: 134 mmol/L — ABNORMAL LOW (ref 135–145)

## 2020-09-05 LAB — TYPE AND SCREEN
ABO/RH(D): O POS
Antibody Screen: NEGATIVE
Unit division: 0

## 2020-09-05 LAB — BPAM RBC
Blood Product Expiration Date: 202206292359
ISSUE DATE / TIME: 202205301835
Unit Type and Rh: 5100

## 2020-09-05 LAB — PROTEIN, PLEURAL OR PERITONEAL FLUID: Total protein, fluid: <3 g/dL

## 2020-09-05 LAB — ALBUMIN, PLEURAL OR PERITONEAL FLUID: Albumin, Fluid: <1 g/dL

## 2020-09-05 LAB — GLUCOSE, PLEURAL OR PERITONEAL FLUID: Glucose, Fluid: 101 mg/dL

## 2020-09-05 LAB — BODY FLUID CELL COUNT WITH DIFFERENTIAL
Eos, Fluid: 0 %
Lymphs, Fluid: 42 %
Monocyte-Macrophage-Serous Fluid: 53 % (ref 50–90)
Neutrophil Count, Fluid: 5 % (ref 0–25)
Total Nucleated Cell Count, Fluid: 43 cu mm (ref 0–1000)

## 2020-09-05 MED ORDER — POTASSIUM CHLORIDE CRYS ER 20 MEQ PO TBCR
20.0000 meq | EXTENDED_RELEASE_TABLET | Freq: Once | ORAL | Status: AC
  Administered 2020-09-05: 20 meq via ORAL
  Filled 2020-09-05: qty 1

## 2020-09-05 MED ORDER — ALBUMIN HUMAN 25 % IV SOLN
100.0000 g | Freq: Once | INTRAVENOUS | Status: AC
  Administered 2020-09-05: 100 g via INTRAVENOUS
  Filled 2020-09-05: qty 400

## 2020-09-05 MED ORDER — LIDOCAINE HCL 1 % IJ SOLN
INTRAMUSCULAR | Status: AC
  Filled 2020-09-05: qty 20

## 2020-09-05 MED ORDER — SEVELAMER CARBONATE 800 MG PO TABS: 800.0000 mg | ORAL_TABLET | Freq: Three times a day (TID) | ORAL | 0 refills | Status: AC

## 2020-09-05 MED ORDER — HEPARIN SOD (PORK) LOCK FLUSH 100 UNIT/ML IV SOLN
500.0000 [IU] | Freq: Once | INTRAVENOUS | Status: AC
  Administered 2020-09-05: 500 [IU] via INTRAVENOUS
  Filled 2020-09-05: qty 5

## 2020-09-05 MED ORDER — LIDOCAINE HCL (PF) 1 % IJ SOLN
INTRAMUSCULAR | Status: DC | PRN
  Administered 2020-09-05: 10 mL

## 2020-09-05 MED ORDER — CALCIUM CARBONATE ANTACID 500 MG PO CHEW: 1000.0000 mg | CHEWABLE_TABLET | Freq: Two times a day (BID) | ORAL | 0 refills | Status: AC

## 2020-09-05 NOTE — Progress Notes (Signed)
2318  Pt's Albumin completed. Pt denies any pain or symptoms.Pt transported with her belongings to the entrance by RN.

## 2020-09-05 NOTE — Progress Notes (Signed)
Patient ID: Alicia [name], female   DOB: 1956-03-05, 65 y.o.   MRN: [id number] [facility] Progress Note   Assessment/ Plan:   1.  End-stage renal disease: Progression from advancing chronic kidney disease stage V that appears to have been multifactorial.  Started on hemodialysis via left [facility] along with placement of right BBF by Dr. Carlis Abbott on 5/27.  She underwent hemodialysis yesterday and I was informed that outpatient dialysis unit acceptance may be on a TTS schedule (if this is confirmed today, she can possibly be discharged home to resume dialysis again on Thursday at the outpatient dialysis unit).  We will give a single dose of potassium. 2.  Hyponatremia: Appears to be chronic at baseline likely from her advancing chronic kidney disease/cirrhosis.  Discussed fluid restriction <40 ounces a day and will monitor with hemodialysis. 3.  Anemia of chronic disease: Status post Feraheme/PRBCs earlier this hospitalization.  Will need discussion with oncology prior to undertaking ESA therapy in the setting of her recurrent metastatic breast cancer. 4.  Cirrhosis with recurrent ascites: Continue scheduled paracentesis as hemodialysis unlikely to be adequate for ascites management.  Awaiting paracentesis likely today (not done yesterday). 5.  Secondary hyperparathyroidism:  Calcium level continues to improve with ongoing supplementation and phosphorus levels currently at goal.  Subjective:   Reports to be feeling fair and underwent hemodialysis uneventfully yesterday.   Objective:   BP (!) 149/93 (BP Location: Left Arm)   Pulse 73   Temp 98.2 F (36.8 C) (Oral)   Resp 17   Ht 5' (1.524 m)   Wt 73.8 kg   SpO2 97%   BMI 31.78 kg/m   Intake/Output Summary (Last 24 hours) at 09/05/2020 1006 Last data filed at 09/04/2020 2145 Gross per 24 hour  Intake 565 ml  Output 1500 ml  Net -935 ml   Weight change:   Physical Exam: Gen: Resting comfortably in bed, speaking on phone CVS:  Pulse regular rhythm/normal rate S1 and S2 normal. Left IJ TDC in place Resp: Poor respiratory excursion with decreased breath sounds over bases.  No rales/wheeze. Abd: Firm, distended, nontender Ext: 1-2+ lower extremity edema bilaterally, right upper arm brachiobasilic fistula with palpable thrill.    Imaging: No results found.  Labs: BMET Recent Labs  Lab 08/30/20 1544 08/31/20 0146 09/01/20 0428 09/02/20 0313 09/03/20 0322 09/04/20 0318 09/05/20 0339  NA 134* 134* 136 134* 133* 131* 134*  K 5.2* 5.3* 5.1 4.6 3.9 3.8 3.3*  CL 102 102 103 101 99 99 99  CO2 22 20* 22 23 26 25 26   GLUCOSE 108* 126* 80 115* 105* 98 86  BUN 57* 56* 57* 42* 30* 32* 16  CREATININE 9.68* 9.34* 8.83* 6.65* 5.20* 6.05* 3.70*  CALCIUM 5.7* 5.8* 6.5* 7.8* 8.1* 8.1* 7.7*  PHOS  --  8.6* 8.7* 6.8* 5.3* 5.0* 3.7   CBC Recent Labs  Lab 08/30/20 1544 08/31/20 0146 09/01/20 0428 09/02/20 0313 09/04/20 0318 09/04/20 1730  WBC 5.2   < > 4.3 7.3 6.4 5.4  NEUTROABS 4.1  --   --   --   --   --   HGB 7.9*   < > 7.3* 7.7* 6.6* 6.9*  HCT 26.8*   < > 23.5* 24.7* 21.6* 22.4*  MCV 89.3   < > 85.5 85.8 88.2 86.8  PLT 149*   < > 146* 130* 81* 59*   < > = values in this interval not displayed.    Medications:    . sodium chloride  Intravenous Once  . amLODipine  10 mg Oral Daily  . calcitRIOL  0.5 mcg Oral Daily  . calcium carbonate  1,000 mg Oral BID  . Chlorhexidine Gluconate Cloth  6 each Topical Q0600  . heparin  5,000 Units Subcutaneous Q8H  . hydrALAZINE  100 mg Oral TID  . kidney failure book   Does not apply Once  . sevelamer carbonate  800 mg Oral TID WC   Elmarie Shiley, MD 09/05/2020, 10:06 AM
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
## 2020-09-05 NOTE — TOC Transition Note (Addendum)
Transition of Care [facility]) - CM/SW Discharge Note   Patient Details  Name: Shya Kovatch MRN: [id number] Date of Birth: 1955/04/30  Transition of Care [facility]) CM/SW Contact:  Sharin Mons, RN Phone Number: 09/05/2020, 2:49 PM   Clinical Narrative:    Patient will DC to: home Anticipated DC date:09/05/2020 Family notified: yes,  Transport by: Taxi  Stage 5 chronic kidney disease, now progressed to end-stage renal disease on hemodialysis. Pt from home with son 42 y/o [place]. Dashae to help assist with pt needs once d/c. PTA pt independent with ADL's, no DME usage. States drove self to hospital. Pt's car is parked in hospital garage.  Per MD patient ready for DC today. RN, patient, and patient's son Sherene Sires notified of DC. Pt without transportation to home. Son unable to arrange transportation...son's mode of transportion is a bike. [facility] on a TTS schedule, pt with a seat time of 12:20pm.   Pt currently receiving albumin, once completes d/c ready.  ED CM [phone]) to provide pt with 2 taxi vouchers, one for transportation to pharmacy to pick up Rx meds and the other for transportation from the pharmacy to home.   RNCM will sign off for now as intervention is no longer needed. Please consult [place] again if new needs arise.   Final next level of care: Home w Home Health Services Barriers to Discharge: No Barriers Identified   Patient Goals and CMS Choice        Discharge Placement                       Discharge Plan and Services   Discharge Planning Services: CM Consult                      HH Arranged: RN [facility] Agency: [place] Date [place]: 09/05/20 Time [place]: [phone] Representative spoke with at [place]: [place] Determinants of Health ([place]) Interventions     Readmission Risk Interventions No flowsheet data found.

## 2020-09-05 NOTE — Procedures (Signed)
PROCEDURE SUMMARY:  Successful image-guided paracentesis from the right lower abdomen.  Yielded 6.2 liters of cloudy yellow fluid.  No immediate complications.  EBL = trace. Patient tolerated well.   Specimen was sent for labs.  Please see imaging section of Epic for full dictation.   Tera Mater PA-C 09/05/2020 12:38 PM

## 2020-09-05 NOTE — Plan of Care (Signed)

## 2020-09-05 NOTE — Progress Notes (Signed)
Pt was given her AVS discharge summary and went over with her. Pt had no further questions. IV team order was placed to deaccess port.

## 2020-09-05 NOTE — Plan of Care (Signed)
  Problem: Education: Goal: Knowledge of General Education information will improve Description: Including pain rating scale, medication(s)/side effects and non-pharmacologic comfort measures 09/05/2020 1838 by Phebe Colla, RN Outcome: Adequate for Discharge 09/05/2020 1838 by Phebe Colla, RN Outcome: Progressing   Problem: Health Behavior/Discharge Planning: Goal: Ability to manage health-related needs will improve 09/05/2020 1838 by Phebe Colla, RN Outcome: Adequate for Discharge 09/05/2020 1838 by Phebe Colla, RN Outcome: Progressing   Problem: Clinical Measurements: Goal: Ability to maintain clinical measurements within normal limits will improve 09/05/2020 1838 by Phebe Colla, RN Outcome: Adequate for Discharge 09/05/2020 1838 by Phebe Colla, RN Outcome: Progressing Goal: Will remain free from infection Outcome: Adequate for Discharge Goal: Diagnostic test results will improve Outcome: Adequate for Discharge Goal: Respiratory complications will improve Outcome: Adequate for Discharge Goal: Cardiovascular complication will be avoided Outcome: Adequate for Discharge   Problem: Activity: Goal: Risk for activity intolerance will decrease 09/05/2020 1838 by Phebe Colla, RN Outcome: Adequate for Discharge 09/05/2020 1838 by Phebe Colla, RN Outcome: Progressing   Problem: Nutrition: Goal: Adequate nutrition will be maintained 09/05/2020 1838 by Phebe Colla, RN Outcome: Adequate for Discharge 09/05/2020 1838 by Fayrene Fearing D, RN Outcome: Progressing   Problem: Coping: Goal: Level of anxiety will decrease 09/05/2020 1838 by Phebe Colla, RN Outcome: Adequate for Discharge 09/05/2020 1838 by Phebe Colla, RN Outcome: Progressing   Problem: Elimination: Goal: Will not experience complications related to bowel motility Outcome: Adequate for Discharge Goal: Will not experience complications  related to urinary retention Outcome: Adequate for Discharge   Problem: Pain Managment: Goal: General experience of comfort will improve 09/05/2020 1838 by Phebe Colla, RN Outcome: Adequate for Discharge 09/05/2020 1838 by Phebe Colla, RN Outcome: Progressing   Problem: Safety: Goal: Ability to remain free from injury will improve Outcome: Adequate for Discharge   Problem: Skin Integrity: Goal: Risk for impaired skin integrity will decrease Outcome: Adequate for Discharge

## 2020-09-05 NOTE — Discharge Summary (Signed)
Physician Discharge Summary  Patient ID: Alicia [name] MRN: [id number] DOB/AGE: [birth year] 65 y.o.  Admit date: 08/30/2020 Discharge date: 09/05/2020  Admission Diagnoses:  Discharge Diagnoses:  Principal Problem: End-stage renal disease on hemodialysis  Stage 5 chronic kidney disease, now progressed to end-stage renal disease on hemodialysis.    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   HTN (hypertension)   Hypocalcemia   Metastatic breast cancer (HCC)   Normocytic anemia   Hyperkalemia   Prolonged QT interval   ESRD on dialysis ([place])   Anasarca associated with disorder of kidney   Discharged Condition: stable  Hospital Course:  Patient is a 65 year old female with history of alcoholic liver cirrhosis with ascites that has been drained monthly, last paracentesis was on 08/23/2018; chronic kidney disease stage V, hypertension, metastatic breast cancer and chronic anemia.  Patient presented for evaluation of worsening renal function based on outpatient blood work.  Apparently, patient had had diarrhea for a month prior to presentation.  Associated loss of appetite, but has improved prior to admission.  Patient also endorsed that she had blood transfusion in [place] the week prior to admission.  Patient also denied history of alcohol use for 2 weeks preceding admission.  Patient was admitted for further assessment and management.  Patient was deemed to have progressed to end-stage renal disease.  Patient had PermCath and AV fistula placed, and patient was started on dialysis.  Patient tolerated hemodialysis during this hospital stay.  Patient was also transfused with 1 unit of packed red blood cells during the hospital stay.  Nephrology team did not start patient on ESA due to history of metastatic breast cancer.  Nephrology team preferred to discuss with the oncology team first.   Patient also underwent paracentesis (5.5 L of ascitic fluid drained) prior to discharge.  Patient has been optimized, and will continue hemodialysis on outpatient basis.  Next hemodialysis will be on Thursday, 09/07/2020.  The plan is for patient to continue hemodialysis on Tuesday, Thursday and Saturdays.  Patient will follow with the nephrology team, GI team, oncology team and primary care provider on discharge.  CKD 5, now progressed to ESRD:  -PermCath and AV fistula were placed during the hospital stay. -Patient has been started on hemodialysis. -Patient will continue hemodialysis on Tuesday, Thursday and Saturdays.  Next hemodialysis is on Thursday, 09/07/2020.  -Due to history of metastatic breast cancer, likely, patient will not be a candidate for combined liver and kidney transplantation. Last Labs          Recent Labs  Lab 08/30/20 1544 08/31/20 0146 09/01/20 0428 09/02/20 0313 09/03/20 0322  BUN 57* 56* 57* 42* 30*  CREATININE 9.68* 9.34* 8.83* 6.65* 5.20*     Non-anion gap metabolic acidosis: -Resolved.  Hyperkalemia -Resolved with renal replacement therapy.   Last Labs          Recent Labs  Lab 08/30/20 1544 08/31/20 0146 09/01/20 0428 09/02/20 0313 09/03/20 0322  K 5.2* 5.3* 5.1 4.6 3.9     Alcoholic cirrhosis of liver with ascites: -Patient has recurrent ascites. -Patient underwent 5.5 L just before discharge.   Anemia of chronic disease: -Multifactorial. -Patient has chronic kidney disease. -Patient has advanced liver disease.   -We will defer management to the nephrology team. -ESA not started due to history of metastatic cancer.  Nephrology team will prefer to speak to oncology team prior to starting ESA.  Last Labs  Recent Labs  Lab 08/30/20 1544 08/31/20 0146 08/31/20 1646 09/01/20 0428 09/02/20 0313  HGB 7.9* 6.6* 7.5* 7.3* 7.7*  HCT 26.8* 21.3* 23.9* 23.5* 24.7*     Hypocalcemia: -Nephrology team is managing.    -Resolved.  HTN: -Continue to optimize. -Goal blood pressure should be less than or equal to 130/80 mmHg.   Metastatic breast cancer: -Follows at oncology at [facility] in [place]: nephrology and Interventional radiology  Treatments: Hemodialysis and paracentesis.  Discharge Exam: Blood pressure 133/89, pulse 73, temperature 98.2 F (36.8 C), temperature source Oral, resp. rate 17, height 5' (1.524 m), weight 73.8 kg, SpO2 97 %.   Disposition: Discharge disposition: 01-Home or Self Care  Discharge Instructions    Diet - low sodium heart healthy   Complete by: As directed    Discharge wound care:   Complete by: As directed    Continue current wound care plan.   Increase activity slowly   Complete by: As directed      Allergies as of 09/05/2020      Reactions   Compazine [prochlorperazine Edisylate] Anxiety   Flexeril [cyclobenzaprine] Anxiety   Meclizine Anxiety      Medication List    STOP taking these medications   aspirin-acetaminophen-caffeine 250-250-65 MG tablet Commonly known as: EXCEDRIN MIGRAINE   calcium carbonate 1500 (600 Ca) MG Tabs tablet Commonly known as: OSCAL   omeprazole 40 MG capsule Commonly known as: PRILOSEC   sodium bicarbonate 650 MG tablet     TAKE these medications   acetaminophen 500 MG tablet Commonly known as: TYLENOL Take 500 mg by mouth every 6 (six) hours as needed for moderate pain or headache.   amLODipine 10 MG tablet Commonly known as: NORVASC Take 1 tablet by mouth daily.   calcitRIOL 0.5 MCG capsule Commonly known as: ROCALTROL Take 0.5 mcg by mouth daily.   calcium carbonate 500 MG chewable tablet Commonly known as: TUMS - dosed in mg elemental calcium Chew 2 tablets (1,000 mg total) by mouth 2 (two) times daily.   furosemide 40 MG tablet Commonly known as: LASIX Take 40 mg by mouth daily.   hydrALAZINE 100 MG tablet Commonly known as: APRESOLINE Take 100 mg by mouth 3 (three) times  daily.   sevelamer carbonate 800 MG tablet Commonly known as: RENVELA Take 1 tablet (800 mg total) by mouth 3 (three) times daily with meals.   sodium chloride 0.65 % Soln nasal spray Commonly known as: OCEAN Place 1 spray into both nostrils as needed for congestion.   spironolactone 25 MG tablet Commonly known as: ALDACTONE Take 1 tablet by mouth daily.            Discharge Care Instructions  (From admission, onward)         Start     Ordered   09/05/20 0000  Discharge wound care:       Comments: Continue current wound care plan.   09/05/20 1414          Follow-up Information    [facility].   Why: 4-6 weeks. The office will call the patient with an appointment(sent) Contact information: [address] [place] [phone]              Signed: Bonnell Public 09/05/2020, 2:14 PM

## 2020-09-06 ENCOUNTER — Telehealth: Payer: Self-pay | Admitting: Physician Assistant

## 2020-09-06 LAB — CYTOLOGY - NON PAP

## 2020-09-06 NOTE — Telephone Encounter (Signed)
Transition of care contact from inpatient facility  Date of Discharge:  09/05/20 Date of Contact: 09/06/20 Method of contact: Phone  Attempted to contact patient to discuss transition of care from inpatient admission. Patient did not answer the phone. Message was left on the patient's voicemail with call back number [phone].  Anice Paganini, PA-C 09/06/2020, 3:06 PM  [employer]

## 2020-09-09 LAB — BODY FLUID CULTURE W GRAM STAIN
Culture: NO GROWTH
Gram Stain: NONE SEEN

## 2020-09-15 ENCOUNTER — Other Ambulatory Visit: Payer: Self-pay | Admitting: *Deleted

## 2020-09-15 DIAGNOSIS — Z992 Dependence on renal dialysis: Secondary | ICD-10-CM

## 2020-09-15 DIAGNOSIS — N186 End stage renal disease: Secondary | ICD-10-CM

## 2020-10-11 ENCOUNTER — Ambulatory Visit (HOSPITAL_COMMUNITY): Payer: Medicare Other

## 2020-10-13 ENCOUNTER — Other Ambulatory Visit: Payer: Self-pay | Admitting: Internal Medicine

## 2020-10-24 ENCOUNTER — Other Ambulatory Visit: Payer: Self-pay

## 2020-10-24 DIAGNOSIS — N186 End stage renal disease: Secondary | ICD-10-CM

## 2020-11-01 ENCOUNTER — Other Ambulatory Visit: Payer: Self-pay

## 2020-11-01 ENCOUNTER — Ambulatory Visit (INDEPENDENT_AMBULATORY_CARE_PROVIDER_SITE_OTHER): Payer: Medicare Other | Admitting: Physician Assistant

## 2020-11-01 ENCOUNTER — Ambulatory Visit (HOSPITAL_COMMUNITY)
Admission: RE | Admit: 2020-11-01 | Discharge: 2020-11-01 | Disposition: A | Discharge status: Home / Self Care | Payer: Medicare Other | Source: Ambulatory Visit | Attending: Vascular Surgery | Admitting: Vascular Surgery

## 2020-11-01 VITALS — BP 158/86 | HR 54 | Temp 98.0°F | Resp 20 | Ht 60.0 in | Wt 133.4 lb

## 2020-11-01 DIAGNOSIS — Z992 Dependence on renal dialysis: Secondary | ICD-10-CM | POA: Diagnosis present

## 2020-11-01 DIAGNOSIS — N186 End stage renal disease: Secondary | ICD-10-CM

## 2020-11-01 NOTE — Progress Notes (Signed)
    Postoperative Access Visit   History of Present Illness   Alicia [name] is a 65 y.o. year old female who presents for postoperative follow-up for: insertion of left IJ [facility] and placement of right first stage basilic vein transposition by Dr. Carlis Abbott on 09/01/20. The patient's wounds are well healed.  The patient notes no steal symptoms.  The patient is able to complete their activities of daily living.  She is currently dialyzing via her left IJ [facility] which she says is working well  She is currently dialyzing on Tues/ Thurs/ Sat at the [address] location via her left IJ [facility]  Physical Examination   Vitals:   11/01/20 1536  BP: (!) 158/86  Pulse: (!) 54  Resp: 20  Temp: 98 F (36.7 C)  TempSrc: Temporal  SpO2: 100%  Weight: 133 lb 6.4 oz (60.5 kg)  Height: 5' (1.524 m)   Body mass index is 26.05 kg/m.  right arm Incision is well healed, 2+ radial pulse, hand grip is 5/5, sensation in digits is intact, palpable thrill, stenosis auscultated.  She also has fat pad present in area of anastomosis. This is present in both upper extremities at the medial [facility]   Non invasive vascular lab: Findings:  +--------------------+----------+-----------------+--------+  AVF                 PSV (cm/s)Flow Vol (mL/min)Comments  +--------------------+----------+-----------------+--------+   Native artery inflow   131           612                 +--------------------+----------+-----------------+--------+  AVF Anastomosis        807                               +--------------------+----------+-----------------+--------+      +------------+----------+-------------+----------+--------+  OUTFLOW VEINPSV (cm/s)Diameter (cm)Depth (cm)Describe  +------------+----------+-------------+----------+--------+  Mid UA          89        0.66        0.48             +------------+----------+-------------+----------+--------+  Dist UA     583 / 137     0.21        0.26              +------------+----------+-------------+----------+--------+  AC Fossa       454        0.33        0.46             +------------+----------+-------------+----------+--------+    Medical Decision Making   Alicia [name] is a 65 y.o. year old female who presents s/p insertion of left IJ [facility] and placement of right first stage basilic vein transposition by Dr. Carlis Abbott on 09/01/20. Duplex shows some stenosis at the anastomosis and the vein also has not fully matured in the distal upper arm. I have encouraged her to continue to exercise her arm. She will need a 2nd stage basilic vein transposition and will arrange this in near future with [name]. Hopefully with the transposition it will fix the narrowing at the anastomosis that she has present. She dialyzes on Tues/ Thurs/ Sat so will try to arrange this around her dialysis schedule.     Karoline Caldwell, PA-C [facility] [place] Office: [phone]  Clinic MD: Scot Dock

## 2020-11-10 ENCOUNTER — Encounter (HOSPITAL_COMMUNITY): Payer: Self-pay | Admitting: Vascular Surgery

## 2020-11-10 ENCOUNTER — Other Ambulatory Visit: Payer: Self-pay

## 2020-11-10 ENCOUNTER — Telehealth: Payer: Self-pay

## 2020-11-10 NOTE — Telephone Encounter (Signed)
Patient calls today to cancel her second stage BVT on Monday 8/8 with [name]. She would like to continue using her catheter currently as she is dealing with cancer and has a lot going on. I advised her [name] was retiring and would be unable to perform the surgery in the future. We discussed TDC versus arm access - that catheters were usually used to initiate needed HD while fistulas/grafts matured and then removed due to potential for infection. She verbalized understanding. She would like to discuss the surgery risks and benefits further with MD - she is available on Wednesdays. Scheduled her for a phone visit in a few weeks.

## 2020-11-10 NOTE — Progress Notes (Signed)
PCP - [name], PA-C Cardiologist - denies  PPM/ICD - denies   Chest x-ray - n/a EKG - 09/02/20 Stress Test - when she was 65yo, needed it for work clearance (normal per patient) ECHO - denies Cardiac Cath - denies  CPAP - no  No diabetes  Patient instructed to hold all Aspirin, NSAID's, herbal medications, fish oil and vitamins 7 days prior to surgery.   ERAS Protcol - no  COVID TEST- ambulatory surgery, not needed  Anesthesia review: no  Patient verbally denies any shortness of breath, fever, cough and chest pain during phone call   -------------  SDW INSTRUCTIONS given:  Your procedure is scheduled on 11/13/20.  Report to [facility] Main Entrance "A" at 07:00 A.M., and check in at the Admitting office.  Call this number if you have problems the morning of surgery:  [phone]   Remember:  Do not eat or drink after midnight the night before your surgery      Take these medicines the morning of surgery with A SIP OF WATER  acetaminophen (TYLENOL) if needed amLODipine (NORVASC) calcitRIOL (ROCALTROL) hydrALAZINE (APRESOLINE)  sodium chloride (OCEAN) nasal spray  As of today, STOP taking any Aspirin (unless otherwise instructed by your surgeon) Aleve, Naproxen, Ibuprofen, Motrin, Advil, Goody's, BC's, all herbal medications, fish oil, and all vitamins.                      Do not wear jewelry, make up, or nail polish            Do not wear lotions, powders, perfumes/colognes, or deodorant.            Do not shave 48 hours prior to surgery.  Men may shave face and neck.            Do not bring valuables to the hospital.            [facility] is not responsible for any belongings or valuables.  Do NOT Smoke (Tobacco/Vaping) or drink Alcohol 24 hours prior to your procedure If you use a CPAP at night, you may bring all equipment for your overnight stay.   Contacts, glasses, dentures or bridgework may not be worn into surgery.      For patients admitted to  the hospital, discharge time will be determined by your treatment team.   Patients discharged the day of surgery will not be allowed to drive home, and someone needs to stay with them for 24 hours.    Special instructions:   [facility]- Preparing For Surgery  Before surgery, you can play an important role. Because skin is not sterile, your skin needs to be as free of germs as possible. You can reduce the number of germs on your skin by washing with CHG (chlorahexidine gluconate) Soap before surgery.  CHG is an antiseptic cleaner which kills germs and bonds with the skin to continue killing germs even after washing.    Oral Hygiene is also important to reduce your risk of infection.  Remember - BRUSH YOUR TEETH THE MORNING OF SURGERY WITH YOUR REGULAR TOOTHPASTE  Please do not use if you have an allergy to CHG or antibacterial soaps. If your skin becomes reddened/irritated stop using the CHG.  Do not shave (including legs and underarms) for at least 48 hours prior to first CHG shower. It is OK to shave your face.  Please follow these instructions carefully.   Shower the NIGHT BEFORE SURGERY and the [facility]  OF SURGERY with DIAL Soap.   Pat yourself dry with a CLEAN TOWEL.  Wear CLEAN PAJAMAS to bed the night before surgery  Place CLEAN SHEETS on your bed the night of your first shower and DO NOT SLEEP WITH PETS.   [place] of Surgery: Please shower morning of surgery  Wear Clean/Comfortable clothing the morning of surgery Do not apply any deodorants/lotions.   Remember to brush your teeth WITH YOUR REGULAR TOOTHPASTE.   Questions were answered. Patient verbalized understanding of instructions.

## 2020-11-13 ENCOUNTER — Ambulatory Visit (HOSPITAL_COMMUNITY): Admission: RE | Admit: 2020-11-13 | Payer: Medicare Other | Source: Home / Self Care | Admitting: Vascular Surgery

## 2020-11-13 HISTORY — DX: Pneumonia, unspecified organism: J18.9

## 2020-11-13 SURGERY — TRANSPOSITION, VEIN, BASILIC
Anesthesia: Choice | Laterality: Right

## 2020-11-29 ENCOUNTER — Other Ambulatory Visit: Payer: Self-pay

## 2020-11-29 ENCOUNTER — Ambulatory Visit (INDEPENDENT_AMBULATORY_CARE_PROVIDER_SITE_OTHER): Payer: Self-pay | Admitting: Vascular Surgery

## 2020-11-29 ENCOUNTER — Encounter: Payer: Self-pay | Admitting: Vascular Surgery

## 2020-11-29 DIAGNOSIS — N186 End stage renal disease: Secondary | ICD-10-CM

## 2020-11-29 DIAGNOSIS — Z992 Dependence on renal dialysis: Secondary | ICD-10-CM

## 2020-11-29 NOTE — Progress Notes (Signed)
Patient name: Alicia [name] DOB: 04/05/1956 Sex: female    Referring Provider is Juanda Chance  PCP is Mindi Curling, PA-C  REASON FOR VIRTUAL VISIT: To discuss AV fistula versus TDC per office notes.  I connected with Alicia [name] on 11/29/20 at  2:40 PM EDT by a telephone visit and verified that I am speaking with the correct person using two identifiers. I discussed the limitations of evaluation and management by telemedicine and the availability of in person appointments. The patient expressed understanding and agreed to proceed.  Location: Patient: Home Provider: Office  HPI: Alicia [name] is a 65 y.o. female this patient had a first stage basilic vein transposition on 09/01/2020 by Dr. Carlis Abbott.  She was then seen in follow-up on 11/01/2020 by Karoline Caldwell, PA.  At that point the vein had not matured adequately yet as there was some narrowing at the proximal anastomosis.  She was set up for a second stage basilic vein transposition hoping that the proximal narrowing could be addressed at the same time.  However the patient canceled her surgery.  She was set up for a virtual visit with me today to discuss all this.  Today she really does not have any specific complaints although she is having a hard time understanding why she needs all this surgery.  She dialyzes on Tuesdays Thursdays and Saturdays.  She has a functioning catheter.  Current Outpatient Medications  Medication Sig Dispense Refill   acetaminophen (TYLENOL) 500 MG tablet Take 1,000 mg by mouth every 6 (six) hours as needed for moderate pain or headache.     amLODipine (NORVASC) 10 MG tablet Take 10 mg by mouth daily.     aspirin-acetaminophen-caffeine (EXCEDRIN MIGRAINE) 250-250-65 MG tablet Take 2 tablets by mouth every 6 (six) hours as needed for headache.     calcitRIOL (ROCALTROL) 0.5 MCG capsule Take 0.5 mcg by mouth daily.     calcium carbonate (TUMS - DOSED IN MG ELEMENTAL CALCIUM) 500 MG chewable  tablet Chew 2 tablets by mouth daily as needed for indigestion or heartburn.     CALCIUM PO Take 500 mg by mouth daily.     ferrous sulfate 325 (65 FE) MG tablet Take 325 mg by mouth daily with breakfast.     furosemide (LASIX) 40 MG tablet Take 40 mg by mouth daily.     hydrALAZINE (APRESOLINE) 100 MG tablet Take 100 mg by mouth 2 (two) times daily.     magnesium oxide (MAG-OX) 400 MG tablet Take 400 mg by mouth 2 (two) times daily.     omeprazole (PRILOSEC) 20 MG capsule Take 20 mg by mouth daily as needed (reflux).     sevelamer carbonate (RENVELA) 800 MG tablet Take 800 mg by mouth 3 (three) times daily with meals.     sodium chloride (OCEAN) 0.65 % SOLN nasal spray Place 1 spray into both nostrils as needed for congestion.     spironolactone (ALDACTONE) 25 MG tablet Take 25 mg by mouth daily.     No current facility-administered medications for this visit.   REVIEW OF SYSTEMS: Valu.Nieves ] denotes positive finding; [  ] denotes negative finding  CARDIOVASCULAR:  [ ]  chest pain   [ ]  dyspnea on exertion  [ ]  leg swelling  CONSTITUTIONAL:  [ ]  fever   [ ]  chills   OBSERVATIONS/OBJECTIVE:  DATA: No new data  MEDICAL ISSUES:  ESRD: This patient has had a for stage basilic vein transposition.  She has some  narrowing at the anastomosis.  The vein has reasonable diameters in the central portion of the upper arm but narrows down more centrally.  I would agree that it would be reasonable to proceed with a second stage basilic vein transposition in hopes of addressing the proximal stenosis.  I explained that this may or may not work.  If it does not work she would require new access.  I encouraged her to schedule her surgery on a nondialysis day but she is very reluctant to proceed without discussing this further in the office.  She would like to be seen in the office with one of the physicians to discuss this further.  She dialyzes on Tuesdays Thursdays and Saturdays so she would like this scheduled on a  Monday or Wednesday.  FOLLOW UP INSTRUCTIONS:   I discussed the assessment and treatment plan with the patient. The patient was provided an opportunity to ask questions and all were answered. The patient agreed with the plan and demonstrated an understanding of the instructions. The patient was advised to call back or seek an in-person evaluation if the symptoms worsen or if the condition fails to improve as anticipated.  I provided 8 minutes of non-face-to-face time during this encounter.  Alicia [name] [facility]

## 2020-12-13 ENCOUNTER — Encounter: Payer: Self-pay | Admitting: Vascular Surgery

## 2020-12-13 ENCOUNTER — Ambulatory Visit (INDEPENDENT_AMBULATORY_CARE_PROVIDER_SITE_OTHER): Payer: Medicare Other | Admitting: Vascular Surgery

## 2020-12-13 ENCOUNTER — Other Ambulatory Visit: Payer: Self-pay

## 2020-12-13 VITALS — BP 146/78 | HR 100 | Temp 98.0°F | Resp 20 | Ht 60.0 in | Wt 133.0 lb

## 2020-12-13 DIAGNOSIS — N186 End stage renal disease: Secondary | ICD-10-CM | POA: Diagnosis not present

## 2020-12-13 DIAGNOSIS — Z992 Dependence on renal dialysis: Secondary | ICD-10-CM | POA: Diagnosis not present

## 2020-12-13 NOTE — Progress Notes (Signed)
Patient ID: Alicia [name], female   DOB: Oct 09, 1955, 65 y.o.   MRN: [id number]  Reason for Consult: Follow-up   Referred by Mindi Curling, PA-C  Subjective:     HPI:  Alicia [name] is a 65 y.o. female history of end-stage renal disease currently on dialysis via tunneled dialysis catheter.  She had a right arm first stage basilic vein placed.  Her right arm has been fine.  She has had multiple questions about this and in fact was seen as a virtual visit 2 weeks ago in our office to have her questions answered.  She now has more questions regarding her fistula and is here today to discuss.  Past Medical History:  Diagnosis Date   Ascites    Breast cancer ([place])    Cancer ([place])    Chronic anticoagulation    Cirrhosis (HCC)    CKD (chronic kidney disease), stage III (HCC)    Dyspnea    with fluid buildup    Esophageal varices (HCC) 11/2016   Small noted on ENDO   GERD (gastroesophageal reflux disease)    Hypertension    Hypotension    Pneumonia    Portal hypertension ([place])    Family History  Problem Relation Age of Onset   Diabetes Mother    Diabetes Father    Diabetes Brother    Colon cancer Neg Hx    Stomach cancer Neg Hx    Esophageal cancer Neg Hx    Past Surgical History:  Procedure Laterality Date   AV FISTULA PLACEMENT Right 09/01/2020   Procedure: RIGHT ARM ARTERIOVENOUS 1st STAGE BASILIC;  Surgeon: Marty Heck, MD;  Location: [facility];  Service: Vascular;  Laterality: Right;   BREAST LUMPECTOMY     BREAST SURGERY     CESAREAN SECTION     COLONOSCOPY WITH PROPOFOL N/A 05/25/2019   Procedure: COLONOSCOPY WITH PROPOFOL;  Surgeon: Wilford Corner, MD;  [facility];  Service: Endoscopy;  Laterality: N/A;   ESOPHAGOGASTRODUODENOSCOPY (EGD) WITH PROPOFOL N/A 11/21/2016   Procedure: ESOPHAGOGASTRODUODENOSCOPY;  Surgeon: Milus Banister, MD;  [facility];  Service: Endoscopy;  Laterality: N/A;   ESOPHAGOGASTRODUODENOSCOPY (EGD) WITH  PROPOFOL N/A 05/25/2019   Procedure: ESOPHAGOGASTRODUODENOSCOPY (EGD) WITH PROPOFOL;  Surgeon: Wilford Corner, MD;  [facility];  Service: Endoscopy;  Laterality: N/A;   INSERTION OF DIALYSIS CATHETER Left 09/01/2020   Procedure: INSERTION OF A LEFT INTERNAL JUGULAR TUNNELLED DIALYSIS CATHETER. ULTRASOUND GUIDED;  Surgeon: Marty Heck, MD;  Location: [facility];  Service: Vascular;  Laterality: Left;   IR PARACENTESIS  09/04/2016   IR PARACENTESIS  09/20/2016   IR PARACENTESIS  10/22/2016   IR PARACENTESIS  01/27/2017   IR PARACENTESIS  04/11/2017   IR PARACENTESIS  06/05/2017   IR PARACENTESIS  06/05/2017   IR PARACENTESIS  06/19/2017   IR PARACENTESIS  07/03/2017   IR PARACENTESIS  07/17/2017   IR PARACENTESIS  08/01/2017   IR PARACENTESIS  08/14/2017   IR PARACENTESIS  08/28/2017   IR PARACENTESIS  09/11/2017   IR PARACENTESIS  09/25/2017   IR PARACENTESIS  10/08/2017   IR PARACENTESIS  10/23/2017   IR PARACENTESIS  11/06/2017   IR PARACENTESIS  11/20/2017   IR PARACENTESIS  12/04/2017   IR PARACENTESIS  12/18/2017   IR PARACENTESIS  01/19/2018   IR PARACENTESIS  02/09/2018   IR PARACENTESIS  02/24/2018   IR PARACENTESIS  03/16/2018   IR PARACENTESIS  04/10/2018   IR PARACENTESIS  05/08/2018  IR PARACENTESIS  06/11/2018   IR PARACENTESIS  07/07/2018   IR PARACENTESIS  09/04/2018   IR PARACENTESIS  10/22/2018   IR PARACENTESIS  11/30/2018   IR PARACENTESIS  01/14/2019   IR PARACENTESIS  04/30/2019   IR PARACENTESIS  07/19/2019   IR PARACENTESIS  09/05/2020   IR RADIOLOGIST EVAL & MGMT  12/23/2017   LYMPH NODE DISSECTION     ULTRASOUND GUIDANCE FOR VASCULAR ACCESS Left 09/01/2020   Procedure: ULTRASOUND GUIDANCE FOR VASCULAR ACCESS;  Surgeon: Marty Heck, MD;  Location: [facility];  Service: Vascular;  Laterality: Left;    Short Social History:  Social History   Tobacco Use   Smoking status: Never   Smokeless tobacco: Never  Substance Use Topics   Alcohol use: Not Currently     Comment: occassional wine     Allergies  Allergen Reactions   Compazine [Prochlorperazine Edisylate] Anxiety   Flexeril [Cyclobenzaprine] Anxiety   Meclizine Anxiety    Current Outpatient Medications  Medication Sig Dispense Refill   acetaminophen (TYLENOL) 500 MG tablet Take 1,000 mg by mouth every 6 (six) hours as needed for moderate pain or headache.     amLODipine (NORVASC) 10 MG tablet Take 10 mg by mouth daily.     aspirin-acetaminophen-caffeine (EXCEDRIN MIGRAINE) 250-250-65 MG tablet Take 2 tablets by mouth every 6 (six) hours as needed for headache.     calcitRIOL (ROCALTROL) 0.5 MCG capsule Take 0.5 mcg by mouth daily.     calcium carbonate (TUMS - DOSED IN MG ELEMENTAL CALCIUM) 500 MG chewable tablet Chew 2 tablets by mouth daily as needed for indigestion or heartburn.     CALCIUM PO Take 500 mg by mouth daily.     ferrous sulfate 325 (65 FE) MG tablet Take 325 mg by mouth daily with breakfast.     furosemide (LASIX) 40 MG tablet Take 40 mg by mouth daily.     hydrALAZINE (APRESOLINE) 100 MG tablet Take 100 mg by mouth 2 (two) times daily.     magnesium oxide (MAG-OX) 400 MG tablet Take 400 mg by mouth 2 (two) times daily.     omeprazole (PRILOSEC) 20 MG capsule Take 20 mg by mouth daily as needed (reflux).     sevelamer carbonate (RENVELA) 800 MG tablet Take 800 mg by mouth 3 (three) times daily with meals.     sodium chloride (OCEAN) 0.65 % SOLN nasal spray Place 1 spray into both nostrils as needed for congestion.     spironolactone (ALDACTONE) 25 MG tablet Take 25 mg by mouth daily.     No current facility-administered medications for this visit.    Review of Systems  Constitutional:  Constitutional negative. HENT: HENT negative.  Eyes: Eyes negative.  Respiratory: Respiratory negative.  Cardiovascular: Cardiovascular negative.  GI: Gastrointestinal negative.  Musculoskeletal: Musculoskeletal negative.  Skin: Skin negative.  Neurological: Neurological  negative. Hematologic: Hematologic/lymphatic negative.  Psychiatric: Psychiatric negative.       Objective:  Objective   Vitals:   12/13/20 1517  BP: (!) 146/78  Pulse: 100  Resp: 20  Temp: 98 F (36.7 C)  SpO2: 98%  Weight: 133 lb (60.3 kg)  Height: 5' (1.524 m)   Body mass index is 25.97 kg/m.  Physical Exam HENT:     Head: Normocephalic.     Nose:     Comments: Wearing a mask Eyes:     Pupils: Pupils are equal, round, and reactive to light.  Cardiovascular:     Pulses:  Radial pulses are 2+ on the right side and 2+ on the left side.  Pulmonary:     Effort: Pulmonary effort is normal.  Abdominal:     General: Abdomen is flat.  Musculoskeletal:     Comments: Strong thrill right upper extremity medial arm  Neurological:     General: No focal deficit present.     Mental Status: She is alert.  Psychiatric:        Mood and Affect: Mood normal.    Data: Findings:  +--------------------+----------+-----------------+--------+  AVF                 PSV (cm/s)Flow Vol (mL/min)Comments  +--------------------+----------+-----------------+--------+  Native artery inflow   131           612                 +--------------------+----------+-----------------+--------+  AVF Anastomosis        807                               +--------------------+----------+-----------------+--------+      +------------+----------+-------------+----------+--------+  OUTFLOW VEINPSV (cm/s)Diameter (cm)Depth (cm)Describe  +------------+----------+-------------+----------+--------+  Mid UA          89        0.66        0.48             +------------+----------+-------------+----------+--------+  Dist UA     583 / 137     0.21        0.26             +------------+----------+-------------+----------+--------+  AC Fossa       454        0.33        0.46             +------------+----------+-------------+----------+--------+           Assessment/Plan:     65 year old female with previous first stage basilic vein in the right upper extremity.  She is on dialysis via catheter.  She has multiple questions about where to proceed from here and I discussed with her despite what appears to be low flow in her fistula in the upper arm she does have a very strong thrill and I have recommended conversion to a second stage basilic vein with transposition versus possible graft placement.  I discussed again with her the risk and benefits and I have given her literature on both dialysis as well as options for dialysis access.  I do think she will have more questions at the time of surgery and I have encouraged her to write these down and we can have them answered at that time.    Waynetta Sandy MD [facility]

## 2020-12-17 ENCOUNTER — Other Ambulatory Visit: Payer: Self-pay

## 2020-12-17 ENCOUNTER — Emergency Department (HOSPITAL_COMMUNITY)
Admission: EM | Admit: 2020-12-17 | Discharge: 2020-12-17 | Disposition: A | Discharge status: Home / Self Care | Payer: Medicare Other | Attending: Emergency Medicine | Admitting: Emergency Medicine

## 2020-12-17 ENCOUNTER — Encounter (HOSPITAL_COMMUNITY): Payer: Self-pay | Admitting: Emergency Medicine

## 2020-12-17 DIAGNOSIS — L299 Pruritus, unspecified: Secondary | ICD-10-CM | POA: Insufficient documentation

## 2020-12-17 DIAGNOSIS — Z7901 Long term (current) use of anticoagulants: Secondary | ICD-10-CM | POA: Diagnosis not present

## 2020-12-17 DIAGNOSIS — Z803 Family history of malignant neoplasm of breast: Secondary | ICD-10-CM | POA: Diagnosis not present

## 2020-12-17 DIAGNOSIS — Z992 Dependence on renal dialysis: Secondary | ICD-10-CM | POA: Insufficient documentation

## 2020-12-17 DIAGNOSIS — I120 Hypertensive chronic kidney disease with stage 5 chronic kidney disease or end stage renal disease: Secondary | ICD-10-CM | POA: Diagnosis not present

## 2020-12-17 DIAGNOSIS — N186 End stage renal disease: Secondary | ICD-10-CM | POA: Diagnosis not present

## 2020-12-17 LAB — COMPREHENSIVE METABOLIC PANEL
ALT: 16 U/L (ref 0–44)
AST: 24 U/L (ref 15–41)
Albumin: 3 g/dL — ABNORMAL LOW (ref 3.5–5.0)
Alkaline Phosphatase: 113 U/L (ref 38–126)
Anion gap: 10 (ref 5–15)
BUN: 13 mg/dL (ref 8–23)
CO2: 30 mmol/L (ref 22–32)
Calcium: 8 mg/dL — ABNORMAL LOW (ref 8.9–10.3)
Chloride: 91 mmol/L — ABNORMAL LOW (ref 98–111)
Creatinine, Ser: 3.8 mg/dL — ABNORMAL HIGH (ref 0.44–1.00)
GFR, Estimated: 13 mL/min — ABNORMAL LOW (ref >60)
Glucose, Bld: 104 mg/dL — ABNORMAL HIGH (ref 70–99)
Potassium: 3.8 mmol/L (ref 3.5–5.1)
Sodium: 131 mmol/L — ABNORMAL LOW (ref 135–145)
Total Bilirubin: 0.3 mg/dL (ref 0.3–1.2)
Total Protein: 7.3 g/dL (ref 6.5–8.1)

## 2020-12-17 LAB — CBC WITH DIFFERENTIAL/PLATELET
Abs Immature Granulocytes: 0.01 K/uL (ref 0.00–0.07)
Basophils Absolute: 0 K/uL (ref 0.0–0.1)
Basophils Relative: 1 %
Eosinophils Absolute: 0.2 K/uL (ref 0.0–0.5)
Eosinophils Relative: 4 %
HCT: 27.5 % — ABNORMAL LOW (ref 36.0–46.0)
Hemoglobin: 8.4 g/dL — ABNORMAL LOW (ref 12.0–15.0)
Immature Granulocytes: 0 %
Lymphocytes Relative: 23 %
Lymphs Abs: 1 K/uL (ref 0.7–4.0)
MCH: 27 pg (ref 26.0–34.0)
MCHC: 30.5 g/dL (ref 30.0–36.0)
MCV: 88.4 fL (ref 80.0–100.0)
Monocytes Absolute: 0.5 K/uL (ref 0.1–1.0)
Monocytes Relative: 11 %
Neutro Abs: 2.7 K/uL (ref 1.7–7.7)
Neutrophils Relative %: 61 %
Platelets: 128 K/uL — ABNORMAL LOW (ref 150–400)
RBC: 3.11 MIL/uL — ABNORMAL LOW (ref 3.87–5.11)
RDW: 14.9 % (ref 11.5–15.5)
WBC: 4.4 K/uL (ref 4.0–10.5)
nRBC: 0 % (ref 0.0–0.2)

## 2020-12-17 MED ORDER — HYDROXYZINE HCL 25 MG PO TABS: 25.0000 mg | ORAL_TABLET | Freq: Three times a day (TID) | ORAL | 0 refills | Status: DC | PRN

## 2020-12-17 MED ORDER — HYDROXYZINE HCL 25 MG PO TABS
25.0000 mg | ORAL_TABLET | Freq: Once | ORAL | Status: AC
  Administered 2020-12-17: 25 mg via ORAL
  Filled 2020-12-17: qty 1

## 2020-12-17 NOTE — Discharge Instructions (Signed)
You were seen in the emergency room today with itching.  I prescribed a medication to take as directed for itching symptoms.  Please discuss your symptoms with your dialysis team as well.  Return to the emergency department if you develop shortness of breath, chest pain, confusion, abdominal pain, vomiting, or other severe symptoms.

## 2020-12-17 NOTE — ED Provider Notes (Addendum)
Emergency Medicine Provider Triage Evaluation Note  Alicia [name] , a 65 y.o. female  was evaluated in triage.  Pt complains of itching all over for states her symptoms been ongoing for since 4pm yesterday.  Denies any new animals, soaps, detergents or travel.  New to dialysis  Review of Systems  Positive: itching Negative: fever  Physical Exam  BP 138/76 (BP Location: Left Arm)   Pulse (!) 101   Temp 98.6 F (37 C) (Oral)   Resp 16   SpO2 98%  Gen:   Awake, no distress   Resp:  Normal effort  MSK:   Moves extremities without difficulty  Other:    Medical Decision Making  Medically screening exam initiated at 7:26 AM.  Appropriate orders placed.  Dori Devino was informed that the remainder of the evaluation will be completed by another provider, this initial triage assessment does not replace that evaluation, and the importance of remaining in the ED until their evaluation is complete.  New dialysis patient is complaining of itching all over.  No missed sessions.  Basic labs. Will make sure the patient is not uremic.  Ultimately well-appearing.  Tedd Sias, [place] 12/17/20 0727    Tedd Sias, PA 12/17/20 8270    Lajean Saver, MD 12/17/20 1106

## 2020-12-17 NOTE — ED Provider Notes (Signed)
Emergency Department Provider Note   I have reviewed the triage vital signs and the nursing notes.   HISTORY  Chief Complaint Pruritis   HPI Alicia [name] is a 65 y.o. female with past medical history reviewed below including ESRD newly starting hemodialysis presents with total body itching.  Symptoms began shortly after her last dialysis session.  She is not having rash, lip swelling, throat swelling, shortness of breath, abdominal symptoms.  She notes persistent, diffuse body itching.  It is keeping her from sleeping.  She has been on dialysis for the past 3 months with no similar issues in the past.  She continues to make urine occasionally.  She is not having fevers.  No new medications.  No new detergents or soaps.  She has tried Benadryl with no relief.  Past Medical History:  Diagnosis Date   Ascites    Breast cancer ([place])    Cancer ([place])    Chronic anticoagulation    Cirrhosis ([place])    CKD (chronic kidney disease), stage III (HCC)    Dyspnea    with fluid buildup    Esophageal varices ([place]) 11/2016   Small noted on ENDO   GERD (gastroesophageal reflux disease)    Hypertension    Hypotension    Pneumonia    Portal hypertension ([place])     Patient Active Problem List   Diagnosis Date Noted   ESRD on dialysis ([place])    Anasarca associated with disorder of kidney    Acute renal failure superimposed on stage 5 chronic kidney disease, not on chronic dialysis ([place]) 08/30/2020   Normocytic anemia 08/30/2020   Hyperkalemia 08/30/2020   Prolonged QT interval 08/30/2020   Metastatic breast cancer ([place]) 10/01/2019   Focal lymphadenopathy 08/31/2019   Lymphadenopathy, axillary 08/31/2019   Mixed hyperlipidemia 05/28/2019   Special screening for malignant neoplasms, colon 05/25/2019   Hyperphosphatemia 04/23/2019   Hypocalcemia 04/20/2019   History of breast cancer 04/05/2019   Rectus sheath hematoma 11/07/2017   Abdominal wall hematoma 11/06/2017   Other cirrhosis  of liver ([place])    Secondary esophageal varices without bleeding ([place])    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   HTN (hypertension) 08/08/2016   Anemia in other chronic diseases classified elsewhere 08/08/2016   Stage 3 chronic kidney disease ([place]) 08/08/2016   Hypokalemia 08/08/2016   Gastroesophageal reflux disease    Portal hypertension ([place])    Ascites 08/07/2016    Past Surgical History:  Procedure Laterality Date   AV FISTULA PLACEMENT Right 09/01/2020   Procedure: RIGHT ARM ARTERIOVENOUS 1st STAGE BASILIC;  Surgeon: Marty Heck, MD;  Location: [facility];  Service: Vascular;  Laterality: Right;   BREAST LUMPECTOMY     BREAST SURGERY     CESAREAN SECTION     COLONOSCOPY WITH PROPOFOL N/A 05/25/2019   Procedure: COLONOSCOPY WITH PROPOFOL;  Surgeon: Wilford Corner, MD;  [facility];  Service: Endoscopy;  Laterality: N/A;   ESOPHAGOGASTRODUODENOSCOPY (EGD) WITH PROPOFOL N/A 11/21/2016   Procedure: ESOPHAGOGASTRODUODENOSCOPY;  Surgeon: Milus Banister, MD;  [facility];  Service: Endoscopy;  Laterality: N/A;   ESOPHAGOGASTRODUODENOSCOPY (EGD) WITH PROPOFOL N/A 05/25/2019   Procedure: ESOPHAGOGASTRODUODENOSCOPY (EGD) WITH PROPOFOL;  Surgeon: Wilford Corner, MD;  [facility];  Service: Endoscopy;  Laterality: N/A;   INSERTION OF DIALYSIS CATHETER Left 09/01/2020   Procedure: INSERTION OF A LEFT INTERNAL JUGULAR TUNNELLED DIALYSIS CATHETER. ULTRASOUND GUIDED;  Surgeon: Marty Heck, MD;  Location: [facility];  Service: Vascular;  Laterality: Left;   IR PARACENTESIS  09/04/2016   IR PARACENTESIS  09/20/2016   IR PARACENTESIS  10/22/2016   IR PARACENTESIS  01/27/2017   IR PARACENTESIS  04/11/2017   IR PARACENTESIS  06/05/2017   IR PARACENTESIS  06/05/2017   IR PARACENTESIS  06/19/2017   IR PARACENTESIS  07/03/2017   IR PARACENTESIS  07/17/2017   IR PARACENTESIS  08/01/2017   IR PARACENTESIS  08/14/2017   IR PARACENTESIS  08/28/2017    IR PARACENTESIS  09/11/2017   IR PARACENTESIS  09/25/2017   IR PARACENTESIS  10/08/2017   IR PARACENTESIS  10/23/2017   IR PARACENTESIS  11/06/2017   IR PARACENTESIS  11/20/2017   IR PARACENTESIS  12/04/2017   IR PARACENTESIS  12/18/2017   IR PARACENTESIS  01/19/2018   IR PARACENTESIS  02/09/2018   IR PARACENTESIS  02/24/2018   IR PARACENTESIS  03/16/2018   IR PARACENTESIS  04/10/2018   IR PARACENTESIS  05/08/2018   IR PARACENTESIS  06/11/2018   IR PARACENTESIS  07/07/2018   IR PARACENTESIS  09/04/2018   IR PARACENTESIS  10/22/2018   IR PARACENTESIS  11/30/2018   IR PARACENTESIS  01/14/2019   IR PARACENTESIS  04/30/2019   IR PARACENTESIS  07/19/2019   IR PARACENTESIS  09/05/2020   IR RADIOLOGIST EVAL & MGMT  12/23/2017   LYMPH NODE DISSECTION     ULTRASOUND GUIDANCE FOR VASCULAR ACCESS Left 09/01/2020   Procedure: ULTRASOUND GUIDANCE FOR VASCULAR ACCESS;  Surgeon: Marty Heck, MD;  Location: [facility];  Service: Vascular;  Laterality: Left;    Allergies Compazine [prochlorperazine edisylate], Flexeril [cyclobenzaprine], and Meclizine  Family History  Problem Relation Age of Onset   Diabetes Mother    Diabetes Father    Diabetes Brother    Colon cancer Neg Hx    Stomach cancer Neg Hx    Esophageal cancer Neg Hx     Social History Social History   Tobacco Use   Smoking status: Never   Smokeless tobacco: Never  Vaping Use   Vaping Use: Never used  Substance Use Topics   Alcohol use: Not Currently    Comment: occassional wine    Drug use: No    Review of Systems  Constitutional: No fever/chills Eyes: No visual changes. ENT: No sore throat. Cardiovascular: Denies chest pain. Respiratory: Denies shortness of breath. Gastrointestinal: No abdominal pain.  No nausea, no vomiting.  No diarrhea.  No constipation. Genitourinary: Negative for dysuria. Musculoskeletal: Negative for back pain. Skin: Negative for rash. Positive total body itching.  Neurological: Negative for headaches,  focal weakness or numbness.  10-point ROS otherwise negative.  ____________________________________________   PHYSICAL EXAM:  VITAL SIGNS: ED Triage Vitals  Enc Vitals Group     BP 12/17/20 0722 138/76     Pulse Rate 12/17/20 0722 (!) 101     Resp 12/17/20 0722 16     Temp 12/17/20 0722 98.6 F (37 C)     Temp Source 12/17/20 0722 Oral     SpO2 12/17/20 0722 98 %   Constitutional: Alert and oriented. Well appearing and in no acute distress. Eyes: Conjunctivae are normal.  Head: Atraumatic. Nose: No congestion/rhinnorhea. Mouth/Throat: Mucous membranes are moist.   Neck: No stridor.   Cardiovascular: Normal rate, regular rhythm. Good peripheral circulation. Grossly normal heart sounds.   Respiratory: Normal respiratory effort.   Gastrointestinal: No distention.  Musculoskeletal: No gross deformities of extremities. Neurologic:  Normal speech and language. Skin:  Skin is warm, dry and intact.  No rash noted.  ____________________________________________   LABS (all labs ordered are listed, but only abnormal results are displayed)  Labs Reviewed  COMPREHENSIVE METABOLIC PANEL - Abnormal; Notable for the following components:      Result Value   Sodium 131 (*)    Chloride 91 (*)    Glucose, Bld 104 (*)    Creatinine, Ser 3.80 (*)    Calcium 8.0 (*)    Albumin 3.0 (*)    GFR, Estimated 13 (*)    All other components within normal limits  CBC WITH DIFFERENTIAL/PLATELET - Abnormal; Notable for the following components:   RBC 3.11 (*)    Hemoglobin 8.4 (*)    HCT 27.5 (*)    Platelets 128 (*)    All other components within normal limits    ____________________________________________   PROCEDURES  Procedure(s) performed:   Procedures  None  ____________________________________________   INITIAL IMPRESSION / ASSESSMENT AND PLAN / ED COURSE  Pertinent labs & imaging results that were available during my care of the patient were reviewed by me and  considered in my medical decision making (see chart for details).   Patient presents to the emergency department with total body itching.  No urticaria or other rash.  No tongue or lip swelling.  No signs or symptoms to suspect anaphylaxis.  Lab work obtained during the [employer] process shows the patient is not uremic.  LFTs and bilirubin are within normal limits.  Plan for symptom relief with Atarax and she will discuss her itching symptoms with her nephrology/dialysis team. Next HD session Tuesday. No indication for emergent HD.    ____________________________________________  FINAL CLINICAL IMPRESSION(S) / ED DIAGNOSES  Final diagnoses:  Pruritus     MEDICATIONS GIVEN DURING THIS VISIT:  Medications  hydrOXYzine (ATARAX/VISTARIL) tablet 25 mg (has no administration in time range)     NEW OUTPATIENT MEDICATIONS STARTED DURING THIS VISIT:  New Prescriptions   HYDROXYZINE (ATARAX/VISTARIL) 25 MG TABLET    Take 1 tablet (25 mg total) by mouth every 8 (eight) hours as needed for itching.    Note:  This document was prepared using Dragon voice recognition software and may include unintentional dictation errors.  Nanda Quinton, MD, [facility] Emergency Medicine    Mischelle Reeg, [facility], MD 12/17/20 1140

## 2020-12-27 ENCOUNTER — Telehealth: Payer: Self-pay

## 2020-12-27 NOTE — Telephone Encounter (Signed)
Patient called requesting to cancel her surgery on 01/15/21. Patient stated she will be moving permanently to [place], [place] and will have surgery completed while there.

## 2020-12-29 NOTE — Telephone Encounter (Signed)
Patient called stating she changed her mind and would like to go ahead and schedule her surgery with Dr. Carlis Abbott in October before she moves to [facility] with her sister providing transportation to surgery. Patient scheduled for 01/12/21. Verbalized understanding to all instructions.

## 2021-01-10 ENCOUNTER — Encounter (HOSPITAL_COMMUNITY): Payer: Self-pay | Admitting: Vascular Surgery

## 2021-01-10 NOTE — Progress Notes (Signed)
DUE TO COVID-19 ONLY ONE VISITOR IS ALLOWED TO COME WITH YOU AND STAY IN THE WAITING ROOM ONLY DURING PRE OP AND PROCEDURE DAY OF SURGERY.   PCP - [name], PA-C Cardiologist - n/a Hematology and Oncology - Dr Jorje Guild  Chest x-ray - 09/01/20 (1V) EKG - 09/02/20 Stress Test - when she was 64yo, needed it for work clearance (normal per patient) ECHO - n/a Cardiac Cath - n/a  ICD Pacemaker/Loop - n/a  Sleep Study -  n/a CPAP - none  Anesthesia review: Yes  STOP now taking any Aspirin (unless otherwise instructed by your surgeon), Aleve, Naproxen, Ibuprofen, Motrin, Advil, Goody's, BC's, all herbal medications, fish oil, and all vitamins.   Coronavirus Screening Covid test n/a Ambulatory Surgery  Do you have any of the following symptoms:  Cough yes/no: No Fever (>100.35F)  yes/no: No Runny nose yes/no: No Sore throat yes/no: No Difficulty breathing/shortness of breath  yes/no: No  Have you traveled in the last 14 days and where? yes/no: No  Patient verbalized understanding of instructions that were given via phone.

## 2021-01-11 ENCOUNTER — Encounter (HOSPITAL_COMMUNITY): Payer: Self-pay | Admitting: Vascular Surgery

## 2021-01-11 NOTE — Anesthesia Preprocedure Evaluation (Addendum)
Anesthesia Evaluation  Patient identified by MRN, date of birth, ID band Patient awake    Reviewed: Allergy & Precautions, H&P , NPO status , Patient's Chart, lab work & pertinent test results  Airway Mallampati: I  TM Distance: >3 FB Neck ROM: Full    Dental no notable dental hx. (+) Teeth Intact, Poor Dentition, Dental Advisory Given   Pulmonary shortness of breath, pneumonia, Current Smoker and Patient abstained from smoking.,    Pulmonary exam normal breath sounds clear to auscultation       Cardiovascular Exercise Tolerance: Good hypertension, Pt. on medications Normal cardiovascular exam Rhythm:Regular Rate:Normal     Neuro/Psych  Headaches, Anxiety negative psych ROS   GI/Hepatic GERD  Medicated,(+) Cirrhosis   Esophageal Varices and ascites    ,   Endo/Other  negative endocrine ROS  Renal/GU ESRF and DialysisRenal disease  negative genitourinary   Musculoskeletal negative musculoskeletal ROS (+)   Abdominal   Peds negative pediatric ROS (+)  Hematology  (+) Blood dyscrasia, anemia ,   Anesthesia Other Findings   Reproductive/Obstetrics negative OB ROS                          Anesthesia Physical Anesthesia Plan  ASA: 4  Anesthesia Plan: General and Regional   Post-op Pain Management:  Regional for Post-op pain   Induction: Intravenous  PONV Risk Score and Plan: 2 and Ondansetron  Airway Management Planned: LMA  Additional Equipment: None  Intra-op Plan:   Post-operative Plan:   Informed Consent: I have reviewed the patients History and Physical, chart, labs and discussed the procedure including the risks, benefits and alternatives for the proposed anesthesia with the patient or authorized representative who has indicated his/her understanding and acceptance.       Plan Discussed with: CRNA, Surgeon and Anesthesiologist  Anesthesia Plan Comments: (PAT note  written 01/11/2021 by Myra Gianotti, PA-C. Patient is a 65 year old female scheduled for the above procedure. [facility] admission 08/30/20-09/05/20 for progressive CKD stage V with ESRD and recurrent ascites. S/p first stage basilic vein transposition and left IJ tunneled dialysis catheter 09/01/20 and started on hemodialysis.   History includes smoking, HTN (and hypotension), alcoholic cirrhosis (diagnosed 2017; with portal hypertension, esophageal varies; ascites requiring paracentesis ~ Q 3-4 weeks, last 01/05/21), dyspnea (when volume overloaded), ESRD (HD TTS), anemia (s/p PRBC), stomach cancer, stage IV breast cancer (diagnosed sage III 1992, s/p lumpectomy and chemoradiation for stage III; metastatic triple negative breast cancer 09/17/19 left axillary and intramammary LN, subcutaneous nodules, s/p chemotherapy, last 02/11/20 due to worsening CKD), rectus sheath hematoma (11/2017). )      Anesthesia Quick Evaluation

## 2021-01-11 NOTE — Progress Notes (Signed)
Anesthesia Chart Review: Alicia [name]   Case: [id number] Date/Time: 01/12/21 0715   Procedure: RIGHT ARM SECOND STAGE BASILIC VEIN TRANSPOSITION (Right)   Anesthesia type: Choice   Pre-op diagnosis: ESRD   Location: [facility] OR ROOM 11 / [facility]   Surgeons: Marty Heck, MD       DISCUSSION: Patient is a 65 year old female scheduled for the above procedure. [place] admission 08/30/20-09/05/20 for progressive CKD stage V with ESRD and recurrent ascites. S/p first stage basilic vein transposition and left IJ tunneled dialysis catheter 09/01/20 and started on hemodialysis.   History includes smoking, HTN (and hypotension), alcoholic cirrhosis (diagnosed 2017; with portal hypertension, esophageal varies; ascites requiring paracentesis ~ Q 3-4 weeks, last 01/05/21), dyspnea (when volume overloaded), ESRD (HD TTS), anemia (s/p PRBC), stomach cancer, stage IV breast cancer (diagnosed sage III 1992, s/p lumpectomy and chemoradiation for stage III; metastatic triple negative breast cancer 09/17/19 left axillary and intramammary LN, subcutaneous nodules, s/p chemotherapy, last 02/11/20 due to worsening CKD), rectus sheath hematoma (11/2017). In the past she was on Elqiuis for "high risk" for DVT (in setting of cancer), but discontinued 08/2016 in setting anemia, cirrhosis/ascites requiring paracentesis. Last alcohol documented as 11/2020.   Anesthesia team to evaluate on the day of surgery.    VS: Ht 5' (1.524 m)   Wt 65.3 kg   BMI 28.12 kg/m  BP Readings from Last 3 Encounters:  12/17/20 (!) 143/82  12/13/20 (!) 146/78  11/01/20 (!) 158/86   Pulse Readings from Last 3 Encounters:  12/17/20 87  12/13/20 100  11/01/20 (!) 54     PROVIDERS: Mindi Curling, PA-C is PCP   - Jorje Guild, MD is HEM-ONC ([place]). Last visit 01/03/21.   Roosevelt Locks, NP is hepatology provider ([place]). Last visit 01/01/21 for consideration of liver transplant. 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  Previous serologic testing was negative for viral hepatitis, genetic liver disease, and autoimmune liver disease.  She requires paracentesis every 3 to 4 weeks.  She previously drank heavily for 7 to 8 years (4 mixed drinks/day) with last alcohol then in 06/2020. MELD 21 (being driven by CKD on HD) CPT B.  She was not sure if patient was candidate for TIPS given history of renal failure. She also heard a murmur on exam (unclear if this could be referred from her AVF or if valvular disease present; no known history of echo).  Patient currently not interested in pursuing TIPS.  She was also not interested in pursuing an indwelling peritoneal catheter at that time either.  EGD in 2018 showed grade 1 esophageal varices and portal hypertensive gastropathy.  No history of variceal bleeding.  Currently managed on nadolol.  She notes "unfortunately patient is not a candidate for transplant due to history of metastatic breast cancer... Patient cirrhosis will continue to be managed by her referring gastroenterologist, I am available for consult in the future should the need arise."  - Gerri Spore, MD is GI [facility])   LABS: She his for ISTAT8 on arrival. Last H/H 8.2/25.4, PLT count 138K, glucose 94, AST 22, ALT 21, Na 132, K 5.1, Cr 4.25 on 01/03/21 ([facility]).    IMAGES: 1V PCXR 09/01/20: FINDINGS: - Interval tunneled left IJ hemodialysis catheter placement to the proximal right atrium. No pneumothorax. Stable right IJ port catheter. Lungs clear. Elevation of the right diaphragmatic leaflet as before. - Heart size and  mediastinal contours are within normal limits. - No effusion. - Visualized bones unremarkable. IMPRESSION: 1. Left IJ catheter to the proximal right atrium, no pneumothorax.   EKG: 09/02/20: Sinus rhythm with Premature atrial complexes Anteroseptal infarct , age undetermined Abnormal ECG No significant  change since last tracing Confirmed by Carlyle Dolly [phone]) on 09/02/2020 6:27:03 PM   CV: Remote history of normal stress test in her 20's.  Past Medical History:  Diagnosis Date   Anxiety    Ascites    Breast cancer ([place])    mets   Cancer ([place])    stomach   Chronic anticoagulation    reported on for DVT prophylaxis, possibly in setting of cancer, but Eliquis d/c'd 08/2016 in setting of anemia/liver disease/acites   Cirrhosis ([place])    CKD (chronic kidney disease), stage III ([place])    Dailysis T-TH-S   Dyspnea    with fluid buildup, no current problems 01/10/21   Esophageal varices ([place]) 11/2016   Small noted on ENDO   GERD (gastroesophageal reflux disease)    Headache    otc med prn   History of blood transfusion 08/2020   Hypertension    Hypotension    Pneumonia    Portal hypertension ([place])     Past Surgical History:  Procedure Laterality Date   AV FISTULA PLACEMENT Right 09/01/2020   Procedure: RIGHT ARM ARTERIOVENOUS 1st STAGE BASILIC;  Surgeon: Marty Heck, MD;  Location: [facility];  Service: Vascular;  Laterality: Right;   BREAST LUMPECTOMY     BREAST SURGERY     CESAREAN SECTION     COLONOSCOPY WITH PROPOFOL N/A 05/25/2019   Procedure: COLONOSCOPY WITH PROPOFOL;  Surgeon: Wilford Corner, MD;  [facility];  Service: Endoscopy;  Laterality: N/A;   ESOPHAGOGASTRODUODENOSCOPY (EGD) WITH PROPOFOL N/A 11/21/2016   Procedure: ESOPHAGOGASTRODUODENOSCOPY;  Surgeon: Milus Banister, MD;  [facility];  Service: Endoscopy;  Laterality: N/A;   ESOPHAGOGASTRODUODENOSCOPY (EGD) WITH PROPOFOL N/A 05/25/2019   Procedure: ESOPHAGOGASTRODUODENOSCOPY (EGD) WITH PROPOFOL;  Surgeon: Wilford Corner, MD;  [facility];  Service: Endoscopy;  Laterality: N/A;   INSERTION OF DIALYSIS CATHETER Left 09/01/2020   Procedure: INSERTION OF A LEFT INTERNAL JUGULAR TUNNELLED DIALYSIS CATHETER. ULTRASOUND GUIDED;  Surgeon: Marty Heck, MD;  Location:  [facility];  Service: Vascular;  Laterality: Left;   IR PARACENTESIS  09/04/2016   IR PARACENTESIS  09/20/2016   IR PARACENTESIS  10/22/2016   IR PARACENTESIS  01/27/2017   IR PARACENTESIS  04/11/2017   IR PARACENTESIS  06/05/2017   IR PARACENTESIS  06/05/2017   IR PARACENTESIS  06/19/2017   IR PARACENTESIS  07/03/2017   IR PARACENTESIS  07/17/2017   IR PARACENTESIS  08/01/2017   IR PARACENTESIS  08/14/2017   IR PARACENTESIS  08/28/2017   IR PARACENTESIS  09/11/2017   IR PARACENTESIS  09/25/2017   IR PARACENTESIS  10/08/2017   IR PARACENTESIS  10/23/2017   IR PARACENTESIS  11/06/2017   IR PARACENTESIS  11/20/2017   IR PARACENTESIS  12/04/2017   IR PARACENTESIS  12/18/2017   IR PARACENTESIS  01/19/2018   IR PARACENTESIS  02/09/2018   IR PARACENTESIS  02/24/2018   IR PARACENTESIS  03/16/2018   IR PARACENTESIS  04/10/2018   IR PARACENTESIS  05/08/2018   IR PARACENTESIS  06/11/2018   IR PARACENTESIS  07/07/2018   IR PARACENTESIS  09/04/2018   IR PARACENTESIS  10/22/2018   IR PARACENTESIS  11/30/2018   IR PARACENTESIS  01/14/2019   IR PARACENTESIS  04/30/2019   IR PARACENTESIS  07/19/2019   IR PARACENTESIS  09/05/2020   IR RADIOLOGIST EVAL & MGMT  12/23/2017   LYMPH NODE DISSECTION     ULTRASOUND GUIDANCE FOR VASCULAR ACCESS Left 09/01/2020   Procedure: ULTRASOUND GUIDANCE FOR VASCULAR ACCESS;  Surgeon: Marty Heck, MD;  Location: [facility];  Service: Vascular;  Laterality: Left;    MEDICATIONS: No current facility-administered medications for this encounter.    acetaminophen (TYLENOL) 500 MG tablet   amLODipine (NORVASC) 10 MG tablet   aspirin-acetaminophen-caffeine (EXCEDRIN MIGRAINE) 250-250-65 MG tablet   calcium carbonate (TUMS - DOSED IN MG ELEMENTAL CALCIUM) 500 MG chewable tablet   CALCIUM PO   ferrous sulfate 325 (65 FE) MG tablet   furosemide (LASIX) 40 MG tablet   hydrALAZINE (APRESOLINE) 100 MG tablet   hydrOXYzine (ATARAX/VISTARIL) 25 MG tablet   loperamide (IMODIUM) 2 MG capsule   Magnesium  Oxide 250 MG TABS   omeprazole (PRILOSEC) 20 MG capsule   sevelamer carbonate (RENVELA) 800 MG tablet   sodium chloride (OCEAN) 0.65 % SOLN nasal spray   spironolactone (ALDACTONE) 25 MG tablet   calcitRIOL (ROCALTROL) 0.5 MCG capsule    Myra Gianotti, PA-C Surgical Short Stay/Anesthesiology [facility] Phone [phone] [facility] Phone [phone] 01/11/2021 1:06 PM

## 2021-01-12 ENCOUNTER — Ambulatory Visit (HOSPITAL_COMMUNITY): Payer: Medicare Other | Admitting: Vascular Surgery

## 2021-01-12 ENCOUNTER — Ambulatory Visit (HOSPITAL_COMMUNITY)
Admission: RE | Admit: 2021-01-12 | Discharge: 2021-01-12 | Disposition: A | Discharge status: Home / Self Care | Payer: Medicare Other | Attending: Vascular Surgery | Admitting: Vascular Surgery

## 2021-01-12 ENCOUNTER — Encounter (HOSPITAL_COMMUNITY)
Admission: RE | Disposition: A | Discharge status: Home / Self Care | Payer: Self-pay | Source: Home / Self Care | Attending: Vascular Surgery

## 2021-01-12 ENCOUNTER — Encounter (HOSPITAL_COMMUNITY): Payer: Self-pay | Admitting: Vascular Surgery

## 2021-01-12 DIAGNOSIS — F17200 Nicotine dependence, unspecified, uncomplicated: Secondary | ICD-10-CM | POA: Diagnosis not present

## 2021-01-12 DIAGNOSIS — Z85028 Personal history of other malignant neoplasm of stomach: Secondary | ICD-10-CM | POA: Insufficient documentation

## 2021-01-12 DIAGNOSIS — D631 Anemia in chronic kidney disease: Secondary | ICD-10-CM | POA: Insufficient documentation

## 2021-01-12 DIAGNOSIS — Z800 Family history of malignant neoplasm of digestive organs: Secondary | ICD-10-CM

## 2021-01-12 DIAGNOSIS — Z888 Allergy status to other drugs, medicaments and biological substances status: Secondary | ICD-10-CM | POA: Diagnosis not present

## 2021-01-12 DIAGNOSIS — K766 Portal hypertension: Secondary | ICD-10-CM | POA: Diagnosis not present

## 2021-01-12 DIAGNOSIS — Z992 Dependence on renal dialysis: Secondary | ICD-10-CM | POA: Insufficient documentation

## 2021-01-12 DIAGNOSIS — Z9221 Personal history of antineoplastic chemotherapy: Secondary | ICD-10-CM | POA: Insufficient documentation

## 2021-01-12 DIAGNOSIS — Z79899 Other long term (current) drug therapy: Secondary | ICD-10-CM | POA: Insufficient documentation

## 2021-01-12 DIAGNOSIS — I120 Hypertensive chronic kidney disease with stage 5 chronic kidney disease or end stage renal disease: Secondary | ICD-10-CM | POA: Insufficient documentation

## 2021-01-12 DIAGNOSIS — Z923 Personal history of irradiation: Secondary | ICD-10-CM | POA: Diagnosis not present

## 2021-01-12 DIAGNOSIS — Z7901 Long term (current) use of anticoagulants: Secondary | ICD-10-CM | POA: Insufficient documentation

## 2021-01-12 DIAGNOSIS — K7030 Alcoholic cirrhosis of liver without ascites: Secondary | ICD-10-CM | POA: Insufficient documentation

## 2021-01-12 DIAGNOSIS — N186 End stage renal disease: Secondary | ICD-10-CM

## 2021-01-12 DIAGNOSIS — Z853 Personal history of malignant neoplasm of breast: Secondary | ICD-10-CM | POA: Insufficient documentation

## 2021-01-12 DIAGNOSIS — Z833 Family history of diabetes mellitus: Secondary | ICD-10-CM

## 2021-01-12 HISTORY — PX: BASCILIC VEIN TRANSPOSITION: SHX5742

## 2021-01-12 HISTORY — DX: Headache, unspecified: R51.9

## 2021-01-12 HISTORY — DX: Anxiety disorder, unspecified: F41.9

## 2021-01-12 LAB — POCT I-STAT, CHEM 8
BUN: 23 mg/dL (ref 8–23)
Calcium, Ion: 0.99 mmol/L — ABNORMAL LOW (ref 1.15–1.40)
Chloride: 92 mmol/L — ABNORMAL LOW (ref 98–111)
Creatinine, Ser: 4.3 mg/dL — ABNORMAL HIGH (ref 0.44–1.00)
Glucose, Bld: 94 mg/dL (ref 70–99)
HCT: 28 % — ABNORMAL LOW (ref 36.0–46.0)
Hemoglobin: 9.5 g/dL — ABNORMAL LOW (ref 12.0–15.0)
Potassium: 4.1 mmol/L (ref 3.5–5.1)
Sodium: 131 mmol/L — ABNORMAL LOW (ref 135–145)
TCO2: 29 mmol/L (ref 22–32)

## 2021-01-12 SURGERY — TRANSPOSITION, VEIN, BASILIC
Anesthesia: Regional | Site: Arm Upper | Laterality: Right

## 2021-01-12 MED ORDER — HEPARIN 6000 UNIT IRRIGATION SOLUTION
Status: AC
  Filled 2021-01-12: qty 500

## 2021-01-12 MED ORDER — CHLORHEXIDINE GLUCONATE 4 % EX LIQD: 60.0000 mL | Freq: Once | CUTANEOUS | Status: DC

## 2021-01-12 MED ORDER — ROPIVACAINE HCL 7.5 MG/ML IJ SOLN
INTRAMUSCULAR | Status: DC | PRN
  Administered 2021-01-12: 20 mL via PERINEURAL

## 2021-01-12 MED ORDER — CHLORHEXIDINE GLUCONATE 0.12 % MT SOLN
15.0000 mL | Freq: Once | OROMUCOSAL | Status: AC
  Administered 2021-01-12: 15 mL via OROMUCOSAL
  Filled 2021-01-12: qty 15

## 2021-01-12 MED ORDER — MIDAZOLAM HCL 2 MG/2ML IJ SOLN
INTRAMUSCULAR | Status: AC
  Filled 2021-01-12: qty 2

## 2021-01-12 MED ORDER — HEPARIN SODIUM (PORCINE) 1000 UNIT/ML IJ SOLN
INTRAMUSCULAR | Status: DC | PRN
  Administered 2021-01-12: 3000 [IU] via INTRAVENOUS

## 2021-01-12 MED ORDER — LIDOCAINE 2% (20 MG/ML) 5 ML SYRINGE
INTRAMUSCULAR | Status: AC
  Filled 2021-01-12: qty 5

## 2021-01-12 MED ORDER — PHENYLEPHRINE 40 MCG/ML (10ML) SYRINGE FOR IV PUSH (FOR BLOOD PRESSURE SUPPORT)
PREFILLED_SYRINGE | INTRAVENOUS | Status: DC | PRN
  Administered 2021-01-12 (×2): 80 ug via INTRAVENOUS

## 2021-01-12 MED ORDER — OXYCODONE-ACETAMINOPHEN 5-325 MG PO TABS: 1.0000 | ORAL_TABLET | Freq: Four times a day (QID) | ORAL | 0 refills | Status: DC | PRN

## 2021-01-12 MED ORDER — FENTANYL CITRATE (PF) 250 MCG/5ML IJ SOLN
INTRAMUSCULAR | Status: AC
  Filled 2021-01-12: qty 5

## 2021-01-12 MED ORDER — DEXMEDETOMIDINE (PRECEDEX) IN NS 20 MCG/5ML (4 MCG/ML) IV SYRINGE
PREFILLED_SYRINGE | INTRAVENOUS | Status: DC | PRN
  Administered 2021-01-12: 8 ug via INTRAVENOUS

## 2021-01-12 MED ORDER — GLYCOPYRROLATE PF 0.2 MG/ML IJ SOSY
PREFILLED_SYRINGE | INTRAMUSCULAR | Status: AC
  Filled 2021-01-12: qty 1

## 2021-01-12 MED ORDER — GLYCOPYRROLATE 0.2 MG/ML IJ SOLN
INTRAMUSCULAR | Status: DC | PRN
  Administered 2021-01-12: .2 mg via INTRAVENOUS

## 2021-01-12 MED ORDER — PHENYLEPHRINE 40 MCG/ML (10ML) SYRINGE FOR IV PUSH (FOR BLOOD PRESSURE SUPPORT)
PREFILLED_SYRINGE | INTRAVENOUS | Status: AC
  Filled 2021-01-12: qty 10

## 2021-01-12 MED ORDER — LIDOCAINE-EPINEPHRINE (PF) 1 %-1:200000 IJ SOLN
INTRAMUSCULAR | Status: AC
  Filled 2021-01-12: qty 30

## 2021-01-12 MED ORDER — PROPOFOL 10 MG/ML IV BOLUS
INTRAVENOUS | Status: AC
  Filled 2021-01-12: qty 40

## 2021-01-12 MED ORDER — PROPOFOL 10 MG/ML IV BOLUS
INTRAVENOUS | Status: DC | PRN
  Administered 2021-01-12 (×2): 20 mg via INTRAVENOUS

## 2021-01-12 MED ORDER — SODIUM CHLORIDE 0.9 % IV SOLN: INTRAVENOUS | Status: DC

## 2021-01-12 MED ORDER — PROPOFOL 500 MG/50ML IV EMUL
INTRAVENOUS | Status: DC | PRN
  Administered 2021-01-12: 200 ug/kg/min via INTRAVENOUS

## 2021-01-12 MED ORDER — FENTANYL CITRATE (PF) 250 MCG/5ML IJ SOLN
INTRAMUSCULAR | Status: DC | PRN
  Administered 2021-01-12 (×2): 50 ug via INTRAVENOUS

## 2021-01-12 MED ORDER — PROPOFOL 1000 MG/100ML IV EMUL
INTRAVENOUS | Status: AC
  Filled 2021-01-12: qty 100

## 2021-01-12 MED ORDER — ONDANSETRON HCL 4 MG/2ML IJ SOLN
INTRAMUSCULAR | Status: AC
  Filled 2021-01-12: qty 2

## 2021-01-12 MED ORDER — ONDANSETRON HCL 4 MG/2ML IJ SOLN
INTRAMUSCULAR | Status: DC | PRN
  Administered 2021-01-12: 4 mg via INTRAVENOUS

## 2021-01-12 MED ORDER — HEPARIN 6000 UNIT IRRIGATION SOLUTION
Status: DC | PRN
  Administered 2021-01-12: 1

## 2021-01-12 MED ORDER — 0.9 % SODIUM CHLORIDE (POUR BTL) OPTIME
TOPICAL | Status: DC | PRN
  Administered 2021-01-12: 1000 mL

## 2021-01-12 MED ORDER — MIDAZOLAM HCL 5 MG/5ML IJ SOLN
INTRAMUSCULAR | Status: DC | PRN
  Administered 2021-01-12 (×2): 1 mg via INTRAVENOUS

## 2021-01-12 MED ORDER — HEPARIN SODIUM (PORCINE) 1000 UNIT/ML IJ SOLN
INTRAMUSCULAR | Status: AC
  Filled 2021-01-12: qty 1

## 2021-01-12 MED ORDER — LIDOCAINE 2% (20 MG/ML) 5 ML SYRINGE
INTRAMUSCULAR | Status: DC | PRN
  Administered 2021-01-12: 60 mg via INTRAVENOUS

## 2021-01-12 MED ORDER — PHENYLEPHRINE HCL-NACL 20-0.9 MG/250ML-% IV SOLN
INTRAVENOUS | Status: DC | PRN
  Administered 2021-01-12: 30 ug/min via INTRAVENOUS

## 2021-01-12 MED ORDER — LIDOCAINE HCL (PF) 1 % IJ SOLN
INTRAMUSCULAR | Status: AC
  Filled 2021-01-12: qty 30

## 2021-01-12 MED ORDER — ORAL CARE MOUTH RINSE: 15.0000 mL | Freq: Once | OROMUCOSAL | Status: AC

## 2021-01-12 MED ORDER — CEFAZOLIN SODIUM-DEXTROSE 2-4 GM/100ML-% IV SOLN
2.0000 g | INTRAVENOUS | Status: AC
  Administered 2021-01-12: 2 g via INTRAVENOUS
  Filled 2021-01-12: qty 100

## 2021-01-12 SURGICAL SUPPLY — 36 items
ARMBAND PINK RESTRICT EXTREMIT (MISCELLANEOUS) ×2 IMPLANT
BAG COUNTER SPONGE SURGICOUNT (BAG) ×2 IMPLANT
CANISTER SUCT 3000ML PPV (MISCELLANEOUS) ×2 IMPLANT
CLIP VESOCCLUDE MED 24/CT (CLIP) ×2 IMPLANT
CLIP VESOCCLUDE SM WIDE 24/CT (CLIP) ×2 IMPLANT
COVER PROBE W GEL 5X96 (DRAPES) ×2 IMPLANT
DECANTER SPIKE VIAL GLASS SM (MISCELLANEOUS) ×2 IMPLANT
DERMABOND ADVANCED (GAUZE/BANDAGES/DRESSINGS) ×1
DERMABOND ADVANCED .7 DNX12 (GAUZE/BANDAGES/DRESSINGS) ×1 IMPLANT
ELECT REM PT RETURN 9FT ADLT (ELECTROSURGICAL) ×2
ELECTRODE REM PT RTRN 9FT ADLT (ELECTROSURGICAL) ×1 IMPLANT
GLOVE SRG 8 PF TXTR STRL LF DI (GLOVE) ×1 IMPLANT
GLOVE SURG ENC MOIS LTX SZ7.5 (GLOVE) ×2 IMPLANT
GLOVE SURG UNDER POLY LF SZ8 (GLOVE) ×1
GOWN STRL REUS W/ TWL LRG LVL3 (GOWN DISPOSABLE) ×2 IMPLANT
GOWN STRL REUS W/ TWL XL LVL3 (GOWN DISPOSABLE) ×2 IMPLANT
GOWN STRL REUS W/TWL LRG LVL3 (GOWN DISPOSABLE) ×2
GOWN STRL REUS W/TWL XL LVL3 (GOWN DISPOSABLE) ×2
HEMOSTAT SPONGE AVITENE ULTRA (HEMOSTASIS) IMPLANT
KIT BASIN OR (CUSTOM PROCEDURE TRAY) ×2 IMPLANT
KIT TURNOVER KIT B (KITS) ×2 IMPLANT
NEEDLE HYPO 25GX1X1/2 BEV (NEEDLE) ×2 IMPLANT
NS IRRIG 1000ML POUR BTL (IV SOLUTION) ×2 IMPLANT
PACK CV ACCESS (CUSTOM PROCEDURE TRAY) ×2 IMPLANT
PAD ARMBOARD 7.5X6 YLW CONV (MISCELLANEOUS) ×4 IMPLANT
SUT MNCRL AB 4-0 PS2 18 (SUTURE) ×4 IMPLANT
SUT PROLENE 6 0 BV (SUTURE) ×6 IMPLANT
SUT PROLENE 7 0 BV 1 (SUTURE) IMPLANT
SUT SILK 2 0 SH (SUTURE) ×2 IMPLANT
SUT VIC AB 2-0 CT1 27 (SUTURE) ×2
SUT VIC AB 2-0 CT1 TAPERPNT 27 (SUTURE) ×2 IMPLANT
SUT VIC AB 3-0 SH 27 (SUTURE) ×2
SUT VIC AB 3-0 SH 27X BRD (SUTURE) ×2 IMPLANT
TOWEL GREEN STERILE (TOWEL DISPOSABLE) ×2 IMPLANT
UNDERPAD 30X36 HEAVY ABSORB (UNDERPADS AND DIAPERS) ×2 IMPLANT
WATER STERILE IRR 1000ML POUR (IV SOLUTION) ×2 IMPLANT

## 2021-01-12 NOTE — Discharge Instructions (Addendum)
[facility]  Discharge Instructions  AV Fistula or Graft Surgery for Dialysis Access  Please refer to the following instructions for your post-procedure care. Your surgeon or physician assistant will discuss any changes with you.  Activity  You may drive the day following your surgery, if you are comfortable and no longer taking prescription pain medication. Resume full activity as the soreness in your incision resolves.  Bathing/Showering  You may shower after you go home. Keep your incision dry for 48 hours. Do not soak in a bathtub, hot tub, or swim until the incision heals completely. You may not shower if you have a hemodialysis catheter.  Incision Care  Clean your incision with mild soap and water after 48 hours. Pat the area dry with a clean towel. You do not need a bandage unless otherwise instructed. Do not apply any ointments or creams to your incision. You may have skin glue on your incision. Do not peel it off. It will come off on its own in about one week. Your arm may swell a bit after surgery. To reduce swelling use pillows to elevate your arm so it is above your heart. Your doctor will tell you if you need to lightly wrap your arm with an ACE bandage.  Diet  Resume your normal diet. There are not special food restrictions following this procedure. In order to heal from your surgery, it is CRITICAL to get adequate nutrition. Your body requires vitamins, minerals, and protein. Vegetables are the best source of vitamins and minerals. Vegetables also provide the perfect balance of protein. Processed food has little nutritional value, so try to avoid this.  Medications  Resume taking all of your medications. If your incision is causing pain, you may take over-the counter pain relievers such as acetaminophen (Tylenol). If you were prescribed a stronger pain medication, please be aware these medications can cause nausea and constipation. Prevent  nausea by taking the medication with a snack or meal. Avoid constipation by drinking plenty of fluids and eating foods with high amount of fiber, such as fruits, vegetables, and grains.  Do not take Tylenol if you are taking prescription pain medications.  Follow up Your surgeon may want to see you in the office following your access surgery. If so, this will be arranged at the time of your surgery.  Please call us immediately for any of the following conditions:  Increased pain, redness, drainage (pus) from your incision site Fever of 101 degrees or higher Severe or worsening pain at your incision site Hand pain or numbness.  Reduce your risk of vascular disease:  Stop smoking. If you would like help, call [facility] at 1-800-QUIT-NOW 725-553-2317) or [place] at [place] your cholesterol Maintain a desired weight Control your diabetes Keep your blood pressure down  Dialysis  It will take several weeks to several months for your new dialysis access to be ready for use. Your surgeon will determine when it is okay to use it. Your nephrologist will continue to direct your dialysis. You can continue to use your Permcath until your new access is ready for use.   01/12/2021 Alicia [name] [id number] 28-Sep-1955  Surgeon(s): Marty Heck, MD  Procedure(s): RIGHT ARM SECOND STAGE BASILIC VEIN TRANSPOSITION   May stick graft immediately   May stick graft on designated area only:   X Do not stick Right AV Fistula for 6 weeks    If you have any questions, please call the office  at [phone].

## 2021-01-12 NOTE — Progress Notes (Signed)
Orthopedic Tech Progress Note Patient Details:  Alicia [name] 10-01-1955 [id number]  Ortho Devices Type of Ortho Device: Arm sling Ortho Device/Splint Interventions: Ordered      [name] A [name] 01/12/2021, 10:33 AM

## 2021-01-12 NOTE — Anesthesia Postprocedure Evaluation (Signed)
Anesthesia Post Note  Patient: Ifeoma Vallin  Procedure(s) Performed: RIGHT ARM SECOND STAGE BASILIC VEIN TRANSPOSITION (Right: Arm Upper)     Patient location during evaluation: PACU Anesthesia Type: Regional and MAC Level of consciousness: awake and alert Pain management: pain level controlled Vital Signs Assessment: post-procedure vital signs reviewed and stable Respiratory status: spontaneous breathing, nonlabored ventilation, respiratory function stable and patient connected to nasal cannula oxygen Cardiovascular status: stable and blood pressure returned to baseline Postop Assessment: no apparent nausea or vomiting Anesthetic complications: no   No notable events documented.  Last Vitals:  Vitals:   01/12/21 0950 01/12/21 1006  BP: 113/88 110/70  Pulse: 95   Resp: (!) 24   Temp: (!) 36.1 C   SpO2: 98%     Last Pain:  Vitals:   01/12/21 1006  TempSrc:   PainSc: 0-No pain                 Bauer Ausborn

## 2021-01-12 NOTE — Anesthesia Procedure Notes (Signed)
Anesthesia Regional Block: Supraclavicular block   Pre-Anesthetic Checklist: , timeout performed,  Correct Patient, Correct Site, Correct Laterality,  Correct Procedure, Correct Position, site marked,  Risks and benefits discussed,  Surgical consent,  Pre-op evaluation,  At surgeon's request and post-op pain management  Laterality: Right  Prep: chloraprep       Needles:  Injection technique: Single-shot  Needle Type: Echogenic Stimulator Needle     Needle Length: 5cm  Needle Gauge: 22     Additional Needles:   Procedures:, nerve stimulator,,, ultrasound used (permanent image in chart),,     Nerve Stimulator or Paresthesia:  Response: hand, 0.45 mA  Additional Responses:   Narrative:  Start time: 01/12/2021 7:15 AM End time: 01/12/2021 7:20 AM Injection made incrementally with aspirations every 5 mL.  Performed by: Personally  Anesthesiologist: Janeece Riggers, MD  Additional Notes: Functioning IV was confirmed and monitors were applied.  A 51mm 22ga Arrow echogenic stimulator needle was used. Sterile prep and drape,hand hygiene and sterile gloves were used. Ultrasound guidance: relevant anatomy identified, needle position confirmed, local anesthetic spread visualized around nerve(s)., vascular puncture avoided.  Image printed for medical record. Negative aspiration and negative test dose prior to incremental administration of local anesthetic. The patient tolerated the procedure well.

## 2021-01-12 NOTE — H&P (Signed)
History and Physical Interval Note:  01/12/2021 7:38 AM  Alicia [name] presented today for surgery, with the diagnosis of END STAGE RENAL DISEASE.  The various methods of treatment have been discussed with the patient and family. After consideration of risks, benefits and other options for treatment, the patient [name] consented to  Procedure(s): RIGHT ARM SECOND STAGE BASILIC VEIN TRANSPOSITION (Right) as a surgical intervention.  The patient's history [name] been reviewed, patient examined, no change in status, stable for surgery.  I have reviewed the patient's chart and labs.  Questions were answered to the patient's satisfaction.    Right 2nd stage BVT  Marty Heck  Patient ID: Alicia [name], female   DOB: February 07, 1956, 65 y.o.   MRN: [id number]   Reason for Consult: Follow-up   Referred by Mindi Curling, PA-C   Subjective:    Subjective [] Expand by Default   HPI:   Alicia [name] is a 65 y.o. female history of end-stage renal disease currently on dialysis via tunneled dialysis catheter.  Alicia [name] had a right arm first stage basilic vein placed.  Her right arm [name] been fine.  Alicia [name] [name] had multiple questions about this and in fact was seen as a virtual visit 2 weeks ago in our office to have her questions answered.  Alicia [name] now [name] more questions regarding her fistula and is here today to discuss.       Past Medical History:  Diagnosis Date   Ascites     Breast cancer ([place])     Cancer ([place])     Chronic anticoagulation     Cirrhosis ([facility])     CKD (chronic kidney disease), stage III ([facility])     Dyspnea      with fluid buildup    Esophageal varices ([facility]) 11/2016    Small noted on ENDO   GERD (gastroesophageal reflux disease)     Hypertension     Hypotension     Pneumonia     Portal hypertension ([place])           Family History  Problem Relation Age of Onset   Diabetes Mother     Diabetes Father     Diabetes Brother     Colon cancer Neg Hx     Stomach cancer Neg Hx      Esophageal cancer Neg Hx           Past Surgical History:  Procedure Laterality Date   AV FISTULA PLACEMENT Right 09/01/2020    Procedure: RIGHT ARM ARTERIOVENOUS 1st STAGE BASILIC;  Surgeon: Marty Heck, MD;  Location: [facility];  Service: Vascular;  Laterality: Right;   BREAST LUMPECTOMY       BREAST SURGERY       CESAREAN SECTION       COLONOSCOPY WITH PROPOFOL N/A 05/25/2019    Procedure: COLONOSCOPY WITH PROPOFOL;  Surgeon: Wilford Corner, MD;  [facility];  Service: Endoscopy;  Laterality: N/A;   ESOPHAGOGASTRODUODENOSCOPY (EGD) WITH PROPOFOL N/A 11/21/2016    Procedure: ESOPHAGOGASTRODUODENOSCOPY;  Surgeon: Milus Banister, MD;  [facility];  Service: Endoscopy;  Laterality: N/A;   ESOPHAGOGASTRODUODENOSCOPY (EGD) WITH PROPOFOL N/A 05/25/2019    Procedure: ESOPHAGOGASTRODUODENOSCOPY (EGD) WITH PROPOFOL;  Surgeon: Wilford Corner, MD;  [facility];  Service: Endoscopy;  Laterality: N/A;   INSERTION OF DIALYSIS CATHETER Left 09/01/2020    Procedure: INSERTION OF A LEFT INTERNAL JUGULAR TUNNELLED DIALYSIS CATHETER. ULTRASOUND GUIDED;  Surgeon: Marty Heck, MD;  Location: [place];  Service: Vascular;  Laterality: Left;   IR PARACENTESIS   09/04/2016   IR PARACENTESIS   09/20/2016   IR PARACENTESIS   10/22/2016   IR PARACENTESIS   01/27/2017   IR PARACENTESIS   04/11/2017   IR PARACENTESIS   06/05/2017   IR PARACENTESIS   06/05/2017   IR PARACENTESIS   06/19/2017   IR PARACENTESIS   07/03/2017   IR PARACENTESIS   07/17/2017   IR PARACENTESIS   08/01/2017   IR PARACENTESIS   08/14/2017   IR PARACENTESIS   08/28/2017   IR PARACENTESIS   09/11/2017   IR PARACENTESIS   09/25/2017   IR PARACENTESIS   10/08/2017   IR PARACENTESIS   10/23/2017   IR PARACENTESIS   11/06/2017   IR PARACENTESIS   11/20/2017   IR PARACENTESIS   12/04/2017   IR PARACENTESIS   12/18/2017   IR PARACENTESIS   01/19/2018   IR PARACENTESIS   02/09/2018   IR PARACENTESIS   02/24/2018    IR PARACENTESIS   03/16/2018   IR PARACENTESIS   04/10/2018   IR PARACENTESIS   05/08/2018   IR PARACENTESIS   06/11/2018   IR PARACENTESIS   07/07/2018   IR PARACENTESIS   09/04/2018   IR PARACENTESIS   10/22/2018   IR PARACENTESIS   11/30/2018   IR PARACENTESIS   01/14/2019   IR PARACENTESIS   04/30/2019   IR PARACENTESIS   07/19/2019   IR PARACENTESIS   09/05/2020   IR RADIOLOGIST EVAL & MGMT   12/23/2017   LYMPH NODE DISSECTION       ULTRASOUND GUIDANCE FOR VASCULAR ACCESS Left 09/01/2020    Procedure: ULTRASOUND GUIDANCE FOR VASCULAR ACCESS;  Surgeon: Marty Heck, MD;  Location: [facility];  Service: Vascular;  Laterality: Left;      Short Social History:  Social History         Tobacco Use   Smoking status: Never   Smokeless tobacco: Never  Substance Use Topics   Alcohol use: Not Currently      Comment: occassional wine           Allergies  Allergen Reactions   Compazine [Prochlorperazine Edisylate] Anxiety   Flexeril [Cyclobenzaprine] Anxiety   Meclizine Anxiety            Current Outpatient Medications  Medication Sig Dispense Refill   acetaminophen (TYLENOL) 500 MG tablet Take 1,000 mg by mouth every 6 (six) hours as needed for moderate pain or headache.       amLODipine (NORVASC) 10 MG tablet Take 10 mg by mouth daily.       aspirin-acetaminophen-caffeine (EXCEDRIN MIGRAINE) 250-250-65 MG tablet Take 2 tablets by mouth every 6 (six) hours as needed for headache.       calcitRIOL (ROCALTROL) 0.5 MCG capsule Take 0.5 mcg by mouth daily.       calcium carbonate (TUMS - DOSED IN MG ELEMENTAL CALCIUM) 500 MG chewable tablet Chew 2 tablets by mouth daily as needed for indigestion or heartburn.       CALCIUM PO Take 500 mg by mouth daily.       ferrous sulfate 325 (65 FE) MG tablet Take 325 mg by mouth daily with breakfast.       furosemide (LASIX) 40 MG tablet Take 40 mg by mouth daily.       hydrALAZINE (APRESOLINE) 100 MG tablet Take 100 mg by mouth 2 (two) times daily.  magnesium oxide (MAG-OX) 400 MG tablet Take 400 mg by mouth 2 (two) times daily.       omeprazole (PRILOSEC) 20 MG capsule Take 20 mg by mouth daily as needed (reflux).       sevelamer carbonate (RENVELA) 800 MG tablet Take 800 mg by mouth 3 (three) times daily with meals.       sodium chloride (OCEAN) 0.65 % SOLN nasal spray Place 1 spray into both nostrils as needed for congestion.       spironolactone (ALDACTONE) 25 MG tablet Take 25 mg by mouth daily.        No current facility-administered medications for this visit.      Review of Systems  Constitutional:  Constitutional negative. HENT: HENT negative.  Eyes: Eyes negative.  Respiratory: Respiratory negative.  Cardiovascular: Cardiovascular negative.  GI: Gastrointestinal negative.  Musculoskeletal: Musculoskeletal negative.  Skin: Skin negative.  Neurological: Neurological negative. Hematologic: Hematologic/lymphatic negative.  Psychiatric: Psychiatric negative.         Objective:    Objective [] Expand by Default        Vitals:    12/13/20 1517  BP: (!) 146/78  Pulse: 100  Resp: 20  Temp: 98 F (36.7 C)  SpO2: 98%  Weight: 133 lb (60.3 kg)  Height: 5' (1.524 m)    Body mass index is 25.97 kg/m.   Physical Exam HENT:     Head: Normocephalic.     Nose:     Comments: Wearing a mask Eyes:     Pupils: Pupils are equal, round, and reactive to light.  Cardiovascular:     Pulses:          Radial pulses are 2+ on the right side and 2+ on the left side.  Pulmonary:     Effort: Pulmonary effort is normal.  Abdominal:     General: Abdomen is flat.  Musculoskeletal:     Comments: Strong thrill right upper extremity medial arm  Neurological:     General: No focal deficit present.     Mental Status: Alicia [name] is alert.  Psychiatric:        Mood and Affect: Mood normal.      Data: Findings:  +--------------------+----------+-----------------+--------+  AVF                 PSV (cm/s)Flow Vol  (mL/min)Comments  +--------------------+----------+-----------------+--------+  Native artery inflow   131           612                 +--------------------+----------+-----------------+--------+  AVF Anastomosis        807                               +--------------------+----------+-----------------+--------+      +------------+----------+-------------+----------+--------+  OUTFLOW VEINPSV (cm/s)Diameter (cm)Depth (cm)Describe  +------------+----------+-------------+----------+--------+  Mid UA          89        0.66        0.48             +------------+----------+-------------+----------+--------+  Dist UA     583 / 137     0.21        0.26             +------------+----------+-------------+----------+--------+  AC Fossa       454        0.33        0.46             +------------+----------+-------------+----------+--------+  Assessment/Plan:    Assessment     65 year old female with previous first stage basilic vein in the right upper extremity.  Alicia [name] is on dialysis via catheter.  Alicia [name] [name] multiple questions about where to proceed from here and I discussed with her despite what appears to be low flow in her fistula in the upper arm Alicia [name] does have a very strong thrill and I have recommended conversion to a second stage basilic vein with transposition versus possible graft placement.  I discussed again with her the risk and benefits and I have given her literature on both dialysis as well as options for dialysis access.  I do think Alicia [name] will have more questions at the time of surgery and I have encouraged her to write these down and we can have them answered at that time.       Waynetta Sandy MD [facility]

## 2021-01-12 NOTE — Transfer of Care (Signed)
Immediate Anesthesia Transfer of Care Note  Patient: Alicia [name]  Procedure(s) Performed: RIGHT ARM SECOND STAGE BASILIC VEIN TRANSPOSITION (Right: Arm Upper)  Patient Location: PACU  Anesthesia Type:MAC  Level of Consciousness: awake  Airway & Oxygen Therapy: Patient Spontanous Breathing  Post-op Assessment: Report given to RN  Post vital signs: stable  Last Vitals:  Vitals Value Taken Time  BP 113/88 01/12/21 0951  Temp    Pulse 95 01/12/21 0952  Resp 17 01/12/21 0952  SpO2 96 % 01/12/21 0952  Vitals shown include unvalidated device data.  Last Pain:  Vitals:   01/12/21 0627  TempSrc:   PainSc: 0-No pain         Complications: No notable events documented.

## 2021-01-12 NOTE — Op Note (Signed)
    OPERATIVE NOTE   PROCEDURE: right second stage basilic vein transposition (brachiobasilic arteriovenous fistula) placement  PRE-OPERATIVE DIAGNOSIS: ESRD  POST-OPERATIVE DIAGNOSIS: same  SURGEON: Marty Heck, MD  ASSISTANT(S): OR Staff  ANESTHESIA: regional  ESTIMATED BLOOD LOSS: <75 mL  FINDING(S): The basilic vein was harvested through 3 skip incisions in the right upper arm.  It was transected near the antecubital fossa and transposed through a new skin tunnel and a new end-to-end anastomosis was performed.  I was able to pass #5 dilators through the vein without any resistance.  Excellent thrill at completion.  SPECIMEN(S):  None  INDICATIONS:   Alicia [name] is a 65 y.o. female who presents with ESRD and is scheduled for right second stage basilic vein transposition.  The patient is aware the risks include but are not limited to: bleeding, infection, steal syndrome, nerve damage, ischemic monomelic neuropathy, failure to mature, and need for additional procedures.  The patient is aware of the risks of the procedure and elects to proceed forward.   DESCRIPTION: After full informed written consent was obtained from the patient, the patient was brought back to the operating room and placed supine upon the operating table.  Prior to induction, the patient received IV antibiotics.   After obtaining adequate anesthesia, the patient was then prepped and draped in the standard fashion for a right arm access procedure.  I turned my attention first to identifying the patient's brachiobasilic arteriovenous fistula.  Using SonoSite guidance, the location of this fistula was marked out on the skin.    This was an excellent caliber vein.  I made three longitudinal incisions on the medial aspect of the right upper arm.  Through these incisions I dissected out circumferentially the basilic vein, taking care to protect the nerve.  Once the vein was fully mobilized, all side branches  were ligated between silk ties.  The vein was marked for orientation.  I then used a curved tunneler to create a subcutaneous tunnel.  The patient was given 3,000 units IV heparin.  The vein was then transected near the antecubital crease.  It was then brought to the previously created tunnel making sure to maintain proper orientation.  A primary anastomosis was then performed between the two cut ends of the vein after it was spatulated with a running 6-0 Prolene.  Once this was done the clamps were released.  There was excellent flow through the fistula.  Hemostasis was then achieved.  The wound was irrigated.  The incision was closed with a deep layer of 3-0 Vicryl followed by a subcutaneous 4-0 Monocryl and Dermabond.  There were no immediate complications.  COMPLICATIONS: None  CONDITION: Stable  Marty Heck, MD [facility] Office: [place]   01/12/2021, 9:40 AM

## 2021-01-13 ENCOUNTER — Encounter (HOSPITAL_COMMUNITY): Payer: Self-pay | Admitting: Vascular Surgery

## 2021-01-15 ENCOUNTER — Ambulatory Visit: Admit: 2021-01-15 | Payer: Medicare Other | Admitting: Vascular Surgery

## 2021-01-15 SURGERY — TRANSPOSITION, VEIN, BASILIC
Anesthesia: Choice | Laterality: Right

## 2021-01-26 ENCOUNTER — Telehealth: Payer: Self-pay

## 2021-01-26 NOTE — Telephone Encounter (Signed)
Patient calls today to report some tingling and numbness in her right lower arm s/p right second stage BVT on 10/7. Denies any pain, just "weird." She denies any coolness in her hand, and she is able to grip objects. Explained she may have some element of steal, but it was okay as long as symptoms didn't progress to wait until post op follow up. Instructed patient to call back if she developed ulcers or coldness in her hand or if symptoms became intolerable. Patient verbalizes understanding.

## 2021-01-29 ENCOUNTER — Telehealth: Payer: Self-pay

## 2021-01-29 NOTE — Telephone Encounter (Signed)
Patient is s/p 2nd stage R BVT on 01/12/2021. She called Friday to report numbness in her right arm. Since then the numbness has gotten much worse and she states "I am just miserable." She is still able to grasp objects and has grip strength - numbness does not really extend to her fingers. Placed on schedule for evaluation.

## 2021-01-30 ENCOUNTER — Encounter: Payer: Self-pay | Admitting: Vascular Surgery

## 2021-01-30 ENCOUNTER — Other Ambulatory Visit: Payer: Self-pay

## 2021-01-30 ENCOUNTER — Ambulatory Visit (INDEPENDENT_AMBULATORY_CARE_PROVIDER_SITE_OTHER): Payer: Medicare Other | Admitting: Vascular Surgery

## 2021-01-30 VITALS — BP 185/104 | HR 89 | Temp 97.9°F | Resp 14 | Ht 60.0 in | Wt 145.0 lb

## 2021-01-30 DIAGNOSIS — Z992 Dependence on renal dialysis: Secondary | ICD-10-CM

## 2021-01-30 DIAGNOSIS — N186 End stage renal disease: Secondary | ICD-10-CM

## 2021-01-30 NOTE — Progress Notes (Signed)
Patient name: Zoei Amison MRN: [id number] DOB: 1955/06/01 Sex: female  REASON FOR VISIT: Evaluate right hand numbness after second stage basilic vein transposition  HPI: Arneisha Kincannon is a 65 y.o. female with end-stage renal disease that presents for evaluation of right hand numbness after second stage basilic vein transposition.  She most recently underwent right second stage basilic vein transposition on 01/12/2021.  States she had some right hand numbness immediately after surgery that got better the next day.  She has had ongoing numbness in the right forearm.  Hand has good strength.  Past Medical History:  Diagnosis Date   Anxiety    Ascites    Breast cancer ([place])    mets   Cancer ([place])    stomach   Chronic anticoagulation    reported on for DVT prophylaxis, possibly in setting of cancer, but Eliquis d/c'd 08/2016 in setting of anemia/liver disease/acites   Cirrhosis ([place])    CKD (chronic kidney disease), stage III ([place])    Dailysis T-TH-S   Dyspnea    with fluid buildup, no current problems 01/10/21   Esophageal varices ([place]) 11/2016   Small noted on ENDO   GERD (gastroesophageal reflux disease)    Headache    otc med prn   History of blood transfusion 08/2020   Hypertension    Hypotension    Pneumonia    Portal hypertension ([place])     Past Surgical History:  Procedure Laterality Date   AV FISTULA PLACEMENT Right 09/01/2020   Procedure: RIGHT ARM ARTERIOVENOUS 1st STAGE BASILIC;  Surgeon: Marty Heck, MD;  Location: [place];  Service: Vascular;  Laterality: Right;   [place] Right 01/12/2021   Procedure: RIGHT ARM SECOND STAGE BASILIC VEIN TRANSPOSITION;  Surgeon: Marty Heck, MD;  Location: [place];  Service: Vascular;  Laterality: Right;   BREAST LUMPECTOMY     BREAST SURGERY     CESAREAN SECTION     COLONOSCOPY WITH PROPOFOL N/A 05/25/2019   Procedure: COLONOSCOPY WITH PROPOFOL;  Surgeon: Wilford Corner, MD;  Location: [facility]  ENDOSCOPY;  Service: Endoscopy;  Laterality: N/A;   ESOPHAGOGASTRODUODENOSCOPY (EGD) WITH PROPOFOL N/A 11/21/2016   Procedure: ESOPHAGOGASTRODUODENOSCOPY;  Surgeon: Milus Banister, MD;  Location: [facility] END[facility];  Service: Endoscopy;  Laterality: N/A;   ESOPHAGOGASTRODUODENOSCOPY (EGD) WITH PROPOFOL N/A 05/25/2019   Procedure: ESOPHAGOGASTRODUODENOSCOPY (EGD) WITH PROPOFOL;  Surgeon: Wilford Corner, MD;  Location: [facility];  Service: Endoscopy;  Laterality: N/A;   INSERTION OF DIALYSIS CATHETER Left 09/01/2020   Procedure: INSERTION OF A LEFT INTERNAL JUGULAR TUNNELLED DIALYSIS CATHETER. ULTRASOUND GUIDED;  Surgeon: Marty Heck, MD;  Location: [place];  Service: Vascular;  Laterality: Left;   IR PARACENTESIS  09/04/2016   IR PARACENTESIS  09/20/2016   IR PARACENTESIS  10/22/2016   IR PARACENTESIS  01/27/2017   IR PARACENTESIS  04/11/2017   IR PARACENTESIS  06/05/2017   IR PARACENTESIS  06/05/2017   IR PARACENTESIS  06/19/2017   IR PARACENTESIS  07/03/2017   IR PARACENTESIS  07/17/2017   IR PARACENTESIS  08/01/2017   IR PARACENTESIS  08/14/2017   IR PARACENTESIS  08/28/2017   IR PARACENTESIS  09/11/2017   IR PARACENTESIS  09/25/2017   IR PARACENTESIS  10/08/2017   IR PARACENTESIS  10/23/2017   IR PARACENTESIS  11/06/2017   IR PARACENTESIS  11/20/2017   IR PARACENTESIS  12/04/2017   IR PARACENTESIS  12/18/2017   IR PARACENTESIS  01/19/2018   IR PARACENTESIS  02/09/2018   IR PARACENTESIS  02/24/2018   IR PARACENTESIS  03/16/2018   IR PARACENTESIS  04/10/2018   IR PARACENTESIS  05/08/2018   IR PARACENTESIS  06/11/2018   IR PARACENTESIS  07/07/2018   IR PARACENTESIS  09/04/2018   IR PARACENTESIS  10/22/2018   IR PARACENTESIS  11/30/2018   IR PARACENTESIS  01/14/2019   IR PARACENTESIS  04/30/2019   IR PARACENTESIS  07/19/2019   IR PARACENTESIS  09/05/2020   IR RADIOLOGIST EVAL & MGMT  12/23/2017   LYMPH NODE DISSECTION     ULTRASOUND GUIDANCE FOR VASCULAR ACCESS Left 09/01/2020   Procedure: ULTRASOUND  GUIDANCE FOR VASCULAR ACCESS;  Surgeon: Marty Heck, MD;  Location: [facility] OR;  Service: Vascular;  Laterality: Left;    Family History  Problem Relation Age of Onset   Diabetes Mother    Diabetes Father    Diabetes Brother    Colon cancer Neg Hx    Stomach cancer Neg Hx    Esophageal cancer Neg Hx     SOCIAL HISTORY: Social History   Tobacco Use   Smoking status: Some Days    Types: Cigarettes   Smokeless tobacco: Never   Tobacco comments:    Cigar 2-3 times a year   Substance Use Topics   Alcohol use: Not Currently    Comment: occassional wine, no ETOH since 11/2020    Allergies  Allergen Reactions   Compazine [Prochlorperazine Edisylate] Anxiety   Flexeril [Cyclobenzaprine] Anxiety   Meclizine Anxiety    Current Outpatient Medications  Medication Sig Dispense Refill   acetaminophen (TYLENOL) 500 MG tablet Take 1,000 mg by mouth every 6 (six) hours as needed for moderate pain or headache.     amLODipine (NORVASC) 10 MG tablet Take 10 mg by mouth daily.     aspirin-acetaminophen-caffeine (EXCEDRIN MIGRAINE) 250-250-65 MG tablet Take 2 tablets by mouth every 6 (six) hours as needed for headache.     calcitRIOL (ROCALTROL) 0.5 MCG capsule Take 0.5 mcg by mouth daily.     calcium carbonate (TUMS - DOSED IN MG ELEMENTAL CALCIUM) 500 MG chewable tablet Chew 2 tablets by mouth daily as needed for indigestion or heartburn.     CALCIUM PO Take 500 mg by mouth daily.     ferrous sulfate 325 (65 FE) MG tablet Take 325 mg by mouth daily with breakfast.     furosemide (LASIX) 40 MG tablet Take 40 mg by mouth daily.     hydrALAZINE (APRESOLINE) 100 MG tablet Take 100 mg by mouth 2 (two) times daily.     hydrOXYzine (ATARAX/VISTARIL) 25 MG tablet Take 1 tablet (25 mg total) by mouth every 8 (eight) hours as needed for itching. 20 tablet 0   loperamide (IMODIUM) 2 MG capsule Take 4 mg by mouth as needed for diarrhea or loose stools.     Magnesium Oxide 250 MG TABS Take 250 mg by  mouth daily at 12 noon.     omeprazole (PRILOSEC) 20 MG capsule Take 20 mg by mouth daily as needed (reflux).     oxyCODONE-acetaminophen (PERCOCET) 5-325 MG tablet Take 1 tablet by mouth every 6 (six) hours as needed for severe pain. 20 tablet 0   sevelamer carbonate (RENVELA) 800 MG tablet Take 800 mg by mouth 3 (three) times daily with meals.     sodium chloride (OCEAN) 0.65 % SOLN nasal spray Place 1 spray into both nostrils as needed for congestion.     spironolactone (ALDACTONE) 25 MG tablet Take 25 mg by mouth daily.  No current facility-administered medications for this visit.    REVIEW OF SYSTEMS:  [X]  denotes positive finding, [ ]  denotes negative finding Cardiac  Comments:  Chest pain or chest pressure:    Shortness of breath upon exertion:    Short of breath when lying flat:    Irregular heart rhythm:        Vascular    Pain in calf, thigh, or hip brought on by ambulation:    Pain in feet at night that wakes you up from your sleep:     Blood clot in your veins:    Leg swelling:         Pulmonary    Oxygen at home:    Productive cough:     Wheezing:         Neurologic    Sudden weakness in arms or legs:     Sudden numbness in arms or legs:     Sudden onset of difficulty speaking or slurred speech:    Temporary loss of vision in one eye:     Problems with dizziness:         Gastrointestinal    Blood in stool:     Vomited blood:         Genitourinary    Burning when urinating:     Blood in urine:        Psychiatric    Major depression:         Hematologic    Bleeding problems:    Problems with blood clotting too easily:        Skin    Rashes or ulcers:        Constitutional    Fever or chills:      PHYSICAL EXAM: Vitals:   01/30/21 0901  BP: (!) 185/104  Pulse: 89  Resp: 14  Temp: 97.9 F (36.6 C)  TempSrc: Temporal  SpO2: 98%  Weight: 145 lb (65.8 kg)  Height: 5' (1.524 m)    GENERAL: The patient is a well-nourished female, in no  acute distress. The vital signs are documented above. CARDIAC: There is a regular rate and rhythm.  VASCULAR:  Right basilic vein fistula after transposition has no thrill and has a palpable cord consistent with thrombus Right radial pulse palpable with good grip strength in the hand  DATA:   None  Assessment/Plan:  66 year old female status post right second stage basilic vein transposition on 01/12/2021.  She presents for triage visit for right hand numbness.  I discussed that I think this is related to manipulation of the nerve during transposition of the basilic vein.  She has good grip strength in the hand with a palpable radial pulse and no signs of steal.  Unfortunately the fistula appears thrombosed.  There is no appreciable thrill in the fistula.  Unclear when this thrombosed over the last 3 weeks.  The vein was small after the first stage and I think it is probably best to consider alternative access.  I recommended a left basilic vein fistula which she is amendable to.  We will get her scheduled today.  Risk benefits again discussed.   Marty Heck, MD [facility] [place] Office: [phone]

## 2021-02-09 ENCOUNTER — Other Ambulatory Visit: Payer: Self-pay

## 2021-02-09 ENCOUNTER — Encounter (HOSPITAL_COMMUNITY): Payer: Self-pay | Admitting: Vascular Surgery

## 2021-02-09 NOTE — Progress Notes (Signed)
PCP - [name] Cardiologist - denies  PPM/ICD - denies   Chest x-ray - 09/01/20 EKG - 09/02/20 Stress Test - denies ECHO - denies Cardiac Cath - denies  CPAP - denies  No diabetes   ERAS Protcol - no  COVID TEST- ambulatory surgery, not needed  Anesthesia review: no  Patient verbally denies any shortness of breath, fever, cough and chest pain during phone call   -------------  SDW INSTRUCTIONS given:  Your procedure is scheduled on 02/12/21.  Report to [facility] Main Entrance "A" at 5:30 A.M., and check in at the Admitting office.  Call this number if you have problems the morning of surgery:  [phone]   Remember:  Do not eat or drink after midnight the night before your surgery      Take these medicines the morning of surgery with A SIP OF WATER  acetaminophen (TYLENOL) if needed amLODipine (NORVASC) calcitRIOL (ROCALTROL)  hydrALAZINE (APRESOLINE)  hydrOXYzine (ATARAX/VISTARIL) if needed loperamide (IMODIUM) if needed omeprazole (PRILOSEC) if needed oxyCODONE-acetaminophen (PERCOCET) [place] nasal spray  As of today, STOP taking any Aspirin (unless otherwise instructed by your surgeon) Aleve, Naproxen, Ibuprofen, Motrin, Advil, Goody's, BC's, all herbal medications, fish oil, and all vitamins.                      Do not wear jewelry, make up, or nail polish            Do not wear lotions, powders, perfumes/colognes, or deodorant.            Do not shave 48 hours prior to surgery.  Men may shave face and neck.            Do not bring valuables to the hospital.            [facility] is not responsible for any belongings or valuables.  Do NOT Smoke (Tobacco/Vaping) or drink Alcohol 24 hours prior to your procedure If you use a CPAP at night, you may bring all equipment for your overnight stay.   Contacts, glasses, dentures or bridgework may not be worn into surgery.      For patients admitted to the hospital, discharge time will be determined by  your treatment team.   Patients discharged the day of surgery will not be allowed to drive home, and someone needs to stay with them for 24 hours.    Special instructions:   - Preparing For Surgery  Before surgery, you can play an important role. Because skin is not sterile, your skin needs to be as free of germs as possible. You can reduce the number of germs on your skin by washing with CHG (chlorahexidine gluconate) Soap before surgery.  CHG is an antiseptic cleaner which kills germs and bonds with the skin to continue killing germs even after washing.    Oral Hygiene is also important to reduce your risk of infection.  Remember - BRUSH YOUR TEETH THE MORNING OF SURGERY WITH YOUR REGULAR TOOTHPASTE  Please do not use if you have an allergy to CHG or antibacterial soaps. If your skin becomes reddened/irritated stop using the CHG.  Do not shave (including legs and underarms) for at least 48 hours prior to first CHG shower. It is OK to shave your face.  Please follow these instructions carefully.   Shower the NIGHT BEFORE SURGERY and the MORNING OF SURGERY with DIAL Soap.   Pat yourself dry with a CLEAN TOWEL.  [place]  to bed the night before surgery  Place CLEAN SHEETS on your bed the night of your first shower and DO NOT SLEEP WITH PETS.   Day of Surgery: Please shower morning of surgery  Wear Clean/Comfortable clothing the morning of surgery Do not apply any deodorants/lotions.   Remember to brush your teeth WITH YOUR REGULAR TOOTHPASTE.   Questions were answered. Patient verbalized understanding of instructions.

## 2021-02-12 ENCOUNTER — Other Ambulatory Visit: Payer: Self-pay

## 2021-02-12 ENCOUNTER — Encounter (HOSPITAL_COMMUNITY)
Admission: RE | Disposition: A | Discharge status: Home / Self Care | Payer: Self-pay | Source: Home / Self Care | Attending: Vascular Surgery

## 2021-02-12 ENCOUNTER — Ambulatory Visit (HOSPITAL_COMMUNITY): Payer: Medicare Other | Admitting: Certified Registered"

## 2021-02-12 ENCOUNTER — Encounter (HOSPITAL_COMMUNITY): Payer: Self-pay | Admitting: Vascular Surgery

## 2021-02-12 ENCOUNTER — Ambulatory Visit (HOSPITAL_COMMUNITY)
Admission: RE | Admit: 2021-02-12 | Discharge: 2021-02-12 | Disposition: A | Discharge status: Home / Self Care | Payer: Medicare Other | Attending: Vascular Surgery | Admitting: Vascular Surgery

## 2021-02-12 DIAGNOSIS — R200 Anesthesia of skin: Secondary | ICD-10-CM | POA: Diagnosis not present

## 2021-02-12 DIAGNOSIS — Z833 Family history of diabetes mellitus: Secondary | ICD-10-CM

## 2021-02-12 DIAGNOSIS — I120 Hypertensive chronic kidney disease with stage 5 chronic kidney disease or end stage renal disease: Secondary | ICD-10-CM | POA: Diagnosis present

## 2021-02-12 DIAGNOSIS — Z9889 Other specified postprocedural states: Secondary | ICD-10-CM | POA: Diagnosis not present

## 2021-02-12 DIAGNOSIS — Z992 Dependence on renal dialysis: Secondary | ICD-10-CM | POA: Diagnosis not present

## 2021-02-12 DIAGNOSIS — F17210 Nicotine dependence, cigarettes, uncomplicated: Secondary | ICD-10-CM | POA: Insufficient documentation

## 2021-02-12 DIAGNOSIS — Z800 Family history of malignant neoplasm of digestive organs: Secondary | ICD-10-CM

## 2021-02-12 DIAGNOSIS — N186 End stage renal disease: Secondary | ICD-10-CM | POA: Diagnosis not present

## 2021-02-12 DIAGNOSIS — T82868A Thrombosis of vascular prosthetic devices, implants and grafts, initial encounter: Secondary | ICD-10-CM | POA: Diagnosis not present

## 2021-02-12 HISTORY — PX: AV FISTULA PLACEMENT: SHX1204

## 2021-02-12 LAB — POCT I-STAT, CHEM 8
BUN: 33 mg/dL — ABNORMAL HIGH (ref 8–23)
Calcium, Ion: 1.01 mmol/L — ABNORMAL LOW (ref 1.15–1.40)
Chloride: 92 mmol/L — ABNORMAL LOW (ref 98–111)
Creatinine, Ser: 5.3 mg/dL — ABNORMAL HIGH (ref 0.44–1.00)
Glucose, Bld: 103 mg/dL — ABNORMAL HIGH (ref 70–99)
HCT: 26 % — ABNORMAL LOW (ref 36.0–46.0)
Hemoglobin: 8.8 g/dL — ABNORMAL LOW (ref 12.0–15.0)
Potassium: 4.7 mmol/L (ref 3.5–5.1)
Sodium: 125 mmol/L — ABNORMAL LOW (ref 135–145)
TCO2: 23 mmol/L (ref 22–32)

## 2021-02-12 SURGERY — ARTERIOVENOUS (AV) FISTULA CREATION
Anesthesia: Monitor Anesthesia Care | Site: Arm Upper | Laterality: Left

## 2021-02-12 MED ORDER — SODIUM CHLORIDE 0.9 % IV SOLN: INTRAVENOUS | Status: DC | PRN

## 2021-02-12 MED ORDER — 0.9 % SODIUM CHLORIDE (POUR BTL) OPTIME
TOPICAL | Status: DC | PRN
  Administered 2021-02-12: 1000 mL

## 2021-02-12 MED ORDER — CEFAZOLIN SODIUM-DEXTROSE 2-4 GM/100ML-% IV SOLN
2.0000 g | INTRAVENOUS | Status: AC
  Administered 2021-02-12: 2 g via INTRAVENOUS
  Filled 2021-02-12: qty 100

## 2021-02-12 MED ORDER — ONDANSETRON HCL 4 MG/2ML IJ SOLN: 4.0000 mg | Freq: Once | INTRAMUSCULAR | Status: DC | PRN

## 2021-02-12 MED ORDER — CHLORHEXIDINE GLUCONATE 4 % EX LIQD: 60.0000 mL | Freq: Once | CUTANEOUS | Status: DC

## 2021-02-12 MED ORDER — PHENYLEPHRINE HCL-NACL 20-0.9 MG/250ML-% IV SOLN
INTRAVENOUS | Status: DC | PRN
  Administered 2021-02-12: 50 ug/min via INTRAVENOUS

## 2021-02-12 MED ORDER — HEPARIN 6000 UNIT IRRIGATION SOLUTION
Status: DC | PRN
  Administered 2021-02-12: 1

## 2021-02-12 MED ORDER — PROMETHAZINE HCL 25 MG/ML IJ SOLN: 6.2500 mg | INTRAMUSCULAR | Status: DC | PRN

## 2021-02-12 MED ORDER — LIDOCAINE HCL 1 % IJ SOLN
INTRAMUSCULAR | Status: AC
  Filled 2021-02-12: qty 20

## 2021-02-12 MED ORDER — FENTANYL CITRATE (PF) 250 MCG/5ML IJ SOLN
INTRAMUSCULAR | Status: AC
  Filled 2021-02-12: qty 5

## 2021-02-12 MED ORDER — ROPIVACAINE HCL 5 MG/ML IJ SOLN
INTRAMUSCULAR | Status: DC | PRN
  Administered 2021-02-12: 20 mL via PERINEURAL

## 2021-02-12 MED ORDER — ORAL CARE MOUTH RINSE: 15.0000 mL | Freq: Once | OROMUCOSAL | Status: AC

## 2021-02-12 MED ORDER — LIDOCAINE 2% (20 MG/ML) 5 ML SYRINGE
INTRAMUSCULAR | Status: DC | PRN
  Administered 2021-02-12: 30 mg via INTRAVENOUS

## 2021-02-12 MED ORDER — SODIUM CHLORIDE 0.9 % IV SOLN: INTRAVENOUS | Status: DC

## 2021-02-12 MED ORDER — ONDANSETRON HCL 4 MG/2ML IJ SOLN
INTRAMUSCULAR | Status: DC | PRN
  Administered 2021-02-12: 4 mg via INTRAVENOUS

## 2021-02-12 MED ORDER — LIDOCAINE-EPINEPHRINE 2 %-1:100000 IJ SOLN
INTRAMUSCULAR | Status: DC | PRN
  Administered 2021-02-12: 10 mL via PERINEURAL

## 2021-02-12 MED ORDER — OXYCODONE-ACETAMINOPHEN 5-325 MG PO TABS: 1.0000 | ORAL_TABLET | Freq: Four times a day (QID) | ORAL | 0 refills | Status: DC | PRN

## 2021-02-12 MED ORDER — HEPARIN SODIUM (PORCINE) 1000 UNIT/ML IJ SOLN
INTRAMUSCULAR | Status: DC | PRN
  Administered 2021-02-12: 3000 [IU] via INTRAVENOUS

## 2021-02-12 MED ORDER — HEPARIN 6000 UNIT IRRIGATION SOLUTION
Status: AC
  Filled 2021-02-12: qty 500

## 2021-02-12 MED ORDER — OXYCODONE HCL 5 MG PO TABS: 5.0000 mg | ORAL_TABLET | Freq: Once | ORAL | Status: DC | PRN

## 2021-02-12 MED ORDER — CHLORHEXIDINE GLUCONATE 0.12 % MT SOLN
15.0000 mL | Freq: Once | OROMUCOSAL | Status: AC
  Administered 2021-02-12: 15 mL via OROMUCOSAL
  Filled 2021-02-12: qty 15

## 2021-02-12 MED ORDER — FENTANYL CITRATE (PF) 100 MCG/2ML IJ SOLN
INTRAMUSCULAR | Status: DC | PRN
  Administered 2021-02-12: 50 ug via INTRAVENOUS
  Administered 2021-02-12: 100 ug via INTRAVENOUS
  Administered 2021-02-12: 50 ug via INTRAVENOUS
  Administered 2021-02-12 (×2): 25 ug via INTRAVENOUS

## 2021-02-12 MED ORDER — OXYCODONE HCL 5 MG/5ML PO SOLN: 5.0000 mg | Freq: Once | ORAL | Status: DC | PRN

## 2021-02-12 MED ORDER — PROPOFOL 10 MG/ML IV BOLUS
INTRAVENOUS | Status: DC | PRN
  Administered 2021-02-12: 15 mg via INTRAVENOUS
  Administered 2021-02-12: 20 mg via INTRAVENOUS

## 2021-02-12 MED ORDER — PROPOFOL 500 MG/50ML IV EMUL
INTRAVENOUS | Status: DC | PRN
  Administered 2021-02-12: 80 ug/kg/min via INTRAVENOUS

## 2021-02-12 MED ORDER — ONDANSETRON HCL 4 MG/2ML IJ SOLN
INTRAMUSCULAR | Status: AC
  Filled 2021-02-12: qty 2

## 2021-02-12 MED ORDER — FENTANYL CITRATE (PF) 100 MCG/2ML IJ SOLN: 25.0000 ug | INTRAMUSCULAR | Status: DC | PRN

## 2021-02-12 MED ORDER — LIDOCAINE 2% (20 MG/ML) 5 ML SYRINGE
INTRAMUSCULAR | Status: AC
  Filled 2021-02-12: qty 5

## 2021-02-12 MED ORDER — PROPOFOL 1000 MG/100ML IV EMUL
INTRAVENOUS | Status: AC
  Filled 2021-02-12: qty 100

## 2021-02-12 SURGICAL SUPPLY — 36 items
ARMBAND PINK RESTRICT EXTREMIT (MISCELLANEOUS) ×2 IMPLANT
BAG COUNTER SPONGE SURGICOUNT (BAG) IMPLANT
BLADE CLIPPER SURG (BLADE) IMPLANT
CANISTER SUCT 3000ML PPV (MISCELLANEOUS) ×2 IMPLANT
CLIP VESOCCLUDE MED 6/CT (CLIP) ×2 IMPLANT
CLIP VESOCCLUDE SM WIDE 6/CT (CLIP) ×2 IMPLANT
COVER PROBE W GEL 5X96 (DRAPES) ×2 IMPLANT
DECANTER SPIKE VIAL GLASS SM (MISCELLANEOUS) ×2 IMPLANT
DERMABOND ADVANCED (GAUZE/BANDAGES/DRESSINGS) ×1
DERMABOND ADVANCED .7 DNX12 (GAUZE/BANDAGES/DRESSINGS) ×1 IMPLANT
ELECT REM PT RETURN 9FT ADLT (ELECTROSURGICAL) ×2
ELECTRODE REM PT RTRN 9FT ADLT (ELECTROSURGICAL) ×1 IMPLANT
GAUZE 4X4 16PLY ~~LOC~~+RFID DBL (SPONGE) ×2 IMPLANT
GLOVE SRG 8 PF TXTR STRL LF DI (GLOVE) ×1 IMPLANT
GLOVE SURG ENC MOIS LTX SZ7.5 (GLOVE) ×2 IMPLANT
GLOVE SURG UNDER POLY LF SZ7 (GLOVE) ×2 IMPLANT
GLOVE SURG UNDER POLY LF SZ8 (GLOVE) ×1
GOWN STRL REUS W/ TWL LRG LVL3 (GOWN DISPOSABLE) ×1 IMPLANT
GOWN STRL REUS W/ TWL XL LVL3 (GOWN DISPOSABLE) ×2 IMPLANT
GOWN STRL REUS W/TWL LRG LVL3 (GOWN DISPOSABLE) ×1
GOWN STRL REUS W/TWL XL LVL3 (GOWN DISPOSABLE) ×2
HEMOSTAT SPONGE AVITENE ULTRA (HEMOSTASIS) IMPLANT
KIT BASIN OR (CUSTOM PROCEDURE TRAY) ×2 IMPLANT
KIT TURNOVER KIT B (KITS) ×2 IMPLANT
NS IRRIG 1000ML POUR BTL (IV SOLUTION) ×2 IMPLANT
PACK CV ACCESS (CUSTOM PROCEDURE TRAY) ×2 IMPLANT
PAD ARMBOARD 7.5X6 YLW CONV (MISCELLANEOUS) ×4 IMPLANT
SPONGE T-LAP 18X18 ~~LOC~~+RFID (SPONGE) ×2 IMPLANT
SUT MNCRL AB 4-0 PS2 18 (SUTURE) ×2 IMPLANT
SUT PROLENE 6 0 BV (SUTURE) ×2 IMPLANT
SUT PROLENE 7 0 BV 1 (SUTURE) IMPLANT
SUT VIC AB 3-0 SH 27 (SUTURE) ×1
SUT VIC AB 3-0 SH 27X BRD (SUTURE) ×1 IMPLANT
TOWEL GREEN STERILE (TOWEL DISPOSABLE) ×2 IMPLANT
UNDERPAD 30X36 HEAVY ABSORB (UNDERPADS AND DIAPERS) ×2 IMPLANT
WATER STERILE IRR 1000ML POUR (IV SOLUTION) ×2 IMPLANT

## 2021-02-12 NOTE — H&P (Signed)
History and Physical Interval Note:  02/12/2021 7:32 AM  Alicia [name]  has presented today for surgery, with the diagnosis of End Stage Renal Disese.  The various methods of treatment have been discussed with the patient and family. After consideration of risks, benefits and other options for treatment, the patient has consented to  Procedure(s): LEFT ARM ARTERIOVENOUS (AV) FISTULA CREATION (Left) as a surgical intervention.  The patient's history has been reviewed, patient examined, no change in status, stable for surgery.  I have reviewed the patient's chart and labs.  Questions were answered to the patient's satisfaction.    Left arm AVF vs graft.  Likely basilic vein based on vein mapping.  Marty Heck  Patient name: Alicia [name]         MRN: [id number]        DOB: 04-28-1955        Sex: female   REASON FOR VISIT: Evaluate right hand numbness after second stage basilic vein transposition   HPI: Alicia [name] is a 65 y.o. female with end-stage renal disease that presents for evaluation of right hand numbness after second stage basilic vein transposition.  She most recently underwent right second stage basilic vein transposition on 01/12/2021.  States she had some right hand numbness immediately after surgery that got better the next day.  She has had ongoing numbness in the right forearm.  Hand has good strength.       Past Medical History:  Diagnosis Date   Anxiety     Ascites     Breast cancer ([place])      mets   Cancer ([place])      stomach   Chronic anticoagulation      reported on for DVT prophylaxis, possibly in setting of cancer, but Eliquis d/c'd 08/2016 in setting of anemia/liver disease/acites   Cirrhosis ([place])     CKD (chronic kidney disease), stage III ([place])      Dailysis T-TH-S   Dyspnea      with fluid buildup, no current problems 01/10/21   Esophageal varices ([place]) 11/2016    Small noted on ENDO   GERD (gastroesophageal reflux disease)     Headache       otc med prn   History of blood transfusion 08/2020   Hypertension     Hypotension     Pneumonia     Portal hypertension ([place])             Past Surgical History:  Procedure Laterality Date   AV FISTULA PLACEMENT Right 09/01/2020    Procedure: RIGHT ARM ARTERIOVENOUS 1st STAGE BASILIC;  Surgeon: Marty Heck, MD;  Location: [place];  Service: Vascular;  Laterality: Right;   [place] Right 01/12/2021    Procedure: RIGHT ARM SECOND STAGE BASILIC VEIN TRANSPOSITION;  Surgeon: Marty Heck, MD;  Location: [place];  Service: Vascular;  Laterality: Right;   BREAST LUMPECTOMY       BREAST SURGERY       CESAREAN SECTION       COLONOSCOPY WITH PROPOFOL N/A 05/25/2019    Procedure: COLONOSCOPY WITH PROPOFOL;  Surgeon: Wilford Corner, MD;  Location: [facility];  Service: Endoscopy;  Laterality: N/A;   ESOPHAGOGASTRODUODENOSCOPY (EGD) WITH PROPOFOL N/A 11/21/2016    Procedure: ESOPHAGOGASTRODUODENOSCOPY;  Surgeon: Milus Banister, MD;  Location: [facility];  Service: Endoscopy;  Laterality: N/A;   ESOPHAGOGASTRODUODENOSCOPY (EGD) WITH PROPOFOL N/A 05/25/2019    Procedure: ESOPHAGOGASTRODUODENOSCOPY (EGD) WITH PROPOFOL;  Surgeon: Wilford Corner, MD;  Location: [facility];  Service: Endoscopy;  Laterality: N/A;   INSERTION OF DIALYSIS CATHETER Left 09/01/2020    Procedure: INSERTION OF A LEFT INTERNAL JUGULAR TUNNELLED DIALYSIS CATHETER. ULTRASOUND GUIDED;  Surgeon: Marty Heck, MD;  Location: [employer];  Service: Vascular;  Laterality: Left;   IR PARACENTESIS   09/04/2016   IR PARACENTESIS   09/20/2016   IR PARACENTESIS   10/22/2016   IR PARACENTESIS   01/27/2017   IR PARACENTESIS   04/11/2017   IR PARACENTESIS   06/05/2017   IR PARACENTESIS   06/05/2017   IR PARACENTESIS   06/19/2017   IR PARACENTESIS   07/03/2017   IR PARACENTESIS   07/17/2017   IR PARACENTESIS   08/01/2017   IR PARACENTESIS   08/14/2017   IR PARACENTESIS   08/28/2017   IR PARACENTESIS    09/11/2017   IR PARACENTESIS   09/25/2017   IR PARACENTESIS   10/08/2017   IR PARACENTESIS   10/23/2017   IR PARACENTESIS   11/06/2017   IR PARACENTESIS   11/20/2017   IR PARACENTESIS   12/04/2017   IR PARACENTESIS   12/18/2017   IR PARACENTESIS   01/19/2018   IR PARACENTESIS   02/09/2018   IR PARACENTESIS   02/24/2018   IR PARACENTESIS   03/16/2018   IR PARACENTESIS   04/10/2018   IR PARACENTESIS   05/08/2018   IR PARACENTESIS   06/11/2018   IR PARACENTESIS   07/07/2018   IR PARACENTESIS   09/04/2018   IR PARACENTESIS   10/22/2018   IR PARACENTESIS   11/30/2018   IR PARACENTESIS   01/14/2019   IR PARACENTESIS   04/30/2019   IR PARACENTESIS   07/19/2019   IR PARACENTESIS   09/05/2020   IR RADIOLOGIST EVAL & MGMT   12/23/2017   LYMPH NODE DISSECTION       ULTRASOUND GUIDANCE FOR VASCULAR ACCESS Left 09/01/2020    Procedure: ULTRASOUND GUIDANCE FOR VASCULAR ACCESS;  Surgeon: Marty Heck, MD;  Location: [facility];  Service: Vascular;  Laterality: Left;           Family History  Problem Relation Age of Onset   Diabetes Mother     Diabetes Father     Diabetes Brother     Colon cancer Neg Hx     Stomach cancer Neg Hx     Esophageal cancer Neg Hx        SOCIAL HISTORY: Social History         Tobacco Use   Smoking status: Some Days      Types: Cigarettes   Smokeless tobacco: Never   Tobacco comments:      Cigar 2-3 times a year   Substance Use Topics   Alcohol use: Not Currently      Comment: occassional wine, no ETOH since 11/2020          Allergies  Allergen Reactions   Compazine [Prochlorperazine Edisylate] Anxiety   Flexeril [Cyclobenzaprine] Anxiety   Meclizine Anxiety            Current Outpatient Medications  Medication Sig Dispense Refill   acetaminophen (TYLENOL) 500 MG tablet Take 1,000 mg by mouth every 6 (six) hours as needed for moderate pain or headache.       amLODipine (NORVASC) 10 MG tablet Take 10 mg by mouth daily.       aspirin-acetaminophen-caffeine (EXCEDRIN  MIGRAINE) 250-250-65 MG tablet Take 2 tablets by mouth every 6 (six) hours as needed for  headache.       calcitRIOL (ROCALTROL) 0.5 MCG capsule Take 0.5 mcg by mouth daily.       calcium carbonate (TUMS - DOSED IN MG ELEMENTAL CALCIUM) 500 MG chewable tablet Chew 2 tablets by mouth daily as needed for indigestion or heartburn.       CALCIUM PO Take 500 mg by mouth daily.       ferrous sulfate 325 (65 FE) MG tablet Take 325 mg by mouth daily with breakfast.       furosemide (LASIX) 40 MG tablet Take 40 mg by mouth daily.       hydrALAZINE (APRESOLINE) 100 MG tablet Take 100 mg by mouth 2 (two) times daily.       hydrOXYzine (ATARAX/VISTARIL) 25 MG tablet Take 1 tablet (25 mg total) by mouth every 8 (eight) hours as needed for itching. 20 tablet 0   loperamide (IMODIUM) 2 MG capsule Take 4 mg by mouth as needed for diarrhea or loose stools.       Magnesium Oxide 250 MG TABS Take 250 mg by mouth daily at 12 noon.       omeprazole (PRILOSEC) 20 MG capsule Take 20 mg by mouth daily as needed (reflux).       oxyCODONE-acetaminophen (PERCOCET) 5-325 MG tablet Take 1 tablet by mouth every 6 (six) hours as needed for severe pain. 20 tablet 0   sevelamer carbonate (RENVELA) 800 MG tablet Take 800 mg by mouth 3 (three) times daily with meals.       sodium chloride (OCEAN) 0.65 % SOLN nasal spray Place 1 spray into both nostrils as needed for congestion.       spironolactone (ALDACTONE) 25 MG tablet Take 25 mg by mouth daily.        No current facility-administered medications for this visit.      REVIEW OF SYSTEMS:  [X]  denotes positive finding, [ ]  denotes negative finding Cardiac   Comments:  Chest pain or chest pressure:      Shortness of breath upon exertion:      Short of breath when lying flat:      Irregular heart rhythm:             Vascular      Pain in calf, thigh, or hip brought on by ambulation:      Pain in feet at night that wakes you up from your sleep:       Blood clot in your  veins:      Leg swelling:              Pulmonary      Oxygen at home:      Productive cough:       Wheezing:              Neurologic      Sudden weakness in arms or legs:       Sudden numbness in arms or legs:       Sudden onset of difficulty speaking or slurred speech:      Temporary loss of vision in one eye:       Problems with dizziness:              Gastrointestinal      Blood in stool:       Vomited blood:              Genitourinary      Burning when urinating:       Blood  in urine:             Psychiatric      Major depression:              Hematologic      Bleeding problems:      Problems with blood clotting too easily:             Skin      Rashes or ulcers:             Constitutional      Fever or chills:          PHYSICAL EXAM:    Vitals:    01/30/21 0901  BP: (!) 185/104  Pulse: 89  Resp: 14  Temp: 97.9 F (36.6 C)  TempSrc: Temporal  SpO2: 98%  Weight: 145 lb (65.8 kg)  Height: 5' (1.524 m)      GENERAL: The patient is a well-nourished female, in no acute distress. The vital signs are documented above. CARDIAC: There is a regular rate and rhythm.  VASCULAR:  Right basilic vein fistula after transposition has no thrill and has a palpable cord consistent with thrombus Right radial pulse palpable with good grip strength in the hand   DATA:    None   Assessment/Plan:   65 year old female status post right second stage basilic vein transposition on 01/12/2021.  She presents for triage visit for right hand numbness.  I discussed that I think this is related to manipulation of the nerve during transposition of the basilic vein.  She has good grip strength in the hand with a palpable radial pulse and no signs of steal.  Unfortunately the fistula appears thrombosed.  There is no appreciable thrill in the fistula.  Unclear when this thrombosed over the last 3 weeks.  The vein was small after the first stage and I think it is probably best to consider  alternative access.  I recommended a left basilic vein fistula which she is amendable to.  We will get her scheduled today.  Risk benefits again discussed.     Marty Heck, MD [facility] Office: [phone]

## 2021-02-12 NOTE — Op Note (Signed)
OPERATIVE NOTE   PROCEDURE: left first stage basilic vein transposition (brachiobasilic arteriovenous fistula) placement  PRE-OPERATIVE DIAGNOSIS: ESRD  POST-OPERATIVE DIAGNOSIS: same  SURGEON: Marty Heck, MD  ASSISTANT(S): Roxy Horseman, PA  ANESTHESIA: regional  ESTIMATED BLOOD LOSS: Minimal  FINDING(S): 1.  Basilic vein: 3 mm, acceptable 2.  Brachial artery: 4.5 mm, disease free 3.  Venous outflow: palpable thrill  4.  Radial flow: palpable radial pulse  SPECIMEN(S):  none  INDICATIONS:   Alicia [name] is a 65 y.o. female who presents with ESRD and need for permanent hemodialysis access.  She has a failed basilic vein fistula in the right arm.  The patient is scheduled for left first stage basilic vein transposition.  The patient is aware the risks include but are not limited to: bleeding, infection, steal syndrome, nerve damage, ischemic monomelic neuropathy, failure to mature, and need for additional procedures.  The patient is aware of the risks of the procedure and elects to proceed forward.  An assistant was needed for exposure and to expedite the case.   DESCRIPTION: After full informed written consent was obtained from the patient, the patient was brought back to the operating room and placed supine upon the operating table.  Prior to induction, the patient received IV antibiotics.   After obtaining adequate anesthesia, the patient was then prepped and draped in the standard fashion for a left arm access procedure.  I turned my attention first to identifying the patient's basilic vein and brachial artery.    Using SonoSite guidance, the location of these vessels were marked out on the skin near the antecubital fossa.   I made a transverse incision at the level of the antecubitum and dissected through the subcutaneous tissue and fascia to gain exposure of the brachial artery.  This was noted to be 4.5 mm in diameter externally.  This was dissected  out proximally and distally and controlled with vessel loops .  I then dissected out the basilic vein.  This was noted to be 3 mm in diameter externally.  The distal segment of the vein was ligated with a  2-0 silk, and the vein was transected.  The proximal segment was interrogated with serial dilators.  The vein accepted up to a 4.5 mm dilator without any difficulty.  I then instilled the heparinized saline into the vein and clamped it.  At this point, I reset my exposure of the brachial artery.  The patient was given 3,000 units IV heparin.  I then placed the artery under tension proximally and distally.  I made an arteriotomy with a #11 blade, and then I extended the arteriotomy with a Potts scissor.  I injected heparinized saline proximal and distal to this arteriotomy.  The vein was then sewn to the artery in an end-to-side configuration with a running stitch of 6-0 Prolene.  Prior to completing this anastomosis, I allowed the vein and artery to backbleed.  There was no evidence of clot from any vessels.  I completed the anastomosis in the usual fashion and then released all vessel loops and clamps.    There was a palpable thrill in the venous outflow, and there was a palpable radial pulse.  At this point, I irrigated out the surgical wound.  There was no further active bleeding.  The subcutaneous tissue was reapproximated with a running stitch of 3-0 Vicryl.  The skin was then reapproximated with a running subcuticular stitch of 4-0 Monocryl.  The skin was then cleaned, dried,  and reinforced with Dermabond.  The patient tolerated this procedure well.   COMPLICATIONS: None  CONDITION: Stable  Marty Heck MD [facility] Office: [place]   02/12/2021, 8:48 AM

## 2021-02-12 NOTE — Anesthesia Procedure Notes (Signed)
Anesthesia Procedure Image    

## 2021-02-12 NOTE — Progress Notes (Signed)
Orthopedic Tech Progress Note Patient Details:  Alicia [name] [birth year] [id number]  PACU RN called requesting an ARM SLING for patient.    Ortho Devices Type of Ortho Device: Arm sling Ortho Device/Splint Location: LUE Ortho Device/Splint Interventions: Ordered, Other (comment)   Post Interventions Patient Tolerated: Well Instructions Provided: [facility] [place] 02/12/2021, 9:51 AM

## 2021-02-12 NOTE — Anesthesia Postprocedure Evaluation (Signed)
Anesthesia Post Note  Patient: Alicia [name]  Procedure(s) Performed: LEFT ARM FIRST STAGE BASILIC ARTERIOVENOUS (AV) FISTULA CREATION (Left: Arm Upper)     Patient location during evaluation: PACU Anesthesia Type: Regional and MAC Level of consciousness: awake and alert Pain management: pain level controlled Vital Signs Assessment: post-procedure vital signs reviewed and stable Respiratory status: spontaneous breathing, nonlabored ventilation, respiratory function stable and patient connected to nasal cannula oxygen Cardiovascular status: stable and blood pressure returned to baseline Postop Assessment: no apparent nausea or vomiting Anesthetic complications: no   No notable events documented.  Last Vitals:  Vitals:   02/12/21 0905 02/12/21 0920  BP: (!) 144/70 (!) 155/71  Pulse: 91 83  Resp: 15 20  Temp:  36.9 C  SpO2: 98% 96%    Last Pain:  Vitals:   02/12/21 0920  TempSrc:   PainSc: 0-No pain                 Alicia [name] S

## 2021-02-12 NOTE — Anesthesia Procedure Notes (Signed)
Anesthesia Regional Block: Supraclavicular block   Pre-Anesthetic Checklist: , timeout performed,  Correct Patient, Correct Site, Correct Laterality,  Correct Procedure, Correct Position, site marked,  Risks and benefits discussed,  Surgical consent,  Pre-op evaluation,  At surgeon's request and post-op pain management  Laterality: Left  Prep: chloraprep       Needles:  Injection technique: Single-shot  Needle Type: Echogenic Needle     Needle Length: 9cm      Additional Needles:   Procedures:,,,, ultrasound used (permanent image in chart),,    Narrative:  Start time: 02/12/2021 7:12 AM End time: 02/12/2021 7:20 AM Injection made incrementally with aspirations every 5 mL.  Performed by: Personally  Anesthesiologist: Myrtie Soman, MD  Additional Notes: Patient tolerated the procedure well without complications

## 2021-02-12 NOTE — Transfer of Care (Signed)
Immediate Anesthesia Transfer of Care Note  Patient: Alicia [name]  Procedure(s) Performed: LEFT ARM FIRST STAGE BASILIC ARTERIOVENOUS (AV) FISTULA CREATION (Left: Arm Upper)  Patient Location: PACU  Anesthesia Type:MAC and Regional  Level of Consciousness: drowsy, patient cooperative and responds to stimulation  Airway & Oxygen Therapy: Patient Spontanous Breathing and Patient connected to nasal cannula oxygen  Post-op Assessment: Report given to RN and Post -op Vital signs reviewed and stable  Post vital signs: Reviewed and stable  Last Vitals:  Vitals Value Taken Time  BP 135/67 02/12/21 0849  Temp    Pulse 97 02/12/21 0851  Resp 26 02/12/21 0851  SpO2 98 % 02/12/21 0851  Vitals shown include unvalidated device data.  Last Pain:  Vitals:   02/12/21 0601  TempSrc:   PainSc: 0-No pain      Patients Stated Pain Goal: 0 (73/56/70 1410)  Complications: No notable events documented.

## 2021-02-12 NOTE — Anesthesia Preprocedure Evaluation (Signed)
Anesthesia Evaluation  Patient identified by MRN, date of birth, ID band Patient awake    Reviewed: Allergy & Precautions, NPO status , Patient's Chart, lab work & pertinent test results  Airway Mallampati: II  TM Distance: >3 FB Neck ROM: Full    Dental no notable dental hx.    Pulmonary Current Smoker,    Pulmonary exam normal breath sounds clear to auscultation       Cardiovascular hypertension, Pt. on medications Normal cardiovascular exam Rhythm:Regular Rate:Normal     Neuro/Psych negative neurological ROS  negative psych ROS   GI/Hepatic Neg liver ROS, GERD  ,  Endo/Other  negative endocrine ROS  Renal/GU DialysisRenal disease  negative genitourinary   Musculoskeletal negative musculoskeletal ROS (+)   Abdominal   Peds negative pediatric ROS (+)  Hematology  (+) anemia ,   Anesthesia Other Findings   Reproductive/Obstetrics negative OB ROS                             Anesthesia Physical Anesthesia Plan  ASA: 3  Anesthesia Plan: MAC and Regional   Post-op Pain Management:  Regional for Post-op pain   Induction: Intravenous  PONV Risk Score and Plan: 2 and Propofol infusion  Airway Management Planned: Simple Face Mask  Additional Equipment:   Intra-op Plan:   Post-operative Plan:   Informed Consent: I have reviewed the patients History and Physical, chart, labs and discussed the procedure including the risks, benefits and alternatives for the proposed anesthesia with the patient or authorized representative who has indicated his/her understanding and acceptance.     Dental advisory given  Plan Discussed with: CRNA and Surgeon  Anesthesia Plan Comments:         Anesthesia Quick Evaluation

## 2021-02-13 ENCOUNTER — Encounter (HOSPITAL_COMMUNITY): Payer: Self-pay | Admitting: Vascular Surgery

## 2021-02-15 ENCOUNTER — Other Ambulatory Visit: Payer: Self-pay | Admitting: Physician Assistant

## 2021-02-15 DIAGNOSIS — Z992 Dependence on renal dialysis: Secondary | ICD-10-CM

## 2021-02-15 MED ORDER — OXYCODONE-ACETAMINOPHEN 5-325 MG PO TABS: 1.0000 | ORAL_TABLET | Freq: Four times a day (QID) | ORAL | 0 refills | Status: DC | PRN

## 2021-02-19 ENCOUNTER — Other Ambulatory Visit: Payer: Self-pay | Admitting: Physician Assistant

## 2021-02-19 MED ORDER — OXYCODONE-ACETAMINOPHEN 5-325 MG PO TABS
1.0000 | ORAL_TABLET | Freq: Four times a day (QID) | ORAL | 0 refills | Status: DC | PRN
Start: 2021-02-19 — End: 2021-09-05

## 2021-02-21 ENCOUNTER — Other Ambulatory Visit: Payer: Self-pay

## 2021-02-21 DIAGNOSIS — N186 End stage renal disease: Secondary | ICD-10-CM

## 2021-02-21 DIAGNOSIS — Z992 Dependence on renal dialysis: Secondary | ICD-10-CM

## 2021-03-20 ENCOUNTER — Encounter (HOSPITAL_COMMUNITY): Payer: Medicare Other

## 2021-03-21 ENCOUNTER — Other Ambulatory Visit: Payer: Self-pay

## 2021-03-21 ENCOUNTER — Ambulatory Visit (INDEPENDENT_AMBULATORY_CARE_PROVIDER_SITE_OTHER): Payer: Medicare Other | Admitting: Physician Assistant

## 2021-03-21 ENCOUNTER — Ambulatory Visit (HOSPITAL_COMMUNITY)
Admission: RE | Admit: 2021-03-21 | Discharge: 2021-03-21 | Disposition: A | Discharge status: Home / Self Care | Payer: Medicare Other | Source: Ambulatory Visit | Attending: Physician Assistant | Admitting: Physician Assistant

## 2021-03-21 VITALS — BP 212/93 | HR 94 | Temp 98.0°F | Resp 18 | Ht 60.0 in | Wt 153.8 lb

## 2021-03-21 DIAGNOSIS — Z992 Dependence on renal dialysis: Secondary | ICD-10-CM

## 2021-03-21 DIAGNOSIS — N186 End stage renal disease: Secondary | ICD-10-CM | POA: Diagnosis present

## 2021-03-21 NOTE — Progress Notes (Signed)
Postoperative Access Visit   History of Present Illness   Alicia [name] is a 65 y.o. year old female who presents for postoperative follow-up for: left brachiobasilic AV fistula on 20.3.55 by Dr. Carlis Abbott.  The patient's wounds are well healed.  The patient notes no steal symptoms.  The patient is able to complete their activities of daily living.  The patient's current symptoms are: mild tingling and numbness along medial left forearm. Hx of failed RUE BV fistula.   She is currently dialyzing via left IJ TDC on Tues/Thurs/ Sat at the [facility] street location  Physical Examination   Vitals:   03/21/21 1202  BP: (!) 212/93  Pulse: 94  Resp: 18  Temp: 98 F (36.7 C)  TempSrc: Temporal  SpO2: 99%  Weight: 153 lb 12.8 oz (69.8 kg)  Height: 5' (1.524 m)   Body mass index is 30.04 kg/m.  left arm Incision is well healed, 2+ radial pulse, hand grip is 5/5, sensation in digits is intact, palpable thrill, bruit can be auscultated    Non invasive vascular lab: Findings:  +--------------------+----------+-----------------+--------+   AVF                  PSV (cm/s) Flow Vol (mL/min) Comments   +--------------------+----------+-----------------+--------+   Native artery inflow    254           1121                   +--------------------+----------+-----------------+--------+   AVF Anastomosis         736                                  +--------------------+----------+-----------------+--------+     +------------+----------+-------------+-----------+--------+   OUTFLOW VEIN PSV (cm/s) Diameter (cm) Depth (cm)  Describe   +------------+----------+-------------+-----------+--------+   Prox UA          92         0.61         0.58                +------------+----------+-------------+-----------+--------+   Mid UA          107         0.70         0.52                +------------+----------+-------------+-----------+--------+   Dist UA      603 / 207   0.27 / 0.65  0.22 / 0.21             +------------+----------+-------------+-----------+--------+   AC Fossa        340         0.41         0.23                +------------+----------+-------------+-----------+--------+     Summary:  Patent left Basilic vein transposition with increased velocity at slight area of narrowing in the distal upper arm.   Medical Decision Making   Alicia [name] is a 65 y.o. year old female who presents s/p left brachiobasilic AV fistula on 97.4.16 by Dr. Carlis Abbott.  Her left AC incision has healed nicely. Patent is without signs or symptoms of steal syndrome. Her fistula has matured nicely on duplex evaluation however it is deep in the left upper arm. Discussed with patient need for left BV transposition. She is planning to move to  [place] to be closer to her family in the next 3-6 months once her fistula is healed. She would like to talk to her family about timing of scheduling her surgery. She dialyzes on Tues/ Thurs/ Sat. Will try to arrange her 2nd stage BV transposition with Dr. Carlis Abbott in the near future on a non dialysis day. She will call to schedule once she has talked to her family   Karoline Caldwell, PA-C [facility] Office: [facility] MD: Yetta Barre

## 2021-04-11 ENCOUNTER — Other Ambulatory Visit: Payer: Self-pay

## 2021-05-19 ENCOUNTER — Emergency Department (HOSPITAL_COMMUNITY): Payer: Medicare (Managed Care)

## 2021-05-19 ENCOUNTER — Emergency Department (HOSPITAL_COMMUNITY)
Admission: EM | Admit: 2021-05-19 | Discharge: 2021-05-19 | Disposition: A | Discharge status: Home / Self Care | Payer: Medicare (Managed Care) | Attending: Emergency Medicine | Admitting: Emergency Medicine

## 2021-05-19 ENCOUNTER — Other Ambulatory Visit: Payer: Self-pay

## 2021-05-19 ENCOUNTER — Encounter (HOSPITAL_COMMUNITY): Payer: Self-pay | Admitting: *Deleted

## 2021-05-19 DIAGNOSIS — Z992 Dependence on renal dialysis: Secondary | ICD-10-CM | POA: Insufficient documentation

## 2021-05-19 DIAGNOSIS — R079 Chest pain, unspecified: Secondary | ICD-10-CM | POA: Diagnosis present

## 2021-05-19 DIAGNOSIS — I4891 Unspecified atrial fibrillation: Secondary | ICD-10-CM | POA: Insufficient documentation

## 2021-05-19 DIAGNOSIS — Z79899 Other long term (current) drug therapy: Secondary | ICD-10-CM | POA: Insufficient documentation

## 2021-05-19 DIAGNOSIS — N186 End stage renal disease: Secondary | ICD-10-CM | POA: Insufficient documentation

## 2021-05-19 DIAGNOSIS — R0789 Other chest pain: Secondary | ICD-10-CM

## 2021-05-19 LAB — COMPREHENSIVE METABOLIC PANEL
ALT: 10 U/L (ref 0–44)
AST: 20 U/L (ref 15–41)
Albumin: 3.1 g/dL — ABNORMAL LOW (ref 3.5–5.0)
Alkaline Phosphatase: 110 U/L (ref 38–126)
Anion gap: 14 (ref 5–15)
BUN: 29 mg/dL — ABNORMAL HIGH (ref 8–23)
CO2: 22 mmol/L (ref 22–32)
Calcium: 8.5 mg/dL — ABNORMAL LOW (ref 8.9–10.3)
Chloride: 97 mmol/L — ABNORMAL LOW (ref 98–111)
Creatinine, Ser: 5.68 mg/dL — ABNORMAL HIGH (ref 0.44–1.00)
GFR, Estimated: 8 mL/min — ABNORMAL LOW (ref >60)
Glucose, Bld: 133 mg/dL — ABNORMAL HIGH (ref 70–99)
Potassium: 4.7 mmol/L (ref 3.5–5.1)
Sodium: 133 mmol/L — ABNORMAL LOW (ref 135–145)
Total Bilirubin: 0.6 mg/dL (ref 0.3–1.2)
Total Protein: 7.2 g/dL (ref 6.5–8.1)

## 2021-05-19 LAB — CBC WITH DIFFERENTIAL/PLATELET
Abs Immature Granulocytes: 0.05 10*3/uL (ref 0.00–0.07)
Basophils Absolute: 0 10*3/uL (ref 0.0–0.1)
Basophils Relative: 0 %
Eosinophils Absolute: 0 10*3/uL (ref 0.0–0.5)
Eosinophils Relative: 0 %
HCT: 30.8 % — ABNORMAL LOW (ref 36.0–46.0)
Hemoglobin: 9.5 g/dL — ABNORMAL LOW (ref 12.0–15.0)
Immature Granulocytes: 1 %
Lymphocytes Relative: 5 %
Lymphs Abs: 0.4 10*3/uL — ABNORMAL LOW (ref 0.7–4.0)
MCH: 27 pg (ref 26.0–34.0)
MCHC: 30.8 g/dL (ref 30.0–36.0)
MCV: 87.5 fL (ref 80.0–100.0)
Monocytes Absolute: 0.6 10*3/uL (ref 0.1–1.0)
Monocytes Relative: 7 %
Neutro Abs: 6.8 10*3/uL (ref 1.7–7.7)
Neutrophils Relative %: 87 %
Platelets: 105 10*3/uL — ABNORMAL LOW (ref 150–400)
RBC: 3.52 MIL/uL — ABNORMAL LOW (ref 3.87–5.11)
RDW: 16.2 % — ABNORMAL HIGH (ref 11.5–15.5)
WBC: 7.8 10*3/uL (ref 4.0–10.5)
nRBC: 0 % (ref 0.0–0.2)

## 2021-05-19 LAB — TROPONIN I (HIGH SENSITIVITY)
Troponin I (High Sensitivity): 9 ng/L (ref <18)
Troponin I (High Sensitivity): 10 ng/L (ref <18)

## 2021-05-19 LAB — LIPASE, BLOOD: Lipase: 27 U/L (ref 11–51)

## 2021-05-19 MED ORDER — KETOROLAC TROMETHAMINE 30 MG/ML IJ SOLN
30.0000 mg | Freq: Once | INTRAMUSCULAR | Status: AC
  Administered 2021-05-19: 30 mg via INTRAVENOUS
  Filled 2021-05-19: qty 1

## 2021-05-19 NOTE — ED Provider Notes (Signed)
[facility] EMERGENCY DEPARTMENT Provider Note   CSN: [id number] Arrival date & time: 05/19/21  0139     History  Chief Complaint  Patient presents with   Atrial Fibrillation    A-fib RVR    Alicia [name] is a 66 y.o. female.  Patient presenting with chest pain and palpitations that started earlier this evening.  She denies any shortness of breath, nausea, diaphoresis, or radiation of her pain.  She was found by EMS to be in atrial fibrillation with RVR.  She was given Cardizem intravenously by EMS and A-fib resolved shortly upon arrival to the ER.  Patient denies any history of coronary artery disease, stents, or prior heart cath.  The history is provided by the patient.  Atrial Fibrillation This is a new problem. The current episode started 3 to 5 hours ago. The problem occurs constantly. The problem has been resolved. Associated symptoms include chest pain. Pertinent negatives include no shortness of breath. Nothing aggravates the symptoms. Nothing relieves the symptoms. She has tried nothing for the symptoms.      Home Medications Prior to Admission medications   Medication Sig Start Date End Date Taking? Authorizing Provider  acetaminophen (TYLENOL) 500 MG tablet Take 500-1,000 mg by mouth every 6 (six) hours as needed for moderate pain or headache.    [provider]  amLODipine (NORVASC) 10 MG tablet Take 10 mg by mouth daily. 05/14/20   [provider]  calcitRIOL (ROCALTROL) 0.5 MCG capsule Take 0.5 mcg by mouth daily.    [provider]  calcium carbonate (TUMS - DOSED IN MG ELEMENTAL CALCIUM) 500 MG chewable tablet Chew 2 tablets by mouth daily as needed for indigestion or heartburn.    [provider]  CALCIUM PO Take 500 mg by mouth daily.    [provider]  ferrous sulfate 325 (65 FE) MG tablet Take 325 mg by mouth once a week.    [provider]  furosemide (LASIX) 40 MG tablet Take 40 mg by  mouth daily. 02/21/20   [provider]  hydrALAZINE (APRESOLINE) 100 MG tablet Take 100 mg by mouth 2 (two) times daily. 05/16/20   [provider]  hydrOXYzine (ATARAX/VISTARIL) 25 MG tablet Take 1 tablet (25 mg total) by mouth every 8 (eight) hours as needed for itching. 12/17/20   Long, Wonda Olds, MD  loperamide (IMODIUM) 2 MG capsule Take 4 mg by mouth as needed for diarrhea or loose stools.    [provider]  Magnesium Oxide 250 MG TABS Take 250 mg by mouth 2 (two) times a week.    [provider]  omeprazole (PRILOSEC) 20 MG capsule Take 20 mg by mouth daily as needed (reflux).    [provider]  oxyCODONE-acetaminophen (PERCOCET) 5-325 MG tablet Take 1 tablet by mouth every 6 (six) hours as needed for severe pain. 02/19/21 02/19/22  Baglia, Corrina, PA-C  sevelamer carbonate (RENVELA) 800 MG tablet Take 800 mg by mouth 3 (three) times daily with meals.    [provider]  sodium chloride (OCEAN) 0.65 % SOLN nasal spray Place 1 spray into both nostrils as needed for congestion.    [provider]  spironolactone (ALDACTONE) 25 MG tablet Take 25 mg by mouth daily as needed (leg swelling). 08/29/20   [provider]      Allergies    Compazine [prochlorperazine edisylate], Flexeril [cyclobenzaprine], and Meclizine    Review of Systems   Review of Systems  Respiratory:  Negative for shortness of breath.   Cardiovascular:  Positive for chest pain.  All other systems reviewed and are negative.  Physical Exam Updated Vital Signs BP (!) 141/81 (BP Location: Right Arm)    Pulse (!) 138    Temp 99 F (37.2 C) (Oral)    Resp (!) 26    SpO2 100%  Physical Exam Vitals and nursing note reviewed.  Constitutional:      General: She is not in acute distress.    Appearance: She is well-developed. She is not diaphoretic.  HENT:     Head: Normocephalic and atraumatic.  Cardiovascular:     Rate and Rhythm: Normal rate and  regular rhythm.     Heart sounds: No murmur heard.   No friction rub. No gallop.  Pulmonary:     Effort: Pulmonary effort is normal. No respiratory distress.     Breath sounds: Normal breath sounds. No wheezing.  Abdominal:     General: Bowel sounds are normal. There is no distension.     Palpations: Abdomen is soft.     Tenderness: There is no abdominal tenderness.  Musculoskeletal:        General: Normal range of motion.     Cervical back: Normal range of motion and neck supple.  Skin:    General: Skin is warm and dry.  Neurological:     General: No focal deficit present.     Mental Status: She is alert and oriented to person, place, and time.    ED Results / Procedures / Treatments   Labs (all labs ordered are listed, but only abnormal results are displayed) Labs Reviewed  COMPREHENSIVE METABOLIC PANEL  CBC WITH DIFFERENTIAL/PLATELET  LIPASE, BLOOD  TROPONIN I (HIGH SENSITIVITY)    EKG EKG Interpretation  Date/Time:  Saturday May 19 2021 01:40:36 EST Ventricular Rate:  139 PR Interval:    QRS Duration: 73 QT Interval:  288 QTC Calculation: 438 R Axis:   83 Text Interpretation: Atrial fibrillation Borderline right axis deviation Anteroseptal infarct, old Borderline repolarization abnormality Confirmed by Veryl Speak [phone]) on 05/19/2021 2:31:25 AM  Radiology No results found.  Procedures Procedures  Continuous cardiac monitoring  Medications Ordered in ED Medications  ketorolac (TORADOL) 30 MG/ML injection 30 mg (has no administration in time range)    ED Course/ Medical Decision Making/ A&P  This patient presents to the ED for concern of chest pain, this involves an extensive number of treatment options, and is a complaint that carries with it a high risk of complications and morbidity.  The differential diagnosis includes acute coronary syndrome, pulmonary embolism, aortic dissection, pneumothorax, musculoskeletal/costochondritis   Co morbidities  that complicate the patient evaluation  End-stage renal disease on hemodialysis   Additional history obtained:  No additional history or external records needed   Lab Tests:  I Ordered, and personally interpreted labs.  The pertinent results include: Unremarkable troponin x2.  CBC is unremarkable and metabolic panel consistent with baseline as patient is on dialysis   Imaging Studies ordered:  I ordered imaging studies including chest x-ray I independently visualized and interpreted imaging which showed no acute process I agree with the radiologist interpretation   Cardiac Monitoring:  The patient was maintained on a cardiac monitor.  I personally viewed and interpreted the cardiac monitored which showed an underlying rhythm of: Initial atrial fibrillation upon presentation which converted to sinus rhythm spontaneously within minutes of arrival.  Work-up initiated including laboratory studies and troponin.  Troponin x2 has returned as  negative.  She did receive Toradol   Medicines ordered and prescription drug management:  I ordered medication including Toradol for pain Reevaluation of the patient after these medicines showed that the patient resolved I have reviewed the patients home medicines and have made adjustments as needed   Test Considered:  No other test considered   Critical Interventions:  Intravenous Toradol   Consultations Obtained:  No consultations requested   Problem List / ED Course:  Patient presenting with sharp pain to the front of her chest that started earlier this evening.  There is no associated shortness of breath, nausea, or diaphoresis.  Upon presentation she was initially in A-fib with RVR, but converted to sinus rhythm spontaneously within minutes of arrival.  Work-up initiated including laboratory studies and troponin.  Troponin x2 has returned as negative.  She did receive Toradol and pain is completely gone. I highly doubt a cardiac  etiology.  Given that the patient is a dialysis patient with complicated medical history, I feel as though patient should be referred to cardiology for further work-up.  I have refrained from initiating anticoagulation given her complex history.   Reevaluation:  After the interventions noted above, I reevaluated the patient and found that they have :resolved  Social Determinants of Health:  None   Dispostion:  After consideration of the diagnostic results and the patients response to treatment, I feel that the patent would benefit from discharge with outpatient follow-up.    Final Clinical Impression(s) / ED Diagnoses Final diagnoses:  None    Rx / DC Orders ED Discharge Orders     None         Veryl Speak, MD 05/19/21 2353

## 2021-05-19 NOTE — Discharge Instructions (Signed)
Continue medications as previously prescribed.  Follow-up with cardiology in the next 3 to 4 days.  The contact information for the cardiology clinic on [facility] has been provided in this discharge summary for you to call and make these arrangements.  Return to the emergency department if symptoms significantly worsen or change.

## 2021-05-19 NOTE — ED Triage Notes (Signed)
Pt arrives to ED BIB [facility] due to A-Fib RVR. Per EMS pt began having Central CP that began at 1800 yesterday and pt thought it was gerd. Per EMS pt still having CP and upon their arrival it was noted that pt was in A-Fib RVR. Per EMS pt was given two 10mg  cardizem IV (20mg  total). Per EMS pt is Dialysis pt Tue,Thur, Sat and last full Tx was Thursday. Hx HTN, Breast Cancer and Cirrhosis of the Liver. Pt A/O x4.

## 2021-05-21 ENCOUNTER — Encounter: Payer: Self-pay | Admitting: Internal Medicine

## 2021-05-21 ENCOUNTER — Other Ambulatory Visit: Payer: Self-pay

## 2021-05-21 ENCOUNTER — Ambulatory Visit (INDEPENDENT_AMBULATORY_CARE_PROVIDER_SITE_OTHER): Payer: Medicare (Managed Care) | Admitting: Internal Medicine

## 2021-05-21 VITALS — BP 128/64 | HR 96 | Ht 60.0 in | Wt 171.2 lb

## 2021-05-21 DIAGNOSIS — I4891 Unspecified atrial fibrillation: Secondary | ICD-10-CM

## 2021-05-21 MED ORDER — APIXABAN 2.5 MG PO TABS: 2.5000 mg | ORAL_TABLET | Freq: Two times a day (BID) | ORAL | 3 refills | Status: DC

## 2021-05-21 MED ORDER — METOPROLOL SUCCINATE ER 25 MG PO TB24: 12.5000 mg | ORAL_TABLET | Freq: Every day | ORAL | 3 refills | Status: DC

## 2021-05-21 NOTE — Progress Notes (Signed)
Cardiology Office Note:    Date:  05/21/2021   ID:  Alicia [name], DOB [birth year], MRN [id number]  PCP:  Juanda Chance   [facility] Providers Cardiologist:  Janina Mayo, MD     Referring MD: Mindi Curling, PA-C   No chief complaint on file. New Onset Atrial Fibrillation   History of Present Illness:    Alicia [name] is a 66 y.o. 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  She denies any shortness of breath, nausea, diaphoresis, or radiation of her pain.  She was found by EMS to be in atrial fibrillation with RVR.  She was given Cardizem intravenously by EMS and A-fib resolved shortly upon arrival to the ER.  Patient denies any history of coronary artery disease, stents, or prior heart cath. She states prior to the ED visit, she could not breath well and she was having difficulty with breathing. Then EMS was called. She has no hx of atrial fibrillation. Her oldest son has it, he's 47 years old. She was on anticoagulation for unprovoked DVT In the setting of malginancy in the past  and was told to stop. She denies bleeding history. Her hgb 8-9.  No history of sleep apnea. She follows with oncology at [facility] per above. She did not make her last appointment for PET scan to assess for disease burden. It was felt that she was hesistant about resuming chemotherapy. She moved here 5 years ago from  [place]  Past Medical History:  Diagnosis Date   Anxiety    Ascites    Breast cancer ([place])    mets   Cancer ([place])    stomach   Chronic anticoagulation    reported on for DVT prophylaxis, possibly in setting of cancer, but Eliquis d/c'd 08/2016 in setting of anemia/liver disease/acites   Cirrhosis ([place])    CKD (chronic kidney disease), stage III ([place])    Dailysis T-TH-S   Dyspnea    with fluid buildup, no current problems 01/10/21   Esophageal varices ([place]) 11/2016   Small noted on ENDO   GERD (gastroesophageal reflux disease)    Headache    otc med prn   History of blood transfusion 08/2020   Hypertension    Hypotension    Pneumonia    Portal hypertension ([place])     Past Surgical History:  Procedure Laterality Date   ABDOMINAL HYSTERECTOMY     AV FISTULA PLACEMENT Right 09/01/2020   Procedure: RIGHT ARM ARTERIOVENOUS 1st STAGE BASILIC;  Surgeon: Marty Heck, MD;  Location: [place];  Service: Vascular;  Laterality: Right;   AV FISTULA PLACEMENT Left 02/12/2021   Procedure: LEFT ARM FIRST STAGE BASILIC ARTERIOVENOUS (AV) FISTULA CREATION;  Surgeon: Marty Heck, MD;  Location: [place];  Service: Vascular;  Laterality: Left;   [place] Right 01/12/2021   Procedure: RIGHT ARM SECOND STAGE BASILIC VEIN TRANSPOSITION;  Surgeon: Marty Heck, MD;  Location: [place];  Service: Vascular;  Laterality: Right;   BREAST LUMPECTOMY     BREAST SURGERY     CESAREAN SECTION     COLONOSCOPY WITH PROPOFOL N/A 05/25/2019   Procedure: COLONOSCOPY WITH PROPOFOL;  Surgeon: Wilford Corner, MD;  Location: [facility];  Service: Endoscopy;  Laterality: N/A;   ESOPHAGOGASTRODUODENOSCOPY (EGD) WITH PROPOFOL N/A 11/21/2016   Procedure: ESOPHAGOGASTRODUODENOSCOPY;  Surgeon: Milus Banister, MD;  Location: [facility];  Service: Endoscopy;  Laterality: N/A;   ESOPHAGOGASTRODUODENOSCOPY (EGD) WITH PROPOFOL N/A 05/25/2019   Procedure: ESOPHAGOGASTRODUODENOSCOPY  (EGD) WITH PROPOFOL;  Surgeon: Wilford Corner, MD;  Location: [facility];  Service: Endoscopy;  Laterality: N/A;   INSERTION OF DIALYSIS CATHETER Left 09/01/2020   Procedure: INSERTION OF A LEFT INTERNAL JUGULAR TUNNELLED DIALYSIS CATHETER. ULTRASOUND GUIDED;  Surgeon: Marty Heck, MD;  Location: [place];  Service: Vascular;  Laterality: Left;   IR PARACENTESIS  09/04/2016   IR PARACENTESIS  09/20/2016   IR PARACENTESIS  10/22/2016   IR PARACENTESIS  01/27/2017   IR PARACENTESIS  04/11/2017   IR PARACENTESIS  06/05/2017   IR PARACENTESIS  06/05/2017   IR PARACENTESIS  06/19/2017   IR PARACENTESIS  07/03/2017   IR PARACENTESIS  07/17/2017   IR PARACENTESIS  08/01/2017   IR PARACENTESIS  08/14/2017   IR PARACENTESIS  08/28/2017   IR PARACENTESIS  09/11/2017   IR PARACENTESIS  09/25/2017   IR PARACENTESIS  10/08/2017   IR PARACENTESIS  10/23/2017   IR PARACENTESIS  11/06/2017   IR PARACENTESIS  11/20/2017   IR PARACENTESIS  12/04/2017   IR PARACENTESIS  12/18/2017   IR PARACENTESIS  01/19/2018   IR PARACENTESIS  02/09/2018   IR PARACENTESIS  02/24/2018   IR PARACENTESIS  03/16/2018   IR PARACENTESIS  04/10/2018   IR PARACENTESIS  05/08/2018   IR PARACENTESIS  06/11/2018   IR PARACENTESIS  07/07/2018   IR PARACENTESIS  09/04/2018   IR PARACENTESIS  10/22/2018   IR PARACENTESIS  11/30/2018   IR PARACENTESIS  01/14/2019   IR PARACENTESIS  04/30/2019   IR PARACENTESIS  07/19/2019   IR PARACENTESIS  09/05/2020   IR RADIOLOGIST EVAL & MGMT  12/23/2017   LYMPH NODE DISSECTION     ULTRASOUND GUIDANCE FOR VASCULAR ACCESS Left 09/01/2020   Procedure: ULTRASOUND GUIDANCE FOR VASCULAR ACCESS;  Surgeon: Marty Heck, MD;  [facility];  Service: Vascular;  Laterality: Left;    Current Medications: Current Meds  Medication Sig   acetaminophen (TYLENOL) 500 MG tablet Take 500-1,000 mg by mouth every 6 (six) hours as needed for moderate pain or headache.    amLODipine (NORVASC) 10 MG tablet Take 10 mg by mouth daily.   apixaban (ELIQUIS) 2.5 MG TABS tablet Take 1 tablet (2.5 mg total) by mouth 2 (two) times daily.   calcitRIOL (ROCALTROL) 0.5 MCG capsule Take 0.5 mcg by mouth daily.   calcium carbonate (TUMS - DOSED IN MG ELEMENTAL CALCIUM) 500 MG chewable tablet Chew 2 tablets by mouth daily as needed for indigestion or heartburn.   CALCIUM PO Take 500 mg by mouth daily.   ferrous sulfate 325 (65 FE) MG tablet Take 325 mg by mouth once a week.   furosemide (LASIX) 40 MG tablet Take 40 mg by mouth daily.   hydrALAZINE (APRESOLINE) 100 MG tablet Take 100 mg by mouth 2 (two) times daily.   loperamide (IMODIUM) 2 MG capsule Take 4 mg by mouth as needed for diarrhea or loose stools.   Magnesium Oxide 250  MG TABS Take 250 mg by mouth 2 (two) times a week.   metoprolol succinate (TOPROL-XL) 25 MG 24 hr tablet Take 0.5 tablets (12.5 mg total) by mouth daily. Take with or immediately following a meal.   omeprazole (PRILOSEC) 20 MG capsule Take 20 mg by mouth daily as needed (reflux).   oxyCODONE-acetaminophen (PERCOCET) 5-325 MG tablet Take 1 tablet by mouth every 6 (six) hours as needed for severe pain.   sevelamer carbonate (RENVELA) 800 MG tablet Take 800 mg by mouth 3 (three) times daily with meals.   sodium chloride (OCEAN) 0.65 % SOLN nasal spray Place 1 spray into both nostrils as needed for congestion.   spironolactone (ALDACTONE) 25 MG tablet Take 25 mg by mouth daily as needed (leg swelling).     Allergies:   Compazine [prochlorperazine edisylate], Flexeril [cyclobenzaprine], and Meclizine   Social History   Socioeconomic History   Marital status: Single    Spouse name: Not on file   Number of children: Not on file   Years of education: Not on file   Highest education level: Not on file  Occupational History   Not on file  Tobacco Use   Smoking status: Some Days    Types: Cigarettes   Smokeless tobacco: Never   Tobacco comments:     Cigar 2-3 times a year   Vaping Use   Vaping Use: Never used  Substance and Sexual Activity   Alcohol use: Not Currently    Comment: occassional wine, no ETOH since 11/2020   Drug use: No   Sexual activity: Not on file  Other Topics Concern   Not on file  Social History Narrative   Not on file   Social Determinants of Health   Financial Resource Strain: Not on file  Food Insecurity: Not on file  Transportation Needs: Not on file  Physical Activity: Not on file  Stress: Not on file  Social Connections: Not on file     Family History: The patient's family history includes Diabetes in her brother, father, and mother. There is no history of Colon cancer, Stomach cancer, or Esophageal cancer.  ROS:   Please see the history of present illness.     All other systems reviewed and are negative.  EKGs/Labs/Other Studies Reviewed:    The following studies were reviewed today:   EKG:  EKG is  ordered today.  The ekg ordered today demonstrates    NSR, LAE, poor R wave progression, Qtc 477 ms  Recent Labs: 08/30/2020: B Natriuretic Peptide 57.4 09/01/2020: Magnesium 1.9 05/19/2021: ALT 10; BUN 29; Creatinine, Ser 5.68; Hemoglobin 9.5; Platelets 105; Potassium 4.7; Sodium 133  Recent Lipid Panel No results found for: CHOL, TRIG, HDL, CHOLHDL, VLDL, LDLCALC, LDLDIRECT   Risk Assessment/Calculations:    CHA2DS2-VASc Score = 2   This indicates a 2.2% annual risk of stroke. The patient's score is based upon: CHF History: 0 HTN History: 1 Diabetes History: 0 Stroke History: 0 Vascular Disease History: 0 Age Score: 0 Gender Score: 1           Physical Exam:    VS:   Vitals:   05/21/21 1544  BP: 128/64  Pulse: 96  SpO2: 99%     BP 128/64    Pulse 96    Ht 5' (1.524 m)    Wt 171 lb 3.2 oz (77.7 kg)    SpO2 99%    BMI 33.44 kg/m     Wt Readings from Last 3 Encounters:  05/21/21  171 lb 3.2 oz (77.7 kg)  03/21/21 153 lb 12.8 oz (69.8 kg)  02/12/21 149 lb (67.6 kg)      GEN:  Well nourished, well developed in no acute distress HEENT: Normal NECK: No JVD; No carotid bruits LYMPHATICS: No lymphadenopathy CARDIAC: RRR, , thrill heard from fistula,  RESPIRATORY:  Clear to auscultation without rales, wheezing or rhonchi  ABDOMEN: mild distention MUSCULOSKELETAL:  No edema; No deformity. Left basilic fistula with + thrill SKIN: Warm and dry NEUROLOGIC:  Alert and oriented x 3 PSYCHIATRIC:  Normal affect   ASSESSMENT:    New Onset Atrial Fibrillation: She has Chads2vasc 2; however prediction in cancer patients is not as accurate than in non cancer patients. Leader et al. Arterial Thromboembolism in Patients with Atrial Fibrillation Chads2vasc 0 to 2 with and without cancer. Jacc CardioOnc 2023. She does have risk of bleeding with cirrhosis/varices, but she denies any hx of significant bleeding. Her hgb is stable. Will plan to start eliquis and BB. In terms of AAD, amiodarone is not an ideal option with cirrhosis. Dofetilide/sotalol may be options.  If she has bleeding, then risk of bleed is greater than benefit of stroke risk reduction and eliquis can be stopped. If she plans to restart taxol this is fine from a cardiac perspective, no contraindications. - start eiquis - start low dose metop XL - TTE     PLAN:    In order of problems listed above:  TTE 2.5 mg eliquis BID Metoprolol XL 12.5 mg Follow up 3 months           Medication Adjustments/Labs and Tests Ordered: Current medicines are reviewed at length with the patient today.  Concerns regarding medicines are outlined above.  Orders Placed This Encounter  Procedures   EKG 12-Lead   ECHOCARDIOGRAM COMPLETE   Meds ordered this encounter  Medications   apixaban (ELIQUIS) 2.5 MG TABS tablet    Sig: Take 1 tablet (2.5 mg total) by mouth 2 (two) times daily.    Dispense:  60 tablet    Refill:  3   metoprolol succinate (TOPROL-XL) 25 MG 24 hr tablet    Sig: Take 0.5 tablets (12.5 mg  total) by mouth daily. Take with or immediately following a meal.    Dispense:  45 tablet    Refill:  3    Patient Instructions  Medication Instructions:  START: ELIQUIS 2.5mg  TWICE DAILY  START: METOPROLOL SUCCINATE 12.5mg  ONCE DAILY  *If you need a refill on your cardiac medications before your next appointment, please call your pharmacy*  Testing/Procedures: Your physician has requested that you have an echocardiogram. Echocardiography is a painless test that uses sound waves to create images of your heart. It provides your doctor with information about the size and shape of your heart and how well your hearts chambers and valves are working. You may receive an ultrasound enhancing agent through an IV if needed to better visualize your heart during the echo.This procedure takes approximately one hour. There are no restrictions for this procedure. This will take place at the [address] [place] [address].   Follow-Up: At [facility], you and your health needs are our priority.  As part of our continuing mission to provide you with exceptional heart care, we have created designated Provider Care Teams.  These Care Teams include your primary Cardiologist (physician) and Advanced Practice Providers (APPs -  Physician Assistants and Nurse Practitioners) who all work together to provide you with the care you need,  when you need it.  Your next appointment:   3 month(s)  The format for your next appointment:   In Person  Provider:   Janina Mayo, MD      Signed, Janina Mayo, MD  05/21/2021 5:01 PM    [place]

## 2021-05-21 NOTE — Patient Instructions (Signed)
Medication Instructions:  START: YQMGNOI 2.5mg  TWICE DAILY  START: METOPROLOL SUCCINATE 12.5mg  ONCE DAILY  *If you need a refill on your cardiac medications before your next appointment, please call your pharmacy*  Testing/Procedures: Your physician has requested that you have an echocardiogram. Echocardiography is a painless test that uses sound waves to create images of your heart. It provides your doctor with information about the size and shape of your heart and how well your hearts chambers and valves are working. You may receive an ultrasound enhancing agent through an IV if needed to better visualize your heart during the echo.This procedure takes approximately one hour. There are no restrictions for this procedure. This will take place at the [address], [address].   Follow-Up: At [facility], you and your health needs are our priority.  As part of our continuing mission to provide you with exceptional heart care, we have created designated Provider Care Teams.  These Care Teams include your primary Cardiologist (physician) and Advanced Practice Providers (APPs -  Physician Assistants and Nurse Practitioners) who all work together to provide you with the care you need, when you need it.  Your next appointment:   3 month(s)  The format for your next appointment:   In Person  Provider:   Janina Mayo, MD

## 2021-05-22 ENCOUNTER — Telehealth: Payer: Self-pay | Admitting: Internal Medicine

## 2021-05-22 NOTE — Telephone Encounter (Signed)
Patient calling the office for samples of medication:   1.  What medication and dosage are you requesting samples for? apixaban (ELIQUIS) 2.5 MG TABS tablet  2.  Are you currently out of this medication? No  Patient has not started taking the medication yet. She said it would cost her $195 a month and she can not afford the medication.

## 2021-05-22 NOTE — Telephone Encounter (Addendum)
Spoke with patient who has applied for medicare prescription coverage that will kick in in March. She is asking for eliquis 2.5 mg samples to get started. Reviewed with K. [place] pharmacist. Pt will pick up 2 week supply tomorrow along with an assistance card.

## 2021-05-23 NOTE — Telephone Encounter (Signed)
Returned call to patient, she will pick up samples today- also advised patient that patient assistance for eliquis will be left at the front desk as well for her to pick up. Advised her to fill out and return to office when able. Patient aware and verbalized understanding.

## 2021-06-01 ENCOUNTER — Ambulatory Visit (HOSPITAL_COMMUNITY): Payer: Medicare (Managed Care) | Attending: Cardiology

## 2021-06-01 ENCOUNTER — Other Ambulatory Visit: Payer: Self-pay

## 2021-06-01 DIAGNOSIS — I4891 Unspecified atrial fibrillation: Secondary | ICD-10-CM

## 2021-06-01 LAB — ECHOCARDIOGRAM COMPLETE
Area-P 1/2: 5.62 cm2
S' Lateral: 2.4 cm

## 2021-06-06 ENCOUNTER — Telehealth: Payer: Self-pay | Admitting: Internal Medicine

## 2021-06-06 NOTE — Telephone Encounter (Signed)
Patient calling to go over her results from her echo, please advise.  ?

## 2021-06-06 NOTE — Telephone Encounter (Signed)
Called patient, gave results.  ?Patient verbalized understanding.  ? ? ?

## 2021-06-14 ENCOUNTER — Telehealth: Payer: Self-pay

## 2021-06-14 ENCOUNTER — Encounter (HOSPITAL_COMMUNITY): Payer: Self-pay | Admitting: Physician Assistant

## 2021-06-14 ENCOUNTER — Encounter (HOSPITAL_COMMUNITY): Payer: Self-pay | Admitting: Vascular Surgery

## 2021-06-14 ENCOUNTER — Other Ambulatory Visit: Payer: Self-pay

## 2021-06-14 NOTE — Telephone Encounter (Signed)
-----   Message from Marty Heck, MD sent at 06/14/2021  4:07 PM EST ----- ?Regarding: RE: FYI ?No.  I cannot do a second stage BVT on Eliquis.  This would have to be held.  She can be rescheduled if needed. ? ?Thanks, ? ?Chris ?----- Message ----- ?From: Nicholas Lose, RN ?Sent: 06/14/2021   4:05 PM EST ?To: Marty Heck, MD ?Subject: FYI                                           ? ?Dr. Carlis Abbott ? ?Pt is scheduled for second stage BVT on Monday. I received a notification from preadmission nurse advising patient was seen by Dr. Harl Bowie on 05/21/21 and dx with new onset Afib. She is now on Eliquis 2.5 mg BID. Are you ok continuing with surgery as planned while on Eliquis?  ? ?Herma Ard ? ? ?

## 2021-06-14 NOTE — Telephone Encounter (Signed)
Spoke with Herma Ard at Dr. Ainsley Spinner office who called to ask about patient's diagnosis of atrial fibrillation. I made her aware that patient stopped her metoprolol and Eliqus. She states she will review with Dr. Carlis Abbott but feels like they will likely postpone the patient's procedure. I advised her to call back with questions and to submit a new request when surgery is scheduled so that we can advise on holding Eliquis per Dr. Ainsley Spinner recommendation. She thanked me for my assistance.  ?

## 2021-06-14 NOTE — Progress Notes (Addendum)
Spoke with pt for pre-op call. Pt had an EKG done in the ED February 11/23 which showed A-fib. Pt was seen by Dr. Harl Bowie on 05/21/21. She was started on on Eliquis and Metoprolol. She had an Echo on 06/01/21. She states she was told that her heart was normal and was told to stop both the Eliquis and Metoprolol. There is a note that she was given results, but nothing about stopping Eliquis and Metoprolol.  ? ?Pt is not diabetic. Pt is on dialysis.  ? ?Pt's surgery is scheduled as ambulatory so no Covid test is required prior to surgery. ? ?Addendum: I notified Dr. Nelly Laurence LPN Almyra Free about this and she states pt was not told this from their office and she needs to be on both medications. I asked her if she would call pt and tell her this information. I also called [place] at Dr. Ainsley Spinner office to inform him that pt was started on Eliquis in February and is to start it back. Pt was started on Eliquis since her last visit to Dr. Carlis Abbott. [place] states she will give Dr. Carlis Abbott the message ? ?

## 2021-06-14 NOTE — Telephone Encounter (Signed)
Received message from Lilia Pro, RN- she advised that patient was having surgery on 03/13- patient advised with her that she had stopped her Eliquis and Metoprolol, via ECHO results (she was under the impression she no longer had AFIB and did not need to take them any longer) I did not advise patient to stop medications, no medications were mentioned during discussion of ECHO results.  ? ?I contacted patient who states that we did not tell her to stop this, however she thought since her heart function was normal she could stop and self-discontinued.  I did advise patient that she should have been on these medications. We did discuss the need for the medications. I did advise the RN from surgery center- she will notify surgeon that patient has not been on it, and was going to restart. I will notify Dr.Branch of this, I did advise patient that I was not sure what would happen with the surgery on Monday, but they would notify patient if any changes.  ? ?

## 2021-06-14 NOTE — Telephone Encounter (Signed)
Spoke with Sharyn Lull at [facility] regarding patient's new onset of Afib. She informed me that patient had previously stopped taking her rate control medication metoprolol and eliquis because she thought it was no longer needed it and recently started back once staff recognized this. Stated probably best for patient to get steady rate and medication control, then proceed with surgery.  ? ?Discussed the above Dr. Carlis Abbott who agreed. Recommends to postpone surgery for 1 month after consistently taking medication, then patient will need to hold Eliqius 48 hours prior. ? ?Attempted to reach patient to discuss. No answer. Left message for patient to return call.   ? ?  ?

## 2021-06-15 NOTE — Telephone Encounter (Signed)
Per Dr. Harl Bowie ? ?We took care of it in terms of communicating with her. She could have had surgery done without AC. I will leave it up to them.  ? ?I will remove from preop pool. ? ?Thanks ?Angie ? ?

## 2021-06-15 NOTE — Telephone Encounter (Signed)
Dr. Harl Bowie ?Through a preop request, sounds like the patient has stopped eliquis and BB. They are postponing her surgery now, so I will remove the request from the preop pool, but am concerned she isn't taking [place]. Can  you have your assist reach out to her to make sure she is taking the medications you have recommended? ? ?Thanks ?Angie ? ?

## 2021-06-15 NOTE — Telephone Encounter (Signed)
Spoke with patient and informed her of provider recommendations. Patient verbalized understanding. She rescheduled surgery for 08/03/21. Will mail patient updated instructions letter.  ?

## 2021-06-19 ENCOUNTER — Telehealth: Payer: Self-pay | Admitting: Internal Medicine

## 2021-06-19 NOTE — Telephone Encounter (Addendum)
?  Indication: afib, dvt ?Last office visit: 05/21/2021 ?Scr: 5.68, 05/19/2021 ?Age: 66 yo  ?Weight: 77.7 kg  ? ? ?Per Dr. Nelly Laurence note from 2/13 pt was prescribed Eliquis 2.'5mg'$  BID. Discussed with Claiborne Billings D pt has hx of esophogeal varices will keep pt on Eliquis 2.'5mg'$  BID.  ? ? ?

## 2021-06-19 NOTE — Telephone Encounter (Signed)
Returned call to patient and advised that 3 boxes of Eliquis 2.'5mg'$  left at front desk for her to pick up.  ? ?LOT: [id number] ?EXP: 12/2021 ? ?Also left patient assistance forms with samples for patient to fill out and bring back. Patient aware and verbalized understanding.  ?

## 2021-06-19 NOTE — Telephone Encounter (Signed)
Patient calling the office for samples of medication:   1.  What medication and dosage are you requesting samples for? apixaban (ELIQUIS) 2.5 MG TABS tablet   2.  Are you currently out of this medication? Yes    

## 2021-07-25 ENCOUNTER — Encounter: Payer: Self-pay | Admitting: Internal Medicine

## 2021-07-25 ENCOUNTER — Ambulatory Visit (INDEPENDENT_AMBULATORY_CARE_PROVIDER_SITE_OTHER): Payer: Medicare (Managed Care) | Admitting: Internal Medicine

## 2021-07-25 VITALS — BP 162/72 | HR 72 | Ht 60.0 in | Wt 166.6 lb

## 2021-07-25 DIAGNOSIS — I4891 Unspecified atrial fibrillation: Secondary | ICD-10-CM

## 2021-07-25 MED ORDER — METOPROLOL SUCCINATE ER 25 MG PO TB24: 25.0000 mg | ORAL_TABLET | Freq: Every day | ORAL | 3 refills | Status: DC

## 2021-07-25 NOTE — Patient Instructions (Signed)
Medication Instructions:  ?INCREASE Metoprolol Succinate (Toprol-XL) to 25 mg daily  ? ?*If you need a refill on your cardiac medications before your next appointment, please call your pharmacy* ? ?Lab Work: ?NONE ordered at this time of appointment  ? ?If you have labs (blood work) drawn today and your tests are completely normal, you will receive your results only by: ?MyChart Message (if you have MyChart) OR ?A paper copy in the mail ?If you have any lab test that is abnormal or we need to change your treatment, we will call you to review the results. ? ?Testing/Procedures: ?NONE ordered at this time of appointment  ? ?Follow-Up: ?At [facility], you and your health needs are our priority.  As part of our continuing mission to provide you with exceptional heart care, we have created designated Provider Care Teams.  These Care Teams include your primary Cardiologist (physician) and Advanced Practice Providers (APPs -  Physician Assistants and Nurse Practitioners) who all work together to provide you with the care you need, when you need it.   ? ?Your next appointment:   ?6 month(s) ? ?The format for your next appointment:   ?In Person ? ?Provider:   ?Janina Mayo, MD   ? ?Other Instructions ? ? ?Important Information About Sugar ? ? ? ? ? ? ?

## 2021-07-25 NOTE — Progress Notes (Signed)
?Cardiology Office Note:   ? ?Date:  07/25/2021  ? ?ID:  Alicia [name], DOB [birth year], MRN [id number] ? ?PCP:  Mindi Curling, PA-C ?  ?[facility] Prov[facility] ?Cardiologist:  Janina Mayo, MD    ? ?Referring MD: Mindi Curling, PA-C  ? ?No chief complaint on file. ?New Onset Atrial Fibrillation  ? ?History of Present Illness:   ? ?Alicia [name] is a 66 y.o. 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 ?Per HPI in the ED: Patient presenting with chest pain and palpitations that started earlier this evening.  She denies any shortness of breath, nausea, diaphoresis, or radiation of her pain.  She was found by EMS to be in atrial fibrillation with RVR.  She was given Cardizem intravenously by EMS and A-fib resolved shortly upon arrival to the ER.  Patient denies any history of coronary artery disease, stents, or prior heart cath. She states prior to the ED visit, she could not breath well and she was having difficulty with breathing. Then EMS was called. She has no hx of atrial fibrillation. Her oldest son has it, he's 26 years old. She was on anticoagulation for unprovoked DVT In the setting of malginancy in the past  and was told to stop. She denies bleeding history. Her hgb 8-9.  No history of sleep apnea. She follows with oncology at [facility] per above. She did not make her last appointment for PET scan to assess for disease burden. It was felt that she was hesistant about resuming chemotherapy. She moved here 5 years ago from  [place]. ? ?Interim Hx: ? ?She underwent an echo which shows normal LV function.Grade II DD. E/e' 24 LA moderately dilated.  She continues with paracentesis. No hospitalizations. No bleeding or melena. No chest pain or SOB.  She's on lasix 40 mg daily and 25 mg of spironlactone. No progressive dyspnea on exertion. No swelling her legs.  No PND. ? ?Past Medical History:  ?Diagnosis Date  ? Anemia   ? Anxiety   ? Ascites   ? Breast cancer ([place])   ? mets  ? Cancer [facility])   ? stomach  ? Chronic anticoagulation   ? reported on for DVT prophylaxis, possibly in setting of cancer, but Eliquis d/c'd 08/2016 in setting of anemia/liver disease/acites  ? Cirrhosis ([place])   ? CKD (chronic kidney disease), stage III ([place])   ? Dailysis T-TH-S  ? Dyspnea   ? with fluid buildup, no current problems 01/10/21  ? Esophageal varices ([place]) 11/2016  ? Small noted on ENDO  ? GERD (gastroesophageal reflux disease)   ? Headache   ? otc med prn  ? History of blood transfusion 08/2020  ? Hypertension   ? Hypotension   ? Pneumonia   ? Portal hypertension ([facility])   ? ? ?Past Surgical History:  ?Procedure Laterality Date  ? ABDOMINAL HYSTERECTOMY    ? AV FISTULA PLACEMENT Right 09/01/2020  ? Procedure: RIGHT ARM ARTERIOVENOUS 1st STAGE BASILIC;  Surgeon: Marty Heck, MD;  Location: [place];  Service: Vascular;  Laterality:  Right;  ? AV FISTULA PLACEMENT Left 02/12/2021  ? Procedure: LEFT ARM FIRST STAGE BASILIC ARTERIOVENOUS (AV) FISTULA CREATION;  Surgeon: Marty Heck, MD;  Location: [place];  Service: Vascular;  Laterality: Left;  ? BASCILIC VEIN TRANSPOSITION Right 01/12/2021  ? Procedure: RIGHT ARM SECOND STAGE BASILIC VEIN TRANSPOSITION;  Surgeon: Marty Heck, MD;  Location: [place];  Service: Vascular;  Laterality: Right;  ? BREAST LUMPECTOMY    ? BREAST SURGERY    ? CESAREAN SECTION    ? COLONOSCOPY WITH PROPOFOL N/A 05/25/2019  ? Procedure: COLONOSCOPY WITH PROPOFOL;  Surgeon: Wilford Corner, MD;  Location: [facility];   Service: Endoscopy;  Laterality: N/A;  ? ESOPHAGOGASTRODUODENOSCOPY (EGD) WITH PROPOFOL N/A 11/21/2016  ? Procedure: ESOPHAGOGASTRODUODENOSCOPY;  Surgeon: Milus Banister, MD;  Location: [facility];  Service: Endoscopy;  Laterality: N/A;  ? ESOPHAGOGASTRODUODENOSCOPY (EGD) WITH PROPOFOL N/A 05/25/2019  ? Procedure: ESOPHAGOGASTRODUODENOSCOPY (EGD) WITH PROPOFOL;  Surgeon: Wilford Corner, MD;  Location: [facility];  Service: Endoscopy;  Laterality: N/A;  ? INSERTION OF DIALYSIS CATHETER Left 09/01/2020  ? Procedure: INSERTION OF A LEFT INTERNAL JUGULAR TUNNELLED DIALYSIS CATHETER. ULTRASOUND GUIDED;  Surgeon: Marty Heck, MD;  Location: [place];  Service: Vascular;  Laterality: Left;  ? IR PARACENTESIS  09/04/2016  ? IR PARACENTESIS  09/20/2016  ? IR PARACENTESIS  10/22/2016  ? IR PARACENTESIS  01/27/2017  ? IR PARACENTESIS  04/11/2017  ? IR PARACENTESIS  06/05/2017  ? IR PARACENTESIS  06/05/2017  ? IR PARACENTESIS  06/19/2017  ? IR PARACENTESIS  07/03/2017  ? IR PARACENTESIS  07/17/2017  ? IR PARACENTESIS  08/01/2017  ? IR PARACENTESIS  08/14/2017  ? IR PARACENTESIS  08/28/2017  ? IR PARACENTESIS  09/11/2017  ? IR PARACENTESIS  09/25/2017  ? IR PARACENTESIS  10/08/2017  ? IR PARACENTESIS  10/23/2017  ? IR PARACENTESIS  11/06/2017  ? IR PARACENTESIS  11/20/2017  ? IR PARACENTESIS  12/04/2017  ? IR PARACENTESIS  12/18/2017  ? IR PARACENTESIS  01/19/2018  ? IR PARACENTESIS  02/09/2018  ? IR PARACENTESIS  02/24/2018  ? IR PARACENTESIS  03/16/2018  ? IR PARACENTESIS  04/10/2018  ? IR PARACENTESIS  05/08/2018  ? IR PARACENTESIS  06/11/2018  ? IR PARACENTESIS  07/07/2018  ? IR PARACENTESIS  09/04/2018  ? IR PARACENTESIS  10/22/2018  ? IR PARACENTESIS  11/30/2018  ? IR PARACENTESIS  01/14/2019  ? IR PARACENTESIS  04/30/2019  ? IR PARACENTESIS  07/19/2019  ? IR PARACENTESIS  09/05/2020  ? IR RADIOLOGIST EVAL & MGMT  12/23/2017  ? LYMPH NODE DISSECTION    ? ULTRASOUND GUIDANCE FOR VASCULAR ACCESS Left  09/01/2020  ? Procedure: ULTRASOUND GUIDANCE FOR VASCULAR ACCESS;  Surgeon: Marty Heck, MD;  Location: [place];  Service: Vascular;  Laterality: Left;  ? ? ?Current Medications: ?No outpatient medications have been marked as taking for the 07/25/21 encounter (Appointment) with Janina Mayo, MD.  ?  ? ?Allergies:   Compazine [prochlorperazine edisylate], Flexeril [cyclobenzaprine], and Meclizine  ? ?Social History  ? ?Socioeconomic History  ? Marital status: Single  ?  Spouse name: Not on file  ? Number of children: Not on file  ? Years of education: Not on file  ? Highest education level: Not on file  ?Occupational History  ? Not on file  ?Tobacco Use  ? Smoking status: Former  ?  Types: Cigars  ? Smokeless tobacco: Never  ? Tobacco comments:  ?  Cigar 2-3 times a year   ?  Vaping Use  ? Vaping Use: Never used  ?Substance and Sexual Activity  ? Alcohol use: Not Currently  ?  Comment: occassional wine, no ETOH since 11/2020  ? Drug use: No  ? Sexual activity: Not on file  ?Other Topics Concern  ? Not on file  ?Social History Narrative  ? Not on file  ? ?Social Determinants of Health  ? ?Financial Resource Strain: Not on file  ?Food Insecurity: Not on file  ?Transportation Needs: Not on file  ?Physical Activity: Not on file  ?Stress: Not on file  ?Social Connections: Not on file  ?  ? ?Family History: ?The patient's family history includes Diabetes in her brother, father, and mother. There is no history of Colon cancer, Stomach cancer, or Esophageal cancer. ? ?ROS:   ?Please see the history of present illness.    ? All other systems reviewed and are negative. ? ?EKGs/Labs/Other Studies Reviewed:   ? ?The following studies were reviewed today: ? ? ?EKG:  EKG is  ordered today.  The ekg ordered today demonstrates  ? ? ?07/25/2021-NSR, LAE, poor R wave progression, Qtc 477 ms ? ?Recent Labs: ?08/30/2020: B Natriuretic Peptide 57.4 ?09/01/2020: Magnesium 1.9 ?05/19/2021: ALT 10; BUN 29; Creatinine, Ser 5.68; Hemoglobin  9.5; Platelets 105; Potassium 4.7; Sodium 133  ?Recent Lipid Panel ?No results found for: CHOL, TRIG, HDL, CHOLHDL, VLDL, LDLCALC, LDLDIRECT ? ? ?Risk Assessment/Calculations:   ? ?CHA2DS2-VASc Score = 2  ? This indi

## 2021-08-02 ENCOUNTER — Encounter (HOSPITAL_COMMUNITY): Payer: Self-pay | Admitting: Vascular Surgery

## 2021-08-02 NOTE — Progress Notes (Signed)
Unable to reach patient via phone.  Left a detailed message on machine with instructions for DOS.   ? ?VISITORS ARE ALLOWED TO COME WITH YOU AND STAY IN THE SURGICAL WAITING ROOM ONLY DURING PRE OP AND PROCEDURE DAY OF SURGERY.  ? ?PCP - [name]. PA-C ?Cardiologist - Dr Phineas Inches ?Hematology & Oncology - Dr Jorje Guild ? ?Chest x-ray - 05/19/21 (1V) ?CT Chest - 06/27/21 CE ?EKG - 05/19/21 ?Stress Test - n/a ?ECHO - 06/01/21 ?Cardiac Cath - n/a ? ?ICD Pacemaker/Loop - n/a ? ?Sleep Study -  n/a ?CPAP - none ? ?Blood Thinner Instructions:  Follow your surgeon's instructions on when to stop Eliquis prior to surgery.  Last dose was on 07/31/21. ? ?Anesthesia review: Yes ? ?STOP now taking any Aspirin (unless otherwise instructed by your surgeon), Aleve, Naproxen, Ibuprofen, Motrin, Advil, Goody's, BC's, all herbal medications, fish oil, and all vitamins.  ? ?Coronavirus Screening ?Do you have any of the following symptoms:  ?Cough yes/no: No ?Fever (>100.36F)  yes/no: No ?Runny nose yes/no: No ?Sore throat yes/no: No ?Difficulty breathing/shortness of breath  yes/no: No ? ?Have you traveled in the last 14 days and where? yes/no: No ? ?Patient verbalized understanding of instructions that were given via phone. ?

## 2021-08-02 NOTE — Progress Notes (Signed)
Anesthesia Chart Review: ?Same day workup ? 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 ? ?Patient recently following with cardiology for new onset atrial fibrillation.  Echo 05/2021 showed normal LV function, grade 2 DD, moderately dilated LA.  Last seen by Dr. Phineas Inches 07/25/2021.  Per note, "New Onset Atrial Fibrillation: She has Chads2vasc 2; however prediction in cancer patients is not as accurate than in non cancer patients. [Leader et al. Arterial Thromboembolism in Patients with Atrial Fibrillation Chads2vasc 0 to 2 with and without cancer. Jacc CardioOnc 2023. ]She does have risk of bleeding with cirrhosis/varices, but she denies any hx of significant bleeding. Her hgb is stable. Will cont eliquis and BB. In terms of AAD, amiodarone is not an ideal option with cirrhosis. Dofetilide/sotalol may be options.  If she has bleeding, then risk of bleed is greater than benefit of stroke risk reduction and eliquis can be stopped. Continue rate control strategy."  Metoprolol XL was increased to 25 mg daily.  She was continued on hydralazine 100 mg 3 times daily and Norvasc 10 mg daily.  21-monthfollow-up recommended. ? ?Per instructions from vascular surgery, patient advised to take last dose of Eliquis on 07/31/2021. ? ?CBC 07/02/2021 in Care Everywhere reviewed, stable anemia with hemoglobin 9.9. ? ?Patient will need day of surgery labs and evaluation. ? ?EKG 05/19/2021: Atrial fibrillation with rapid response, ventricular rate 139.  Borderline right axis deviation.  Anteroseptal infarct,  old.  Borderline repolarization abnormality. ? ?TTE 06/01/2021: ? 1. Left ventricular ejection fraction, by estimation, is 60 to 65%. Left  ?ventricular ejection fraction by 3D volume is 60 %. The left ventricle has  ?normal function. The left ventricle has no regional wall motion  ?abnormalities. There is moderate left  ?ventricular hypertrophy. Left ventricular diastolic parameters are  ?consistent with Grade II diastolic dysfunction (pseudonormalization).  ?Elevated left atrial pressure.  ? 2. Right ventricular systolic function is normal. The right ventricular  ?size is normal. Tricuspid regurgitation signal is inadequate for assessing  ?PA pressure.  ? 3. Left atrial size was moderately dilated.  ? 4. The mitral valve is degenerative. Trivial mitral valve regurgitation.  ? 5. The aortic valve is tricuspid. Aortic valve regurgitation is not  ?visualized. Aortic valve sclerosis is present, with no evidence of aortic  ?valve stenosis.  ? 6. The inferior vena cava is normal in size with greater than 50%  ?respiratory variability, suggesting right atrial pressure of 3 mmHg.  ? ? ? ?JKaroline Caldwell PA-C ?[facility]Foundation [facility] AntonioShort Stay Center/Anesthesiology ?Phone ([phone]?08/02/2021 10:47 AM ? ?

## 2021-08-02 NOTE — Anesthesia Preprocedure Evaluation (Deleted)
Anesthesia Evaluation  ? ? ?Airway ? ? ? ? ? ? ? Dental ?  ?Pulmonary ?neg pulmonary ROS, former smoker,  ?  ? ? ? ? ? ? ? Cardiovascular ?hypertension, Pt. on medications and Pt. on home beta blockers ?+ DVT  ?+ dysrhythmias Atrial Fibrillation  ? ? ?  ?Neuro/Psych ? Headaches, Anxiety   ? GI/Hepatic ?Neg liver ROS, GERD  Medicated,  ?Endo/Other  ?negative endocrine ROS ? Renal/GU ?ESRF and DialysisRenal disease  ?negative genitourinary ?  ?Musculoskeletal ?negative musculoskeletal ROS ?(+)  ? Abdominal ?  ?Peds ? Hematology ? ?(+) Blood dyscrasia, anemia ,   ?Anesthesia Other Findings ? ? Reproductive/Obstetrics ? ?  ? ? ? ? ? ? ? ? ? ? ? ? ? ?  ?  ? ? ? ? ? ? ? ?Anesthesia Physical ?Anesthesia Plan ? ?ASA: 3 ? ?Anesthesia Plan: MAC and Regional  ? ?Post-op Pain Management: Regional block*  ? ?Induction: Intravenous ? ?PONV Risk Score and Plan: 2 and Ondansetron, Dexamethasone, Propofol infusion, Midazolam and Treatment may vary due to age or medical condition ? ?Airway Management Planned: Simple Face Mask, Natural Airway and Nasal Cannula ? ?Additional Equipment: None ? ?Intra-op Plan:  ? ?Post-operative Plan:  ? ?Informed Consent:  ? ?Plan Discussed with:  ? 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 ? ?Patient recently following with cardiology for new onset atrial fibrillation.  Echo 05/2021 showed normal LV function, grade 2 DD, moderately dilated LA.  Last seen by Dr. Phineas Inches 07/25/2021.   Per note, "New Onset Atrial Fibrillation: She has Chads2vasc 2; however prediction in cancer patients is not as accurate than in non cancer patients.?(Leader et al. Arterial Thromboembolism in Patients with Atrial Fibrillation Chads2vasc 0 to 2 with and without cancer. Jacc CardioOnc 2023.?)She does have risk of bleeding with cirrhosis/varices, but she denies any hx of significant bleeding. Her hgb is stable. Will?cont eliquis and BB. In terms of AAD, amiodarone is not an ideal option with cirrhosis. Dofetilide/sotalol may be options. ?If she has bleeding, then risk of bleed is greater than benefit of stroke risk reduction and eliquis can be stopped. Continue rate control strategy."  Metoprolol XL was increased to 25 mg daily.  She was continued on hydralazine 100 mg 3 times daily and Norvasc 10 mg daily.  55-monthfollow-up recommended. ? ?Per instructions from vascular surgery, patient advised to take last dose of Eliquis on 07/31/2021. ? ?CBC 07/02/2021 in Care Everywhere reviewed, stable anemia with hemoglobin 9.9. ? ?Patient will need day of surgery labs and evaluation. ? ?EKG 05/19/2021: Atrial fibrillation with rapid response, ventricular rate 139.  Borderline right axis deviation.  Anteroseptal infarct, old.  Borderline repolarization abnormality. ? ?TTE 06/01/2021: ??1. Left ventricular ejection fraction, by estimation, is 60 to 65%. Left  ?ventricular ejection fraction by 3D volume is 60 %. The left ventricle has  ?normal function. The left ventricle has no regional wall motion  ?abnormalities. There is moderate left  ?ventricular hypertrophy. Left ventricular diastolic parameters are  ?consistent with Grade II diastolic dysfunction (pseudonormalization).  ?Elevated left atrial pressure.  ??  2. Right ventricular systolic function is normal. The right ventricular  ?size is normal. Tricuspid regurgitation signal is inadequate for assessing  ?PA pressure.  ??3. Left atrial size was moderately dilated.  ??4. The  mitral valve is degenerative. Trivial mitral valve regurgitation.  ??5. The aortic valve is tricuspid. Aortic valve regurgitation is not  ?visualized. Aortic valve sclerosis is present, with no evidence of aortic  ?valve stenosis.  ??6. The inferior vena cava is normal in size with greater than 50%  ?respiratory variability, suggesting right atrial pressure of 3 mmHg.  ?)  ? ? ? ? ? ?Anesthesia Quick Evaluation ? ?

## 2021-08-03 ENCOUNTER — Encounter (HOSPITAL_COMMUNITY): Payer: Self-pay | Admitting: Vascular Surgery

## 2021-08-03 ENCOUNTER — Ambulatory Visit (HOSPITAL_COMMUNITY)
Admission: RE | Admit: 2021-08-03 | Payer: Medicare (Managed Care) | Source: Home / Self Care | Admitting: Vascular Surgery

## 2021-08-03 HISTORY — DX: Anemia, unspecified: D64.9

## 2021-08-03 SURGERY — TRANSPOSITION, VEIN, BASILIC
Anesthesia: Choice | Laterality: Left

## 2021-08-24 ENCOUNTER — Other Ambulatory Visit: Payer: Self-pay

## 2021-08-24 DIAGNOSIS — N186 End stage renal disease: Secondary | ICD-10-CM

## 2021-08-30 ENCOUNTER — Telehealth: Payer: Self-pay | Admitting: Internal Medicine

## 2021-08-30 NOTE — Telephone Encounter (Signed)
Patient calling the office for samples of medication:   1.  What medication and dosage are you requesting samples for? Eliquis  2.  Are you currently out of this medication?  1 pill left  Pt c/o medication issue:  1. Name of Medication: Eliquis  2. How are you currently taking this medication (dosage and times per day)? 2 times a day  3. Are you having a reaction (difficulty breathing--STAT)?   4. What is your medication issue? Wants to know if she can take Warfarin in the place of Eliquis? She says the Eliquis is so expensive

## 2021-08-31 ENCOUNTER — Telehealth: Payer: Self-pay

## 2021-08-31 NOTE — Telephone Encounter (Signed)
Yes, patient can have Eliquis samples. Is currently on Eliquis 2.'5mg'$  BID. Recommend printing patient assistance paperwork as well as she will likely be approved.    Will also route message to Dr Harl Bowie regarding dosing.    Dr Harl Bowie, patient has been on Eliquis 2.'5mg'$  BID for DVT. However, now that she has new onset A Fib, her dose would actually increase to '5mg'$  BID despite her impaired renal function. I also see she has risk of bleeding with varices.  Would you prefer to continue 2.'5mg'$  or increase to '5mg'$  BID?

## 2021-08-31 NOTE — Telephone Encounter (Signed)
Moved pt's surgery time back to 10am arrival time. She states that she thinks she has a ride to and from by her son. Pt verbalized understanding of pre op instructions. No further questions/concerns at this time.

## 2021-08-31 NOTE — Telephone Encounter (Signed)
Pt called to let us know she has no transportation to/from surgery on any day, as her son just got a new job. She states she has no friends/family or anyone else that can provide transportation. I have spoken to her HD nurse Juliann Pulse at [employer] to let her know the situation and see if they can provide any assistance for pt. Juliann Pulse is going to have their social worker call and speak to her and will f/u with [place] after.

## 2021-08-31 NOTE — Telephone Encounter (Signed)
Sent mychart message back- advising patient we had Eliquis samples here at the office, along with patient assistance forms.

## 2021-09-04 NOTE — Progress Notes (Signed)
Ms Rise Paganini did answer any calls , I left messages for her to call me, she did not.  I left instructions on voice mail.  I instructed the patient to arrive at [facility] entrance at [phone]-  register in the [place]. DO NOT eat or drink anything after midnight.  I instructed the patient to take the following medications in the am with just enough water to get them down: Amlodipine, Gabapentin. I asked patient to shower with antibiotic soap,dry off with a clean towel. Donot wear any lotions, powders, cologne, jewelry, piercing, make-up or nail polish.  Wear clean clothes. Brush teeth. I informed patient that there will need to be a driver and someone to stay with him/her for the first 24 hours after surgery.   I instructed  patient to call [phone], in the am if there were any questions or problems.

## 2021-09-05 ENCOUNTER — Other Ambulatory Visit: Payer: Self-pay

## 2021-09-05 ENCOUNTER — Ambulatory Visit (HOSPITAL_BASED_OUTPATIENT_CLINIC_OR_DEPARTMENT_OTHER): Payer: Medicare (Managed Care)

## 2021-09-05 ENCOUNTER — Ambulatory Visit (HOSPITAL_COMMUNITY)
Admission: RE | Admit: 2021-09-05 | Discharge: 2021-09-05 | Disposition: A | Discharge status: Home / Self Care | Payer: Medicare (Managed Care) | Attending: Vascular Surgery | Admitting: Vascular Surgery

## 2021-09-05 ENCOUNTER — Encounter (HOSPITAL_COMMUNITY): Payer: Self-pay | Admitting: Vascular Surgery

## 2021-09-05 ENCOUNTER — Encounter (HOSPITAL_COMMUNITY)
Admission: RE | Disposition: A | Discharge status: Home / Self Care | Payer: Self-pay | Source: Home / Self Care | Attending: Vascular Surgery

## 2021-09-05 ENCOUNTER — Ambulatory Visit (HOSPITAL_COMMUNITY): Payer: Medicare (Managed Care)

## 2021-09-05 DIAGNOSIS — K7460 Unspecified cirrhosis of liver: Secondary | ICD-10-CM | POA: Diagnosis not present

## 2021-09-05 DIAGNOSIS — N186 End stage renal disease: Secondary | ICD-10-CM | POA: Diagnosis not present

## 2021-09-05 DIAGNOSIS — N185 Chronic kidney disease, stage 5: Secondary | ICD-10-CM | POA: Diagnosis not present

## 2021-09-05 DIAGNOSIS — Z992 Dependence on renal dialysis: Secondary | ICD-10-CM | POA: Diagnosis not present

## 2021-09-05 DIAGNOSIS — D631 Anemia in chronic kidney disease: Secondary | ICD-10-CM

## 2021-09-05 DIAGNOSIS — K219 Gastro-esophageal reflux disease without esophagitis: Secondary | ICD-10-CM | POA: Insufficient documentation

## 2021-09-05 DIAGNOSIS — Z87891 Personal history of nicotine dependence: Secondary | ICD-10-CM | POA: Diagnosis not present

## 2021-09-05 DIAGNOSIS — I120 Hypertensive chronic kidney disease with stage 5 chronic kidney disease or end stage renal disease: Secondary | ICD-10-CM | POA: Diagnosis not present

## 2021-09-05 HISTORY — PX: BASCILIC VEIN TRANSPOSITION: SHX5742

## 2021-09-05 LAB — POCT I-STAT, CHEM 8
BUN: 41 mg/dL — ABNORMAL HIGH (ref 8–23)
Calcium, Ion: 0.97 mmol/L — ABNORMAL LOW (ref 1.15–1.40)
Chloride: 95 mmol/L — ABNORMAL LOW (ref 98–111)
Creatinine, Ser: 6.3 mg/dL — ABNORMAL HIGH (ref 0.44–1.00)
Glucose, Bld: 104 mg/dL — ABNORMAL HIGH (ref 70–99)
HCT: 38 % (ref 36.0–46.0)
Hemoglobin: 12.9 g/dL (ref 12.0–15.0)
Potassium: 4 mmol/L (ref 3.5–5.1)
Sodium: 133 mmol/L — ABNORMAL LOW (ref 135–145)
TCO2: 27 mmol/L (ref 22–32)

## 2021-09-05 SURGERY — TRANSPOSITION, VEIN, BASILIC
Anesthesia: Monitor Anesthesia Care | Site: Arm Upper | Laterality: Left

## 2021-09-05 MED ORDER — PROPOFOL 1000 MG/100ML IV EMUL
INTRAVENOUS | Status: AC
  Filled 2021-09-05: qty 100

## 2021-09-05 MED ORDER — SODIUM CHLORIDE 0.9 % IV SOLN: INTRAVENOUS | Status: DC

## 2021-09-05 MED ORDER — HEPARIN SODIUM (PORCINE) 1000 UNIT/ML IJ SOLN
INTRAMUSCULAR | Status: DC | PRN
  Administered 2021-09-05 (×2): 3000 [IU] via INTRAVENOUS

## 2021-09-05 MED ORDER — ACETAMINOPHEN 10 MG/ML IV SOLN: 1000.0000 mg | Freq: Once | INTRAVENOUS | Status: DC | PRN

## 2021-09-05 MED ORDER — EPHEDRINE 5 MG/ML INJ
INTRAVENOUS | Status: AC
  Filled 2021-09-05: qty 10

## 2021-09-05 MED ORDER — FENTANYL CITRATE (PF) 100 MCG/2ML IJ SOLN: 50.0000 ug | Freq: Once | INTRAMUSCULAR | Status: AC

## 2021-09-05 MED ORDER — MIDAZOLAM HCL 2 MG/2ML IJ SOLN: 1.0000 mg | Freq: Once | INTRAMUSCULAR | Status: AC

## 2021-09-05 MED ORDER — DEXMEDETOMIDINE (PRECEDEX) IN NS 20 MCG/5ML (4 MCG/ML) IV SYRINGE
PREFILLED_SYRINGE | INTRAVENOUS | Status: DC | PRN
  Administered 2021-09-05 (×3): 4 ug via INTRAVENOUS
  Administered 2021-09-05: 8 ug via INTRAVENOUS

## 2021-09-05 MED ORDER — CHLORHEXIDINE GLUCONATE 4 % EX LIQD: 60.0000 mL | Freq: Once | CUTANEOUS | Status: DC

## 2021-09-05 MED ORDER — PHENYLEPHRINE 80 MCG/ML (10ML) SYRINGE FOR IV PUSH (FOR BLOOD PRESSURE SUPPORT)
PREFILLED_SYRINGE | INTRAVENOUS | Status: DC | PRN
  Administered 2021-09-05 (×2): 80 ug via INTRAVENOUS

## 2021-09-05 MED ORDER — ROPIVACAINE HCL 5 MG/ML IJ SOLN
INTRAMUSCULAR | Status: DC | PRN
  Administered 2021-09-05: 30 mL via PERINEURAL

## 2021-09-05 MED ORDER — HEPARIN SODIUM (PORCINE) 1000 UNIT/ML IJ SOLN
INTRAMUSCULAR | Status: AC
  Filled 2021-09-05: qty 10

## 2021-09-05 MED ORDER — CHLORHEXIDINE GLUCONATE 0.12 % MT SOLN
15.0000 mL | Freq: Once | OROMUCOSAL | Status: AC
  Administered 2021-09-05: 15 mL via OROMUCOSAL
  Filled 2021-09-05: qty 15

## 2021-09-05 MED ORDER — PROPOFOL 500 MG/50ML IV EMUL
INTRAVENOUS | Status: DC | PRN
  Administered 2021-09-05: 75 ug/kg/min via INTRAVENOUS

## 2021-09-05 MED ORDER — ORAL CARE MOUTH RINSE: 15.0000 mL | Freq: Once | OROMUCOSAL | Status: AC

## 2021-09-05 MED ORDER — LIDOCAINE HCL (PF) 2 % IJ SOLN
INTRAMUSCULAR | Status: DC | PRN
  Administered 2021-09-05: 200 mg via PERINEURAL

## 2021-09-05 MED ORDER — ONDANSETRON HCL 4 MG/2ML IJ SOLN
INTRAMUSCULAR | Status: DC | PRN
  Administered 2021-09-05: 4 mg via INTRAVENOUS

## 2021-09-05 MED ORDER — HEPARIN 6000 UNIT IRRIGATION SOLUTION
Status: AC
  Filled 2021-09-05: qty 500

## 2021-09-05 MED ORDER — OXYCODONE-ACETAMINOPHEN 5-325 MG PO TABS: 1.0000 | ORAL_TABLET | Freq: Four times a day (QID) | ORAL | 0 refills | Status: DC | PRN

## 2021-09-05 MED ORDER — CEFAZOLIN SODIUM-DEXTROSE 2-4 GM/100ML-% IV SOLN
2.0000 g | INTRAVENOUS | Status: AC
  Administered 2021-09-05: 2 g via INTRAVENOUS
  Filled 2021-09-05: qty 100

## 2021-09-05 MED ORDER — LIDOCAINE 2% (20 MG/ML) 5 ML SYRINGE
INTRAMUSCULAR | Status: DC | PRN
  Administered 2021-09-05: 60 mg via INTRAVENOUS

## 2021-09-05 MED ORDER — FENTANYL CITRATE (PF) 100 MCG/2ML IJ SOLN
INTRAMUSCULAR | Status: AC
  Administered 2021-09-05: 50 ug via INTRAVENOUS
  Filled 2021-09-05: qty 2

## 2021-09-05 MED ORDER — LIDOCAINE 2% (20 MG/ML) 5 ML SYRINGE
INTRAMUSCULAR | Status: AC
  Filled 2021-09-05: qty 5

## 2021-09-05 MED ORDER — HEPARIN 6000 UNIT IRRIGATION SOLUTION
Status: DC | PRN
  Administered 2021-09-05: 1

## 2021-09-05 MED ORDER — PHENYLEPHRINE 80 MCG/ML (10ML) SYRINGE FOR IV PUSH (FOR BLOOD PRESSURE SUPPORT)
PREFILLED_SYRINGE | INTRAVENOUS | Status: AC
  Filled 2021-09-05: qty 10

## 2021-09-05 MED ORDER — FENTANYL CITRATE (PF) 100 MCG/2ML IJ SOLN: 25.0000 ug | INTRAMUSCULAR | Status: DC | PRN

## 2021-09-05 MED ORDER — PHENYLEPHRINE HCL-NACL 20-0.9 MG/250ML-% IV SOLN
INTRAVENOUS | Status: DC | PRN
  Administered 2021-09-05: 20 ug/min via INTRAVENOUS

## 2021-09-05 MED ORDER — MIDAZOLAM HCL 2 MG/2ML IJ SOLN
INTRAMUSCULAR | Status: AC
  Administered 2021-09-05: 1 mg via INTRAVENOUS
  Filled 2021-09-05: qty 2

## 2021-09-05 MED ORDER — PROPOFOL 10 MG/ML IV BOLUS
INTRAVENOUS | Status: DC | PRN
  Administered 2021-09-05 (×4): 10 mg via INTRAVENOUS

## 2021-09-05 MED ORDER — 0.9 % SODIUM CHLORIDE (POUR BTL) OPTIME
TOPICAL | Status: DC | PRN
  Administered 2021-09-05: 1000 mL

## 2021-09-05 SURGICAL SUPPLY — 40 items
ARMBAND PINK RESTRICT EXTREMIT (MISCELLANEOUS) ×2 IMPLANT
BAG COUNTER SPONGE SURGICOUNT (BAG) ×2 IMPLANT
CANISTER SUCT 3000ML PPV (MISCELLANEOUS) ×2 IMPLANT
CLIP VESOCCLUDE MED 24/CT (CLIP) ×2 IMPLANT
CLIP VESOCCLUDE SM WIDE 24/CT (CLIP) ×2 IMPLANT
COVER PROBE W GEL 5X96 (DRAPES) ×2 IMPLANT
DERMABOND ADVANCED (GAUZE/BANDAGES/DRESSINGS) ×1
DERMABOND ADVANCED .7 DNX12 (GAUZE/BANDAGES/DRESSINGS) ×1 IMPLANT
ELECT REM PT RETURN 9FT ADLT (ELECTROSURGICAL) ×2
ELECTRODE REM PT RTRN 9FT ADLT (ELECTROSURGICAL) ×1 IMPLANT
GLOVE BIO SURGEON STRL SZ7.5 (GLOVE) ×2 IMPLANT
GLOVE BIOGEL PI IND STRL 8 (GLOVE) ×1 IMPLANT
GLOVE BIOGEL PI INDICATOR 8 (GLOVE) ×1
GOWN STRL REUS W/ TWL LRG LVL3 (GOWN DISPOSABLE) ×2 IMPLANT
GOWN STRL REUS W/ TWL XL LVL3 (GOWN DISPOSABLE) ×2 IMPLANT
GOWN STRL REUS W/TWL LRG LVL3 (GOWN DISPOSABLE) ×2
GOWN STRL REUS W/TWL XL LVL3 (GOWN DISPOSABLE) ×2
HEMOSTAT SPONGE AVITENE ULTRA (HEMOSTASIS) IMPLANT
KIT BASIN OR (CUSTOM PROCEDURE TRAY) ×2 IMPLANT
KIT TURNOVER KIT B (KITS) ×2 IMPLANT
NDL HYPO 25GX1X1/2 BEV (NEEDLE) ×1 IMPLANT
NEEDLE HYPO 25GX1X1/2 BEV (NEEDLE) ×2 IMPLANT
NS IRRIG 1000ML POUR BTL (IV SOLUTION) ×2 IMPLANT
PACK CV ACCESS (CUSTOM PROCEDURE TRAY) ×2 IMPLANT
PAD ARMBOARD 7.5X6 YLW CONV (MISCELLANEOUS) ×4 IMPLANT
SLING ARM FOAM STRAP LRG (SOFTGOODS) IMPLANT
SLING ARM FOAM STRAP MED (SOFTGOODS) IMPLANT
SPONGE T-LAP 12X12 ~~LOC~~+RFID (SPONGE) ×1 IMPLANT
SUT MNCRL AB 4-0 PS2 18 (SUTURE) ×7 IMPLANT
SUT PROLENE 6 0 BV (SUTURE) ×6 IMPLANT
SUT SILK 2 0 SH (SUTURE) IMPLANT
SUT SILK 3 0 (SUTURE) ×1
SUT SILK 3-0 18XBRD TIE 12 (SUTURE) IMPLANT
SUT VIC AB 2-0 CT1 27 (SUTURE) ×1
SUT VIC AB 2-0 CT1 TAPERPNT 27 (SUTURE) ×1 IMPLANT
SUT VIC AB 3-0 SH 27 (SUTURE) ×4
SUT VIC AB 3-0 SH 27X BRD (SUTURE) ×2 IMPLANT
TOWEL GREEN STERILE (TOWEL DISPOSABLE) ×2 IMPLANT
UNDERPAD 30X36 HEAVY ABSORB (UNDERPADS AND DIAPERS) ×2 IMPLANT
WATER STERILE IRR 1000ML POUR (IV SOLUTION) ×2 IMPLANT

## 2021-09-05 NOTE — Discharge Instructions (Signed)
[facility]  Discharge Instructions  AV Fistula or Graft Surgery for Dialysis Access  Please refer to the following instructions for your post-procedure care. Your surgeon or physician assistant will discuss any changes with you.  Activity  You may drive the day following your surgery, if you are comfortable and no longer taking prescription pain medication. Resume full activity as the soreness in your incision resolves.  Bathing/Showering  You may shower after you go home. Keep your incision dry for 48 hours. Do not soak in a bathtub, hot tub, or swim until the incision heals completely. You may not shower if you have a hemodialysis catheter.  Incision Care  Clean your incision with mild soap and water after 48 hours. Pat the area dry with a clean towel. You do not need a bandage unless otherwise instructed. Do not apply any ointments or creams to your incision. You may have skin glue on your incision. Do not peel it off. It will come off on its own in about one week. Your arm may swell a bit after surgery. To reduce swelling use pillows to elevate your arm so it is above your heart. Your doctor will tell you if you need to lightly wrap your arm with an ACE bandage.  Diet  Resume your normal diet. There are not special food restrictions following this procedure. In order to heal from your surgery, it is CRITICAL to get adequate nutrition. Your body requires vitamins, minerals, and protein. Vegetables are the best source of vitamins and minerals. Vegetables also provide the perfect balance of protein. Processed food has little nutritional value, so try to avoid this.  Medications  Resume taking all of your medications. If your incision is causing pain, you may take over-the counter pain relievers such as acetaminophen (Tylenol). If you were prescribed a stronger pain medication, please be aware these medications can cause nausea and constipation. Prevent  nausea by taking the medication with a snack or meal. Avoid constipation by drinking plenty of fluids and eating foods with high amount of fiber, such as fruits, vegetables, and grains.  Do not take Tylenol if you are taking prescription pain medications.  Follow up Your surgeon may want to see you in the office following your access surgery. If so, this will be arranged at the time of your surgery.  Please call us immediately for any of the following conditions:  Increased pain, redness, drainage (pus) from your incision site Fever of 101 degrees or higher Severe or worsening pain at your incision site Hand pain or numbness.  Reduce your risk of vascular disease:  Stop smoking. If you would like help, call [facility] at 1-800-QUIT-NOW (682)860-8386) or [place] at [place] your cholesterol Maintain a desired weight Control your diabetes Keep your blood pressure down  Dialysis  It will take several weeks to several months for your new dialysis access to be ready for use. Your surgeon will determine when it is okay to use it. Your nephrologist will continue to direct your dialysis. You can continue to use your Permcath until your new access is ready for use.   09/05/2021 Kamiya Acord [id number] 07-17-1955  Surgeon(s): Marty Heck, MD  Procedure(s): SECOND STAGE LEFT BASILIC VEIN TRANSPOSITION   May stick graft immediately   May stick graft on designated area only:   x Do not stick fistula for 8 weeks    If you have any questions, please call the office at [phone].

## 2021-09-05 NOTE — Transfer of Care (Signed)
Immediate Anesthesia Transfer of Care Note  Patient: Alicia [name]  Procedure(s) Performed: SECOND STAGE LEFT BASILIC VEIN TRANSPOSITION (Left: Arm Upper)  Patient Location: PACU  Anesthesia Type:MAC  Level of Consciousness: drowsy and patient cooperative  Airway & Oxygen Therapy: Patient Spontanous Breathing  Post-op Assessment: Report given to RN and Post -op Vital signs reviewed and stable  Post vital signs: Reviewed and stable  Last Vitals:  Vitals Value Taken Time  BP 150/64 09/05/21 1601  Temp 36.6 C 09/05/21 1600  Pulse 74 09/05/21 1605  Resp 21 09/05/21 1605  SpO2 97 % 09/05/21 1605  Vitals shown include unvalidated device data.  Last Pain:  Vitals:   09/05/21 1234  TempSrc:   PainSc: 0-No pain      Patients Stated Pain Goal: 0 (07/37/10 6269)  Complications: No notable events documented.

## 2021-09-05 NOTE — Op Note (Signed)
OPERATIVE NOTE   PROCEDURE: left second stage basilic vein transposition (brachiobasilic arteriovenous fistula) placement  PRE-OPERATIVE DIAGNOSIS: End stage renal disease  POST-OPERATIVE DIAGNOSIS: same  SURGEON: Marty Heck, MD  ASSISTANT(S): Vicente Serene, PA  ANESTHESIA: regional  ESTIMATED BLOOD LOSS: <100 mL  FINDING(S): Three skip incisions were made in the left upper arm and the vein was fully mobilized.  The vein was sclerotic near the antecubital fossa.  It was transposed through a new skin tunnel after it was transected near the antecubital crease.  A new primary end-to-end anastomosis was performed.  We lost a thrill during closure and had to reopen and ultimately redid the anastomosis at the antecubital fossa after removing some of the diseased vein near the antecubital fossa with a much better thrill.  SPECIMEN(S):  None  INDICATIONS:   Alicia [name] is a 66 y.o. female who presents with end stage renal disease and need for permanent hemodialysis access.  The patient is scheduled for left second stage basilic vein transposition.  The patient is aware the risks include but are not limited to: bleeding, infection, steal syndrome, nerve damage, ischemic monomelic neuropathy, failure to mature, and need for additional procedures.  The patient is aware of the risks of the procedure and elects to proceed forward.  An assistant was needed for exposure and to help with the anastomosis.     DESCRIPTION: After full informed written consent was obtained from the patient, the patient was brought back to the operating room and placed supine upon the operating table.  Prior to induction, the patient received IV antibiotics.   After obtaining adequate anesthesia, the patient was then prepped and draped in the standard fashion for a left arm access procedure.  I turned my attention first to identifying the patient's brachiobasilic arteriovenous fistula.  Using SonoSite  guidance, the location of this fistula was marked out on the skin.    This was an excellent caliber vein although did appear diseased around antecubital crease.  I made three longitudinal incisions on the medial aspect of the left upper arm.  Through these incisions I dissected out circumferentially the basilic vein, taking care to protect the nerve.  Once the vein was fully mobilized, all side branches were ligated between silk ties.  The vein was marked for orientation.  I then used a curved tunneler to create a subcutaneous tunnel.  The vein was then transected near the antecubital crease and it was diseased here with intimal hyperplasia. It was then brought to the previously created tunnel making sure to maintain proper orientation.  The patient was given 3,000 units IV heparin.  I was able to pass a #4 dilator proximal and distally.  A primary anastomosis was then performed between the two cut ends of the vein with a running 6-0 Prolene with the help of my assistant.  Once this was done the clamps were released.  There was excellent flow through the fistula.  Hemostasis was then achieved.  The wound was irrigated.  When we started closing the incisions we lost a thrill in the fistula.  The incisions were re-opened and I gave an additional 3,000 units IV heparin.  I recontrolled the fistula and resected the anastmosis near the antecubtital crease.   There was significant intimal hyperplasia here in the diseased segment of vein.  A new end to end anastomosis pwas performed bewtween healthier ends of the vein with 6-0 prolene parachurte technies.  There was an excellent thrill.  Palpable radial  pulse at the wrist.  The incision was closed with a deep layer of 3-0 Vicryl followed by a subcutaneous 4-0 Monocryl and Dermabond.  There were no immediate complications.  COMPLICATIONS: None  CONDITION: Stable  Marty Heck, MD [facility] Office: [place]   09/05/2021, 3:57 PM

## 2021-09-05 NOTE — H&P (Signed)
H&P     MRN #:  [id number]  History of Present Illness: This is a 66 y.o. female with multiple medical comorbidities including end-stage renal disease that presents for left second stage basilic vein transposition.  She had a left brachiobasilic AV fistula on 56/06/1495.  She has no steal symptoms in her left upper extremity.  She is currently using a left IJ tunneled catheter.  Past Medical History:  Diagnosis Date   Anemia    Anxiety    Ascites    Breast cancer ([place])    mets   Cancer ([place])    stomach   Chronic anticoagulation    reported on for DVT prophylaxis, possibly in setting of cancer, but Eliquis d/c'd 08/2016 in setting of anemia/liver disease/acites   Cirrhosis ([place])    CKD (chronic kidney disease), stage III ([place])    Dailysis T-TH-S   Dyspnea    with fluid buildup, no current problems 01/10/21   Esophageal varices ([place]) 11/2016   Small noted on ENDO   GERD (gastroesophageal reflux disease)    Headache    otc med prn   History of blood transfusion 08/2020   Hypertension    Hypotension    Pneumonia    Portal hypertension ([place])     Past Surgical History:  Procedure Laterality Date   ABDOMINAL HYSTERECTOMY     AV FISTULA PLACEMENT Right 09/01/2020   Procedure: RIGHT ARM ARTERIOVENOUS 1st STAGE BASILIC;  Surgeon: Marty Heck, MD;  Location: [place];  Service: Vascular;  Laterality: Right;   AV FISTULA PLACEMENT Left 02/12/2021   Procedure: LEFT ARM FIRST STAGE BASILIC ARTERIOVENOUS (AV) FISTULA CREATION;  Surgeon: Marty Heck, MD;  Location: [place] [facility];  Service: Vascular;  Laterality: Left;   [place] Right 01/12/2021   Procedure: RIGHT ARM SECOND STAGE BASILIC VEIN TRANSPOSITION;  Surgeon: Marty Heck, MD;  Location: [facility];  Service: Vascular;  Laterality: Right;   BREAST LUMPECTOMY     BREAST SURGERY     CESAREAN SECTION     COLONOSCOPY WITH PROPOFOL N/A 05/25/2019   Procedure: COLONOSCOPY WITH PROPOFOL;  Surgeon:  Wilford Corner, MD;  Location: [facility];  Service: Endoscopy;  Laterality: N/A;   ESOPHAGOGASTRODUODENOSCOPY (EGD) WITH PROPOFOL N/A 11/21/2016   Procedure: ESOPHAGOGASTRODUODENOSCOPY;  Surgeon: Milus Banister, MD;  Location: [facility];  Service: Endoscopy;  Laterality: N/A;   ESOPHAGOGASTRODUODENOSCOPY (EGD) WITH PROPOFOL N/A 05/25/2019   Procedure: ESOPHAGOGASTRODUODENOSCOPY (EGD) WITH PROPOFOL;  Surgeon: Wilford Corner, MD;  Location: [facility];  Service: Endoscopy;  Laterality: N/A;   INSERTION OF DIALYSIS CATHETER Left 09/01/2020   Procedure: INSERTION OF A LEFT INTERNAL JUGULAR TUNNELLED DIALYSIS CATHETER. ULTRASOUND GUIDED;  Surgeon: Marty Heck, MD;  Location: [facility];  Service: Vascular;  Laterality: Left;   IR PARACENTESIS  09/04/2016   IR PARACENTESIS  09/20/2016   IR PARACENTESIS  10/22/2016   IR PARACENTESIS  01/27/2017   IR PARACENTESIS  04/11/2017   IR PARACENTESIS  06/05/2017   IR PARACENTESIS  06/05/2017   IR PARACENTESIS  06/19/2017   IR PARACENTESIS  07/03/2017   IR PARACENTESIS  07/17/2017   IR PARACENTESIS  08/01/2017   IR PARACENTESIS  08/14/2017   IR PARACENTESIS  08/28/2017   IR PARACENTESIS  09/11/2017   IR PARACENTESIS  09/25/2017   IR PARACENTESIS  10/08/2017   IR PARACENTESIS  10/23/2017   IR PARACENTESIS  11/06/2017   IR PARACENTESIS  11/20/2017   IR PARACENTESIS  12/04/2017   IR PARACENTESIS  12/18/2017  IR PARACENTESIS  01/19/2018   IR PARACENTESIS  02/09/2018   IR PARACENTESIS  02/24/2018   IR PARACENTESIS  03/16/2018   IR PARACENTESIS  04/10/2018   IR PARACENTESIS  05/08/2018   IR PARACENTESIS  06/11/2018   IR PARACENTESIS  07/07/2018   IR PARACENTESIS  09/04/2018   IR PARACENTESIS  10/22/2018   IR PARACENTESIS  11/30/2018   IR PARACENTESIS  01/14/2019   IR PARACENTESIS  04/30/2019   IR PARACENTESIS  07/19/2019   IR PARACENTESIS  09/05/2020   IR RADIOLOGIST EVAL & MGMT  12/23/2017   LYMPH NODE DISSECTION      ULTRASOUND GUIDANCE FOR VASCULAR ACCESS Left 09/01/2020   Procedure: ULTRASOUND GUIDANCE FOR VASCULAR ACCESS;  Surgeon: Marty Heck, MD;  Location: [place];  Service: Vascular;  Laterality: Left;    Allergies  Allergen Reactions   Penicillins Other (See Comments)    Childhood reaction   Antivert [Meclizine] Anxiety   Compazine [Prochlorperazine Edisylate] Anxiety   Flexeril [Cyclobenzaprine] Anxiety    Prior to Admission medications   Medication Sig Start Date End Date Taking? Authorizing Provider  acetaminophen (TYLENOL) 500 MG tablet Take 1,000 mg by mouth every 6 (six) hours as needed for moderate pain or headache.   Yes [provider]  amLODipine (NORVASC) 10 MG tablet Take 10 mg by mouth every evening. 05/14/20  Yes [provider]  apixaban (ELIQUIS) 2.5 MG TABS tablet Take 1 tablet (2.5 mg total) by mouth 2 (two) times daily. 05/21/21  Yes Janina Mayo, MD  calcitRIOL (ROCALTROL) 0.5 MCG capsule Take 0.5 mcg by mouth Every Tuesday,Thursday,and Saturday with dialysis.   Yes [provider]  Calcium Carb-Cholecalciferol (CALCIUM 600 + D PO) Take 1 tablet by mouth every other day.   Yes [provider]  calcium carbonate (TUMS - DOSED IN MG ELEMENTAL CALCIUM) 500 MG chewable tablet Chew 3 tablets by mouth 3 (three) times daily with meals.   Yes [provider]  furosemide (LASIX) 40 MG tablet Take 40 mg by mouth every evening.   Yes [provider]  gabapentin (NEURONTIN) 100 MG capsule Take 100 mg by mouth 2 (two) times daily as needed for itching. 05/23/21  Yes [provider]  hydrALAZINE (APRESOLINE) 100 MG tablet Take 100 mg by mouth 2 (two) times daily. 05/16/20  Yes [provider]  metoprolol succinate (TOPROL-XL) 25 MG 24 hr tablet Take 1 tablet (25 mg total) by mouth daily. Take with or immediately following a meal. Patient taking differently: Take 25 mg by mouth at bedtime. Take with or immediately  following a meal. 07/25/21  Yes Branch, Royetta Crochet, MD  spironolactone (ALDACTONE) 25 MG tablet Take 25 mg by mouth every evening. 08/29/20  Yes [provider]  aspirin-acetaminophen-caffeine (EXCEDRIN MIGRAINE) 250-250-65 MG tablet Take 2 tablets by mouth every 6 (six) hours as needed for headache.    [provider]  loperamide (IMODIUM) 2 MG capsule Take 4 mg by mouth as needed for diarrhea or loose stools.    [provider]  omeprazole (PRILOSEC) 20 MG capsule Take 20 mg by mouth daily as needed (acid reflux.).    [provider]  oxyCODONE-acetaminophen (PERCOCET) 5-325 MG tablet Take 1 tablet by mouth every 6 (six) hours as needed for severe pain. 02/19/21 02/19/22  Baglia, Corrina, PA-C  Phenylephrine-APAP-guaiFENesin (SINUS CONGESTION/PAIN DAYTIME) 5-325-200 MG TABS Take 1 tablet by mouth daily as needed (sinus issues.).    [provider]  sodium chloride (OCEAN) 0.65 %  SOLN nasal spray Place 1 spray into both nostrils as needed for congestion.    [provider]    Social History   Socioeconomic History   Marital status: Single    Spouse name: Not on file   Number of children: Not on file   Years of education: Not on file   Highest education level: Not on file  Occupational History   Not on file  Tobacco Use   Smoking status: Former    Types: Cigars   Smokeless tobacco: Never   Tobacco comments:    Cigar 2-3 times a year   Vaping Use   Vaping Use: Never used  Substance and Sexual Activity   Alcohol use: Not Currently    Comment: occassional wine, no ETOH since 11/2020   Drug use: No   Sexual activity: Not on file    Comment: Hysterectomy  Other Topics Concern   Not on file  Social History Narrative   Not on file   Social Determinants of Health   Financial Resource Strain: Not on file  Food Insecurity: Not on file  Transportation Needs: Not on file  Physical Activity: Not on file  Stress: Not on file  Social  Connections: Not on file  Intimate Partner Violence: Not on file     Family History  Problem Relation Age of Onset   Diabetes Mother    Diabetes Father    Diabetes Brother    Colon cancer Neg Hx    Stomach cancer Neg Hx    Esophageal cancer Neg Hx     ROS: '[x]'$  Positive   '[ ]'$  Negative   '[ ]'$  All sytems reviewed and are negative  Cardiovascular: '[]'$  chest pain/pressure '[]'$  palpitations '[]'$  SOB lying flat '[]'$  DOE '[]'$  pain in legs while walking '[]'$  pain in legs at rest '[]'$  pain in legs at night '[]'$  non-healing ulcers '[]'$  hx of DVT '[]'$  swelling in legs  Pulmonary: '[]'$  productive cough '[]'$  asthma/wheezing '[]'$  home O2  Neurologic: '[]'$  weakness in '[]'$  arms '[]'$  legs '[]'$  numbness in '[]'$  arms '[]'$  legs '[]'$  hx of CVA '[]'$  mini stroke '[]'$ difficulty speaking or slurred speech '[]'$  temporary loss of vision in one eye '[]'$  dizziness  Hematologic: '[]'$  hx of cancer '[]'$  bleeding problems '[]'$  problems with blood clotting easily  Endocrine:   '[]'$  diabetes '[]'$  thyroid disease  GI '[]'$  vomiting blood '[]'$  blood in stool  GU: '[]'$  CKD/renal failure '[]'$  HD--'[]'$  M/W/F or '[]'$  T/T/S '[]'$  burning with urination '[]'$  blood in urine  Psychiatric: '[]'$  anxiety '[]'$  depression  Musculoskeletal: '[]'$  arthritis '[]'$  joint pain  Integumentary: '[]'$  rashes '[]'$  ulcers  Constitutional: '[]'$  fever '[]'$  chills   Physical Examination  Vitals:   09/05/21 1010  BP: (!) 146/76  Pulse: 91  Resp: 18  Temp: 98.3 F (36.8 C)  SpO2: 100%   Body mass index is 33.2 kg/m.  General:  WDWN in NAD Gait: Not observed HENT: WNL, normocephalic Pulmonary: normal non-labored breathing, without Rales, rhonchi,  wheezing Cardiac: regular, without  Murmurs, rubs or gallops Abdomen: soft, NT/ND Vascular Exam/Pulses: Left brachiobasilic fistula with good thrill Left radial pulse palpable Left IJ tunnel catheter  CBC    Component Value Date/Time   WBC 7.8 05/19/2021 0229   RBC 3.52 (L) 05/19/2021 0229   HGB 12.9 09/05/2021 1040   HCT 38.0  09/05/2021 1040   PLT 105 (L) 05/19/2021 0229   MCV 87.5 05/19/2021 0229   MCH 27.0 05/19/2021 0229   MCHC 30.8 05/19/2021 0229  RDW 16.2 (H) 05/19/2021 0229   LYMPHSABS 0.4 (L) 05/19/2021 0229   MONOABS 0.6 05/19/2021 0229   EOSABS 0.0 05/19/2021 0229   BASOSABS 0.0 05/19/2021 0229    BMET    Component Value Date/Time   NA 133 (L) 09/05/2021 1040   K 4.0 09/05/2021 1040   CL 95 (L) 09/05/2021 1040   CO2 22 05/19/2021 0229   GLUCOSE 104 (H) 09/05/2021 1040   BUN 41 (H) 09/05/2021 1040   CREATININE 6.30 (H) 09/05/2021 1040   CALCIUM 8.5 (L) 05/19/2021 0229   GFRNONAA 8 (L) 05/19/2021 0229   GFRAA 36 (L) 11/08/2017 0536    COAGS: Lab Results  Component Value Date   INR 1.1 09/01/2020   INR 1.0 12/30/2018   INR 1.14 11/06/2017     Non-Invasive Vascular Imaging:    N/A  ASSESSMENT/PLAN: This is a 66 y.o. female with end-stage renal disease that presents for left second stage basilic vein transposition.  Still has an excellent thrill after first stage basilic vein fistula in the left arm.  Palpable radial pulse.  No signs of steal.  Risks benefits discussed.  Marty Heck, MD [facility] Office: [place]

## 2021-09-05 NOTE — Anesthesia Preprocedure Evaluation (Addendum)
Anesthesia Evaluation  Patient identified by MRN, date of birth, ID band Patient awake    Reviewed: Allergy & Precautions, NPO status , Patient's Chart, lab work & pertinent test results  Airway Mallampati: II  TM Distance: >3 FB Neck ROM: Full    Dental no notable dental hx.    Pulmonary neg pulmonary ROS, former smoker,    Pulmonary exam normal        Cardiovascular hypertension, Pt. on medications and Pt. on home beta blockers + DVT   Rhythm:Regular Rate:Normal     Neuro/Psych  Headaches, Anxiety    GI/Hepatic GERD  Medicated,(+) Cirrhosis       ,   Endo/Other  negative endocrine ROS  Renal/GU ESRF and DialysisRenal disease  negative genitourinary   Musculoskeletal negative musculoskeletal ROS (+)   Abdominal Normal abdominal exam  (+)   Peds  Hematology  (+) Blood dyscrasia, anemia ,   Anesthesia Other Findings   Reproductive/Obstetrics                             Anesthesia Physical Anesthesia Plan  ASA: 3  Anesthesia Plan: MAC and Regional   Post-op Pain Management: Regional block*   Induction: Intravenous  PONV Risk Score and Plan: 2 and Ondansetron, Dexamethasone, Midazolam and Treatment may vary due to age or medical condition  Airway Management Planned: Simple Face Mask, Natural Airway and Nasal Cannula  Additional Equipment: None  Intra-op Plan:   Post-operative Plan:   Informed Consent: I have reviewed the patients History and Physical, chart, labs and discussed the procedure including the risks, benefits and alternatives for the proposed anesthesia with the patient or authorized representative who has indicated his/her understanding and acceptance.     Dental advisory given  Plan Discussed with: CRNA  Anesthesia Plan Comments:         Anesthesia Quick Evaluation

## 2021-09-05 NOTE — Anesthesia Procedure Notes (Signed)
Anesthesia Regional Block: Supraclavicular block   Pre-Anesthetic Checklist: , timeout performed,  Correct Patient, Correct Site, Correct Laterality,  Correct Procedure, Correct Position, site marked,  Risks and benefits discussed,  Surgical consent,  Pre-op evaluation,  At surgeon's request and post-op pain management  Laterality: Left  Prep: Dura Prep       Needles:  Injection technique: Single-shot  Needle Type: Echogenic Stimulator Needle     Needle Length: 5cm  Needle Gauge: 20     Additional Needles:   Procedures:,,,, ultrasound used (permanent image in chart),,    Narrative:  Start time: 09/05/2021 12:15 PM End time: 09/05/2021 12:20 PM Injection made incrementally with aspirations every 5 mL.  Performed by: Personally  Anesthesiologist: Darral Dash, DO  Additional Notes: Patient identified. Risks/Benefits/Options discussed with patient including but not limited to bleeding, infection, nerve damage, failed block, incomplete pain control. Patient expressed understanding and wished to proceed. All questions were answered. Sterile technique was used throughout the entire procedure. Please see nursing notes for vital signs. Aspirated in 5cc intervals with injection for negative confirmation. Patient was given instructions on fall risk and not to get out of bed. All questions and concerns addressed with instructions to call with any issues or inadequate analgesia.

## 2021-09-05 NOTE — Anesthesia Postprocedure Evaluation (Signed)
Anesthesia Post Note  Patient: Alicia [name]  Procedure(s) Performed: SECOND STAGE LEFT BASILIC VEIN TRANSPOSITION (Left: Arm Upper)     Patient location during evaluation: PACU Anesthesia Type: Regional and MAC Level of consciousness: awake and alert Pain management: pain level controlled Vital Signs Assessment: post-procedure vital signs reviewed and stable Respiratory status: spontaneous breathing, nonlabored ventilation, respiratory function stable and patient connected to nasal cannula oxygen Cardiovascular status: stable and blood pressure returned to baseline Postop Assessment: no apparent nausea or vomiting Anesthetic complications: no   No notable events documented.  Last Vitals:  Vitals:   09/05/21 1615 09/05/21 1630  BP: 138/60 (!) 148/72  Pulse: 70 66  Resp: 13 15  Temp:    SpO2: 98% 99%    Last Pain:  Vitals:   09/05/21 1630  TempSrc:   PainSc: 0-No pain                 [name]

## 2021-09-06 ENCOUNTER — Telehealth: Payer: Self-pay | Admitting: *Deleted

## 2021-09-06 ENCOUNTER — Encounter (HOSPITAL_COMMUNITY): Payer: Self-pay | Admitting: Vascular Surgery

## 2021-09-06 NOTE — Telephone Encounter (Signed)
Patient called stating she was having mild bleeding from her AVF surgical site.  She had applied a band-aid.  I instructed if bleeding worsens to apply 4x4 and elevate.  If continues apply an ace wrap lightly over the 4x4.  Patient voiced understanding of the instructions.

## 2021-10-02 NOTE — Progress Notes (Deleted)
POST OPERATIVE OFFICE NOTE    CC:  F/u for surgery  HPI:  This is a 66 y.o. female who is s/p left 2nd stage BVT on 5/31/202 by Dr. Carlis Abbott.  The first stage was done on 02/12/2021 also by Dr. Carlis Abbott.   She does have hx of right BVT.      Past medical history:recurrent metastatic breast cancer, hypertension, alcoholic cirrhosis with recurrent ascites needing paracentesis every 2 weeks, portal hypertension, anemia of chronic disease, gastroesophageal reflux disease and chronic kidney disease stage V (creatinine 5.6-6.3 at baseline).   Pt states she does *** have pain/numbness in *** hand.    The pt *** on dialysis *** at *** location.   Allergies  Allergen Reactions   Penicillins Other (See Comments)    Childhood reaction   Antivert [Meclizine] Anxiety   Compazine [Prochlorperazine Edisylate] Anxiety   Flexeril [Cyclobenzaprine] Anxiety    Current Outpatient Medications  Medication Sig Dispense Refill   acetaminophen (TYLENOL) 500 MG tablet Take 1,000 mg by mouth every 6 (six) hours as needed for moderate pain or headache.     amLODipine (NORVASC) 10 MG tablet Take 10 mg by mouth every evening.     apixaban (ELIQUIS) 2.5 MG TABS tablet Take 1 tablet (2.5 mg total) by mouth 2 (two) times daily. 60 tablet 3   aspirin-acetaminophen-caffeine (EXCEDRIN MIGRAINE) 250-250-65 MG tablet Take 2 tablets by mouth every 6 (six) hours as needed for headache.     calcitRIOL (ROCALTROL) 0.5 MCG capsule Take 0.5 mcg by mouth Every Tuesday,Thursday,and Saturday with dialysis.     Calcium Carb-Cholecalciferol (CALCIUM 600 + D PO) Take 1 tablet by mouth every other day.     calcium carbonate (TUMS - DOSED IN MG ELEMENTAL CALCIUM) 500 MG chewable tablet Chew 3 tablets by mouth 3 (three) times daily with meals.     furosemide (LASIX) 40 MG tablet Take 40 mg by mouth every evening.     gabapentin (NEURONTIN) 100 MG capsule Take 100 mg by mouth 2 (two) times daily as needed for itching.     hydrALAZINE  (APRESOLINE) 100 MG tablet Take 100 mg by mouth 2 (two) times daily.     loperamide (IMODIUM) 2 MG capsule Take 4 mg by mouth as needed for diarrhea or loose stools.     metoprolol succinate (TOPROL-XL) 25 MG 24 hr tablet Take 1 tablet (25 mg total) by mouth daily. Take with or immediately following a meal. (Patient taking differently: Take 25 mg by mouth at bedtime. Take with or immediately following a meal.) 90 tablet 3   omeprazole (PRILOSEC) 20 MG capsule Take 20 mg by mouth daily as needed (acid reflux.).     oxyCODONE-acetaminophen (PERCOCET) 5-325 MG tablet Take 1 tablet by mouth every 6 (six) hours as needed for severe pain. 20 tablet 0   Phenylephrine-APAP-guaiFENesin (SINUS CONGESTION/PAIN DAYTIME) 5-325-200 MG TABS Take 1 tablet by mouth daily as needed (sinus issues.).     sodium chloride (OCEAN) 0.65 % SOLN nasal spray Place 1 spray into both nostrils as needed for congestion.     spironolactone (ALDACTONE) 25 MG tablet Take 25 mg by mouth every evening.     No current facility-administered medications for this visit.     ROS:  See HPI  Physical Exam:  ***  Incision:  *** Extremities:   There *** a palpable *** pulse.   Motor and sensory *** in tact.   There *** a thrill/bruit present.  The fistula/graft *** easily palpable  Assessment/Plan:  This is a 66 y.o. female who is s/p: left 2nd stage BVT on 5/31/202 by Dr. Carlis Abbott.  The first stage was done on 02/12/2021 also by Dr. Carlis Abbott.   -the pt does *** have evidence of steal. -the fistula/graft can be used ***. -if pt has tunneled catheter, this can be removed at the discretion of the dialysis center once the fistula/graft has been accessed to their satisfaction.  -discussed with pt that access does not last forever and will need intervention or even new access at some point.  -the pt will follow up ***   Leontine Locket, [facility] [phone]  Clinic MD:  Donzetta Matters

## 2021-10-04 ENCOUNTER — Other Ambulatory Visit: Payer: Self-pay | Admitting: Internal Medicine

## 2021-10-04 NOTE — Telephone Encounter (Signed)
*  STAT* If patient is at the pharmacy, call can be transferred to refill team.   1. Which medications need to be refilled? (please list name of each medication and dose if known)   apixaban (ELIQUIS) 2.5 MG TABS tablet  2. Which pharmacy/location (including street and city if local pharmacy) is medication to be sent to?  [place], [place]  3. Do they need a 30 day or 90 day supply?   Patient only has 6 tablets left.    Patient states she would like samples mailed to her as she cannot afford the medication and no longer has transportation to pick up the samples.  She would also need paperwork for medication assistance mailed to her.

## 2021-10-05 NOTE — Telephone Encounter (Addendum)
Eliquis 2.'5mg'$  refill request received. Patient is 66 years old, weight-77.1kg, Crea-6.30 on 09/05/2021, Diagnosis-DVT in past & NEW AFIB, and last seen by Dr. Phineas Inches on 07/25/2021 and 2.'5mg'$  BID Eliquis dose prescribed for the pt at that time per her note. The dose is correct per Dr. Nelly Laurence OV note.   Pt is requesting samples; correct dose per last OV note.   Called the pt and advised we can not send samples in the mail and she stated she would find a ride to get samples. Also, advised that the she really needs to fill out the patient assistance paperwork and she stated she knows but she lost the other ones. Advised that we can send in a refill as well and she stated she can't afford it and that is the reason she needs the samples. Advised we cn leave samples at the from desk like the last time but she has to fill out the proper paper work for the assistance program. She stated she would do so and stated she would call [employer] for a ride on Monday to come pick it up.

## 2021-10-10 ENCOUNTER — Encounter (HOSPITAL_COMMUNITY): Payer: Self-pay | Admitting: *Deleted

## 2021-10-12 ENCOUNTER — Ambulatory Visit (INDEPENDENT_AMBULATORY_CARE_PROVIDER_SITE_OTHER): Payer: Medicare (Managed Care) | Admitting: Physician Assistant

## 2021-10-12 VITALS — BP 119/70 | HR 82 | Temp 97.8°F | Resp 14 | Ht 60.0 in | Wt 165.0 lb

## 2021-10-12 DIAGNOSIS — N186 End stage renal disease: Secondary | ICD-10-CM

## 2021-10-12 DIAGNOSIS — Z992 Dependence on renal dialysis: Secondary | ICD-10-CM

## 2021-10-12 NOTE — Progress Notes (Signed)
POST OPERATIVE OFFICE NOTE    CC:  F/u for surgery  HPI:  This is a 66 y.o. female who is s/p L 2nd stage basilic vein fistula transposition on 09/05/2021 by Dr. Carlis Abbott. The patient currently dialyzes on T, Th, Sat at [facility] dialysis center in [place]. She has been using her L internal jugular TDC.    Pt returns today for follow up.  Pt states everything has gone well since surgery. She states sometimes her L arm is sore after using it a lot. She denies any steal symptoms in the left hand such as pain, coldness, numbness, or reduced grip strength.   Allergies  Allergen Reactions   Penicillins Other (See Comments)    Childhood reaction   Antivert [Meclizine] Anxiety   Compazine [Prochlorperazine Edisylate] Anxiety   Flexeril [Cyclobenzaprine] Anxiety    Current Outpatient Medications  Medication Sig Dispense Refill   acetaminophen (TYLENOL) 500 MG tablet Take 1,000 mg by mouth every 6 (six) hours as needed for moderate pain or headache.     amLODipine (NORVASC) 10 MG tablet Take 10 mg by mouth every evening.     apixaban (ELIQUIS) 2.5 MG TABS tablet Take 1 tablet (2.5 mg total) by mouth 2 (two) times daily. 60 tablet 3   aspirin-acetaminophen-caffeine (EXCEDRIN MIGRAINE) 250-250-65 MG tablet Take 2 tablets by mouth every 6 (six) hours as needed for headache.     calcitRIOL (ROCALTROL) 0.5 MCG capsule Take 0.5 mcg by mouth Every Tuesday,Thursday,and Saturday with dialysis.     Calcium Carb-Cholecalciferol (CALCIUM 600 + D PO) Take 1 tablet by mouth every other day.     calcium carbonate (TUMS - DOSED IN MG ELEMENTAL CALCIUM) 500 MG chewable tablet Chew 3 tablets by mouth 3 (three) times daily with meals.     furosemide (LASIX) 40 MG tablet Take 40 mg by mouth every evening.     gabapentin (NEURONTIN) 100 MG capsule Take 100 mg by mouth 2 (two) times daily as needed for itching.     hydrALAZINE (APRESOLINE) 100 MG tablet Take 100 mg by mouth 2 (two) times daily.     loperamide  (IMODIUM) 2 MG capsule Take 4 mg by mouth as needed for diarrhea or loose stools.     metoprolol succinate (TOPROL-XL) 25 MG 24 hr tablet Take 1 tablet (25 mg total) by mouth daily. Take with or immediately following a meal. (Patient taking differently: Take 25 mg by mouth at bedtime. Take with or immediately following a meal.) 90 tablet 3   omeprazole (PRILOSEC) 20 MG capsule Take 20 mg by mouth daily as needed (acid reflux.).     oxyCODONE-acetaminophen (PERCOCET) 5-325 MG tablet Take 1 tablet by mouth every 6 (six) hours as needed for severe pain. 20 tablet 0   Phenylephrine-APAP-guaiFENesin (SINUS CONGESTION/PAIN DAYTIME) 5-325-200 MG TABS Take 1 tablet by mouth daily as needed (sinus issues.).     sodium chloride (OCEAN) 0.65 % SOLN nasal spray Place 1 spray into both nostrils as needed for congestion.     spironolactone (ALDACTONE) 25 MG tablet Take 25 mg by mouth every evening.     No current facility-administered medications for this visit.     ROS:  See HPI  Physical Exam:   Incision:  L upper arm incisions well healed Extremities:  Palpable L radial pulse 2+. Bilateral grip strength equal Neuro: intact. Alert and oriented x3   Assessment/Plan:  This is a 66 y.o. female who is s/p: L 2nd stage basilic vein fistula transposition on  09/05/2021 by [facility]   - The patient is 5 weeks out from 2nd stage basilic transposition. The fistula is well matured with strong thrill and good flow on auscultation. - All upper arm incision sites are well healed with no erythema, dehiscence, or drainage - Patient exhibits no steal symptoms and she has a palpable 2+ radial pulse - Fistula is ready for use as soon as 10/16/2021 - Patient can follow up with our office as needed   Vicente Serene, PA-C [facility] [phone]   Clinic MD:  Virl Cagey

## 2021-10-13 ENCOUNTER — Other Ambulatory Visit: Payer: Self-pay

## 2021-10-13 ENCOUNTER — Encounter (HOSPITAL_BASED_OUTPATIENT_CLINIC_OR_DEPARTMENT_OTHER): Payer: Self-pay

## 2021-10-13 ENCOUNTER — Emergency Department (HOSPITAL_BASED_OUTPATIENT_CLINIC_OR_DEPARTMENT_OTHER): Payer: Medicare (Managed Care) | Admitting: Radiology

## 2021-10-13 ENCOUNTER — Emergency Department (HOSPITAL_BASED_OUTPATIENT_CLINIC_OR_DEPARTMENT_OTHER)
Admission: EM | Admit: 2021-10-13 | Discharge: 2021-10-13 | Disposition: A | Discharge status: Home / Self Care | Payer: Medicare (Managed Care) | Attending: Emergency Medicine | Admitting: Emergency Medicine

## 2021-10-13 DIAGNOSIS — Z853 Personal history of malignant neoplasm of breast: Secondary | ICD-10-CM | POA: Insufficient documentation

## 2021-10-13 DIAGNOSIS — Z992 Dependence on renal dialysis: Secondary | ICD-10-CM | POA: Insufficient documentation

## 2021-10-13 DIAGNOSIS — Z85028 Personal history of other malignant neoplasm of stomach: Secondary | ICD-10-CM | POA: Insufficient documentation

## 2021-10-13 DIAGNOSIS — I129 Hypertensive chronic kidney disease with stage 1 through stage 4 chronic kidney disease, or unspecified chronic kidney disease: Secondary | ICD-10-CM | POA: Diagnosis not present

## 2021-10-13 DIAGNOSIS — N183 Chronic kidney disease, stage 3 unspecified: Secondary | ICD-10-CM | POA: Insufficient documentation

## 2021-10-13 DIAGNOSIS — Z1159 Encounter for screening for other viral diseases: Secondary | ICD-10-CM | POA: Diagnosis not present

## 2021-10-13 DIAGNOSIS — Z87891 Personal history of nicotine dependence: Secondary | ICD-10-CM | POA: Diagnosis not present

## 2021-10-13 DIAGNOSIS — M1711 Unilateral primary osteoarthritis, right knee: Secondary | ICD-10-CM | POA: Insufficient documentation

## 2021-10-13 DIAGNOSIS — Z7901 Long term (current) use of anticoagulants: Secondary | ICD-10-CM | POA: Insufficient documentation

## 2021-10-13 DIAGNOSIS — M25461 Effusion, right knee: Secondary | ICD-10-CM | POA: Insufficient documentation

## 2021-10-13 DIAGNOSIS — Z79899 Other long term (current) drug therapy: Secondary | ICD-10-CM | POA: Diagnosis not present

## 2021-10-13 DIAGNOSIS — M25561 Pain in right knee: Secondary | ICD-10-CM | POA: Diagnosis present

## 2021-10-13 DIAGNOSIS — Z7982 Long term (current) use of aspirin: Secondary | ICD-10-CM | POA: Insufficient documentation

## 2021-10-13 LAB — BASIC METABOLIC PANEL
Anion gap: 13 (ref 5–15)
BUN: 35 mg/dL — ABNORMAL HIGH (ref 8–23)
CO2: 24 mmol/L (ref 22–32)
Calcium: 9.3 mg/dL (ref 8.9–10.3)
Chloride: 94 mmol/L — ABNORMAL LOW (ref 98–111)
Creatinine, Ser: 6.46 mg/dL — ABNORMAL HIGH (ref 0.44–1.00)
GFR, Estimated: 7 mL/min — ABNORMAL LOW (ref >60)
Glucose, Bld: 138 mg/dL — ABNORMAL HIGH (ref 70–99)
Potassium: 4 mmol/L (ref 3.5–5.1)
Sodium: 131 mmol/L — ABNORMAL LOW (ref 135–145)

## 2021-10-13 LAB — CBC WITH DIFFERENTIAL/PLATELET
Abs Immature Granulocytes: 0.03 10*3/uL (ref 0.00–0.07)
Basophils Absolute: 0 10*3/uL (ref 0.0–0.1)
Basophils Relative: 0 %
Eosinophils Absolute: 0 10*3/uL (ref 0.0–0.5)
Eosinophils Relative: 0 %
HCT: 36.8 % (ref 36.0–46.0)
Hemoglobin: 11.2 g/dL — ABNORMAL LOW (ref 12.0–15.0)
Immature Granulocytes: 1 %
Lymphocytes Relative: 7 %
Lymphs Abs: 0.4 10*3/uL — ABNORMAL LOW (ref 0.7–4.0)
MCH: 28.4 pg (ref 26.0–34.0)
MCHC: 30.4 g/dL (ref 30.0–36.0)
MCV: 93.4 fL (ref 80.0–100.0)
Monocytes Absolute: 0.2 10*3/uL (ref 0.1–1.0)
Monocytes Relative: 3 %
Neutro Abs: 4.8 10*3/uL (ref 1.7–7.7)
Neutrophils Relative %: 89 %
Platelets: 89 10*3/uL — ABNORMAL LOW (ref 150–400)
RBC: 3.94 MIL/uL (ref 3.87–5.11)
RDW: 15 % (ref 11.5–15.5)
WBC: 5.3 10*3/uL (ref 4.0–10.5)
nRBC: 0 % (ref 0.0–0.2)

## 2021-10-13 LAB — HEPATITIS PANEL, ACUTE
HCV Ab: NONREACTIVE
Hep A IgM: NONREACTIVE
Hep B C IgM: NONREACTIVE
Hepatitis B Surface Ag: NONREACTIVE

## 2021-10-13 LAB — SYNOVIAL CELL COUNT + DIFF, W/ CRYSTALS
Crystals, Fluid: NONE SEEN
Eosinophils-Synovial: 0 % (ref 0–1)
Lymphocytes-Synovial Fld: 1 % (ref 0–20)
Monocyte-Macrophage-Synovial Fluid: 6 % — ABNORMAL LOW (ref 50–90)
Neutrophil, Synovial: 93 % — ABNORMAL HIGH (ref 0–25)
WBC, Synovial: 8100 /mm3 — ABNORMAL HIGH (ref 0–200)

## 2021-10-13 MED ORDER — APIXABAN 2.5 MG PO TABS
2.5000 mg | ORAL_TABLET | Freq: Two times a day (BID) | ORAL | Status: DC
  Administered 2021-10-13: 2.5 mg via ORAL
  Filled 2021-10-13: qty 1

## 2021-10-13 MED ORDER — SPIRONOLACTONE 25 MG PO TABS
25.0000 mg | ORAL_TABLET | Freq: Every evening | ORAL | Status: DC
  Administered 2021-10-13: 25 mg via ORAL
  Filled 2021-10-13: qty 1

## 2021-10-13 MED ORDER — FUROSEMIDE 20 MG PO TABS
40.0000 mg | ORAL_TABLET | Freq: Every evening | ORAL | Status: DC
  Administered 2021-10-13: 40 mg via ORAL
  Filled 2021-10-13: qty 2

## 2021-10-13 MED ORDER — HYDROMORPHONE HCL 1 MG/ML IJ SOLN
1.0000 mg | Freq: Once | INTRAMUSCULAR | Status: AC
  Administered 2021-10-13: 1 mg via INTRAMUSCULAR
  Filled 2021-10-13: qty 1

## 2021-10-13 MED ORDER — AMLODIPINE BESYLATE 5 MG PO TABS
10.0000 mg | ORAL_TABLET | Freq: Every evening | ORAL | Status: DC
  Administered 2021-10-13: 10 mg via ORAL
  Filled 2021-10-13: qty 2

## 2021-10-13 MED ORDER — METOPROLOL SUCCINATE ER 25 MG PO TB24: 25.0000 mg | ORAL_TABLET | Freq: Every day | ORAL | Status: DC

## 2021-10-13 MED ORDER — PREDNISONE 20 MG PO TABS: 20.0000 mg | ORAL_TABLET | Freq: Every day | ORAL | Status: DC

## 2021-10-13 MED ORDER — PREDNISONE 10 MG PO TABS: 20.0000 mg | ORAL_TABLET | Freq: Every day | ORAL | 0 refills | Status: AC

## 2021-10-13 MED ORDER — GABAPENTIN 100 MG PO CAPS: 100.0000 mg | ORAL_CAPSULE | Freq: Two times a day (BID) | ORAL | Status: DC | PRN

## 2021-10-13 MED ORDER — OXYCODONE HCL 5 MG PO TABS
5.0000 mg | ORAL_TABLET | ORAL | Status: DC | PRN
  Administered 2021-10-13: 5 mg via ORAL
  Filled 2021-10-13: qty 1

## 2021-10-13 MED ORDER — PREDNISONE 50 MG PO TABS
60.0000 mg | ORAL_TABLET | Freq: Once | ORAL | Status: AC
  Administered 2021-10-13: 60 mg via ORAL
  Filled 2021-10-13: qty 1

## 2021-10-13 MED ORDER — PANTOPRAZOLE SODIUM 40 MG PO TBEC
40.0000 mg | DELAYED_RELEASE_TABLET | Freq: Every day | ORAL | Status: DC
  Administered 2021-10-13: 40 mg via ORAL
  Filled 2021-10-13: qty 1

## 2021-10-13 MED ORDER — ACETAMINOPHEN 500 MG PO TABS
1000.0000 mg | ORAL_TABLET | Freq: Four times a day (QID) | ORAL | Status: DC | PRN
Start: 2021-10-13 — End: 2021-10-14

## 2021-10-13 MED ORDER — LIDOCAINE-EPINEPHRINE (PF) 2 %-1:200000 IJ SOLN
10.0000 mL | Freq: Once | INTRAMUSCULAR | Status: AC
  Administered 2021-10-13: 10 mL via INTRADERMAL
  Filled 2021-10-13: qty 20

## 2021-10-13 MED ORDER — OXYCODONE HCL 5 MG PO TABS
5.0000 mg | ORAL_TABLET | Freq: Once | ORAL | Status: AC
  Administered 2021-10-13: 5 mg via ORAL
  Filled 2021-10-13: qty 1

## 2021-10-13 MED ORDER — OXYCODONE HCL 5 MG PO TABS: 5.0000 mg | ORAL_TABLET | Freq: Four times a day (QID) | ORAL | 0 refills | Status: DC | PRN

## 2021-10-13 MED ORDER — HYDRALAZINE HCL 25 MG PO TABS
100.0000 mg | ORAL_TABLET | Freq: Two times a day (BID) | ORAL | Status: DC
  Administered 2021-10-13: 100 mg via ORAL
  Filled 2021-10-13: qty 4

## 2021-10-13 NOTE — ED Triage Notes (Signed)
Pt BIB [facility] for R knee pain. H/x arthritis in this knee. Pt states that she was "running around" yesterday and thinks she may have overworked her knee. L arm fistula placed 5/31. Took 10 mg oxy at 0000.

## 2021-10-13 NOTE — Evaluation (Signed)
Physical Therapy Evaluation Patient Details Name: Alicia [name] MRN: [id number] DOB: 09/08/1955 Today's Date: 10/13/2021  History of Present Illness  Pt is a 66 y.o. female who presented 10/13/21 with pain and swelling in her R knee. Pt with joint aspiration with inflammatory arthritis. PMH: anemia, anxiety, ascites, breast cancer, cirrhosis, CKD, esophageal varices, GERD, HTN   Clinical Impression  Pt presents with condition above and deficits mentioned below, see PT Problem List. PTA, she was IND, normally without AD but intermittently using crutches when she was in pain. Pt lives with her adult son in a 1-level apartment with 14 STE. Currently, pt is demonstrating deficits in R knee ROM due to edema and pain along with R leg weakness, limited again primarily by pain. Pt also with deficits in activity tolerance and balance, placing her at risk for falls. Pt was only able to tolerate hopping a few times along EOB with RW and min guard-minA for stability before needing to sit due to the pain. She was unable to place much if any weight through her R leg, often holding it off the ground. Educated pt she is at risk for falls and needs physical assistance for mobility until her pain, balance, and activity tolerance improve. Discussed having one son either come down to stay with her and work from home while there or the other son taking time off work to stay home during the day to assist her as needed. Educated pt on use of DME. She verbalized understanding and reported she has the level of care needed at this time. Thus, recommending pt d/c home with HHPT, [place] services, and transportation services along with a RW and 3in1. Will continue to follow acutely.     Recommendations for follow up therapy are one component of a multi-disciplinary discharge planning process, led by the attending physician.  Recommendations may be updated based on patient status, additional functional criteria and insurance  authorization.  Follow Up Recommendations Home health PT (with [place] services; transportation services home and to/from HD due to inability to navigate stairs at this time)      Assistance Recommended at Discharge Frequent or constant Supervision/Assistance (initially)  Patient can return home with the following  A lot of help with walking and/or transfers;A little help with bathing/dressing/bathroom;Assistance with cooking/housework;Assist for transportation;Help with stairs or ramp for entrance    Equipment Recommendations Rolling walker (2 wheels);BSC/3in1  Recommendations for Other Services       Functional Status Assessment Patient has had a recent decline in their functional status and demonstrates the ability to make significant improvements in function in a reasonable and predictable amount of time.     Precautions / Restrictions Precautions Precautions: Fall Restrictions Weight Bearing Restrictions: No      Mobility  Bed Mobility Overal bed mobility: Needs Assistance Bed Mobility: Supine to Sit     Supine to sit: Min guard     General bed mobility comments: Extra time and cues to hook R leg with L to assist it to L side of stretcher to sit up, min guard for safety    Transfers Overall transfer level: Needs assistance Equipment used: Rolling walker (2 wheels) Transfers: Sit to/from Stand, Bed to chair/wheelchair/BSC Sit to Stand: Min guard, Min assist   Step pivot transfers: Min assist, Min guard       General transfer comment: Pt coming to stand from edge of stretcher 2x and from commode 1x, needing min guard-minA for stability due to pt only tolerating placing her R  toes/forefoot on the ground for min contact with transfers. Pt hopping R <> L to transfer bed <> commode using RW, min guard-minA for stability as pt would hold her R foot off the ground majority of time or lightly place her R toes/forefoot on the ground at most.    Ambulation/Gait Ambulation/Gait  assistance: Min guard, Min assist Gait Distance (Feet): 2 Feet (x3 bouts of ~1-2 ft each) Assistive device: Rolling walker (2 wheels) Gait Pattern/deviations: Step-to pattern, Trunk flexed, Antalgic, Knee flexed in stance - right, Decreased dorsiflexion - right Gait velocity: reduced Gait velocity interpretation: <1.31 ft/sec, indicative of household ambulator   General Gait Details: Pt hopping anterior <> posterior at EOB and R <> L to transfer bed <> commode using RW, min guard-minA for stability as pt would hold her R foot off the ground majority of time or lightly place her R toes/forefoot on the ground at most.  Financial trader [employer] (Stroke Patients Only)       Balance Overall balance assessment: Needs assistance Sitting-balance support: No upper extremity supported, Feet unsupported Sitting balance-Leahy Scale: Good     Standing balance support: Bilateral upper extremity supported, Single extremity supported, During functional activity Standing balance-Leahy Scale: Poor Standing balance comment: Reliant on UE support and up to minA                             Pertinent Vitals/Pain Pain Assessment Pain Assessment: Faces Faces Pain Scale: Hurts whole lot Pain Location: R knee Pain Descriptors / Indicators: Grimacing, Discomfort, Crying, Guarding, Moaning Pain Intervention(s): Limited activity within patient's tolerance, Monitored during session, Repositioned, Ice applied    Home Living Family/patient expects to be discharged to:: Private residence Living Arrangements: Children (son) Available Help at Discharge: Family;Other (Comment) (the son she lives with may be able to take off work to help her short term, another son could travel down to stay and help her 24/7 and work from home there if needed) Type of Home: Apartment Home Access: Stairs to enter Entrance Stairs-Rails: Can reach both Technical brewer of  Steps: [place]: One level Home Equipment: Crutches      Prior Function Prior Level of Function : Independent/Modified Independent;Driving             Mobility Comments: Uses crutches only when having pain in some of her joints, otherwise IND without DME. No falls.       Hand Dominance        Extremity/Trunk Assessment   Upper Extremity Assessment Upper Extremity Assessment: Overall WFL for tasks assessed    Lower Extremity Assessment Lower Extremity Assessment: RLE deficits/detail RLE Deficits / Details: edema noted around knee; pain limiting R leg strength and weight bearing tolerance along with R knee ROM; unable to achieve R knee extension AROM against gravity due to pain RLE: Unable to fully assess due to pain    Cervical / Trunk Assessment Cervical / Trunk Assessment: Normal  Communication   Communication: No difficulties  Cognition Arousal/Alertness: Awake/alert Behavior During Therapy: WFL for tasks assessed/performed Overall Cognitive Status: Within Functional Limits for tasks assessed                                 General Comments: Pt tangential at times, but can be redirected  General Comments General comments (skin integrity, edema, etc.): educated pt she is at risk for falls and needs physical assistance for mobility until her pain, balance, and activity tolerance improve. Discussed having one son either come down to stay with her and work from home while there or the other son taking time off work to stay home during the day to assist her as needed. Educated pt on use of DME.    Exercises     Assessment/Plan    PT Assessment Patient needs continued PT services  PT Problem List Decreased strength;Decreased range of motion;Decreased activity tolerance;Decreased balance;Decreased mobility;Pain       PT Treatment Interventions DME instruction;Stair training;Gait training;Functional mobility training;Therapeutic  activities;Therapeutic exercise;Balance training;Neuromuscular re-education;Patient/family education    PT Goals (Current goals can be found in the Care Plan section)  Acute Rehab PT Goals Patient Stated Goal: to go home PT Goal Formulation: With patient Time For Goal Achievement: 10/27/21 Potential to Achieve Goals: Good    Frequency Min 3X/week     Co-evaluation               AM-PAC PT "6 Clicks" Mobility  Outcome Measure Help needed turning from your back to your side while in a flat bed without using bedrails?: A Little Help needed moving from lying on your back to sitting on the side of a flat bed without using bedrails?: A Little Help needed moving to and from a bed to a chair (including a wheelchair)?: A Little Help needed standing up from a chair using your arms (e.g., wheelchair or bedside chair)?: A Little Help needed to walk in hospital room?: Total Help needed climbing 3-5 steps with a railing? : Total 6 Click Score: 14    End of Session Equipment Utilized During Treatment: Gait belt Activity Tolerance: Patient limited by pain Patient left: in bed   PT Visit Diagnosis: Unsteadiness on feet (R26.81);Other abnormalities of gait and mobility (R26.89);Muscle weakness (generalized) (M62.81);Difficulty in walking, not elsewhere classified (R26.2);Pain Pain - Right/Left: Right Pain - part of body: Knee    Time: 1249-1330 PT Time Calculation (min) (ACUTE ONLY): 41 min   Charges:   PT Evaluation $PT Eval Moderate Complexity: 1 Mod PT Treatments $Gait Training: 8-22 mins $Therapeutic Activity: 8-22 mins        Moishe Spice, PT, DPT Acute Rehabilitation Services  Office: [phone]   Alicia [name] 10/13/2021, 4:22 PM

## 2021-10-13 NOTE — ED Notes (Signed)
Pt from [facility] ED to ED. Pt sent for possible dialysis. Pt is also awaiting placement.

## 2021-10-13 NOTE — ED Notes (Signed)
Ptar called 

## 2021-10-13 NOTE — ED Notes (Signed)
Patient verbalizes understanding of discharge instructions. Opportunity for questioning and answers were provided. Armband removed by staff, pt discharged from ED. Report given to PTAR, pt and transport in understanding regarding DC instructions.

## 2021-10-13 NOTE — ED Notes (Signed)
Pt unable to ambulate up 13 stairs. PTAR called for assistance

## 2021-10-13 NOTE — ED Provider Notes (Signed)
Care of the patient received after transfer from [place].  Patient presented with complaints of right knee pain, joint aspiration w/ inflammatory arthritis. Unable to ambulate in the ED and was sent to [facility] for PT eval. Also w/ ESRD on HD TTHS, prior provider spoke Dr. Posey Pronto about possible dialysis.  Patient was transferred here to [facility], was evaluated by PT who recommended home health physical therapy as well as home health services, walker, 3 and 1 along with transportation home.  She had blood work done here per request of Dr. Posey Pronto, K is normal, appears euvolemic.  No other significant electrolyte abnormalities, sodium 131 which appears to be at baseline.  CBC unremarkable.  Hepatitis panel ordered by prior provider, negative.  I spoke with Dr. Posey Pronto, patient is okay to skip dialysis today and follow-up on Tuesday.  I discussed this with the patient and she is agreeable to this.  Social work engaged with arrangement of the above.  She has steroids and pain medicine for management and orthopedic follow-up pending.   Results for orders placed or performed during the hospital encounter of 10/13/21  Body fluid culture w Gram Stain   Specimen: Body Fluid  Result Value Ref Range   Specimen Description      FLUID Performed at [facility], [address] [place], [place] [address]    Special Requests      RIGHT KNEE Performed at [facility], [place], [place] [address]    Gram Stain      MODERATE WBC PRESENT, PREDOMINANTLY PMN NO ORGANISMS SEEN Performed at [facility], [place] [address], [place], [place] [address]    Culture PENDING    Report Status PENDING   Synovial cell count + diff, w/ crystals  Result Value Ref Range   Color, Synovial PINK (A) YELLOW   Appearance-Synovial CLOUDY (A) CLEAR   Crystals, Fluid NO CRYSTALS SEEN    WBC, Synovial 8,100 (H) 0 - 200 /cu mm   Neutrophil, Synovial 93 (H) 0 -  25 %   Lymphocytes-Synovial Fld 1 0 - 20 %   Monocyte-Macrophage-Synovial Fluid 6 (L) 50 - 90 %   Eosinophils-Synovial 0 0 - 1 %  CBC with Differential  Result Value Ref Range   WBC 5.3 4.0 - 10.5 K/uL   RBC 3.94 3.87 - 5.11 MIL/uL   Hemoglobin 11.2 (L) 12.0 - 15.0 g/dL   HCT 36.8 36.0 - 46.0 %   MCV 93.4 80.0 - 100.0 fL   MCH 28.4 26.0 - 34.0 pg   MCHC 30.4 30.0 - 36.0 g/dL   RDW 15.0 11.5 - 15.5 %   Platelets 89 (L) 150 - 400 K/uL   nRBC 0.0 0.0 - 0.2 %   Neutrophils Relative % 89 %   Neutro Abs 4.8 1.7 - 7.7 K/uL   Lymphocytes Relative 7 %   Lymphs Abs 0.4 (L) 0.7 - 4.0 K/uL   Monocytes Relative 3 %   Monocytes Absolute 0.2 0.1 - 1.0 K/uL   Eosinophils Relative 0 %   Eosinophils Absolute 0.0 0.0 - 0.5 K/uL   Basophils Relative 0 %   Basophils Absolute 0.0 0.0 - 0.1 K/uL   Immature Granulocytes 1 %   Abs Immature Granulocytes 0.03 0.00 - 0.07 K/uL  Basic metabolic panel  Result Value Ref Range   Sodium 131 (L) 135 - 145 mmol/L   Potassium 4.0 3.5 - 5.1 mmol/L   Chloride 94 (L) 98 - 111  mmol/L   CO2 24 22 - 32 mmol/L   Glucose, Bld 138 (H) 70 - 99 mg/dL   BUN 35 (H) 8 - 23 mg/dL   Creatinine, Ser 6.46 (H) 0.44 - 1.00 mg/dL   Calcium 9.3 8.9 - 10.3 mg/dL   GFR, Estimated 7 (L) >60 mL/min   Anion gap 13 5 - 15  Hepatitis panel, acute  Result Value Ref Range   Hepatitis B Surface Ag NON REACTIVE NON REACTIVE   HCV Ab PENDING NON REACTIVE   Hep A IgM PENDING NON REACTIVE   Hep B C IgM PENDING NON REACTIVE   DG Knee Complete 4 Views Right  Result Date: 10/13/2021 CLINICAL DATA:  Right knee pain. EXAM: RIGHT KNEE - COMPLETE 4+ VIEW COMPARISON:  None Available. FINDINGS: No acute fracture or dislocation. Moderate-to-severe degenerative changes are present in the medial compartment with subchondral sclerosis and cysts. Mild degenerative changes are present in the patellofemoral compartment. There is a large suprapatellar joint effusion. Vascular calcifications are present in  the soft tissues. IMPRESSION: 1. Moderate-to-severe degenerative changes, predominantly in the medial compartment. 2. Large suprapatellar joint effusion. Electronically Signed   By: Brett Fairy M.D.   On: 10/13/2021 04:27       Garald Balding, PA-C 38/17/71 [id number]    Lianne Cure, DO 90/38/33 0041

## 2021-10-13 NOTE — ED Provider Notes (Signed)
[place] DEPT MHP Provider Note: Alicia Spurling, MD, FACEP  CSN: [id number] MRN: [id number] ARRIVAL: 10/13/21 at [place]: [place]  Knee Pain   HISTORY OF PRESENT ILLNESS  10/13/21 4:38 AM Alicia [name] is a 66 y.o. female with a history of arthritis in the right knee.  She was exerting herself yesterday and thinks she may have overworked her knee.  She is having pain and swelling in that knee and she rates the pain as a 10 out of 10.  She is a renal patient on dialysis.  She took 10 mg of oxycodone at midnight.   Past Medical History:  Diagnosis Date   Anemia    Anxiety    Ascites    Breast cancer ([place])    mets   Cancer ([place])    stomach   Chronic anticoagulation    reported on for DVT prophylaxis, possibly in setting of cancer, but Eliquis d/c'd 08/2016 in setting of anemia/liver disease/acites   Cirrhosis ([place])    CKD (chronic kidney disease), stage III ([place])    Dailysis T-TH-S   Dyspnea    with fluid buildup, no current problems 01/10/21   Esophageal varices ([place]) 11/2016   Small noted on ENDO   GERD (gastroesophageal reflux disease)    Headache    otc med prn   History of blood transfusion 08/2020   Hypertension    Hypotension    Pneumonia    Portal hypertension ([place])     Past Surgical History:  Procedure Laterality Date   ABDOMINAL HYSTERECTOMY     AV FISTULA PLACEMENT Right 09/01/2020   Procedure: RIGHT ARM ARTERIOVENOUS 1st STAGE BASILIC;  Surgeon: Marty Heck, MD;  Location: [place];  Service: Vascular;  Laterality: Right;   AV FISTULA PLACEMENT Left 02/12/2021   Procedure: LEFT ARM FIRST STAGE BASILIC ARTERIOVENOUS (AV) FISTULA CREATION;  Surgeon: Marty Heck, MD;  Location: [place];  Service: Vascular;  Laterality: Left;   [place] Right 01/12/2021   Procedure: RIGHT ARM SECOND STAGE BASILIC VEIN TRANSPOSITION;  Surgeon: Marty Heck, MD;  Location: [place];  Service: Vascular;   Laterality: Right;   [place] Left 09/05/2021   Procedure: SECOND STAGE LEFT BASILIC VEIN TRANSPOSITION;  Surgeon: Marty Heck, MD;  Location: [place];  Service: Vascular;  Laterality: Left;   BREAST LUMPECTOMY     BREAST SURGERY     CESAREAN SECTION     COLONOSCOPY WITH PROPOFOL N/A 05/25/2019   Procedure: COLONOSCOPY WITH PROPOFOL;  Surgeon: Wilford Corner, MD;  [facility];  Service: Endoscopy;  Laterality: N/A;   ESOPHAGOGASTRODUODENOSCOPY (EGD) WITH PROPOFOL N/A 11/21/2016   Procedure: ESOPHAGOGASTRODUODENOSCOPY;  Surgeon: Milus Banister, MD;  [facility];  Service: Endoscopy;  Laterality: N/A;   ESOPHAGOGASTRODUODENOSCOPY (EGD) WITH PROPOFOL N/A 05/25/2019   Procedure: ESOPHAGOGASTRODUODENOSCOPY (EGD) WITH PROPOFOL;  Surgeon: Wilford Corner, MD;  [facility];  Service: Endoscopy;  Laterality: N/A;   INSERTION OF DIALYSIS CATHETER Left 09/01/2020   Procedure: INSERTION OF A LEFT INTERNAL JUGULAR TUNNELLED DIALYSIS CATHETER. ULTRASOUND GUIDED;  Surgeon: Marty Heck, MD;  Location: [place];  Service: Vascular;  Laterality: Left;   IR PARACENTESIS  09/04/2016   IR PARACENTESIS  09/20/2016   IR PARACENTESIS  10/22/2016   IR PARACENTESIS  01/27/2017   IR PARACENTESIS  04/11/2017   IR PARACENTESIS  06/05/2017   IR PARACENTESIS  06/05/2017   IR PARACENTESIS  06/19/2017   IR PARACENTESIS  07/03/2017  IR PARACENTESIS  07/17/2017   IR PARACENTESIS  08/01/2017   IR PARACENTESIS  08/14/2017   IR PARACENTESIS  08/28/2017   IR PARACENTESIS  09/11/2017   IR PARACENTESIS  09/25/2017   IR PARACENTESIS  10/08/2017   IR PARACENTESIS  10/23/2017   IR PARACENTESIS  11/06/2017   IR PARACENTESIS  11/20/2017   IR PARACENTESIS  12/04/2017   IR PARACENTESIS  12/18/2017   IR PARACENTESIS  01/19/2018   IR PARACENTESIS  02/09/2018   IR PARACENTESIS  02/24/2018   IR PARACENTESIS  03/16/2018   IR PARACENTESIS  04/10/2018   IR  PARACENTESIS  05/08/2018   IR PARACENTESIS  06/11/2018   IR PARACENTESIS  07/07/2018   IR PARACENTESIS  09/04/2018   IR PARACENTESIS  10/22/2018   IR PARACENTESIS  11/30/2018   IR PARACENTESIS  01/14/2019   IR PARACENTESIS  04/30/2019   IR PARACENTESIS  07/19/2019   IR PARACENTESIS  09/05/2020   IR RADIOLOGIST EVAL & MGMT  12/23/2017   LYMPH NODE DISSECTION     ULTRASOUND GUIDANCE FOR VASCULAR ACCESS Left 09/01/2020   Procedure: ULTRASOUND GUIDANCE FOR VASCULAR ACCESS;  Surgeon: Marty Heck, MD;  [facility];  Service: Vascular;  Laterality: Left;    Family History  Problem Relation Age of Onset   Diabetes Mother    Diabetes Father    Diabetes Brother    Colon cancer Neg Hx    Stomach cancer Neg Hx    Esophageal cancer Neg Hx     Social History   Tobacco Use   Smoking status: Former    Types: Cigars   Smokeless tobacco: Never   Tobacco comments:    Cigar 2-3 times a year   Vaping Use   Vaping Use: Never used  Substance Use Topics   Alcohol use: Not Currently    Comment: occassional wine, no ETOH since 11/2020   Drug use: No    Prior to Admission medications   Medication Sig Start Date End Date Taking? Authorizing Provider  oxyCODONE (ROXICODONE) 5 MG immediate release tablet Take 1 tablet (5 mg total) by mouth every 6 (six) hours as needed for up to 10 doses for breakthrough pain. 10/13/21  Yes Curatolo, Adam, DO  predniSONE (DELTASONE) 10 MG tablet Take 2 tablets (20 mg total) by mouth daily for 5 days. 10/13/21 10/18/21 Yes Curatolo, Adam, DO  acetaminophen (TYLENOL) 500 MG tablet Take 1,000 mg by mouth every 6 (six) hours as needed for moderate pain or headache.    [provider]  amLODipine (NORVASC) 10 MG tablet Take 10 mg by mouth every evening. 05/14/20   [provider]  apixaban (ELIQUIS) 2.5 MG TABS tablet Take 1 tablet (2.5 mg total) by mouth 2 (two) times daily. 05/21/21   Janina Mayo, MD  aspirin-acetaminophen-caffeine (EXCEDRIN  MIGRAINE) [phone] MG tablet Take 2 tablets by mouth every 6 (six) hours as needed for headache.    [provider]  calcitRIOL (ROCALTROL) 0.5 MCG capsule Take 0.5 mcg by mouth Every Tuesday,Thursday,and Saturday with dialysis.    [provider]  Calcium Carb-Cholecalciferol (CALCIUM 600 + D PO) Take 1 tablet by mouth every other day.    [provider]  calcium carbonate (TUMS - DOSED IN MG ELEMENTAL CALCIUM) 500 MG chewable tablet Chew 3 tablets by mouth 3 (three) times daily with meals.    [provider]  furosemide (LASIX) 40 MG tablet Take 40 mg by mouth every evening.    [provider]  gabapentin (NEURONTIN) 100 MG capsule Take 100 mg by mouth 2 (two) times daily as needed for itching. 05/23/21   [provider]  hydrALAZINE (APRESOLINE) 100 MG tablet Take 100 mg by mouth 2 (two) times daily. 05/16/20   [provider]  loperamide (IMODIUM) 2 MG capsule Take 4 mg by mouth as needed for diarrhea or loose stools.    [provider]  metoprolol succinate (TOPROL-XL) 25 MG 24 hr tablet Take 1 tablet (25 mg total) by mouth daily. Take with or immediately following a meal. Patient taking differently: Take 25 mg by mouth at bedtime. Take with or immediately following a meal. 07/25/21   Janina Mayo, MD  omeprazole (PRILOSEC) 20 MG capsule Take 20 mg by mouth daily as needed (acid reflux.).    [provider]  Phenylephrine-APAP-guaiFENesin (SINUS CONGESTION/PAIN DAYTIME) 5-325-200 MG TABS Take 1 tablet by mouth daily as needed (sinus issues.).    [provider]  sodium chloride (OCEAN) 0.65 % SOLN nasal spray Place 1 spray into both nostrils as needed for congestion.    [provider]  spironolactone (ALDACTONE) 25 MG tablet Take 25 mg by mouth every evening. 08/29/20   [provider]    Allergies Penicillins, Antivert [meclizine], Compazine [prochlorperazine edisylate], and Flexeril  [cyclobenzaprine]   REVIEW OF SYSTEMS  Negative except as noted here or in the History of Present Illness.   PHYSICAL EXAMINATION  Initial Vital Signs Blood pressure (!) 173/88, pulse 90, temperature 98.3 F (36.8 C), temperature source Oral, resp. rate 20, height 5' (1.524 m), weight 74.8 kg, SpO2 96 %.  Examination General: Well-developed, well-nourished female in no acute distress; appearance consistent with age of record HENT: normocephalic; atraumatic Eyes: Normal appearance Neck: supple Heart: regular rate and rhythm Lungs: clear to auscultation bilaterally Chest: Port-A-Cath right upper quadrant Abdomen: soft; nondistended; nontender; bowel sounds present Extremities: No deformity; tenderness and effusion of right knee with decreased range of motion Neurologic: Awake, alert and oriented; motor function intact in all extremities and symmetric; no facial droop Skin: Warm and dry Psychiatric: Moaning   RESULTS  Summary of this visit's results, reviewed and interpreted by myself:   EKG Interpretation  Date/Time:    Ventricular Rate:    PR Interval:    QRS Duration:   QT Interval:    QTC Calculation:   R Axis:     Text Interpretation:         Laboratory Studies: Results for orders placed or performed during the hospital encounter of 10/13/21 (from the past 24 hour(s))  Body fluid culture w Gram Stain     Status: None (Preliminary result)   Collection Time: 10/13/21  5:00 AM   Specimen: Body Fluid  Result Value Ref Range   Specimen Description      FLUID Performed at [facility], [address], [place], [place] [address]    Special Requests      RIGHT KNEE Performed at [facility], [place], [place] [address]    Gram Stain      MODERATE WBC PRESENT, PREDOMINANTLY PMN NO ORGANISMS SEEN Performed at [facility], [place] [address] [place], [place] [address]    Culture PENDING    Report Status  PENDING   Synovial cell count + diff, w/ crystals     Status: Abnormal   Collection Time: 10/13/21  5:00 AM  Result Value Ref Range   Color, Synovial PINK (A) YELLOW   Appearance-Synovial CLOUDY (A)  CLEAR   Crystals, Fluid NO CRYSTALS SEEN    WBC, Synovial 8,100 (H) 0 - 200 /cu mm   Neutrophil, Synovial 93 (H) 0 - 25 %   Lymphocytes-Synovial Fld 1 0 - 20 %   Monocyte-Macrophage-Synovial Fluid 6 (L) 50 - 90 %   Eosinophils-Synovial 0 0 - 1 %  CBC with Differential     Status: Abnormal   Collection Time: 10/13/21 11:30 AM  Result Value Ref Range   WBC 5.3 4.0 - 10.5 K/uL   RBC 3.94 3.87 - 5.11 MIL/uL   Hemoglobin 11.2 (L) 12.0 - 15.0 g/dL   HCT 36.8 36.0 - 46.0 %   MCV 93.4 80.0 - 100.0 fL   MCH 28.4 26.0 - 34.0 pg   MCHC 30.4 30.0 - 36.0 g/dL   RDW 15.0 11.5 - 15.5 %   Platelets 89 (L) 150 - 400 K/uL   nRBC 0.0 0.0 - 0.2 %   Neutrophils Relative % 89 %   Neutro Abs 4.8 1.7 - 7.7 K/uL   Lymphocytes Relative 7 %   Lymphs Abs 0.4 (L) 0.7 - 4.0 K/uL   Monocytes Relative 3 %   Monocytes Absolute 0.2 0.1 - 1.0 K/uL   Eosinophils Relative 0 %   Eosinophils Absolute 0.0 0.0 - 0.5 K/uL   Basophils Relative 0 %   Basophils Absolute 0.0 0.0 - 0.1 K/uL   Immature Granulocytes 1 %   Abs Immature Granulocytes 0.03 0.00 - 0.07 K/uL  Basic metabolic panel     Status: Abnormal   Collection Time: 10/13/21 11:30 AM  Result Value Ref Range   Sodium 131 (L) 135 - 145 mmol/L   Potassium 4.0 3.5 - 5.1 mmol/L   Chloride 94 (L) 98 - 111 mmol/L   CO2 24 22 - 32 mmol/L   Glucose, Bld 138 (H) 70 - 99 mg/dL   BUN 35 (H) 8 - 23 mg/dL   Creatinine, Ser 6.46 (H) 0.44 - 1.00 mg/dL   Calcium 9.3 8.9 - 10.3 mg/dL   GFR, Estimated 7 (L) >60 mL/min   Anion gap 13 5 - 15  Hepatitis panel, acute     Status: None   Collection Time: 10/13/21 11:30 AM  Result Value Ref Range   Hepatitis B Surface Ag NON REACTIVE NON REACTIVE   HCV Ab NON REACTIVE NON REACTIVE   Hep A IgM NON REACTIVE NON REACTIVE   Hep  B C IgM NON REACTIVE NON REACTIVE   Imaging Studies: DG Knee Complete 4 Views Right  Result Date: 10/13/2021 CLINICAL DATA:  Right knee pain. EXAM: RIGHT KNEE - COMPLETE 4+ VIEW COMPARISON:  None Available. FINDINGS: No acute fracture or dislocation. Moderate-to-severe degenerative changes are present in the medial compartment with subchondral sclerosis and cysts. Mild degenerative changes are present in the patellofemoral compartment. There is a large suprapatellar joint effusion. Vascular calcifications are present in the soft tissues. IMPRESSION: 1. Moderate-to-severe degenerative changes, predominantly in the medial compartment. 2. Large suprapatellar joint effusion. Electronically Signed   By: Brett Fairy M.D.   On: 10/13/2021 04:27    ED COURSE and MDM  Nursing notes, initial and subsequent vitals signs, including pulse oximetry, reviewed and interpreted by myself.  Vitals:   10/13/21 0738 10/13/21 1106 10/13/21 1747 10/13/21 2145  BP: (!) 180/92 (!) 173/89 (!) 173/94 (!) 153/94  Pulse: 84 77 70 74  Resp: '14 15 16 16  '$ Temp: 99.9 F (37.7 C) 98.5 F (36.9 C)  98.3 F (36.8 C)  TempSrc: Oral Oral  Oral  SpO2: 100% 95% 99% 99%  Weight:      Height:       Medications  oxyCODONE (Oxy IR/ROXICODONE) immediate release tablet 5 mg (5 mg Oral Given 10/13/21 1205)  predniSONE (DELTASONE) tablet 20 mg (has no administration in time range)  acetaminophen (TYLENOL) tablet 1,000 mg (has no administration in time range)  amLODipine (NORVASC) tablet 10 mg (10 mg Oral Given 10/13/21 1748)  apixaban (ELIQUIS) tablet 2.5 mg (2.5 mg Oral Given 10/13/21 1204)  gabapentin (NEURONTIN) capsule 100 mg (has no administration in time range)  furosemide (LASIX) tablet 40 mg (40 mg Oral Given 10/13/21 1748)  hydrALAZINE (APRESOLINE) tablet 100 mg (100 mg Oral Given 10/13/21 1156)  metoprolol succinate (TOPROL-XL) 24 hr tablet 25 mg (has no administration in time range)  pantoprazole (PROTONIX) EC tablet 40 mg (40  mg Oral Given 10/13/21 1156)  spironolactone (ALDACTONE) tablet 25 mg (25 mg Oral Given 10/13/21 1748)  lidocaine-EPINEPHrine (XYLOCAINE W/EPI) 2 %-1:200000 (PF) injection 10 mL (10 mLs Intradermal Given 10/13/21 0500)  HYDROmorphone (DILAUDID) injection 1 mg (1 mg Intramuscular Given 10/13/21 0505)  predniSONE (DELTASONE) tablet 60 mg (60 mg Oral Given 10/13/21 0741)  oxyCODONE (Oxy IR/ROXICODONE) immediate release tablet 5 mg (5 mg Oral Given 10/13/21 0741)   7:00 AM Signed out to Dr. Ronnald Nian. Synovial fluid results pending.    PROCEDURES  .Joint Aspiration/Arthrocentesis  Date/Time: 10/13/2021 5:03 AM  Performed by: Anallely Rosell, MD Authorized by: Knoxx Boeding, MD   Consent:    Consent obtained:  Verbal   Consent given by:  Patient   Risks, benefits, and alternatives were discussed: yes     Risks discussed:  Pain and incomplete drainage Universal protocol:    Required blood products, implants, devices, and special equipment available: yes     Immediately prior to procedure, a time out was called: yes     Patient identity confirmed:  Arm band and verbally with patient Location:    Location:  Knee   Knee:  R knee Anesthesia:    Anesthesia method:  Local infiltration   Local anesthetic:  Lidocaine 2% WITH epi Procedure details:    Preparation: Patient was prepped and draped in usual sterile fashion     Needle gauge:  18 G   Ultrasound guidance: no     Approach:  Medial   Aspirate amount:  64m   Aspirate characteristics:  Blood-tinged and cloudy   Steroid injected: no     Specimen collected: yes   Post-procedure details:    Dressing:  Adhesive bandage   Procedure completion:  Tolerated well, no immediate complications    ED DIAGNOSES     ICD-10-CM   1. Knee effusion, right  M25.461          MShanon Rosser MD 10/13/21 2236

## 2021-10-13 NOTE — ED Notes (Signed)
Per Lattie Haw lab at [facility] 1 hour left to result synovial cell count

## 2021-10-13 NOTE — ED Notes (Signed)
Called [place] [facility] to give report x4 no answer.

## 2021-10-13 NOTE — Discharge Instructions (Signed)
Take next dose of steroid tomorrow.  I have prescribed you narcotic pain medicine, do not drive or do dangerous activities while taking this medicine as it is sedating. Follow-up with orthopedics.

## 2021-10-13 NOTE — TOC Initial Note (Addendum)
Transition of Care [facility]) - Initial/Assessment Note    Patient Details  Name: Alicia [name] MRN: [id number] Date of Birth: 01/25/1956  Transition of Care [facility]) CM/SW Contact:    Verdell Carmine, RN Phone Number: 10/13/2021, 3:08 PM  Clinical Narrative:                    Patient in for knee pain, transferred from [place] for PT evaluation.Elizabeth Sauer verbal from PT paitent needs PT [facility] services Discussed with Provider, placed orders for PT OT Aide and CSW. Callaed [place], Do not have contract with [facility] faxed over information with call back number.  [facility]- CAN ACCEPT start date 7/9 Reached out to [facility] DME ordered via [employer], they will be bringing it up to ED room.      Patient Goals and CMS Choice        Expected Discharge Plan and Services                           DME Arranged: 3-N-1, [place] rolling DME Agency: [facility] Date DME Agency Contacted: 10/13/21 Time DME Agency Contacted: 1500 Representative spoke with at DME Agency: Mardene Celeste HH Arranged: PT, [place], Nurse's Aide, Social Work [employer] Agency: [facility] Date [place]: 10/13/21 Time [place]: 1501 Representative spoke with at [place]: faxed to [facility]  Prior Living Arrangements/Services                       Activities of Daily Living      Permission Sought/Granted                  Emotional Assessment              Admission diagnosis:  Knee pain Patient Active Problem List   Diagnosis Date Noted   ESRD on dialysis ([place])    Anasarca associated with disorder of kidney    Acute renal failure superimposed on stage 5 chronic kidney disease, not on chronic dialysis ([place]) 08/30/2020   Normocytic anemia 08/30/2020   Hyperkalemia 08/30/2020   Prolonged QT interval 08/30/2020   Metastatic breast cancer 10/01/2019   Focal lymphadenopathy 08/31/2019   Lymphadenopathy, axillary 08/31/2019   Mixed hyperlipidemia 05/28/2019   Special  screening for malignant neoplasms, colon 05/25/2019   Hyperphosphatemia 04/23/2019   Hypocalcemia 04/20/2019   History of breast cancer 04/05/2019   Rectus sheath hematoma 11/07/2017   Abdominal wall hematoma 11/06/2017   Other cirrhosis of liver ([place])    Secondary esophageal varices without bleeding ([place])    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

## 2021-10-13 NOTE — ED Provider Notes (Addendum)
Patient handed off to me.  Right knee effusion.  White count 8100.  No crystals.  Overall inflammatory arthritis.  Will prescribe prednisone and Roxicodone for pain.  She has crutches.  We will follow-up with orthopedic.    Patient was unable to safely ambulate.  She lives by herself has to go up several stairs.  Supposed have dialysis today.  She overall does not think she is can to be able to handle this.  Therefore I will transfer her to [facility] where she could potentially have dialysis if needed.  We will have her evaluated by PT and OT.  Dr. Francia Greaves accepts the patient in transfer to [facility].  I talked with Dr. Posey Pronto with nephrology who is aware that she may need dialysis if she is here for a long time or has abnormal labs.  This chart was dictated using voice recognition software.  Despite best efforts to proofread,  errors can occur which can change the documentation meaning.    Lennice Sites, DO 10/13/21 [place], [place], DO 10/13/21 [phone]

## 2021-10-15 ENCOUNTER — Emergency Department (HOSPITAL_COMMUNITY)
Admission: EM | Admit: 2021-10-15 | Discharge: 2021-10-15 | Disposition: A | Discharge status: Home / Self Care | Payer: Medicare (Managed Care) | Attending: Emergency Medicine | Admitting: Emergency Medicine

## 2021-10-15 ENCOUNTER — Other Ambulatory Visit: Payer: Self-pay

## 2021-10-15 ENCOUNTER — Telehealth: Payer: Self-pay

## 2021-10-15 ENCOUNTER — Encounter (HOSPITAL_COMMUNITY): Payer: Self-pay

## 2021-10-15 DIAGNOSIS — M25561 Pain in right knee: Secondary | ICD-10-CM

## 2021-10-15 DIAGNOSIS — N189 Chronic kidney disease, unspecified: Secondary | ICD-10-CM | POA: Diagnosis not present

## 2021-10-15 DIAGNOSIS — M25461 Effusion, right knee: Secondary | ICD-10-CM | POA: Insufficient documentation

## 2021-10-15 DIAGNOSIS — Z85028 Personal history of other malignant neoplasm of stomach: Secondary | ICD-10-CM | POA: Insufficient documentation

## 2021-10-15 DIAGNOSIS — I129 Hypertensive chronic kidney disease with stage 1 through stage 4 chronic kidney disease, or unspecified chronic kidney disease: Secondary | ICD-10-CM | POA: Insufficient documentation

## 2021-10-15 DIAGNOSIS — Z853 Personal history of malignant neoplasm of breast: Secondary | ICD-10-CM | POA: Diagnosis not present

## 2021-10-15 DIAGNOSIS — Z79899 Other long term (current) drug therapy: Secondary | ICD-10-CM | POA: Insufficient documentation

## 2021-10-15 DIAGNOSIS — Z7901 Long term (current) use of anticoagulants: Secondary | ICD-10-CM | POA: Insufficient documentation

## 2021-10-15 MED ORDER — HYDROCODONE-ACETAMINOPHEN 5-325 MG PO TABS
1.0000 | ORAL_TABLET | Freq: Once | ORAL | Status: AC
  Administered 2021-10-15: 1 via ORAL
  Filled 2021-10-15: qty 1

## 2021-10-15 NOTE — Progress Notes (Signed)
Transition of Care [facility]) - Emergency Department Mini Assessment   Patient Details  Name: Alicia [name] MRN: [id number] Date of Birth: December 30, 1955  Transition of Care [facility]) CM/SW Contact:    Roseanne Kaufman, RN Phone Number: 10/15/2021, 1:02 PM   Clinical Narrative: Patient presented to [facility] with c/o right knee pain and swelling x 3 days. This RNCM received a TOC consult for transportation needs to ortho and DME.    ED Mini Assessment:  This RNCM spoke with patient at bedside. Patient reports she needs PCP appointment, DME (crutches), and transportation home. This RNCM request patient's leasing office phone number to provide assistance, patient reports she can just call her son. Patient reports she can get up and down her stairs at home with out difficulty however she needs her crutches. Patient reports her son is home now however will be leaving around 4p and he will provide crutches so that she can get in the house. This RNCM notified Danielle with [employer] for assistance with crutches. Per [employer] patient will need to private pay $19.90 for crutches. This RNCM advised patient of cost, patient reports she can not afford, however she has crutches at home. Patient agrees with taxi ride home and her son will provide crutches. This RNCM scheduled follow up PCP appt on 10/17/21 at 12:50 pm. This RNCM also reprinted her AVS from 10/13/21 and advised the importance getting her prescription medications picked up. Patient reports she does not have difficulty obtaining medications. This RNCM also provided [facility] [phone].  No additional TOC needs at this time.  Patient Contact and Communications    Alicia [name] (son) [phone]- POA that lives in [place]   Alicia [name], Alicia [name] (Son lives with patient)  [phone] ([place])      Admission diagnosis:  Knee pain Patient Active Problem List   Diagnosis Date Noted   ESRD on dialysis [facility])    Anasarca associated with  disorder of kidney    Acute renal failure superimposed on stage 5 chronic kidney disease, not on chronic dialysis ([place]) 08/30/2020   Normocytic anemia 08/30/2020   Hyperkalemia 08/30/2020   Prolonged QT interval 08/30/2020   Metastatic breast cancer 10/01/2019   Focal lymphadenopathy 08/31/2019   Lymphadenopathy, axillary 08/31/2019   Mixed hyperlipidemia 05/28/2019   Special screening for malignant neoplasms, colon 05/25/2019   Hyperphosphatemia 04/23/2019   Hypocalcemia 04/20/2019   History of breast cancer 04/05/2019   Rectus sheath hematoma 11/07/2017   Abdominal wall hematoma 11/06/2017   Other cirrhosis of liver ([place])    Secondary esophageal varices without bleeding ([place])    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

## 2021-10-15 NOTE — Progress Notes (Signed)
TOC CSW received a consult in regard to transportation needs. Pt stated she did not have issues with transportation, however, she is having a problem with getting in and out of her apartment. Pt reported using [facility] to get to her appointments.  Pt reported having 14 steps that lead up to her apartment. Pt stated she lives with her son who assists sometimes. Pt stated she was at [facility] last week and was not told about her medications. CSW pointed out on pt.'s AVS her medications were sent to the [facility]. pt stated she uses her son's crunches to get up and down the steps. Pt wanted to know if that was ok to do. CSW informed pt if she felt safe using crutches to get up and down the steps then it would be ok. CSW suggested pt to get assistance whenever she can. CSW suggested getting the fire department to assist as well. Pt reported her frustration with her ER visit and threatened to sue the hospital for how she was treated. Pt stated she will need a ride home and requested crunches to help her get up to her apartment as her son will not be there. CSW requested assistance from [facility] with getting pt crutches.  Arlie Solomons.Jad Johansson, MSW, [place]  Transitions of Care Clinical Social Worker I Direct Dial: [phone]  Fax: [phone] [email or web address]

## 2021-10-15 NOTE — ED Triage Notes (Signed)
Pt coming from home via EMS with c/o R knee pain and swelling x3 days. Seen for same 2 days ago. Pt states that is has not gotten any better. States difficulty straightening R leg. Denies any injury.

## 2021-10-15 NOTE — Telephone Encounter (Signed)
This RNCM spoke with patient to make sure was able to get in her home safely. Patient advise she was at home safe and sound. No additional TOC needs at this time.

## 2021-10-15 NOTE — Discharge Instructions (Addendum)
You were seen today for re-evaluation of your right knee pain and swelling. Please pick up the prescriptions for pain medication and steroids that were sent Saturday. Please use your walker to ambulate. Follow up with orthopedic surgery. I have provided the contact information for the on-call provider from this weekend.    Another Transportation option: [employer] [phone].

## 2021-10-15 NOTE — ED Notes (Addendum)
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 Put pink bracelet on pt right arm

## 2021-10-15 NOTE — ED Provider Notes (Signed)
[facility] DEPT Provider Note   CSN: [id number] Arrival date & time: 10/15/21  5631     History  Chief Complaint  Patient presents with   Knee Pain    Alicia [name] is a 66 y.o. female.  Patient presents to the hospital complaining of 3 days of right knee pain and swelling.  [facility]uated [facility] on Saturday for the same complaint.  At that time she was found to have inflammatory arthritis and discharged home with prednisone and oxycodone for pain.  She was also ordered a walker and had a PT consult with plans to follow-up with orthopedics.  Patient returns today with the same pain and swelling.  Patient states she was unaware of the prescription for prednisone and oxycodone and has not taken any new medications since leaving the hospital Saturday.  Patient has concerns about being able to get to her orthopedic appointment due to living upstairs and feeling that she is unable to bear weight on the right knee.  She denies any injury or trauma to the area.  No new symptoms.  Past medical history significant for chronic kidney disease, ascites, hypertension, cirrhosis, stomach cancer, breast cancer, GERD  HPI     Home Medications Prior to Admission medications   Medication Sig Start Date End Date Taking? Authorizing Provider  acetaminophen (TYLENOL) 500 MG tablet Take 1,000 mg by mouth every 6 (six) hours as needed for moderate pain or headache.    [provider]  amLODipine (NORVASC) 10 MG tablet Take 10 mg by mouth every evening. 05/14/20   [provider]  apixaban (ELIQUIS) 2.5 MG TABS tablet Take 1 tablet (2.5 mg total) by mouth 2 (two) times daily. 05/21/21   Janina Mayo, MD  aspirin-acetaminophen-caffeine (EXCEDRIN MIGRAINE) [phone] MG tablet Take 2 tablets by mouth every 6 (six) hours as needed for headache.    [provider]  calcitRIOL (ROCALTROL) 0.5 MCG capsule Take 0.5 mcg by mouth  Every Tuesday,Thursday,and Saturday with dialysis.    [provider]  Calcium Carb-Cholecalciferol (CALCIUM 600 + D PO) Take 1 tablet by mouth every other day.    [provider]  calcium carbonate (TUMS - DOSED IN MG ELEMENTAL CALCIUM) 500 MG chewable tablet Chew 3 tablets by mouth 3 (three) times daily with meals.    [provider]  furosemide (LASIX) 40 MG tablet Take 40 mg by mouth every evening.    [provider]  gabapentin (NEURONTIN) 100 MG capsule Take 100 mg by mouth 2 (two) times daily as needed for itching. 05/23/21   [provider]  hydrALAZINE (APRESOLINE) 100 MG tablet Take 100 mg by mouth 2 (two) times daily. 05/16/20   [provider]  loperamide (IMODIUM) 2 MG capsule Take 4 mg by mouth as needed for diarrhea or loose stools.    [provider]  metoprolol succinate (TOPROL-XL) 25 MG 24 hr tablet Take 1 tablet (25 mg total) by mouth daily. Take with or immediately following a meal. Patient taking differently: Take 25 mg by mouth at bedtime. Take with or immediately following a meal. 07/25/21   Janina Mayo, MD  omeprazole (PRILOSEC) 20 MG capsule Take 20 mg by mouth daily as needed (acid reflux.).    [provider]  oxyCODONE (ROXICODONE) 5 MG immediate release tablet Take 1 tablet (5 mg total) by mouth every 6 (six) hours as needed for up to 10 doses for breakthrough pain. 10/13/21  Curatolo, Adam, DO  Phenylephrine-APAP-guaiFENesin (SINUS CONGESTION/PAIN DAYTIME) 5-325-200 MG TABS Take 1 tablet by mouth daily as needed (sinus issues.).    [provider]  predniSONE (DELTASONE) 10 MG tablet Take 2 tablets (20 mg total) by mouth daily for 5 days. 10/13/21 10/18/21  Curatolo, Adam, DO  sodium chloride (OCEAN) 0.65 % SOLN nasal spray Place 1 spray into both nostrils as needed for congestion.    [provider]  spironolactone (ALDACTONE) 25 MG tablet Take 25 mg by mouth every evening. 08/29/20    [provider]      Allergies    Penicillins, Antivert [meclizine], Compazine [prochlorperazine edisylate], and Flexeril [cyclobenzaprine]    Review of Systems   Review of Systems  Musculoskeletal:  Positive for arthralgias and joint swelling.    Physical Exam Updated Vital Signs BP (!) 193/97   Pulse 73   Temp 97.9 F (36.6 C) (Oral)   Resp 18   Ht 5' (1.524 m)   Wt 74.8 kg   SpO2 100%   BMI 32.22 kg/m  Physical Exam Vitals and nursing note reviewed.  Constitutional:      General: She is not in acute distress. HENT:     Head: Normocephalic and atraumatic.  Eyes:     Conjunctiva/sclera: Conjunctivae normal.  Cardiovascular:     Rate and Rhythm: Normal rate and regular rhythm.     Pulses: Normal pulses.  Pulmonary:     Effort: Pulmonary effort is normal.     Breath sounds: Normal breath sounds.  Musculoskeletal:     Cervical back: Normal range of motion.     Comments: Diffusely tender right knee with effusion and decreased range of motion  Skin:    General: Skin is warm and dry.  Neurological:     Mental Status: She is alert.     ED Results / Procedures / Treatments   Labs (all labs ordered are listed, but only abnormal results are displayed) Labs Reviewed - No data to display  EKG None  Radiology No results found.  Procedures Procedures    Medications Ordered in ED Medications  HYDROcodone-acetaminophen (NORCO/VICODIN) 5-325 MG per tablet 1 tablet (1 tablet Oral Given 10/15/21 1029)    ED Course/ Medical Decision Making/ A&P                           Medical Decision Making Risk Prescription drug management.   Patient presents for reevaluation of knee pain.  Patient has reportedly been ambulating using walker since her visit on Saturday.  She states she was unaware of home health care plans and unaware of prescriptions that were sent.  I reviewed discharge information from Saturday's appointment with patient.  I have let her know  where her prescriptions are.  Patient requested social work consult to discuss possible transportation and issues with living on second floor.  Social work consult has been requested.  I see no utility for repeat labs or imaging at today's visit since her symptoms are the same as they were Saturday and a fairly comprehensive work-up was completed at that time.  Plan to discharge patient home.  Social work saw patient and assisted as able. She may pick up her prescriptions that were sent on Saturday. Patient's son is picking up crutches. She needs to call and schedule a follow-up appointment with orthopedics.  I have provided the contact information of the on-call provider from Saturday.  Final Clinical Impression(s) / ED Diagnoses Final diagnoses:  Acute pain of right knee  Effusion of right knee    Rx / DC Orders ED Discharge Orders     None         Ronny Bacon 10/15/21 1313    Valarie Merino, MD 10/19/21 [phone]

## 2021-10-16 LAB — BODY FLUID CULTURE W GRAM STAIN: Culture: NO GROWTH

## 2021-10-18 ENCOUNTER — Telehealth: Payer: Self-pay | Admitting: Internal Medicine

## 2021-10-18 ENCOUNTER — Encounter (HOSPITAL_COMMUNITY): Payer: Self-pay | Admitting: *Deleted

## 2021-10-18 NOTE — Telephone Encounter (Signed)
Pt c/o medication issue:  1. Name of Medication: eliquis  2. How are you currently taking this medication (dosage and times per day)?   3. Are you having a reaction (difficulty breathing--STAT)?   4. What is your medication issue? Pt said, she was told last Monday that her eliquis samples was ready for her to pick up. She has not able to get it due to lack of transportation. She is planning to pick it up tomorrow. She wanted to make sure that her sample is still available for her to pick it up

## 2021-10-18 NOTE — Telephone Encounter (Signed)
-  Called pt to confirm samples are still upfront for pick -Left message to call back

## 2021-12-12 ENCOUNTER — Other Ambulatory Visit: Payer: Self-pay

## 2021-12-12 ENCOUNTER — Telehealth: Payer: Self-pay | Admitting: Internal Medicine

## 2021-12-12 NOTE — Telephone Encounter (Signed)
Pt c/o medication issue:  1. Name of Medication:   metoprolol succinate (TOPROL-XL) 25 MG 24 hr tablet  apixaban (ELIQUIS) 2.5 MG TABS tablet  2. How are you currently taking this medication (dosage and times per day)? As prescribed  3. Are you having a reaction (difficulty breathing--STAT)? No  4. What is your medication issue?   Patient called wanting to get a prescription for these medications sent to her mail order [facility].  Ph# [phone], [phone] or [phone]. Patient requested a call back to confirm prescription sent.

## 2021-12-12 NOTE — Telephone Encounter (Signed)
Attempted to contact patient, advised that I was unable to pull this pharmacy- the numbers provided are for support for [employer]. I did LVM to advised patient to call back to let me know which specific mail order pharmacy she needed.   Left call back number.

## 2021-12-14 ENCOUNTER — Telehealth: Payer: Self-pay | Admitting: Internal Medicine

## 2021-12-14 MED ORDER — APIXABAN 2.5 MG PO TABS: 2.5000 mg | ORAL_TABLET | Freq: Two times a day (BID) | ORAL | 1 refills | Status: AC

## 2021-12-14 NOTE — Telephone Encounter (Signed)
*  STAT* If patient is at the pharmacy, call can be transferred to refill team.   1. Which medications need to be refilled? (please list name of each medication and dose if known)  apixaban (ELIQUIS) 2.5 MG TABS tablet metoprolol succinate (TOPROL-XL) 25 MG 24 hr tablet  2. Which pharmacy/location (including street and city if local pharmacy) is medication to be sent to? [facility] [place], [place]  3. Do they need a 30 day or 90 day supply? 90 day supply

## 2021-12-14 NOTE — Telephone Encounter (Signed)
Prescription refill request for Eliquis received. Indication:Afib Last office visit:4/23 Scr:6.4 Age: 66 Weight:74.8 kg  Prescription refilled

## 2022-01-11 ENCOUNTER — Other Ambulatory Visit (HOSPITAL_COMMUNITY): Payer: Self-pay | Admitting: Physician Assistant

## 2022-01-11 DIAGNOSIS — K7031 Alcoholic cirrhosis of liver with ascites: Secondary | ICD-10-CM

## 2022-01-16 ENCOUNTER — Ambulatory Visit (HOSPITAL_COMMUNITY)
Admission: RE | Admit: 2022-01-16 | Discharge: 2022-01-16 | Disposition: A | Discharge status: Home / Self Care | Payer: Medicare (Managed Care) | Source: Ambulatory Visit | Attending: Physician Assistant | Admitting: Physician Assistant

## 2022-01-16 DIAGNOSIS — K7031 Alcoholic cirrhosis of liver with ascites: Secondary | ICD-10-CM | POA: Diagnosis not present

## 2022-01-16 HISTORY — PX: IR PARACENTESIS: IMG2679

## 2022-01-16 MED ORDER — LIDOCAINE HCL 1 % IJ SOLN
INTRAMUSCULAR | Status: AC
  Filled 2022-01-16: qty 20

## 2022-01-16 MED ORDER — LIDOCAINE HCL (PF) 1 % IJ SOLN
INTRAMUSCULAR | Status: DC | PRN
  Administered 2022-01-16: 10 mL

## 2022-01-16 NOTE — Procedures (Signed)
PROCEDURE SUMMARY:  Successful image-guided paracentesis from the right lower abdomen.  Yielded 3.9 liters of clear, yellow fluid.  No immediate complications.  EBL = trace. Patient tolerated well.   Specimen was not sent for labs.  Please see imaging section of Epic for full dictation.   Lura Em PA-C 01/16/2022 1:42 PM

## 2022-02-18 ENCOUNTER — Ambulatory Visit (HOSPITAL_COMMUNITY)
Admission: RE | Admit: 2022-02-18 | Discharge: 2022-02-18 | Disposition: A | Discharge status: Home / Self Care | Payer: Medicare (Managed Care) | Source: Ambulatory Visit | Attending: Physician Assistant | Admitting: Physician Assistant

## 2022-02-18 DIAGNOSIS — K7031 Alcoholic cirrhosis of liver with ascites: Secondary | ICD-10-CM | POA: Insufficient documentation

## 2022-02-18 HISTORY — PX: IR PARACENTESIS: IMG2679

## 2022-02-18 NOTE — Procedures (Signed)
PROCEDURE SUMMARY:  Successful ultrasound guided paracentesis from the right lower quadrant.  Yielded 3.5 L of clear yellow fluid.  No immediate complications.  The patient tolerated the procedure well.   Specimen not sent for labs.  EBL < 2 mL  If the patient eventually requires >/=2 paracenteses in a 30 day period, screening evaluation by the [facility] will be assessed.  Soyla Dryer, [place] [phone] 02/18/2022, 3:28 PM

## 2022-03-05 ENCOUNTER — Other Ambulatory Visit: Payer: Self-pay

## 2022-03-05 ENCOUNTER — Emergency Department (HOSPITAL_COMMUNITY)
Admission: EM | Admit: 2022-03-05 | Discharge: 2022-03-05 | Disposition: A | Discharge status: Home / Self Care | Payer: Medicare (Managed Care) | Attending: Emergency Medicine | Admitting: Emergency Medicine

## 2022-03-05 ENCOUNTER — Encounter (HOSPITAL_COMMUNITY): Payer: Self-pay

## 2022-03-05 ENCOUNTER — Emergency Department (HOSPITAL_COMMUNITY): Payer: Medicare (Managed Care)

## 2022-03-05 DIAGNOSIS — R55 Syncope and collapse: Secondary | ICD-10-CM | POA: Diagnosis present

## 2022-03-05 DIAGNOSIS — Z7901 Long term (current) use of anticoagulants: Secondary | ICD-10-CM | POA: Insufficient documentation

## 2022-03-05 DIAGNOSIS — Z992 Dependence on renal dialysis: Secondary | ICD-10-CM | POA: Insufficient documentation

## 2022-03-05 DIAGNOSIS — Z79899 Other long term (current) drug therapy: Secondary | ICD-10-CM | POA: Insufficient documentation

## 2022-03-05 DIAGNOSIS — I4891 Unspecified atrial fibrillation: Secondary | ICD-10-CM | POA: Diagnosis not present

## 2022-03-05 DIAGNOSIS — I10 Essential (primary) hypertension: Secondary | ICD-10-CM | POA: Insufficient documentation

## 2022-03-05 LAB — MAGNESIUM: Magnesium: 2 mg/dL (ref 1.7–2.4)

## 2022-03-05 LAB — CBC WITH DIFFERENTIAL/PLATELET
Abs Immature Granulocytes: 0.01 10*3/uL (ref 0.00–0.07)
Basophils Absolute: 0 10*3/uL (ref 0.0–0.1)
Basophils Relative: 1 %
Eosinophils Absolute: 0 10*3/uL (ref 0.0–0.5)
Eosinophils Relative: 1 %
HCT: 32.2 % — ABNORMAL LOW (ref 36.0–46.0)
Hemoglobin: 10 g/dL — ABNORMAL LOW (ref 12.0–15.0)
Immature Granulocytes: 0 %
Lymphocytes Relative: 13 %
Lymphs Abs: 0.4 10*3/uL — ABNORMAL LOW (ref 0.7–4.0)
MCH: 28.2 pg (ref 26.0–34.0)
MCHC: 31.1 g/dL (ref 30.0–36.0)
MCV: 90.7 fL (ref 80.0–100.0)
Monocytes Absolute: 0.2 10*3/uL (ref 0.1–1.0)
Monocytes Relative: 7 %
Neutro Abs: 2.2 10*3/uL (ref 1.7–7.7)
Neutrophils Relative %: 78 %
Platelets: 112 10*3/uL — ABNORMAL LOW (ref 150–400)
RBC: 3.55 MIL/uL — ABNORMAL LOW (ref 3.87–5.11)
RDW: 15.1 % (ref 11.5–15.5)
WBC: 2.9 10*3/uL — ABNORMAL LOW (ref 4.0–10.5)
nRBC: 0 % (ref 0.0–0.2)

## 2022-03-05 LAB — BASIC METABOLIC PANEL
Anion gap: 16 — ABNORMAL HIGH (ref 5–15)
BUN: 14 mg/dL (ref 8–23)
CO2: 26 mmol/L (ref 22–32)
Calcium: 7.3 mg/dL — ABNORMAL LOW (ref 8.9–10.3)
Chloride: 94 mmol/L — ABNORMAL LOW (ref 98–111)
Creatinine, Ser: 3.85 mg/dL — ABNORMAL HIGH (ref 0.44–1.00)
GFR, Estimated: 12 mL/min — ABNORMAL LOW (ref >60)
Glucose, Bld: 90 mg/dL (ref 70–99)
Potassium: 3.7 mmol/L (ref 3.5–5.1)
Sodium: 136 mmol/L (ref 135–145)

## 2022-03-05 LAB — TROPONIN I (HIGH SENSITIVITY)
Troponin I (High Sensitivity): 24 ng/L — ABNORMAL HIGH (ref <18)
Troponin I (High Sensitivity): 42 ng/L — ABNORMAL HIGH (ref <18)

## 2022-03-05 LAB — TSH: TSH: 1.108 u[IU]/mL (ref 0.350–4.500)

## 2022-03-05 MED ORDER — DILTIAZEM HCL-DEXTROSE 125-5 MG/125ML-% IV SOLN (PREMIX)
5.0000 mg/h | INTRAVENOUS | Status: DC
  Administered 2022-03-05: 5 mg/h via INTRAVENOUS
  Administered 2022-03-05: 7.5 mg/h via INTRAVENOUS
  Administered 2022-03-05: 10 mg/h via INTRAVENOUS
  Filled 2022-03-05: qty 125

## 2022-03-05 MED ORDER — DILTIAZEM LOAD VIA INFUSION
10.0000 mg | Freq: Once | INTRAVENOUS | Status: AC
  Administered 2022-03-05: 10 mg via INTRAVENOUS
  Filled 2022-03-05: qty 10

## 2022-03-05 MED ORDER — SODIUM CHLORIDE 0.9 % IV BOLUS
250.0000 mL | Freq: Once | INTRAVENOUS | Status: AC
  Administered 2022-03-05: 250 mL via INTRAVENOUS

## 2022-03-05 MED ORDER — METOPROLOL SUCCINATE ER 25 MG PO TB24
12.5000 mg | ORAL_TABLET | Freq: Once | ORAL | Status: AC
  Administered 2022-03-05: 12.5 mg via ORAL
  Filled 2022-03-05: qty 1

## 2022-03-05 NOTE — ED Provider Notes (Signed)
[facility] EMERGENCY DEPARTMENT Provider Note   CSN: [id number] Arrival date & time: 03/05/22  1544     History  Chief Complaint  Patient presents with   Near Syncope    Alicia [name] is a 66 y.o. female.  Patient here for get lightheaded at dialysis.  She gets hypotensive at dialysis sessions and they have been trying to take less fluid off but also have her hold her morning medications.  She got hypotensive in the 60s while at dialysis.  She felt like she is in a pass out but did not.  She had some chest discomfort that has not resolved.  EMS states that her blood pressure came up in route to the 130s however she went into A-fib with RVR in the 150s.  She is on Eliquis and metoprolol for this.  She had not taken her medications yet today.  She denies any nausea or vomiting.  No abdominal pain.  No headache.  No weakness or numbness.  The history is provided by the EMS personnel and the patient.       Home Medications Prior to Admission medications   Medication Sig Start Date End Date Taking? Authorizing Provider  acetaminophen (TYLENOL) 500 MG tablet Take 1,000 mg by mouth every 6 (six) hours as needed for moderate pain or headache.    [provider]  amLODipine (NORVASC) 10 MG tablet Take 10 mg by mouth every evening. 05/14/20   [provider]  apixaban (ELIQUIS) 2.5 MG TABS tablet Take 1 tablet (2.5 mg total) by mouth 2 (two) times daily. 12/14/21   Janina Mayo, MD  aspirin-acetaminophen-caffeine (EXCEDRIN MIGRAINE) [phone] MG tablet Take 2 tablets by mouth every 6 (six) hours as needed for headache.    [provider]  calcitRIOL (ROCALTROL) 0.5 MCG capsule Take 0.5 mcg by mouth Every Tuesday,Thursday,and Saturday with dialysis.    [provider]  Calcium Carb-Cholecalciferol (CALCIUM 600 + D PO) Take 1 tablet by mouth every other day.    [provider]  calcium carbonate (TUMS - DOSED IN MG ELEMENTAL  CALCIUM) 500 MG chewable tablet Chew 3 tablets by mouth 3 (three) times daily with meals.    [provider]  furosemide (LASIX) 40 MG tablet Take 40 mg by mouth every evening.    [provider]  gabapentin (NEURONTIN) 100 MG capsule Take 100 mg by mouth 2 (two) times daily as needed for itching. 05/23/21   [provider]  hydrALAZINE (APRESOLINE) 100 MG tablet Take 100 mg by mouth 2 (two) times daily. 05/16/20   [provider]  loperamide (IMODIUM) 2 MG capsule Take 4 mg by mouth as needed for diarrhea or loose stools.    [provider]  metoprolol succinate (TOPROL-XL) 25 MG 24 hr tablet Take 1 tablet (25 mg total) by mouth daily. Take with or immediately following a meal. Patient taking differently: Take 25 mg by mouth at bedtime. Take with or immediately following a meal. 07/25/21   Janina Mayo, MD  omeprazole (PRILOSEC) 20 MG capsule Take 20 mg by mouth daily as needed (acid reflux.).    [provider]  oxyCODONE (ROXICODONE) 5 MG immediate release tablet Take 1 tablet (5 mg total) by mouth every 6 (six) hours as needed for up to 10 doses for breakthrough pain. 10/13/21   Talib Headley, DO  Phenylephrine-APAP-guaiFENesin (SINUS CONGESTION/PAIN DAYTIME) 5-325-200 MG TABS Take 1 tablet by mouth daily as needed (sinus issues.).    [provider]  sodium chloride (OCEAN) 0.65 % SOLN nasal spray Place 1 spray into both nostrils as needed for congestion.    [provider]  spironolactone (ALDACTONE) 25 MG tablet Take 25 mg by mouth every evening. 08/29/20   [provider]      Allergies    Penicillins, Antivert [meclizine], Compazine [prochlorperazine edisylate], and Flexeril [cyclobenzaprine]    Review of Systems   Review of Systems  Physical Exam Updated Vital Signs BP (!) 140/80 (BP Location: Right Arm)   Pulse 79   Temp 98 F (36.7 C) (Oral)   Resp 19   Ht 5' (1.524 m)   Wt 69.9 kg   SpO2 99%   BMI  30.08 kg/m  Physical Exam Vitals and nursing note reviewed.  Constitutional:      General: She is not in acute distress.    Appearance: She is well-developed. She is not ill-appearing.  HENT:     Head: Normocephalic and atraumatic.     Nose: Nose normal.     Mouth/Throat:     Mouth: Mucous membranes are moist.  Eyes:     Extraocular Movements: Extraocular movements intact.     Conjunctiva/sclera: Conjunctivae normal.     Pupils: Pupils are equal, round, and reactive to light.  Cardiovascular:     Rate and Rhythm: Tachycardia present. Rhythm irregular.     Pulses: Normal pulses.     Heart sounds: No murmur heard. Pulmonary:     Effort: Pulmonary effort is normal. No respiratory distress.     Breath sounds: Normal breath sounds.  Abdominal:     Palpations: Abdomen is soft.     Tenderness: There is no abdominal tenderness.  Musculoskeletal:        General: No swelling.     Cervical back: Neck supple.  Skin:    General: Skin is warm and dry.     Capillary Refill: Capillary refill takes less than 2 seconds.  Neurological:     General: No focal deficit present.     Mental Status: She is alert.  Psychiatric:        Mood and Affect: Mood normal.     ED Results / Procedures / Treatments   Labs (all labs ordered are listed, but only abnormal results are displayed) Labs Reviewed  CBC WITH DIFFERENTIAL/PLATELET - Abnormal; Notable for the following components:      Result Value   WBC 2.9 (*)    RBC 3.55 (*)    Hemoglobin 10.0 (*)    HCT 32.2 (*)    Platelets 112 (*)    Lymphs Abs 0.4 (*)    All other components within normal limits  BASIC METABOLIC PANEL - Abnormal; Notable for the following components:   Chloride 94 (*)    Creatinine, Ser 3.85 (*)    Calcium 7.3 (*)    GFR, Estimated 12 (*)    Anion gap 16 (*)    All other components within normal limits  TROPONIN I (HIGH SENSITIVITY) - Abnormal; Notable for the following components:   Troponin I (High Sensitivity)  24 (*)    All other components within normal limits  TROPONIN I (HIGH SENSITIVITY) - Abnormal; Notable for the following components:   Troponin I (High Sensitivity) 42 (*)    All other components within normal limits  MAGNESIUM  TSH    EKG EKG Interpretation  Date/Time:  Tuesday March 05 2022 18:26:00 EST Ventricular Rate:  94 PR Interval:  161 QRS Duration: 76 QT  Interval:  398 QTC Calculation: 498 R Axis:   65 Text Interpretation: Sinus rhythm Probable left atrial enlargement Probable anteroseptal infarct, old Confirmed by Lennice Sites [phone]) on 03/05/2022 6:28:31 PM  Radiology DG Chest Portable 1 View  Result Date: 03/05/2022 CLINICAL DATA:  Near syncope.  Hypertension. EXAM: PORTABLE CHEST 1 VIEW COMPARISON:  05/19/2021 FINDINGS: Stable elevated right hemidiaphragm.  Right Port-A-Cath tip: SVC. Atherosclerotic calcification of the aortic arch. Heart size within normal limits for AP projection. Previous left central line has been removed. The lungs appear clear. No blunting of the costophrenic angles. IMPRESSION: 1. No active cardiopulmonary disease is radiographically apparent. 2. Stable elevated right hemidiaphragm. Electronically Signed   By: Van Clines M.D.   On: 03/05/2022 16:28    Procedures Procedures    Medications Ordered in ED Medications  diltiazem (CARDIZEM) 1 mg/mL load via infusion 10 mg (10 mg Intravenous Bolus from Bag 03/05/22 1636)    And  diltiazem (CARDIZEM) 125 mg in dextrose 5% 125 mL (1 mg/mL) infusion (0 mg/hr Intravenous Paused 03/05/22 1831)  sodium chloride 0.9 % bolus 250 mL (250 mLs Intravenous New Bag/Given 03/05/22 1636)  metoprolol succinate (TOPROL-XL) 24 hr tablet 12.5 mg (12.5 mg Oral Given 03/05/22 1831)    ED Course/ Medical Decision Making/ A&P                           Medical Decision Making Amount and/or Complexity of Data Reviewed Labs: ordered. Radiology: ordered.  Risk Prescription drug  management.   Shalayna Ornstein is here after near syncopal event at dialysis.  She missed a few sessions of dialysis last week but she had session today that she almost completed as well as 1 last Friday.  She has a history of end-stage renal disease on hemodialysis, atrial fibrillation on Eliquis and metoprolol.  She got lightheaded and almost passed out at dialysis.  EMS found her blood pressures to be in the 60s.  Heart rate to be in the 170s.  EKG with EMS was A-fib with RVR.  EKG upon arrival here shows atrial fibrillation with RVR.  However blood pressure now in the 120s.  She does not have any chest pain or shortness of breath but did have some chest pressure when the event occurred.  She has been in her normal state of health.  She denies any infectious symptoms.  Denies any abdominal pain.  She has been compliant with her medications including her Eliquis.  She has never been cardioverted before.  She does have a history of cirrhosis as well.  A-fib is new from this past year.  She had an echocardiogram after this diagnosis which was unremarkable.  Will start her on a dose of IV diltiazem which has helped in the past and check basic labs including CBC, BMP, chest x-ray to further evaluate for any other reason for these symptoms today.  My suspicion is that possibly stress or from dialysis caused her to go into A-fib with RVR.  She has been having hypotension issues at dialysis and may have been taken off less fluid and having her hold her morning medications till after dialysis.  Patient converted to normal sinus rhythm while on diltiazem drip.  I transitioned her to her home metoprolol dose.  Will continue to monitor to see if she maintains her rhythm.  Blood pressure now has stabilized with blood pressure now 130/70.  Per my review and interpretation of labs is no significant anemia  or electrolyte abnormality.  Creatinine at baseline.  Troponin mildly elevated at 24 but suspect that this is from  demand.  Will repeat a second 1.  She not having any chest pain.  Overall suspect A-fib with RVR likely in the setting of stressors from dialysis.  Seems to have resolved.  Anticipate discharge if repeat troponin stable.  Repeat troponin remarkable.  Overall vital signs have greatly normalized.  Blood pressure normal.  Heart rate in the 70s and a sinus rhythm.  He is not having any chest pain.  I have no concern for ACS and overall suspect troponin leak is secondary to demand from atrial fibrillation.  She is stable for discharge.  This chart was dictated using voice recognition software.  Despite best efforts to proofread,  errors can occur which can change the documentation meaning.         Final Clinical Impression(s) / ED Diagnoses Final diagnoses:  Atrial fibrillation with RVR [facility])    Rx / DC Orders ED Discharge Orders     None         Lennice Sites, DO 03/05/22 2057

## 2022-03-05 NOTE — Progress Notes (Signed)
TOC CSW consulted with pt.  Pt is in need of transportation home.  No family to assist with transportation, and pt does not have the money to pay for a ride home.  Pt is also having issues with dizziness which may prohibit her from walking long distances to and from bus depot.  CSW assisted with taxi voucher.  Izel Hochberg Tarpley-Carter, MSW, LCSW-A Pronouns:  She/Her/Hers [facility] HealthTransitions of Care Clinical Social Worker Direct Number:  [phone] [place] [email or web address]

## 2022-03-14 ENCOUNTER — Emergency Department (HOSPITAL_COMMUNITY): Payer: Medicare (Managed Care)

## 2022-03-14 ENCOUNTER — Emergency Department (HOSPITAL_COMMUNITY)
Admission: EM | Admit: 2022-03-14 | Discharge: 2022-03-15 | Disposition: A | Discharge status: Home / Self Care | Payer: Medicare (Managed Care) | Source: Home / Self Care | Attending: Emergency Medicine | Admitting: Emergency Medicine

## 2022-03-14 ENCOUNTER — Telehealth: Payer: Self-pay | Admitting: Internal Medicine

## 2022-03-14 DIAGNOSIS — I120 Hypertensive chronic kidney disease with stage 5 chronic kidney disease or end stage renal disease: Secondary | ICD-10-CM | POA: Insufficient documentation

## 2022-03-14 DIAGNOSIS — R051 Acute cough: Secondary | ICD-10-CM | POA: Insufficient documentation

## 2022-03-14 DIAGNOSIS — Z20822 Contact with and (suspected) exposure to covid-19: Secondary | ICD-10-CM | POA: Insufficient documentation

## 2022-03-14 DIAGNOSIS — D61818 Other pancytopenia: Secondary | ICD-10-CM | POA: Insufficient documentation

## 2022-03-14 DIAGNOSIS — Z7982 Long term (current) use of aspirin: Secondary | ICD-10-CM | POA: Insufficient documentation

## 2022-03-14 DIAGNOSIS — R0602 Shortness of breath: Secondary | ICD-10-CM | POA: Insufficient documentation

## 2022-03-14 DIAGNOSIS — Z79899 Other long term (current) drug therapy: Secondary | ICD-10-CM | POA: Insufficient documentation

## 2022-03-14 DIAGNOSIS — Z853 Personal history of malignant neoplasm of breast: Secondary | ICD-10-CM | POA: Insufficient documentation

## 2022-03-14 DIAGNOSIS — Z7901 Long term (current) use of anticoagulants: Secondary | ICD-10-CM | POA: Insufficient documentation

## 2022-03-14 DIAGNOSIS — Z992 Dependence on renal dialysis: Secondary | ICD-10-CM | POA: Insufficient documentation

## 2022-03-14 DIAGNOSIS — N186 End stage renal disease: Secondary | ICD-10-CM | POA: Insufficient documentation

## 2022-03-14 DIAGNOSIS — I480 Paroxysmal atrial fibrillation: Secondary | ICD-10-CM | POA: Diagnosis not present

## 2022-03-14 LAB — CBC WITH DIFFERENTIAL/PLATELET
Abs Immature Granulocytes: 0 10*3/uL (ref 0.00–0.07)
Basophils Absolute: 0 10*3/uL (ref 0.0–0.1)
Basophils Relative: 1 %
Eosinophils Absolute: 0.1 10*3/uL (ref 0.0–0.5)
Eosinophils Relative: 4 %
HCT: 31.1 % — ABNORMAL LOW (ref 36.0–46.0)
Hemoglobin: 9.6 g/dL — ABNORMAL LOW (ref 12.0–15.0)
Immature Granulocytes: 0 %
Lymphocytes Relative: 32 %
Lymphs Abs: 0.6 10*3/uL — ABNORMAL LOW (ref 0.7–4.0)
MCH: 28 pg (ref 26.0–34.0)
MCHC: 30.9 g/dL (ref 30.0–36.0)
MCV: 90.7 fL (ref 80.0–100.0)
Monocytes Absolute: 0.3 10*3/uL (ref 0.1–1.0)
Monocytes Relative: 16 %
Neutro Abs: 0.8 10*3/uL — ABNORMAL LOW (ref 1.7–7.7)
Neutrophils Relative %: 47 %
Platelets: 87 10*3/uL — ABNORMAL LOW (ref 150–400)
RBC: 3.43 MIL/uL — ABNORMAL LOW (ref 3.87–5.11)
RDW: 14.6 % (ref 11.5–15.5)
WBC: 1.8 10*3/uL — ABNORMAL LOW (ref 4.0–10.5)
nRBC: 0 % (ref 0.0–0.2)

## 2022-03-14 LAB — BRAIN NATRIURETIC PEPTIDE: B Natriuretic Peptide: 309.1 pg/mL — ABNORMAL HIGH (ref 0.0–100.0)

## 2022-03-14 LAB — BASIC METABOLIC PANEL
Anion gap: 13 (ref 5–15)
BUN: 9 mg/dL (ref 8–23)
CO2: 30 mmol/L (ref 22–32)
Calcium: 7.9 mg/dL — ABNORMAL LOW (ref 8.9–10.3)
Chloride: 92 mmol/L — ABNORMAL LOW (ref 98–111)
Creatinine, Ser: 3.34 mg/dL — ABNORMAL HIGH (ref 0.44–1.00)
GFR, Estimated: 15 mL/min — ABNORMAL LOW (ref >60)
Glucose, Bld: 88 mg/dL (ref 70–99)
Potassium: 3.4 mmol/L — ABNORMAL LOW (ref 3.5–5.1)
Sodium: 135 mmol/L (ref 135–145)

## 2022-03-14 LAB — RESP PANEL BY RT-PCR (FLU A&B, COVID) ARPGX2
Influenza A by PCR: NEGATIVE
Influenza B by PCR: NEGATIVE
SARS Coronavirus 2 by RT PCR: NEGATIVE

## 2022-03-14 MED ORDER — BENZONATATE 100 MG PO CAPS
100.0000 mg | ORAL_CAPSULE | Freq: Once | ORAL | Status: AC
  Administered 2022-03-14: 100 mg via ORAL
  Filled 2022-03-14: qty 1

## 2022-03-14 MED ORDER — HYDRALAZINE HCL 50 MG PO TABS
100.0000 mg | ORAL_TABLET | Freq: Once | ORAL | Status: AC
  Administered 2022-03-14: 100 mg via ORAL
  Filled 2022-03-14: qty 2

## 2022-03-14 MED ORDER — AMLODIPINE BESYLATE 5 MG PO TABS
10.0000 mg | ORAL_TABLET | Freq: Every evening | ORAL | Status: DC
  Administered 2022-03-14: 10 mg via ORAL
  Filled 2022-03-14: qty 2

## 2022-03-14 NOTE — Telephone Encounter (Signed)
Pt is currently at [facility] ED requesting to speak to someone. She was brought to hosp from dialysis due to SOB and coughing fit. They transferred from there. She states she is unsure as to what the plan is and she is waiting to see a doctor and tests to be run, but would like a call back to get advice.

## 2022-03-14 NOTE — Telephone Encounter (Signed)
Called patient, advised she was sent to ED from dialysis for SOB, and coughing. Patient states she has been coughing a lot, and she wants to know why. I did advise she should stay at the ED to have them evaluate her, she states she will stay there. We did discuss her BP was elevated as well and they need to take care of her there. Patient verbalized understanding.   Thankful for call back.

## 2022-03-14 NOTE — ED Triage Notes (Addendum)
Patient BIB [facility] from dialysis center for evaluation of shortness of breath that started a approximately two hours into a three and a half hour dialysis. BP 184/100 with dialysis. Patient reports significant improvement in shortness of breath since leaving dialysis center. Patient is alert, oriented, and in no apparent distress at this time.

## 2022-03-14 NOTE — ED Notes (Signed)
Pt reassessed, NAD noted at this time. AxOx4

## 2022-03-14 NOTE — ED Provider Triage Note (Signed)
Emergency Medicine Provider Triage Evaluation Note  Alicia [name] , a 66 y.o. female  was evaluated in triage.  Pt complains of shortness of breath while at dialysis today. Was at dialysis center today and got about 1 hour 11 mins left in her 3.5 hour dialysis treatment. Goes [facility] for dialysis. No sick contacts. Associated cough, rhinorrhea, nasal congestion. No history of MI or CHF. Denies chest pain. No history of asthma or COPD.    Review of Systems  Positive:  Negative:   Physical Exam  BP (!) 200/90   Pulse 97   Temp 98.4 F (36.9 C) (Oral)   Resp 18   SpO2 99%  Gen:   Awake, no distress   Resp:  Normal effort  MSK:   Moves extremities without difficulty  Other:    Medical Decision Making  Medically screening exam initiated at 2:48 PM.  Appropriate orders placed.  Madalee Altmann was informed that the remainder of the evaluation will be completed by another provider, this initial triage assessment does not replace that evaluation, and the importance of remaining in the ED until their evaluation is complete.     Kyndall Amero A, PA-C 03/14/22 [phone]

## 2022-03-14 NOTE — ED Provider Notes (Signed)
[facility] EMERGENCY DEPARTMENT Provider Note   CSN: [id number] Arrival date & time: 03/14/22  1438     History  Chief Complaint  Patient presents with   Shortness of Breath    Alicia [name] is a 66 y.o. female.  HPI   Medical history including end-stage renal disease on dialysis Tuesday Thursday Saturday, currently on Eliquis, metastatic breast cancer currently in remission, liver cirrhosis with ascites, hypertension presents with complaints of shortness of breath, states that she was getting her dialysis treatment had a coughing fit and became short of breath, she states that it resolved after she stopped coughing, she had no associated chest pain no pleuritic chest pain, she states that she currently feels fine at this time.  She states that she got all of about 1 hour of her dialysis, she states that he got her treatment on Tuesday.  She has no fevers no chills, no recent sick contacts, she has no body aches, she states that this cough is gone on  for last few days, unclear of where it is coming from, she states it comes on randomly, mainly when she is sitting, no stomach pains no nausea no vomiting she has no other complaints.    Home Medications Prior to Admission medications   Medication Sig Start Date End Date Taking? Authorizing Provider  benzonatate (TESSALON) 100 MG capsule Take 1 capsule (100 mg total) by mouth every 8 (eight) hours for 4 days. 03/15/22 03/19/22 Yes Marcello Fennel, PA-C  acetaminophen (TYLENOL) 500 MG tablet Take 1,000 mg by mouth every 6 (six) hours as needed for moderate pain or headache.    [provider]  amLODipine (NORVASC) 10 MG tablet Take 10 mg by mouth every evening. 05/14/20   [provider]  apixaban (ELIQUIS) 2.5 MG TABS tablet Take 1 tablet (2.5 mg total) by mouth 2 (two) times daily. 12/14/21   Janina Mayo, MD  aspirin-acetaminophen-caffeine (EXCEDRIN MIGRAINE) [phone] MG tablet Take 2 tablets by  mouth every 6 (six) hours as needed for headache.    [provider]  calcitRIOL (ROCALTROL) 0.5 MCG capsule Take 0.5 mcg by mouth Every Tuesday,Thursday,and Saturday with dialysis.    [provider]  Calcium Acetate 667 MG TABS Take 1,334 mg by mouth 3 (three) times daily with meals.    [provider]  calcium carbonate (TUMS - DOSED IN MG ELEMENTAL CALCIUM) 500 MG chewable tablet Chew 2 tablets by mouth 3 (three) times daily as needed for indigestion or heartburn.    [provider]  furosemide (LASIX) 40 MG tablet Take 40 mg by mouth every evening.    [provider]  gabapentin (NEURONTIN) 100 MG capsule Take 200 mg by mouth See admin instructions. Take 200 mg by mouth before dialysis on Tues/Thurs/Sat 05/23/21   [provider]  hydrALAZINE (APRESOLINE) 100 MG tablet Take 100 mg by mouth 2 (two) times daily. 05/16/20   [provider]  lidocaine-prilocaine (EMLA) cream Apply 1 Application topically as needed (dialysis).    [provider]  loperamide (IMODIUM) 2 MG capsule Take 4 mg by mouth daily as needed for diarrhea or loose stools.    [provider]  metoprolol succinate (TOPROL-XL) 25 MG 24 hr tablet Take 1 tablet (25 mg total) by mouth daily. Take with or immediately following a meal. Patient taking differently: Take 25 mg by mouth at bedtime. Take with or immediately following a meal. 07/25/21   Branch, Royetta Crochet, MD  oxyCODONE (  ROXICODONE) 5 MG immediate release tablet Take 1 tablet (5 mg total) by mouth every 6 (six) hours as needed for up to 10 doses for breakthrough pain. Patient not taking: Reported on 03/05/2022 10/13/21   Lennice Sites, DO  Phenylephrine-APAP-guaiFENesin (SINUS CONGESTION/PAIN DAYTIME) 5-325-200 MG TABS Take 1 tablet by mouth daily as needed (sinus issues.).    [provider]  PRILOSEC OTC 20 MG tablet Take 20 mg by mouth daily as needed (for heartburn or reflux).    [provider]  sodium chloride (OCEAN) 0.65 % SOLN nasal spray Place 1 spray into both nostrils as needed for congestion.    [provider]  spironolactone (ALDACTONE) 25 MG tablet Take 25 mg by mouth every evening. 08/29/20   [provider]      Allergies    Penicillins, Antivert [meclizine], Compazine [prochlorperazine edisylate], and Flexeril [cyclobenzaprine]    Review of Systems   Review of Systems  Constitutional:  Negative for chills and fever.  Respiratory:  Positive for cough. Negative for shortness of breath.   Cardiovascular:  Negative for chest pain.  Gastrointestinal:  Negative for abdominal pain.  Neurological:  Negative for headaches.    Physical Exam Updated Vital Signs BP (!) 183/104   Pulse 100   Temp 98 F (36.7 C)   Resp 20   SpO2 98%  Physical Exam Vitals and nursing note reviewed.  Constitutional:      General: She is not in acute distress.    Appearance: She is not ill-appearing.  HENT:     Head: Normocephalic and atraumatic.     Nose: No congestion.  Eyes:     Conjunctiva/sclera: Conjunctivae normal.  Cardiovascular:     Rate and Rhythm: Normal rate and regular rhythm.     Pulses: Normal pulses.     Heart sounds: No murmur heard.    No friction rub. No gallop.  Pulmonary:     Effort: No respiratory distress.     Breath sounds: No wheezing, rhonchi or rales.     Comments: No evidence of respiratory distress nontachypneic nonhypoxic, lung sounds are clear bilaterally. Musculoskeletal:     Right lower leg: No edema.     Left lower leg: No edema.     Comments: No peripheral edema present.  Skin:    General: Skin is warm and dry.     Comments: Left AC fistula good palpable thrill no evidence of infection present.  Neurological:     Mental Status: She is alert.  Psychiatric:        Mood and Affect: Mood normal.     ED Results / Procedures / Treatments   Labs (all labs ordered are listed, but only abnormal results are  displayed) Labs Reviewed  BASIC METABOLIC PANEL - Abnormal; Notable for the following components:      Result Value   Potassium 3.4 (*)    Chloride 92 (*)    Creatinine, Ser 3.34 (*)    Calcium 7.9 (*)    GFR, Estimated 15 (*)    All other components within normal limits  BRAIN NATRIURETIC PEPTIDE - Abnormal; Notable for the following components:   B Natriuretic Peptide 309.1 (*)    All other components within normal limits  CBC WITH DIFFERENTIAL/PLATELET - Abnormal; Notable for the following components:   WBC 1.8 (*)    RBC 3.43 (*)    Hemoglobin 9.6 (*)    HCT 31.1 (*)    Platelets 87 (*)    Neutro  Abs 0.8 (*)    Lymphs Abs 0.6 (*)    All other components within normal limits  RESP PANEL BY RT-PCR (FLU A&B, COVID) [id number]    EKG EKG Interpretation  Date/Time:  Thursday March 14 2022 14:48:47 EST Ventricular Rate:  98 PR Interval:  168 QRS Duration: 62 QT Interval:  388 QTC Calculation: 495 R Axis:   65 Text Interpretation: Normal sinus rhythm Possible Left atrial enlargement Anteroseptal infarct , age undetermined Abnormal ECG Confirmed by Orpah Greek [phone]) on 03/14/2022 11:38:47 PM  Radiology DG Chest 2 View  Result Date: 03/14/2022 CLINICAL DATA:  Shortness of breath. EXAM: CHEST - 2 VIEW COMPARISON:  March 05, 2022. FINDINGS: The heart size and mediastinal contours are within normal limits. Right internal jugular Port-A-Cath is unchanged in position. Both lungs are clear. The visualized skeletal structures are unremarkable. IMPRESSION: No active cardiopulmonary disease. Electronically Signed   By: Marijo Conception M.D.   On: 03/14/2022 16:48    Procedures Procedures    Medications Ordered in ED Medications  amLODipine (NORVASC) tablet 10 mg (10 mg Oral Given 03/14/22 2302)  benzonatate (TESSALON) capsule 100 mg (has no administration in time range)  benzonatate (TESSALON) capsule 100 mg (100 mg Oral Given 03/14/22 1720)  hydrALAZINE (APRESOLINE)  tablet 100 mg (100 mg Oral Given 03/14/22 2302)    ED Course/ Medical Decision Making/ A&P                           Medical Decision Making Amount and/or Complexity of Data Reviewed Radiology: ordered.  Risk Prescription drug management.   This patient presents to the ED for concern of shortness of breath, this involves an extensive number of treatment options, and is a complaint that carries with it a high risk of complications and morbidity.  The differential diagnosis includes CHF exacerbation, pneumonia, ACS, PE    Additional history obtained:  Additional history obtained from N/A  External records from outside source obtained and reviewed including oncology notes   Co morbidities that complicate the patient evaluation  Anticoag's, end-stage renal disease  Social Determinants of Health:  N/A    Lab Tests:  I Ordered, and personally interpreted labs.  The pertinent results include: CBC shows leukocytopenia with a white count of 1.8, normocytic anemia hemoglobin 9.6, platelets at 87, BMP shows potassium 3.4 creatinine 3, calcium 7.9 GFR 15 respiratory panel negative BNP is 309   Imaging Studies ordered:  I ordered imaging studies including DG chest I independently visualized and interpreted imaging which showed negative for acute findings I agree with the radiologist interpretation   Cardiac Monitoring:  The patient was maintained on a cardiac monitor.  I personally viewed and interpreted the cardiac monitored which showed an underlying rhythm of: Without signs of ischemia   Medicines ordered and prescription drug management:  I ordered medication including antihypertension medication I have reviewed the patients home medicines and have made adjustments as needed  Critical Interventions:  N/A   Reevaluation:  Presents with shortness of breath which is since resolved, triage obtain basic lab work imaging which I personally reviewed, notable for  worsening pancytopenia, she also had an elevated BP during my examination, she states that she did not take any of her blood pressure medications, will provide her with her antihypertensive medications and then speak with oncology for further recommendations regards to her pancytopenia.  Reassessed the patient, BP has improved, she has no complaints, she agreement with  discharge at this time.    Consultations Obtained:  I requested consultation with the oncology Dr. Irene Limbo,  and discussed lab and imaging findings as well as pertinent plan - they recommend: After discussing her CBC findings does not feel patient needs to be admitted but should follow-up as an outpatient with her oncologist.    Test Considered:  N/A    Rule out I have low suspicion for ACS as history is atypical,, EKG was sinus rhythm without signs of ischemia, troponins were deferred as patient had never experienced any chest pain.  Low suspicion for PE as patient denies pleuritic chest pain, shortness of breath, patient denies leg pain, no pedal edema noted on exam, currently anticoagulated has not missed any of her dosages.  Suspicion for CHF exacerbation is low she is not volume overloaded during my examination, BNP is only slightly elevated there is no evidence of pleural effusion seen on chest x-ray.  I doubt for emergent hemodialysis as patient has no new oxygen requirements no some electrolyte derangements.  Low suspicion for systemic infection as patient is nontoxic-appearing, vital signs reassuring, no obvious source infection noted on exam.  Suspicion for hypertensive emergency is low at this time as no evidence of organ damage present my exam, after given her BP medication her blood pressure has come back down.      Dispostion and problem list  After consideration of the diagnostic results and the patients response to treatment, I feel that the patent would benefit from discharge.  Cough-unclear etiology suspect  multifactorial, viral URI as well as possible GERD involvement, will have her continue with her PPI, and follow-up with her PCP for further evaluation. Pancytopenia-patient was made aware of these findings, will have her follow-up with her oncologist for further evaluation.            Final Clinical Impression(s) / ED Diagnoses Final diagnoses:  Acute cough  Pancytopenia ([place])    Rx / DC Orders ED Discharge Orders          Ordered    benzonatate (TESSALON) 100 MG capsule  Every 8 hours        03/15/22 0028              Marcello Fennel, PA-C 03/15/22 0029    Orpah Greek, MD 03/15/22 [phone]

## 2022-03-15 MED ORDER — BENZONATATE 100 MG PO CAPS
100.0000 mg | ORAL_CAPSULE | Freq: Once | ORAL | Status: AC
  Administered 2022-03-15: 100 mg via ORAL
  Filled 2022-03-15: qty 1

## 2022-03-15 MED ORDER — BENZONATATE 100 MG PO CAPS: 100.0000 mg | ORAL_CAPSULE | Freq: Three times a day (TID) | ORAL | 0 refills | Status: DC

## 2022-03-15 NOTE — Discharge Instructions (Signed)
Your lab work shows that your cell counts are low in your blood, your white cell count, your hemoglobin, your platelets, and your neutrophils, it is important that you call your cancer doctor today for further assessment. Cough-suspect this is from your acid reflux as well as a viral infection, I recommend the medication prescribed to you, you may take throat lozenges, drink tea with honey, you take Mucinex with a cough margin.  Follow-up with your PCP.  Come back to the emergency department if you develop chest pain, shortness of breath, severe abdominal pain, uncontrolled nausea, vomiting, diarrhea.

## 2022-03-15 NOTE — ED Notes (Signed)
RN reviewed discharge instructions with pt. Pt verbalized understanding and had no further questions. VSS upon discharge.  

## 2022-03-16 ENCOUNTER — Inpatient Hospital Stay (HOSPITAL_COMMUNITY)
Admission: EM | Admit: 2022-03-16 | Discharge: 2022-03-20 | DRG: 308 | Disposition: A | Discharge status: Home / Self Care | Payer: Medicare (Managed Care) | Attending: Internal Medicine | Admitting: Internal Medicine

## 2022-03-16 ENCOUNTER — Encounter (HOSPITAL_COMMUNITY): Payer: Self-pay

## 2022-03-16 ENCOUNTER — Other Ambulatory Visit: Payer: Self-pay

## 2022-03-16 ENCOUNTER — Emergency Department (HOSPITAL_COMMUNITY): Payer: Medicare (Managed Care)

## 2022-03-16 DIAGNOSIS — Z7901 Long term (current) use of anticoagulants: Secondary | ICD-10-CM

## 2022-03-16 DIAGNOSIS — Z880 Allergy status to penicillin: Secondary | ICD-10-CM

## 2022-03-16 DIAGNOSIS — Z841 Family history of disorders of kidney and ureter: Secondary | ICD-10-CM

## 2022-03-16 DIAGNOSIS — Z20822 Contact with and (suspected) exposure to covid-19: Secondary | ICD-10-CM | POA: Diagnosis present

## 2022-03-16 DIAGNOSIS — Z833 Family history of diabetes mellitus: Secondary | ICD-10-CM

## 2022-03-16 DIAGNOSIS — I482 Chronic atrial fibrillation, unspecified: Secondary | ICD-10-CM

## 2022-03-16 DIAGNOSIS — K7031 Alcoholic cirrhosis of liver with ascites: Secondary | ICD-10-CM | POA: Diagnosis present

## 2022-03-16 DIAGNOSIS — Z86718 Personal history of other venous thrombosis and embolism: Secondary | ICD-10-CM

## 2022-03-16 DIAGNOSIS — Z853 Personal history of malignant neoplasm of breast: Secondary | ICD-10-CM

## 2022-03-16 DIAGNOSIS — R059 Cough, unspecified: Secondary | ICD-10-CM | POA: Diagnosis present

## 2022-03-16 DIAGNOSIS — I4891 Unspecified atrial fibrillation: Secondary | ICD-10-CM | POA: Diagnosis present

## 2022-03-16 DIAGNOSIS — Z87891 Personal history of nicotine dependence: Secondary | ICD-10-CM

## 2022-03-16 DIAGNOSIS — D6869 Other thrombophilia: Secondary | ICD-10-CM | POA: Diagnosis present

## 2022-03-16 DIAGNOSIS — D696 Thrombocytopenia, unspecified: Secondary | ICD-10-CM

## 2022-03-16 DIAGNOSIS — Z992 Dependence on renal dialysis: Secondary | ICD-10-CM

## 2022-03-16 DIAGNOSIS — N2581 Secondary hyperparathyroidism of renal origin: Secondary | ICD-10-CM | POA: Diagnosis present

## 2022-03-16 DIAGNOSIS — D631 Anemia in chronic kidney disease: Secondary | ICD-10-CM

## 2022-03-16 DIAGNOSIS — D61818 Other pancytopenia: Secondary | ICD-10-CM | POA: Diagnosis present

## 2022-03-16 DIAGNOSIS — I120 Hypertensive chronic kidney disease with stage 5 chronic kidney disease or end stage renal disease: Secondary | ICD-10-CM | POA: Diagnosis present

## 2022-03-16 DIAGNOSIS — Z79899 Other long term (current) drug therapy: Secondary | ICD-10-CM

## 2022-03-16 DIAGNOSIS — E1122 Type 2 diabetes mellitus with diabetic chronic kidney disease: Secondary | ICD-10-CM | POA: Diagnosis present

## 2022-03-16 DIAGNOSIS — Z90710 Acquired absence of both cervix and uterus: Secondary | ICD-10-CM

## 2022-03-16 DIAGNOSIS — I480 Paroxysmal atrial fibrillation: Principal | ICD-10-CM | POA: Diagnosis present

## 2022-03-16 DIAGNOSIS — Z8249 Family history of ischemic heart disease and other diseases of the circulatory system: Secondary | ICD-10-CM

## 2022-03-16 DIAGNOSIS — I2489 Other forms of acute ischemic heart disease: Secondary | ICD-10-CM | POA: Diagnosis present

## 2022-03-16 DIAGNOSIS — M898X9 Other specified disorders of bone, unspecified site: Secondary | ICD-10-CM | POA: Diagnosis present

## 2022-03-16 DIAGNOSIS — Z888 Allergy status to other drugs, medicaments and biological substances status: Secondary | ICD-10-CM

## 2022-03-16 DIAGNOSIS — Z85028 Personal history of other malignant neoplasm of stomach: Secondary | ICD-10-CM

## 2022-03-16 DIAGNOSIS — Z8673 Personal history of transient ischemic attack (TIA), and cerebral infarction without residual deficits: Secondary | ICD-10-CM

## 2022-03-16 DIAGNOSIS — R188 Other ascites: Secondary | ICD-10-CM | POA: Diagnosis present

## 2022-03-16 DIAGNOSIS — I10 Essential (primary) hypertension: Secondary | ICD-10-CM | POA: Diagnosis present

## 2022-03-16 DIAGNOSIS — F419 Anxiety disorder, unspecified: Secondary | ICD-10-CM | POA: Diagnosis present

## 2022-03-16 DIAGNOSIS — N186 End stage renal disease: Secondary | ICD-10-CM

## 2022-03-16 DIAGNOSIS — K219 Gastro-esophageal reflux disease without esophagitis: Secondary | ICD-10-CM | POA: Diagnosis present

## 2022-03-16 LAB — RETICULOCYTES
Immature Retic Fract: 2.4 % (ref 2.3–15.9)
RBC.: 3.59 MIL/uL — ABNORMAL LOW (ref 3.87–5.11)
Retic Count, Absolute: 26.2 10*3/uL (ref 19.0–186.0)
Retic Ct Pct: 0.7 % (ref 0.4–3.1)

## 2022-03-16 LAB — CBC WITH DIFFERENTIAL/PLATELET
Abs Immature Granulocytes: 0.01 10*3/uL (ref 0.00–0.07)
Basophils Absolute: 0 10*3/uL (ref 0.0–0.1)
Basophils Relative: 1 %
Eosinophils Absolute: 0 10*3/uL (ref 0.0–0.5)
Eosinophils Relative: 2 %
HCT: 30.7 % — ABNORMAL LOW (ref 36.0–46.0)
Hemoglobin: 9.5 g/dL — ABNORMAL LOW (ref 12.0–15.0)
Immature Granulocytes: 1 %
Lymphocytes Relative: 20 %
Lymphs Abs: 0.4 10*3/uL — ABNORMAL LOW (ref 0.7–4.0)
MCH: 28.4 pg (ref 26.0–34.0)
MCHC: 30.9 g/dL (ref 30.0–36.0)
MCV: 91.6 fL (ref 80.0–100.0)
Monocytes Absolute: 0.2 10*3/uL (ref 0.1–1.0)
Monocytes Relative: 7 %
Neutro Abs: 1.5 10*3/uL — ABNORMAL LOW (ref 1.7–7.7)
Neutrophils Relative %: 69 %
Platelets: 85 10*3/uL — ABNORMAL LOW (ref 150–400)
RBC: 3.35 MIL/uL — ABNORMAL LOW (ref 3.87–5.11)
RDW: 14.7 % (ref 11.5–15.5)
WBC: 2.2 10*3/uL — ABNORMAL LOW (ref 4.0–10.5)
nRBC: 0 % (ref 0.0–0.2)

## 2022-03-16 LAB — RESPIRATORY PANEL BY PCR

## 2022-03-16 LAB — TROPONIN I (HIGH SENSITIVITY): Troponin I (High Sensitivity): 48 ng/L — ABNORMAL HIGH (ref <18)

## 2022-03-16 LAB — HEPATIC FUNCTION PANEL
ALT: 15 U/L (ref 0–44)
AST: 30 U/L (ref 15–41)
Albumin: 3.2 g/dL — ABNORMAL LOW (ref 3.5–5.0)
Alkaline Phosphatase: 84 U/L (ref 38–126)
Bilirubin, Direct: 0.2 mg/dL (ref 0.0–0.2)
Indirect Bilirubin: 0.4 mg/dL (ref 0.3–0.9)
Total Bilirubin: 0.6 mg/dL (ref 0.3–1.2)
Total Protein: 7.2 g/dL (ref 6.5–8.1)

## 2022-03-16 LAB — BASIC METABOLIC PANEL
Anion gap: 14 (ref 5–15)
BUN: 11 mg/dL (ref 8–23)
CO2: 28 mmol/L (ref 22–32)
Calcium: 8.6 mg/dL — ABNORMAL LOW (ref 8.9–10.3)
Chloride: 93 mmol/L — ABNORMAL LOW (ref 98–111)
Creatinine, Ser: 3.71 mg/dL — ABNORMAL HIGH (ref 0.44–1.00)
GFR, Estimated: 13 mL/min — ABNORMAL LOW (ref >60)
Glucose, Bld: 92 mg/dL (ref 70–99)
Potassium: 3.7 mmol/L (ref 3.5–5.1)
Sodium: 135 mmol/L (ref 135–145)

## 2022-03-16 LAB — RESP PANEL BY RT-PCR (FLU A&B, COVID) ARPGX2
Influenza A by PCR: NEGATIVE
Influenza B by PCR: NEGATIVE
SARS Coronavirus 2 by RT PCR: NEGATIVE

## 2022-03-16 LAB — HIV ANTIBODY (ROUTINE TESTING W REFLEX): HIV Screen 4th Generation wRfx: NONREACTIVE

## 2022-03-16 LAB — PROTIME-INR
INR: 1.1 (ref 0.8–1.2)
Prothrombin Time: 14.3 seconds (ref 11.4–15.2)

## 2022-03-16 LAB — TSH: TSH: 1.215 u[IU]/mL (ref 0.350–4.500)

## 2022-03-16 LAB — MAGNESIUM: Magnesium: 1.9 mg/dL (ref 1.7–2.4)

## 2022-03-16 MED ORDER — CALCITRIOL 0.5 MCG PO CAPS
0.5000 ug | ORAL_CAPSULE | ORAL | Status: DC
  Administered 2022-03-19: 0.5 ug via ORAL
  Filled 2022-03-16: qty 1

## 2022-03-16 MED ORDER — METOPROLOL TARTRATE 25 MG PO TABS
12.5000 mg | ORAL_TABLET | Freq: Two times a day (BID) | ORAL | Status: DC
  Administered 2022-03-16 – 2022-03-17 (×2): 12.5 mg via ORAL
  Filled 2022-03-16 (×2): qty 1

## 2022-03-16 MED ORDER — CALCIUM ACETATE (PHOS BINDER) 667 MG PO CAPS
1334.0000 mg | ORAL_CAPSULE | Freq: Three times a day (TID) | ORAL | Status: DC
  Administered 2022-03-17 – 2022-03-19 (×7): 1334 mg via ORAL
  Filled 2022-03-16 (×10): qty 2

## 2022-03-16 MED ORDER — DILTIAZEM LOAD VIA INFUSION
10.0000 mg | Freq: Once | INTRAVENOUS | Status: AC
  Administered 2022-03-16: 10 mg via INTRAVENOUS
  Filled 2022-03-16: qty 10

## 2022-03-16 MED ORDER — DILTIAZEM HCL-DEXTROSE 125-5 MG/125ML-% IV SOLN (PREMIX)
5.0000 mg/h | INTRAVENOUS | Status: AC
  Administered 2022-03-16: 5 mg/h via INTRAVENOUS
  Filled 2022-03-16: qty 125

## 2022-03-16 MED ORDER — APIXABAN 2.5 MG PO TABS
2.5000 mg | ORAL_TABLET | Freq: Two times a day (BID) | ORAL | Status: DC
  Administered 2022-03-16 – 2022-03-17 (×3): 2.5 mg via ORAL
  Filled 2022-03-16 (×5): qty 1

## 2022-03-16 MED ORDER — POLYETHYLENE GLYCOL 3350 17 G PO PACK: 17.0000 g | PACK | Freq: Every day | ORAL | Status: DC | PRN

## 2022-03-16 MED ORDER — PANTOPRAZOLE SODIUM 20 MG PO TBEC
40.0000 mg | DELAYED_RELEASE_TABLET | Freq: Every day | ORAL | Status: DC
  Administered 2022-03-17 – 2022-03-20 (×3): 40 mg via ORAL
  Filled 2022-03-16 (×3): qty 2

## 2022-03-16 MED ORDER — GABAPENTIN 100 MG PO CAPS
200.0000 mg | ORAL_CAPSULE | ORAL | Status: DC
  Administered 2022-03-19: 200 mg via ORAL
  Filled 2022-03-16 (×2): qty 2

## 2022-03-16 NOTE — ED Provider Notes (Signed)
[facility] EMERGENCY DEPARTMENT Provider Note   CSN: [id number] Arrival date & time: 03/16/22  1421     History  Chief Complaint  Patient presents with   Atrial Fibrillation    Alicia [name] is a 66 y.o. female.   Atrial Fibrillation  Patient presents with atrial fibrillation.  Was at dialysis today began to feel chest pain shortness of breath.  Found by EMS to be found in A-fib with RVR.  History atrial fibrillation.  He is on Eliquis but also has history of cirrhosis.  As of 2 days ago was not in A-fib but as of 2 weeks ago did have an episode of A-fib where she converted with Cardizem and metoprolol in the ER.  Is due to have paracentesis in 4 days.  Did not reportedly complete dialysis.    Past Medical History:  Diagnosis Date   Anemia    Anxiety    Ascites    Breast cancer ([place])    mets   Cancer ([place])    stomach   Chronic anticoagulation    reported on for DVT prophylaxis, possibly in setting of cancer, but Eliquis d/c'd 08/2016 in setting of anemia/liver disease/acites   Cirrhosis ([place])    CKD (chronic kidney disease), stage III ([place])    Dailysis T-TH-S   Dyspnea    with fluid buildup, no current problems 01/10/21   Esophageal varices ([place]) 11/2016   Small noted on ENDO   GERD (gastroesophageal reflux disease)    Headache    otc med prn   History of blood transfusion 08/2020   Hypertension    Hypotension    Pneumonia    Portal hypertension ([place])     Home Medications Prior to Admission medications   Medication Sig Start Date End Date Taking? Authorizing Provider  amLODipine (NORVASC) 10 MG tablet Take 10 mg by mouth every evening. 05/14/20  Yes [provider]  apixaban (ELIQUIS) 2.5 MG TABS tablet Take 1 tablet (2.5 mg total) by mouth 2 (two) times daily. 12/14/21  Yes Janina Mayo, MD  Azelastine HCl 137 MCG/SPRAY SOLN Place into both nostrils. 03/13/22  Yes [provider]  calcitRIOL (ROCALTROL) 0.5 MCG capsule Take  0.5 mcg by mouth Every Tuesday,Thursday,and Saturday with dialysis.   Yes [provider]  Calcium Acetate 667 MG TABS Take 1,334 mg by mouth 3 (three) times daily with meals.   Yes [provider]  gabapentin (NEURONTIN) 100 MG capsule Take 200 mg by mouth See admin instructions. Take 200 mg by mouth before dialysis on Tues/Thurs/Sat 05/23/21  Yes [provider]  guaiFENesin (MUCINEX) 600 MG 12 hr tablet Take by mouth. 03/13/22 03/13/23 Yes [provider]  hydrALAZINE (APRESOLINE) 100 MG tablet Take 100 mg by mouth 2 (two) times daily. 05/16/20  Yes [provider]  metoprolol succinate (TOPROL-XL) 25 MG 24 hr tablet Take 1 tablet (25 mg total) by mouth daily. Take with or immediately following a meal. Patient taking differently: Take 25 mg by mouth at bedtime. Take with or immediately following a meal. 07/25/21  Yes Branch, Royetta Crochet, MD  acetaminophen (TYLENOL) 500 MG tablet Take 1,000 mg by mouth every 6 (six) hours as needed for moderate pain or headache.    [provider]  aspirin-acetaminophen-caffeine (EXCEDRIN MIGRAINE) [phone] MG tablet Take 2 tablets by mouth every 6 (six) hours as needed for headache.    [provider]  benzonatate (TESSALON) 100 MG capsule Take 1 capsule (100 mg total) by  mouth every 8 (eight) hours for 4 days. 03/15/22 03/19/22  Marcello Fennel, PA-C  calcium carbonate (TUMS - DOSED IN MG ELEMENTAL CALCIUM) 500 MG chewable tablet Chew 2 tablets by mouth 3 (three) times daily as needed for indigestion or heartburn.    [provider]  furosemide (LASIX) 40 MG tablet Take 40 mg by mouth every evening.    [provider]  lidocaine-prilocaine (EMLA) cream Apply 1 Application topically as needed (dialysis).    [provider]  loperamide (IMODIUM) 2 MG capsule Take 4 mg by mouth daily as needed for diarrhea or loose stools.    [provider]  oxyCODONE (ROXICODONE) 5 MG  immediate release tablet Take 1 tablet (5 mg total) by mouth every 6 (six) hours as needed for up to 10 doses for breakthrough pain. Patient not taking: Reported on 03/05/2022 10/13/21   Lennice Sites, DO  Phenylephrine-APAP-guaiFENesin (SINUS CONGESTION/PAIN DAYTIME) 5-325-200 MG TABS Take 1 tablet by mouth daily as needed (sinus issues.).    [provider]  PRILOSEC OTC 20 MG tablet Take 20 mg by mouth daily as needed (for heartburn or reflux).    [provider]  sodium chloride (OCEAN) 0.65 % SOLN nasal spray Place 1 spray into both nostrils as needed for congestion.    [provider]  spironolactone (ALDACTONE) 25 MG tablet Take 25 mg by mouth every evening. 08/29/20   [provider]      Allergies    Penicillins, Antivert [meclizine], Compazine [prochlorperazine edisylate], and Flexeril [cyclobenzaprine]    Review of Systems   Review of Systems  Physical Exam Updated Vital Signs BP 124/79   Pulse (!) 124   Temp 98.4 F (36.9 C) (Oral)   Resp 20   SpO2 92%  Physical Exam Vitals and nursing note reviewed.  HENT:     Head: Atraumatic.  Cardiovascular:     Rate and Rhythm: Tachycardia present. Rhythm irregular.  Pulmonary:     Breath sounds: No wheezing or rhonchi.  Abdominal:     Tenderness: There is no abdominal tenderness.     Comments: Distended abdomen.  Musculoskeletal:     Cervical back: Neck supple.  Skin:    General: Skin is warm.  Neurological:     Mental Status: She is oriented to person, place, and time.     ED Results / Procedures / Treatments   Labs (all labs ordered are listed, but only abnormal results are displayed) Labs Reviewed  BASIC METABOLIC PANEL - Abnormal; Notable for the following components:      Result Value   Chloride 93 (*)    Creatinine, Ser 3.71 (*)    Calcium 8.6 (*)    GFR, Estimated 13 (*)    All other components within normal limits  CBC WITH DIFFERENTIAL/PLATELET - Abnormal; Notable for  the following components:   WBC 2.2 (*)    RBC 3.35 (*)    Hemoglobin 9.5 (*)    HCT 30.7 (*)    Platelets 85 (*)    Neutro Abs 1.5 (*)    Lymphs Abs 0.4 (*)    All other components within normal limits  HEPATIC FUNCTION PANEL - Abnormal; Notable for the following components:   Albumin 3.2 (*)    All other components within normal limits  TROPONIN I (HIGH SENSITIVITY) - Abnormal; Notable for the following components:   Troponin I (High Sensitivity) 48 (*)    All other components within normal limits  MAGNESIUM  PROTIME-INR  TROPONIN I (HIGH SENSITIVITY)    EKG EKG Interpretation  Date/Time:  Saturday March 16 2022 14:23:30 EST Ventricular Rate:  139 PR Interval:    QRS Duration: 80 QT Interval:  349 QTC Calculation: 531 R Axis:   85 Text Interpretation: Atrial fibrillation Borderline right axis deviation Anteroseptal infarct, old Repol abnrm suggests ischemia, diffuse leads Prolonged QT interval afib has replaced sinus rhythm Confirmed by Sherwood Gambler [phone]) on 03/16/2022 2:26:25 PM  Radiology DG Chest Portable 1 View  Result Date: 03/16/2022 CLINICAL DATA:  chest pain, dialysis EXAM: PORTABLE CHEST 1 VIEW COMPARISON:  03/14/2022 FINDINGS: Cardiac silhouette is prominent. There is pulmonary interstitial prominence with vascular congestion. No focal consolidation. No pneumothorax or pleural effusion identified. Aorta is calcified. Right-sided Port-A-Cath tip overlies distal SVC. IMPRESSION: Findings suggest CHF. Electronically Signed   By: Sammie Bench M.D.   On: 03/16/2022 15:06    Procedures Procedures    Medications Ordered in ED Medications  diltiazem (CARDIZEM) 1 mg/mL load via infusion 10 mg (10 mg Intravenous Bolus from Bag 03/16/22 1608)    And  diltiazem (CARDIZEM) 125 mg in dextrose 5% 125 mL (1 mg/mL) infusion (5 mg/hr Intravenous New Bag/Given 03/16/22 1609)    ED Course/ Medical Decision Making/ A&P                           Medical Decision  Making Amount and/or Complexity of Data Reviewed Labs: ordered.  Risk Prescription drug management.   Patient with shortness of breath and chest pain.  History of end-stage renal disease on dialysis.  Differential diagnosis is long but includes arrhythmia, volume overload, acute coronary syndrome.  Found to be in A-fib with RVR.  CHA2DS2-VASc of 2 but he is on anticoagulation.  History of breast cancer.  Had not had complete dialysis done.  X-ray dependently interpreted does show some volume overload.  At this point it sounds like patient may be in and out of A-fib and not sure cardioversion would really benefit after having just been it 2 weeks ago.  States she is compliant with the medication however.  Vitals maintained.  Will start Cardizem drip and likely require admission to the hospital.  Continue be somewhat tachycardic on Cardizem drip.  Discussed with Dr. Moshe Cipro from nephrology who will see patient in consult.  Also discussed with Dr. Percival Spanish who will see patient in consult.  Will admit to unassigned medicine.  CRITICAL CARE Performed by: Davonna Belling Total critical care time: 30 minutes Critical care time was exclusive of separately billable procedures and treating other patients. Critical care was necessary to treat or prevent imminent or life-threatening deterioration. Critical care was time spent personally by me on the following activities: development of treatment plan with patient and/or surrogate as well as nursing, discussions with consultants, evaluation of patient's response to treatment, examination of patient, obtaining history from patient or surrogate, ordering and performing treatments and interventions, ordering and review of laboratory studies, ordering and review of radiographic studies, pulse oximetry and re-evaluation of patient's condition.           Final Clinical Impression(s) / ED Diagnoses Final diagnoses:  Atrial fibrillation with RVR ([place])   End stage renal disease on dialysis [facility])    Rx / DC Orders ED Discharge Orders     None         Davonna Belling, MD 03/16/22 1700

## 2022-03-16 NOTE — ED Triage Notes (Signed)
Pt BIB [facility] from the dialysis center c/o CP and SHOB. Pt did not receive her full treatment d/t complaints. When EMS arrived Pt was in A-fib RVR, EMS was unable to get a line so no meds given. Pt states the only thing different today is she took her eliquis before dialysis instead of afterwards.

## 2022-03-16 NOTE — ED Provider Notes (Signed)
MSE was initiated and I personally evaluated the patient and placed orders (if any) at  2:55 PM on March 16, 2022.  Patient presents with acute chest pain, shortness of breath, and overall feeling poorly while at dialysis.  The symptoms have resolved.  Found to be in A-fib with RVR though she is asymptomatic to that currently.  I was able to obtain IV access and will order blood work/chest xray.  The patient appears stable so that the remainder of the MSE may be completed by another provider.  Angiocath insertion Performed by: Ephraim Hamburger  Consent: Verbal consent obtained. Risks and benefits: risks, benefits and alternatives were discussed Time out: Immediately prior to procedure a "time out" was called to verify the correct patient, procedure, equipment, support staff and site/side marked as required.  Preparation: Patient was prepped and draped in the usual sterile fashion.  Vein Location: right basilic  Ultrasound Guided  Gauge: 20  Normal blood return and flush without difficulty Patient tolerance: Patient tolerated the procedure well with no immediate complications.     Sherwood Gambler, MD 03/16/22 [phone]

## 2022-03-16 NOTE — H&P (Cosign Needed Addendum)
Date: 03/16/2022               Patient Name:  Alicia [name] MRN: [id number]  DOB: 11-16-1955 Age / Sex: 66 y.o., female   PCP: Juanda Chance         Medical Service: Internal Medicine Teaching Service         Attending Physician: Dr. Lucious Groves, DO    First Contact: [place]well Nickel, MD      Pager: [phone]      Second Contact: Idamae Schuller, MD      Pager: [place]           After Hours (After 5p/  First Contact Pager: [phone]  weekends / holidays): Second Contact Pager: [phone]   SUBJECTIVE   Chief Complaint: sudden onset fatigue   History of Present Illness:  Alicia [name] is a 66 year old female with a past medical history of atrial fibrillation, ESRD on hemodialysis, alcoholic cirrhosis, hypertension, metastatic breast cancer in remission, and GERD who presents with chest pain that began during outpatient hemodialysis session.  She sates that she had a cold that developed this past weekend. Reprots some congestion and a cough. Coughs up a little bit of phlegm but not all day long. Has also noticed that her left ear with sometimes pop when she coughs.   Today, she went to dialysis. She was sitting getting dialysis and all of a sudden started feeling bad. Her blood pressure was dropping, which is not unusual for her. Described the sensation of feeling very tired and wanting to sleep. She was placed on a monitor and a nasal cannula, then dialysis staff called EMS to bring her to the hospital. She felt some transient dizziness when she stood up from the chair to move to the stretcher, but this resolved after getting on the stretcher. Denies any N/V, blurry vision, chest pain, SHOB, palpitations, fevers, chills, and abdominal pain.   Similar event occurred about a week and a half ago. Patient feels as though dialysis is causing her abnormal heart rhythm. This is a new development as of last month.     ED Course: Upon arrival to the ED, vitals were  notable for tachycardia. Laboratory testing demonstrated Trop 48, WBC 2.2, Hgb 9.5, and Plat 85. An ECG study revealed atrial fibrillation with RVR. Chest radiograph suggests CHF. Benzonatate was given and patient started on diltiazem drip.     Meds:  Current Meds  Medication Sig   acetaminophen (TYLENOL) 500 MG tablet Take 1,000 mg by mouth every 6 (six) hours as needed for moderate pain or headache.   amLODipine (NORVASC) 10 MG tablet Take 10 mg by mouth every evening.   apixaban (ELIQUIS) 2.5 MG TABS tablet Take 1 tablet (2.5 mg total) by mouth 2 (two) times daily.   aspirin-acetaminophen-caffeine (EXCEDRIN MIGRAINE) 250-250-65 MG tablet Take 2 tablets by mouth every 6 (six) hours as needed for headache.   Azelastine HCl 137 MCG/SPRAY SOLN Place into both nostrils.   benzonatate (TESSALON) 100 MG capsule Take 1 capsule (100 mg total) by mouth every 8 (eight) hours for 4 days.   calcitRIOL (ROCALTROL) 0.5 MCG capsule Take 0.5 mcg by mouth Every Tuesday,Thursday,and Saturday with dialysis.   Calcium Acetate 667 MG TABS Take 1,334 mg by mouth 3 (three) times daily with meals.   calcium carbonate (TUMS - DOSED IN MG ELEMENTAL CALCIUM) 500 MG chewable tablet Chew 2 tablets by mouth 3 (three) times daily as needed for indigestion or heartburn.  furosemide (LASIX) 40 MG tablet Take 40 mg by mouth as needed for fluid or edema.   gabapentin (NEURONTIN) 100 MG capsule Take 200 mg by mouth See admin instructions. Take 200 mg by mouth before dialysis on Tues/Thurs/Sat   guaiFENesin (MUCINEX) 600 MG 12 hr tablet Take 600 mg by mouth 2 (two) times daily as needed for to loosen phlegm.   hydrALAZINE (APRESOLINE) 100 MG tablet Take 100 mg by mouth 2 (two) times daily.   lidocaine-prilocaine (EMLA) cream Apply 1 Application topically as needed (dialysis).   loperamide (IMODIUM) 2 MG capsule Take 4 mg by mouth daily as needed for diarrhea or loose stools.   metoprolol succinate (TOPROL-XL) 25 MG 24 hr tablet  Take 1 tablet (25 mg total) by mouth daily. Take with or immediately following a meal. (Patient taking differently: Take 25 mg by mouth at bedtime. Take with or immediately following a meal.)   Phenylephrine-APAP-guaiFENesin (SINUS CONGESTION/PAIN DAYTIME) 5-325-200 MG TABS Take 1 tablet by mouth daily as needed (sinus issues.).   PRILOSEC OTC 20 MG tablet Take 20 mg by mouth daily as needed (for heartburn or reflux).   sodium chloride (OCEAN) 0.65 % SOLN nasal spray Place 1 spray into both nostrils as needed for congestion.   spironolactone (ALDACTONE) 25 MG tablet Take 25 mg by mouth as needed (fluid or edema).    Past Medical History  Past Surgical History:  Procedure Laterality Date   ABDOMINAL HYSTERECTOMY     AV FISTULA PLACEMENT Right 09/01/2020   Procedure: RIGHT ARM ARTERIOVENOUS 1st STAGE BASILIC;  Surgeon: Marty Heck, MD;  Location: [place];  Service: Vascular;  Laterality: Right;   AV FISTULA PLACEMENT Left 02/12/2021   Procedure: LEFT ARM FIRST STAGE BASILIC ARTERIOVENOUS (AV) FISTULA CREATION;  Surgeon: Marty Heck, MD;  Location: [place];  Service: Vascular;  Laterality: Left;   [place] Right 01/12/2021   Procedure: RIGHT ARM SECOND STAGE BASILIC VEIN TRANSPOSITION;  Surgeon: Marty Heck, MD;  Location: [place];  Service: Vascular;  Laterality: Right;   [place] Left 09/05/2021   Procedure: SECOND STAGE LEFT BASILIC VEIN TRANSPOSITION;  Surgeon: Marty Heck, MD;  Location: [place];  Service: Vascular;  Laterality: Left;   BREAST LUMPECTOMY     BREAST SURGERY     CESAREAN SECTION     COLONOSCOPY WITH PROPOFOL N/A 05/25/2019   Procedure: COLONOSCOPY WITH PROPOFOL;  Surgeon: Wilford Corner, MD;  Location: [facility];  Service: Endoscopy;  Laterality: N/A;   ESOPHAGOGASTRODUODENOSCOPY (EGD) WITH PROPOFOL N/A 11/21/2016   Procedure: ESOPHAGOGASTRODUODENOSCOPY;  Surgeon: Milus Banister, MD;  Location: [facility];  Service: Endoscopy;  Laterality: N/A;   ESOPHAGOGASTRODUODENOSCOPY (EGD) WITH PROPOFOL N/A 05/25/2019   Procedure: ESOPHAGOGASTRODUODENOSCOPY (EGD) WITH PROPOFOL;  Surgeon: Wilford Corner, MD;  Location: [facility];  Service: Endoscopy;  Laterality: N/A;   INSERTION OF DIALYSIS CATHETER Left 09/01/2020   Procedure: INSERTION OF A LEFT INTERNAL JUGULAR TUNNELLED DIALYSIS CATHETER. ULTRASOUND GUIDED;  Surgeon: Marty Heck, MD;  Location: [place];  Service: Vascular;  Laterality: Left;   IR PARACENTESIS  09/04/2016   IR PARACENTESIS  09/20/2016   IR PARACENTESIS  10/22/2016   IR PARACENTESIS  01/27/2017   IR PARACENTESIS  04/11/2017   IR PARACENTESIS  06/05/2017   IR PARACENTESIS  06/05/2017   IR PARACENTESIS  06/19/2017   IR PARACENTESIS  07/03/2017   IR PARACENTESIS  07/17/2017   IR PARACENTESIS  08/01/2017   IR PARACENTESIS  08/14/2017  IR PARACENTESIS  08/28/2017   IR PARACENTESIS  09/11/2017   IR PARACENTESIS  09/25/2017   IR PARACENTESIS  10/08/2017   IR PARACENTESIS  10/23/2017   IR PARACENTESIS  11/06/2017   IR PARACENTESIS  11/20/2017   IR PARACENTESIS  12/04/2017   IR PARACENTESIS  12/18/2017   IR PARACENTESIS  01/19/2018   IR PARACENTESIS  02/09/2018   IR PARACENTESIS  02/24/2018   IR PARACENTESIS  03/16/2018   IR PARACENTESIS  04/10/2018   IR PARACENTESIS  05/08/2018   IR PARACENTESIS  06/11/2018   IR PARACENTESIS  07/07/2018   IR PARACENTESIS  09/04/2018   IR PARACENTESIS  10/22/2018   IR PARACENTESIS  11/30/2018   IR PARACENTESIS  01/14/2019   IR PARACENTESIS  04/30/2019   IR PARACENTESIS  07/19/2019   IR PARACENTESIS  09/05/2020   IR PARACENTESIS  01/16/2022   IR PARACENTESIS  02/18/2022   IR RADIOLOGIST EVAL & MGMT  12/23/2017   LYMPH NODE DISSECTION     ULTRASOUND GUIDANCE FOR VASCULAR ACCESS Left 09/01/2020   Procedure: ULTRASOUND GUIDANCE FOR VASCULAR ACCESS;  Surgeon: Marty Heck, MD;  Location: [facility];  Service:  Vascular;  Laterality: Left;     Social:  Lives With: siblings  Occupation: disability Support: close family Level of Function: independent in all ADLs and IADLs PCP: [name] PA Substances: cigar maybe once per month or less, denies alcohol and any other recreational drugs including marijuana or cocaine    Family History:   Diabetes and hypertension on both sides ESRD in mother   Allergies: Allergies as of 03/16/2022 - Review Complete 03/16/2022  Allergen Reaction Noted   Penicillins Other (See Comments) 08/31/2021   Antivert [meclizine] Anxiety 08/07/2016   Compazine [prochlorperazine edisylate] Anxiety 08/07/2016   Flexeril [cyclobenzaprine] Anxiety 05/25/2019      Review of Systems: A complete ROS was negative except as per HPI.    OBJECTIVE:   Physical Exam: Blood pressure 124/79, pulse (!) 124, temperature 98.4 F (36.9 C), temperature source Oral, resp. rate 20, SpO2 92 %.   General:      awake and alert, lying comfortably in bed, cooperative, not in acute distress Skin:       warm and dry, intact without any obvious lesions or scars, no rashes Head:      normocephalic and atraumatic, oral mucosa moist Eyes:      extraocular movements intact, conjunctivae pink, pupils round, mild scleral icterus, no periorbital swelling Ears:       pinnae normal, no discharge or external lesions Nose:      symmetrical, no external lesions or discharge Lungs:      normal respiratory effort, breathing unlabored, symmetrical chest rise, diminished breath sounds especially in lower fields bilaterally, no crackles or wheezing Cardiac:      regular rate with irregular rhythm, normal S1 and S2, no pitting edema Abdomen:      soft and moderately distended, tympany with percussion, no tenderness to palpation or guarding Neurologic:      oriented to person-place-time, moving all extremities, no gross focal deficits Psychiatric:      euthymic mood with congruent affect,  intelligible speech   Labs: CBC    Component Value Date/Time   WBC 2.2 (L) 03/16/2022 1519   RBC 3.35 (L) 03/16/2022 1519   HGB 9.5 (L) 03/16/2022 1519   HCT 30.7 (L) 03/16/2022 1519   PLT 85 (L) 03/16/2022 1519   MCV 91.6 03/16/2022 1519   MCH 28.4 03/16/2022 1519  MCHC 30.9 03/16/2022 1519   RDW 14.7 03/16/2022 1519   LYMPHSABS 0.4 (L) 03/16/2022 1519   MONOABS 0.2 03/16/2022 1519   EOSABS 0.0 03/16/2022 1519   BASOSABS 0.0 03/16/2022 1519     CMP     Component Value Date/Time   NA 135 03/16/2022 1519   K 3.7 03/16/2022 1519   CL 93 (L) 03/16/2022 1519   CO2 28 03/16/2022 1519   GLUCOSE 92 03/16/2022 1519   BUN 11 03/16/2022 1519   CREATININE 3.71 (H) 03/16/2022 1519   CALCIUM 8.6 (L) 03/16/2022 1519   PROT 7.2 03/16/2022 1519   ALBUMIN 3.2 (L) 03/16/2022 1519   AST 30 03/16/2022 1519   ALT 15 03/16/2022 1519   ALKPHOS 84 03/16/2022 1519   BILITOT 0.6 03/16/2022 1519   GFRNONAA 13 (L) 03/16/2022 1519   GFRAA 36 (L) 11/08/2017 0536     Imaging:  DG Chest Portable 1 View Result Date: 03/16/2022 CLINICAL DATA:  chest pain, dialysis EXAM: PORTABLE CHEST 1 VIEW COMPARISON:  03/14/2022  FINDINGS: Cardiac silhouette is prominent. There is pulmonary interstitial prominence with vascular congestion. No focal consolidation. No pneumothorax or pleural effusion identified. Aorta is calcified. Right-sided Port-A-Cath tip overlies distal SVC.  IMPRESSION: Findings suggest CHF.     ECG: My personal interpretation is atrial fibrillation with rapid ventricular response, which is new from prior ECG on 12-7, both studies demonstrate prolonged QT interval    ASSESSMENT & PLAN:   Assessment & Plan by Problem: Principal Problem:   Atrial fibrillation with RVR (HCC) Active Problems:   Alcoholic cirrhosis of liver with ascites (HCC)   HTN (hypertension)   History of breast cancer   ESRD on dialysis (HCC)   Alicia [name] is a 66 y.o. person living with a history of  atrial fibrillation, ESRD on hemodialysis, alcoholic cirrhosis, hypertension, metastatic breast cancer in remission, and GERD who presents with sudden-onset fatigue that began during outpatient hemodialysis session, now admitted for management of atrial fibrillation with rapid ventricular response.    ---Atrial fibrillation with rapid ventricular response Patient has history of atrial fibrillation managed with metoprolol and apixaban at home. She suddenly developed fatigue during outpatient hemodialysis session on 12-9 and was found to have atrial fibrillation with RVR. Of note, she presented to the [facility] ED on 11-28 with similar symptoms. Upon arrival, laboratory tests notable for Trop 48 and BNP 309 with chest radiograph demonstrating a prominent cardiac silhouette. Etiology likely multifactorial including recent upper respiratory infection and physiologic stress of hemodialysis. Diltiazem drip was initiated for ventricular rate control, tachycardia has since resolved. > Diltiazem drip > Metoprolol 12.'5mg'$  q12 > Apixaban 2.'5mg'$  q12 > Cardiac telemetry > Check TSH > Check respiratory panel > Check urinalysis   ---End-stage renal disease on hemodialysis [facility] Patient has history of ESRD and receives hemodialysis per [facility] schedule. Onset of fatigue caused premature cessation of her hemodialysis session on 12-9. > Hemodialysis per [facility] schedule > Gabapentin '200mg'$  on [facility] > Calcitriol 0.5ug on [facility] > Calcium acetate '1334mg'$  q8 > Trend BMP q24   ---Pancytopenia Patient has history of thrombocytopenia dating back to at least 2022 per electronic medical record. Leukopenia is new as of 11-28. Upon arrival hemoglobin was 9.5, which is within her baseline of 9.0-12. Her thrombocytopenia is attributable to cirrhosis and anemia explained by ESRD. Etiology of leukopenia remains unclear at this time, malnutrition likely contributory. > Peripheral blood smear > Check HIV antibody >  Reticulocyte count > Trend CBC q24 > Consider hematology referral as  outpatient   ---Cirrhosis with ascites  Patient has history of cirrhosis complicated by ascites and undergoes paracentesis once per month, next scheduled session on 12-13. She takes furosemide and spironolactone as needed for excess fluid accumulation. Calculated MELD score is 22 points, indicating 19.6% three-month mortality rate. > Hold furosemide '40mg'$  PRN and spironolactone '25mg'$  PRN   ---Hypertension Patient has a history of hypertension managed at home with amlodipine. Blood pressure has been stable throughout current hospitalization. > Amlodipine '10mg'$  q24   ---Gastroesophageal reflux disease Patient has a history of GERD managed at home with calcium carbonate and omeprazole. > Calcium carbonate '500mg'$  q8 PRN > Omeprazole '20mg'$  q24   ---History of metastatic breast cancer, in remission Patient has history of metastatic breast cancer circa 1998. She currently follows with oncologist Dr Marylou Mccoy at [facility]. > Proceed with scheduled outpatient visit on 04-24-2022     Diet: Normal VTE: NOAC IVF: None,None Code: Full, declined prolonged lifesaving measures  Prior to Admission Living Arrangement: Home, living with family Anticipated Discharge Location: Home Barriers to Discharge: atrial fibrillation management  Dispo: Admit patient to Observation with expected length of stay less than 2 midnights.  Signed: Serita Butcher, MD Internal Medicine Resident PGY-1  03/16/2022, 7:29 PM

## 2022-03-16 NOTE — Hospital Course (Addendum)
  DAILY SUBJECTIVE:  12/11 [name]   ______________________________________________________________   HOSPITAL COURSE:   [name] is a 66 -year-old female with a past medical history of atrial fibrillation, ESRD on hemodialysis, alcoholic cirrhosis, hypertension, metastatic breast cancer in remission, and GERD who presents with sudden-onset fatigue that began during outpatient hemodialysis session, now admitted for management of atrial fibrillation with rapid ventricular response.         ---Atrial fibrillation with rapid ventricular response Patient has history of atrial fibrillation managed with metoprolol and apixaban at home. She suddenly developed fatigue during outpatient hemodialysis session on 12-9 and was found to have atrial fibrillation with RVR. Of note, she presented to the [facility] ED on 11-28 with similar symptoms. Upon arrival, laboratory tests notable for Trop 48 and BNP 309 with chest radiograph demonstrating a prominent cardiac silhouette. Etiology likely multifactorial including recent upper respiratory infection and physiologic stress of hemodialysis. Diltiazem drip was initiated for ventricular rate control, now replaced with metoprolol and tachycardia has resolved. > Metoprolol '50mg'$  q12 > Apixaban 2.'5mg'$  q12 > Cardiac telemetry     ---End-stage renal disease on hemodialysis [facility] Patient has history of ESRD and receives hemodialysis per [facility] schedule. Onset of fatigue caused premature cessation of her hemodialysis session on 12-9. Poor fluid management, which is complicated by cirrhotic ascites, is likely contributing to patient's decreased tolerance of hemodialysis. Next session scheduled for tomorrow 12-11, ideally after paracentesis which may improve tolerance. > Hemodialysis per [facility] schedule and on 12-11 > Gabapentin '200mg'$  on [facility] > Calcitriol 0.5ug on [facility] > Calcium acetate '1334mg'$  q8 > Trend BMP q24     ---Pancytopenia Patient has history of  thrombocytopenia dating back to at least 2022 per electronic medical record. Leukopenia is new as of 11-28. Upon arrival hemoglobin was 9.5, which is within her baseline of 9-12. Her thrombocytopenia is attributable to cirrhosis and anemia explained by ESRD. Etiology of leukopenia is likely recent viral infection, malnutrition possibly contributing as well. > Trend CBC q24     ---Cirrhosis with ascites  Patient has history of cirrhosis complicated by ascites and undergoes paracentesis once per month, next scheduled session on 12-13. She takes furosemide and spironolactone as needed for excess fluid accumulation. Calculated MELD score is 22 points, indicating 19.6% three-month mortality rate. Increased rate of fluid accumulation likely contributing to decreased hemodialysis tolerance.  > Paracentesis prior to next hemodialysis session > Hold furosemide '40mg'$  PRN and spironolactone '25mg'$  PRN     ---Hypertension Patient has a history of hypertension managed at home with amlodipine. Blood pressure has been high throughout hospitalization, possibly due to volume overload suggested by chest radiograph and presence of ascites. > Amlodipine '10mg'$  q24     ---Gastroesophageal reflux disease Patient has a history of GERD managed at home with calcium carbonate and omeprazole. > Pantoprazole '40mg'$  q24     ---History of metastatic breast cancer, in remission Patient has history of metastatic breast cancer circa 1998, currently in remission. She follows with oncologist Dr Marylou Mccoy at [facility]. > Proceed with scheduled outpatient visit on 04-24-2022    12/13 Patient reports that she is doing well and eating breakfast on my exam. Patient states that she was taking her eliquis wrong and that she now knows she has to take it two times as day. Patient denies any chest pain or shortness of breath.

## 2022-03-16 NOTE — ED Notes (Signed)
Admitting team  at the bedside cardizem to continue until tomorrow am per the doctors at  the bedside

## 2022-03-16 NOTE — ED Notes (Signed)
The pt is no longer in [facility]

## 2022-03-16 NOTE — Consult Note (Signed)
Cardiology Consultation   Patient ID: [name] MRN: [id number]; DOB: Sep 01, 1955  Admit date: 03/16/2022 Date of Consult: 03/16/2022  PCP:  Juanda Chance   [facility] Cardiologist:  Janina Mayo, MD        Patient Profile:   Alicia [name] is a 66 y.o. female with a hx of paroxysmal atrial fibrillation, breast cancer (stage III R breast cancer '92 s/p surgical resection and adjuvant chemo/XRT, recurrence with metastatic triple negative BC 6/11/2021on Taxol which was held 2/2 CKD 10/15/2019-followed at [facility]), hx of DVT however eliquis stopped 2/2 anemia and cirrhosis esophageal varices and portal hypertension with recurrent paracentesis, ESRD on IHD Tue Thurs Saturday (right basilic vein fistula has potentially thrombosed, s/p L A-V fistula 40/9811), alcoholic cirrhosis who is being seen 03/16/2022 for the evaluation of atrial fibrillation with rapid ventricular response.  History of Present Illness:   Alicia [name] states that she was in her usual state of health today.  She presented to routine dialysis and reports that she was told that her heart rate was going fast.  She did not complete a full dialysis session, states that she was only dialyzed for around 1.5 hours.  Did not feel the tachypalpitations.  Historically, she also does not feel her tachypalpitations.  States that she has had a nonproductive cough recently though this is pretty common for her.  Denies any recent fever/chills, nausea, vomiting, diarrhea.  Has had a nonproductive cough recently.  Does not feel that she is volume overloaded at this time.  She has not had problems with hypotension related to dialysis sessions recently.  She undergoes monthly paracenteses for known cirrhosis with a next due within a week.  She has a history of paroxysmal atrial fibrillation which typically responds quickly to diltiazem.  She sees Dr. Phineas Inches in the outpatient setting for her paroxysmal  atrial fibrillation.  She does not take any rhythm control agents.  Rate control with metoprolol daily.  Also takes apixaban 2.5 mg daily.  On my interview with her today, she states that sometimes she takes both 2.5 mg pills in the morning instead of as prescribed 2.5 mg twice daily.  Has not had any recent bleeding events including hematochezia, melena, emesis.  On arrival to the emergency room, she was tachycardic with an irregular rhythm which was confirmed on EKG.  She is not in any respiratory distress or hypoxic or febrile.  She is pancytopenic though with counts stable from prior.  Platelets 85.  No significant electrolyte derangements.  LFTs normal.  First high-sensitivity troponin 48 which is stable from prior.  CXR with vascular congestion.  She was started on IV diltiazem drip and at the time of my evaluation was back in normal sinus rhythm with heart rates in the 70s.   Past Medical History:  Diagnosis Date   Anemia    Anxiety    Ascites    Breast cancer ([place])    mets   Cancer ([place])    stomach   Chronic anticoagulation    reported on for DVT prophylaxis, possibly in setting of cancer, but Eliquis d/c'd 08/2016 in setting of anemia/liver disease/acites   Cirrhosis ([place])    CKD (chronic kidney disease), stage III ([place])    Dailysis T-TH-S   Dyspnea    with fluid buildup, no current problems 01/10/21   Esophageal varices ([place]) 11/2016   Small noted on ENDO   GERD (gastroesophageal reflux disease)    Headache  otc med prn   History of blood transfusion 08/2020   Hypertension    Hypotension    Pneumonia    Portal hypertension ([place])     Past Surgical History:  Procedure Laterality Date   ABDOMINAL HYSTERECTOMY     AV FISTULA PLACEMENT Right 09/01/2020   Procedure: RIGHT ARM ARTERIOVENOUS 1st STAGE BASILIC;  Surgeon: Marty Heck, MD;  Location: [place];  Service: Vascular;  Laterality: Right;   AV FISTULA PLACEMENT Left 02/12/2021   Procedure: LEFT ARM FIRST STAGE  BASILIC ARTERIOVENOUS (AV) FISTULA CREATION;  Surgeon: Marty Heck, MD;  Location: [place];  Service: Vascular;  Laterality: Left;   [place] Right 01/12/2021   Procedure: RIGHT ARM SECOND STAGE BASILIC VEIN TRANSPOSITION;  Surgeon: Marty Heck, MD;  Location: [place];  Service: Vascular;  Laterality: Right;   [place] Left 09/05/2021   Procedure: SECOND STAGE LEFT BASILIC VEIN TRANSPOSITION;  Surgeon: Marty Heck, MD;  Location: [place];  Service: Vascular;  Laterality: Left;   BREAST LUMPECTOMY     BREAST SURGERY     CESAREAN SECTION     COLONOSCOPY WITH PROPOFOL N/A 05/25/2019   Procedure: COLONOSCOPY WITH PROPOFOL;  Surgeon: Wilford Corner, MD;  Location: [facility];  Service: Endoscopy;  Laterality: N/A;   ESOPHAGOGASTRODUODENOSCOPY (EGD) WITH PROPOFOL N/A 11/21/2016   Procedure: ESOPHAGOGASTRODUODENOSCOPY;  Surgeon: Milus Banister, MD;  Location: [facility];  Service: Endoscopy;  Laterality: N/A;   ESOPHAGOGASTRODUODENOSCOPY (EGD) WITH PROPOFOL N/A 05/25/2019   Procedure: ESOPHAGOGASTRODUODENOSCOPY (EGD) WITH PROPOFOL;  Surgeon: Wilford Corner, MD;  Location: [facility];  Service: Endoscopy;  Laterality: N/A;   INSERTION OF DIALYSIS CATHETER Left 09/01/2020   Procedure: INSERTION OF A LEFT INTERNAL JUGULAR TUNNELLED DIALYSIS CATHETER. ULTRASOUND GUIDED;  Surgeon: Marty Heck, MD;  Location: [place];  Service: Vascular;  Laterality: Left;   IR PARACENTESIS  09/04/2016   IR PARACENTESIS  09/20/2016   IR PARACENTESIS  10/22/2016   IR PARACENTESIS  01/27/2017   IR PARACENTESIS  04/11/2017   IR PARACENTESIS  06/05/2017   IR PARACENTESIS  06/05/2017   IR PARACENTESIS  06/19/2017   IR PARACENTESIS  07/03/2017   IR PARACENTESIS  07/17/2017   IR PARACENTESIS  08/01/2017   IR PARACENTESIS  08/14/2017   IR PARACENTESIS  08/28/2017   IR PARACENTESIS  09/11/2017   IR PARACENTESIS  09/25/2017   IR PARACENTESIS   10/08/2017   IR PARACENTESIS  10/23/2017   IR PARACENTESIS  11/06/2017   IR PARACENTESIS  11/20/2017   IR PARACENTESIS  12/04/2017   IR PARACENTESIS  12/18/2017   IR PARACENTESIS  01/19/2018   IR PARACENTESIS  02/09/2018   IR PARACENTESIS  02/24/2018   IR PARACENTESIS  03/16/2018   IR PARACENTESIS  04/10/2018   IR PARACENTESIS  05/08/2018   IR PARACENTESIS  06/11/2018   IR PARACENTESIS  07/07/2018   IR PARACENTESIS  09/04/2018   IR PARACENTESIS  10/22/2018   IR PARACENTESIS  11/30/2018   IR PARACENTESIS  01/14/2019   IR PARACENTESIS  04/30/2019   IR PARACENTESIS  07/19/2019   IR PARACENTESIS  09/05/2020   IR PARACENTESIS  01/16/2022   IR PARACENTESIS  02/18/2022   IR RADIOLOGIST EVAL & MGMT  12/23/2017   LYMPH NODE DISSECTION     ULTRASOUND GUIDANCE FOR VASCULAR ACCESS Left 09/01/2020   Procedure: ULTRASOUND GUIDANCE FOR VASCULAR ACCESS;  Surgeon: Marty Heck, MD;  Location: [place];  Service: Vascular;  Laterality: Left;  Home Medications:  Prior to Admission medications   Medication Sig Start Date End Date Taking? Authorizing Provider  acetaminophen (TYLENOL) 500 MG tablet Take 1,000 mg by mouth every 6 (six) hours as needed for moderate pain or headache.   Yes [provider]  amLODipine (NORVASC) 10 MG tablet Take 10 mg by mouth every evening. 05/14/20  Yes [provider]  apixaban (ELIQUIS) 2.5 MG TABS tablet Take 1 tablet (2.5 mg total) by mouth 2 (two) times daily. 12/14/21  Yes Janina Mayo, MD  aspirin-acetaminophen-caffeine (EXCEDRIN MIGRAINE) [phone] MG tablet Take 2 tablets by mouth every 6 (six) hours as needed for headache.   Yes [provider]  Azelastine HCl 137 MCG/SPRAY SOLN Place into both nostrils. 03/13/22  Yes [provider]  benzonatate (TESSALON) 100 MG capsule Take 1 capsule (100 mg total) by mouth every 8 (eight) hours for 4 days. 03/15/22 03/19/22 Yes Marcello Fennel, PA-C  calcitRIOL (ROCALTROL) 0.5  MCG capsule Take 0.5 mcg by mouth Every Tuesday,Thursday,and Saturday with dialysis.   Yes [provider]  Calcium Acetate 667 MG TABS Take 1,334 mg by mouth 3 (three) times daily with meals.   Yes [provider]  calcium carbonate (TUMS - DOSED IN MG ELEMENTAL CALCIUM) 500 MG chewable tablet Chew 2 tablets by mouth 3 (three) times daily as needed for indigestion or heartburn.   Yes [provider]  furosemide (LASIX) 40 MG tablet Take 40 mg by mouth as needed for fluid or edema.   Yes [provider]  gabapentin (NEURONTIN) 100 MG capsule Take 200 mg by mouth See admin instructions. Take 200 mg by mouth before dialysis on Tues/Thurs/Sat 05/23/21  Yes [provider]  guaiFENesin (MUCINEX) 600 MG 12 hr tablet Take 600 mg by mouth 2 (two) times daily as needed for to loosen phlegm. 03/13/22 03/13/23 Yes [provider]  hydrALAZINE (APRESOLINE) 100 MG tablet Take 100 mg by mouth 2 (two) times daily. 05/16/20  Yes [provider]  lidocaine-prilocaine (EMLA) cream Apply 1 Application topically as needed (dialysis).   Yes [provider]  loperamide (IMODIUM) 2 MG capsule Take 4 mg by mouth daily as needed for diarrhea or loose stools.   Yes [provider]  metoprolol succinate (TOPROL-XL) 25 MG 24 hr tablet Take 1 tablet (25 mg total) by mouth daily. Take with or immediately following a meal. Patient taking differently: Take 25 mg by mouth at bedtime. Take with or immediately following a meal. 07/25/21  Yes Branch, Royetta Crochet, MD  Phenylephrine-APAP-guaiFENesin (SINUS CONGESTION/PAIN DAYTIME) 5-325-200 MG TABS Take 1 tablet by mouth daily as needed (sinus issues.).   Yes [provider]  PRILOSEC OTC 20 MG tablet Take 20 mg by mouth daily as needed (for heartburn or reflux).   Yes [provider]  sodium chloride (OCEAN) 0.65 % SOLN nasal spray Place 1 spray into both nostrils as needed for congestion.   Yes  [provider]  spironolactone (ALDACTONE) 25 MG tablet Take 25 mg by mouth as needed (fluid or edema). 08/29/20  Yes [provider]  oxyCODONE (ROXICODONE) 5 MG immediate release tablet Take 1 tablet (5 mg total) by mouth every 6 (six) hours as needed for up to 10 doses for breakthrough pain. Patient not taking: Reported on 03/05/2022 10/13/21   Lennice Sites, DO    Inpatient Medications: Scheduled Meds:  Continuous Infusions:  diltiazem (CARDIZEM) infusion 5 mg/hr (03/16/22 1609)   PRN Meds:   Allergies:  Allergies  Allergen Reactions   Penicillins Other (See Comments)    Childhood reaction    Antivert [Meclizine] Anxiety   Compazine [Prochlorperazine Edisylate] Anxiety   Flexeril [Cyclobenzaprine] Anxiety    Social History:   Social History   Socioeconomic History   Marital status: Single    Spouse name: Not on file   Number of children: Not on file   Years of education: Not on file   Highest education level: Not on file  Occupational History   Not on file  Tobacco Use   Smoking status: Former    Types: Cigars   Smokeless tobacco: Never   Tobacco comments:    Cigar 2-3 times a year   Vaping Use   Vaping Use: Never used  Substance and Sexual Activity   Alcohol use: Not Currently    Comment: occassional wine, no ETOH since 11/2020   Drug use: No   Sexual activity: Not on file    Comment: Hysterectomy  Other Topics Concern   Not on file  Social History Narrative   Not on file   Social Determinants of Health   Financial Resource Strain: Not on file  Food Insecurity: Not on file  Transportation Needs: Not on file  Physical Activity: Not on file  Stress: Not on file  Social Connections: Not on file  Intimate Partner Violence: Not on file    Family History:    Family History  Problem Relation Age of Onset   Diabetes Mother    Diabetes Father    Diabetes Brother    Colon cancer Neg Hx    Stomach cancer Neg Hx    Esophageal  cancer Neg Hx      ROS:  Please see the history of present illness.   All other ROS reviewed and negative.     Physical Exam/Data:   Vitals:   03/16/22 1424 03/16/22 1504 03/16/22 1515 03/16/22 1648  BP: 120/87 119/72 (!) 128/114 124/79  Pulse: (!) 143 (!) 112 (!) 49 (!) 124  Resp: 19 (!) '21 18 20  '$ Temp: 98.4 F (36.9 C)     TempSrc: Oral     SpO2: 93% 96% 94% 92%   No intake or output data in the 24 hours ending 03/16/22 1713    03/05/2022    4:38 PM 10/15/2021    9:40 AM 10/13/2021    2:20 AM  Last 3 Weights  Weight (lbs) 154 lb 165 lb 165 lb  Weight (kg) 69.854 kg 74.844 kg 74.844 kg     There is no height or weight on file to calculate BMI.  General: No acute distress HEENT: normal Neck: no JVD Vascular: No carotid bruits; Distal pulses 2+ bilaterally Cardiac:  normal S1, S2; normal rate and regular rhythm, flow murmur heard throughout systole and diastole consistent with murmur secondary to fistula; second murmur heard in systole heard best at right upper sternal border, 2/6 Lungs: Rales in the bilateral bases Abd: Distended abdomen with positive fluid wave, but nontender Ext: no edema Musculoskeletal: Left upper extremity fistula with dressing in place Skin: warm and dry  Neuro:  CNs 2-12 intact, no focal abnormalities noted Psych:  Normal affect   EKG:  The EKG was personally reviewed and demonstrates: Fibrillation with rapid ventricular response Telemetry:  Telemetry was personally reviewed and demonstrates: Version to normal sinus rhythm  Relevant CV Studies: 05/28/2021 TTE IMPRESSIONS     1. Left ventricular ejection fraction, by estimation, is 60 to 65%. Left  ventricular ejection  fraction by 3D volume is 60 %. The left ventricle has  normal function. The left ventricle has no regional wall motion  abnormalities. There is moderate left  ventricular hypertrophy. Left ventricular diastolic parameters are  consistent with Grade II diastolic dysfunction  (pseudonormalization).  Elevated left atrial pressure.   2. Right ventricular systolic function is normal. The right ventricular  size is normal. Tricuspid regurgitation signal is inadequate for assessing  PA pressure.   3. Left atrial size was moderately dilated.   4. The mitral valve is degenerative. Trivial mitral valve regurgitation.   5. The aortic valve is tricuspid. Aortic valve regurgitation is not  visualized. Aortic valve sclerosis is present, with no evidence of aortic  valve stenosis.   6. The inferior vena cava is normal in size with greater than 50%  respiratory variability, suggesting right atrial pressure of 3 mmHg.   Laboratory Data:  High Sensitivity Troponin:   Recent Labs  Lab 03/05/22 1708 03/05/22 1820 03/16/22 1519  TROPONINIHS 24* 42* 48*     Chemistry Recent Labs  Lab 03/14/22 1507 03/16/22 1519  NA 135 135  K 3.4* 3.7  CL 92* 93*  CO2 30 28  GLUCOSE 88 92  BUN 9 11  CREATININE 3.34* 3.71*  CALCIUM 7.9* 8.6*  MG  --  1.9  GFRNONAA 15* 13*  ANIONGAP 13 14    Recent Labs  Lab 03/16/22 1519  PROT 7.2  ALBUMIN 3.2*  AST 30  ALT 15  ALKPHOS 84  BILITOT 0.6   Lipids No results for input(s): "CHOL", "TRIG", "HDL", "LABVLDL", "LDLCALC", "CHOLHDL" in the last 168 hours.  Hematology Recent Labs  Lab 03/14/22 1507 03/16/22 1519  WBC 1.8* 2.2*  RBC 3.43* 3.35*  HGB 9.6* 9.5*  HCT 31.1* 30.7*  MCV 90.7 91.6  MCH 28.0 28.4  MCHC 30.9 30.9  RDW 14.6 14.7  PLT 87* 85*   Thyroid No results for input(s): "TSH", "FREET4" in the last 168 hours.  BNP Recent Labs  Lab 03/14/22 1508  BNP 309.1*    DDimer No results for input(s): "DDIMER" in the last 168 hours.   Radiology/Studies:  DG Chest Portable 1 View  Result Date: 03/16/2022 CLINICAL DATA:  chest pain, dialysis EXAM: PORTABLE CHEST 1 VIEW COMPARISON:  03/14/2022 FINDINGS: Cardiac silhouette is prominent. There is pulmonary interstitial prominence with vascular congestion. No focal  consolidation. No pneumothorax or pleural effusion identified. Aorta is calcified. Right-sided Port-A-Cath tip overlies distal SVC. IMPRESSION: Findings suggest CHF. Electronically Signed   By: Sammie Bench M.D.   On: 03/16/2022 15:06   DG Chest 2 View  Result Date: 03/14/2022 CLINICAL DATA:  Shortness of breath. EXAM: CHEST - 2 VIEW COMPARISON:  March 05, 2022. FINDINGS: The heart size and mediastinal contours are within normal limits. Right internal jugular Port-A-Cath is unchanged in position. Both lungs are clear. The visualized skeletal structures are unremarkable. IMPRESSION: No active cardiopulmonary disease. Electronically Signed   By: Marijo Conception M.D.   On: 03/14/2022 16:48     Assessment and Plan:   Atrial fibrillation with rapid ventricular response, acute on chronic CHA2DS2-VASc at least 2.  Presents with atrial fibrillation without rapid ventricular response with heart rates in the 130s-140s persistently.  Generally speaking, she is relatively asymptomatic in regards to her atrial fibrillation.  Has known paroxysmal atrial fibrillation which responds robustly to diltiazem.  Taking metoprolol and apixaban at home.  No obvious precipitating etiologies, though does have cough which may be related to a  viral infection versus volume overload.  Last TTE in 05/2021 with dilated left atrium and preserved LVEF.  She was started on an IV diltiazem drip here in the emergency room and has since converted to normal sinus rhythm with heart rates in the 70s.  Safe to continue on IV diltiazem drip overnight for maintenance of normal sinus rhythm.  Will start fractionated metoprolol with plan to continue IV diltiazem in the morning assuming she stays in sinus rhythm.  Does not need antiarrhythmic medication at this time.  Counseled her on appropriate use of apixaban as she has been taking both 2.5 mg pills at a time instead of as prescribed, twice daily.  Would not stop anticoagulation.  Given  paroxysmal nature of atrial fibrillation, not a good candidate for cardioversion, especially in light of inappropriate anticoagulation administration at home recently.  Plan: - Rate control: IV diltiazem drip at 5 mg/h with end time of 12/10 at 7 AM; fractionated metoprolol tartrate 12.5 mg twice daily first dose now - Rhythm control: Pharmacologic therapy not needed at this time; would not pursue cardioversion if recurs as above unless unstable - Anticoagulation: Continue apixaban 2.5 mg twice daily, dose reduction is appropriate given her history of bleeding and previous risk-benefit discussions that have been had in the outpatient setting.  Hold anticoagulation if platelets drop below 50. - Exacerbating factors: Can consider respiratory viral panel; would not trend troponins   Risk Assessment/Risk Scores:          CHA2DS2-VASc Score = 2   This indicates a 2.2% annual risk of stroke. The patient's score is based upon: CHF History: 0 HTN History: 1 Diabetes History: 0 Stroke History: 0 Vascular Disease History: 0 Age Score: 0 Gender Score: 1          For questions or updates, please contact [place] Please consult [email or web address] for contact info under    Signed, Jeralene Huff, MD  03/16/2022 5:13 PM

## 2022-03-16 NOTE — Progress Notes (Signed)
HD graft assessment: Pt arrived to ED with clamps to de-accessed graft. Consult to remove clamps from LUE HD graft site. Clamps removed x2. No bleeding noted. Pt understands to notify staff if she notices any bleeding from site.

## 2022-03-17 DIAGNOSIS — Z90710 Acquired absence of both cervix and uterus: Secondary | ICD-10-CM | POA: Diagnosis not present

## 2022-03-17 DIAGNOSIS — I4891 Unspecified atrial fibrillation: Secondary | ICD-10-CM | POA: Diagnosis not present

## 2022-03-17 DIAGNOSIS — Z853 Personal history of malignant neoplasm of breast: Secondary | ICD-10-CM | POA: Diagnosis not present

## 2022-03-17 DIAGNOSIS — E1122 Type 2 diabetes mellitus with diabetic chronic kidney disease: Secondary | ICD-10-CM | POA: Diagnosis present

## 2022-03-17 DIAGNOSIS — K7031 Alcoholic cirrhosis of liver with ascites: Secondary | ICD-10-CM

## 2022-03-17 DIAGNOSIS — I480 Paroxysmal atrial fibrillation: Secondary | ICD-10-CM | POA: Diagnosis present

## 2022-03-17 DIAGNOSIS — N2581 Secondary hyperparathyroidism of renal origin: Secondary | ICD-10-CM | POA: Diagnosis present

## 2022-03-17 DIAGNOSIS — R059 Cough, unspecified: Secondary | ICD-10-CM | POA: Diagnosis present

## 2022-03-17 DIAGNOSIS — K7460 Unspecified cirrhosis of liver: Secondary | ICD-10-CM | POA: Diagnosis not present

## 2022-03-17 DIAGNOSIS — D61818 Other pancytopenia: Secondary | ICD-10-CM | POA: Diagnosis present

## 2022-03-17 DIAGNOSIS — I10 Essential (primary) hypertension: Secondary | ICD-10-CM

## 2022-03-17 DIAGNOSIS — R7989 Other specified abnormal findings of blood chemistry: Secondary | ICD-10-CM | POA: Diagnosis not present

## 2022-03-17 DIAGNOSIS — N186 End stage renal disease: Secondary | ICD-10-CM | POA: Diagnosis present

## 2022-03-17 DIAGNOSIS — D6869 Other thrombophilia: Secondary | ICD-10-CM | POA: Diagnosis present

## 2022-03-17 DIAGNOSIS — Z79899 Other long term (current) drug therapy: Secondary | ICD-10-CM | POA: Diagnosis not present

## 2022-03-17 DIAGNOSIS — Z992 Dependence on renal dialysis: Secondary | ICD-10-CM

## 2022-03-17 DIAGNOSIS — I120 Hypertensive chronic kidney disease with stage 5 chronic kidney disease or end stage renal disease: Secondary | ICD-10-CM | POA: Diagnosis present

## 2022-03-17 DIAGNOSIS — Z87891 Personal history of nicotine dependence: Secondary | ICD-10-CM | POA: Diagnosis not present

## 2022-03-17 DIAGNOSIS — Z8249 Family history of ischemic heart disease and other diseases of the circulatory system: Secondary | ICD-10-CM | POA: Diagnosis not present

## 2022-03-17 DIAGNOSIS — D696 Thrombocytopenia, unspecified: Secondary | ICD-10-CM | POA: Diagnosis not present

## 2022-03-17 DIAGNOSIS — M898X9 Other specified disorders of bone, unspecified site: Secondary | ICD-10-CM | POA: Diagnosis present

## 2022-03-17 DIAGNOSIS — Z7901 Long term (current) use of anticoagulants: Secondary | ICD-10-CM | POA: Diagnosis not present

## 2022-03-17 DIAGNOSIS — I2489 Other forms of acute ischemic heart disease: Secondary | ICD-10-CM | POA: Diagnosis present

## 2022-03-17 DIAGNOSIS — R079 Chest pain, unspecified: Secondary | ICD-10-CM | POA: Diagnosis not present

## 2022-03-17 DIAGNOSIS — Z20822 Contact with and (suspected) exposure to covid-19: Secondary | ICD-10-CM | POA: Diagnosis present

## 2022-03-17 DIAGNOSIS — Z8673 Personal history of transient ischemic attack (TIA), and cerebral infarction without residual deficits: Secondary | ICD-10-CM | POA: Diagnosis not present

## 2022-03-17 DIAGNOSIS — Z86718 Personal history of other venous thrombosis and embolism: Secondary | ICD-10-CM | POA: Diagnosis not present

## 2022-03-17 DIAGNOSIS — K219 Gastro-esophageal reflux disease without esophagitis: Secondary | ICD-10-CM | POA: Diagnosis present

## 2022-03-17 DIAGNOSIS — F419 Anxiety disorder, unspecified: Secondary | ICD-10-CM | POA: Diagnosis present

## 2022-03-17 DIAGNOSIS — D631 Anemia in chronic kidney disease: Secondary | ICD-10-CM | POA: Diagnosis present

## 2022-03-17 LAB — TECHNOLOGIST SMEAR REVIEW

## 2022-03-17 LAB — URINALYSIS, ROUTINE W REFLEX MICROSCOPIC
Bilirubin Urine: NEGATIVE
Glucose, UA: 50 mg/dL — AB
Ketones, ur: NEGATIVE mg/dL
Leukocytes,Ua: NEGATIVE
Nitrite: NEGATIVE
Protein, ur: 100 mg/dL — AB
Specific Gravity, Urine: 1.004 — ABNORMAL LOW (ref 1.005–1.030)
pH: 9 — ABNORMAL HIGH (ref 5.0–8.0)

## 2022-03-17 LAB — CBC WITH DIFFERENTIAL/PLATELET
Abs Immature Granulocytes: 0.01 10*3/uL (ref 0.00–0.07)
Basophils Absolute: 0 10*3/uL (ref 0.0–0.1)
Basophils Relative: 1 %
Eosinophils Absolute: 0.1 10*3/uL (ref 0.0–0.5)
Eosinophils Relative: 3 %
HCT: 32.6 % — ABNORMAL LOW (ref 36.0–46.0)
Hemoglobin: 10.1 g/dL — ABNORMAL LOW (ref 12.0–15.0)
Immature Granulocytes: 0 %
Lymphocytes Relative: 35 %
Lymphs Abs: 1.2 10*3/uL (ref 0.7–4.0)
MCH: 28.4 pg (ref 26.0–34.0)
MCHC: 31 g/dL (ref 30.0–36.0)
MCV: 91.6 fL (ref 80.0–100.0)
Monocytes Absolute: 0.3 10*3/uL (ref 0.1–1.0)
Monocytes Relative: 9 %
Neutro Abs: 1.9 10*3/uL (ref 1.7–7.7)
Neutrophils Relative %: 52 %
Platelets: 101 10*3/uL — ABNORMAL LOW (ref 150–400)
RBC: 3.56 MIL/uL — ABNORMAL LOW (ref 3.87–5.11)
RDW: 14.6 % (ref 11.5–15.5)
WBC: 3.5 10*3/uL — ABNORMAL LOW (ref 4.0–10.5)
nRBC: 0 % (ref 0.0–0.2)

## 2022-03-17 LAB — RENAL FUNCTION PANEL
Albumin: 3.1 g/dL — ABNORMAL LOW (ref 3.5–5.0)
Anion gap: 13 (ref 5–15)
BUN: 21 mg/dL (ref 8–23)
CO2: 31 mmol/L (ref 22–32)
Calcium: 9 mg/dL (ref 8.9–10.3)
Chloride: 91 mmol/L — ABNORMAL LOW (ref 98–111)
Creatinine, Ser: 4.71 mg/dL — ABNORMAL HIGH (ref 0.44–1.00)
GFR, Estimated: 10 mL/min — ABNORMAL LOW (ref >60)
Glucose, Bld: 114 mg/dL — ABNORMAL HIGH (ref 70–99)
Phosphorus: 4.5 mg/dL (ref 2.5–4.6)
Potassium: 4.8 mmol/L (ref 3.5–5.1)
Sodium: 135 mmol/L (ref 135–145)

## 2022-03-17 LAB — CBG MONITORING, ED: Glucose-Capillary: 96 mg/dL (ref 70–99)

## 2022-03-17 LAB — HEPATITIS B SURFACE ANTIGEN: Hepatitis B Surface Ag: NONREACTIVE

## 2022-03-17 LAB — PROTIME-INR
INR: 1.2 (ref 0.8–1.2)
Prothrombin Time: 15.2 seconds (ref 11.4–15.2)

## 2022-03-17 LAB — TROPONIN I (HIGH SENSITIVITY): Troponin I (High Sensitivity): 2090 ng/L (ref <18)

## 2022-03-17 MED ORDER — HYDRALAZINE HCL 50 MG PO TABS
100.0000 mg | ORAL_TABLET | Freq: Two times a day (BID) | ORAL | Status: DC
  Administered 2022-03-17 – 2022-03-20 (×5): 100 mg via ORAL
  Filled 2022-03-17 (×5): qty 2

## 2022-03-17 MED ORDER — METOPROLOL TARTRATE 50 MG PO TABS
50.0000 mg | ORAL_TABLET | Freq: Two times a day (BID) | ORAL | Status: DC
  Administered 2022-03-17 – 2022-03-20 (×4): 50 mg via ORAL
  Filled 2022-03-17 (×4): qty 1

## 2022-03-17 MED ORDER — CHLORHEXIDINE GLUCONATE CLOTH 2 % EX PADS: 6.0000 | MEDICATED_PAD | Freq: Every day | CUTANEOUS | Status: DC

## 2022-03-17 MED ORDER — GUAIFENESIN 100 MG/5ML PO LIQD
5.0000 mL | ORAL | Status: DC | PRN
  Administered 2022-03-17 – 2022-03-19 (×4): 5 mL via ORAL
  Filled 2022-03-17 (×4): qty 10

## 2022-03-17 MED ORDER — AMLODIPINE BESYLATE 10 MG PO TABS
10.0000 mg | ORAL_TABLET | Freq: Every evening | ORAL | Status: DC
  Administered 2022-03-17: 10 mg via ORAL
  Filled 2022-03-17: qty 1

## 2022-03-17 MED ORDER — BENZONATATE 100 MG PO CAPS
100.0000 mg | ORAL_CAPSULE | Freq: Three times a day (TID) | ORAL | Status: DC | PRN
  Administered 2022-03-18 – 2022-03-19 (×3): 100 mg via ORAL
  Filled 2022-03-17 (×3): qty 1

## 2022-03-17 MED ORDER — METOPROLOL TARTRATE 25 MG PO TABS: 25.0000 mg | ORAL_TABLET | Freq: Two times a day (BID) | ORAL | Status: DC

## 2022-03-17 MED ORDER — HYDRALAZINE HCL 25 MG PO TABS: 100.0000 mg | ORAL_TABLET | Freq: Two times a day (BID) | ORAL | Status: DC

## 2022-03-17 NOTE — Progress Notes (Signed)
Called by RN for critical lab value of troponin 2090 increased from 48 one day prior on 03/16/2022. I am unsure if this lab was delayed from admission but it does not appear to be ordered today. Per RN there is no CP,SOB,N/V. Repeat EKG was NSR with no ST elevations or depressions, no LBBB, and no general ST/T wave changes compared to prior EKGs. Will repeat troponins although I suspect this is all from demand ischemia in the setting of recent afib with RVR and hypertension to 211/96 with multiple readings around or above a systolic of 440. She has also only had partial HD the past 2 times also contributing to the elevation from lack of clearance. Will continue to monitor.

## 2022-03-17 NOTE — Progress Notes (Signed)
Subjective:  Patient reports feeling good this morning, less tired and cough improved compared to yesterday. Ate a significant portion of her breakfast. Denies palpitations, sweating, chills, and anxiety. Discussed plan to discharge once nephrology and cardiology have determined that it is okay.   Objective: Vitals over previous 24hr: Vitals:   03/17/22 0700 03/17/22 0750 03/17/22 1030 03/17/22 1151  BP: (!) 155/84 (!) 142/105 (!) 184/87   Pulse: 81 71 78   Resp: (!) 23 (!) 27 19   Temp:  99.1 F (37.3 C)  98.8 F (37.1 C)  TempSrc:    Oral  SpO2: 99% 93% 92%    General:                       awake and alert, sitting comfortably on bed, cooperative, not in acute distress Skin:                             warm and dry, intact without any obvious lesions or scars, no rashes Head:                           normocephalic and atraumatic, oral mucosa moist Eyes:                            extraocular movements intact, conjunctivae pink, pupils round, mild scleral icterus, no periorbital swelling Ears:                             pinnae normal, no discharge or external lesions Nose:                            symmetrical, no external lesions or discharge Lungs:                          normal respiratory effort, breathing unlabored, symmetrical chest rise, diminished breath sounds especially in lower fields bilaterally, no crackles or wheezing Cardiac:                        regular rate and rhythm, normal S1 and S2, no pitting edema Abdomen:                     soft and moderately distended Neurologic:                   oriented to person-place-time, moving all extremities, no gross focal deficits Psychiatric:                   happy mood with congruent affect, intelligible speech   Assessment/Plan: Ms Stringfield is a 69 -year-old female with a past medical history of atrial fibrillation, ESRD on hemodialysis, alcoholic cirrhosis, hypertension, metastatic breast cancer in  remission, and GERD who presents with sudden-onset fatigue that began during outpatient hemodialysis session, now admitted for management of atrial fibrillation with rapid ventricular response.      ---Atrial fibrillation with rapid ventricular response Patient has history of atrial fibrillation managed with metoprolol and apixaban at home. She suddenly developed fatigue during outpatient hemodialysis session on 12-9 and was found to have atrial fibrillation with RVR. Of note, she presented to the [facility] ED on 11-28 with  similar symptoms. Upon arrival, laboratory tests notable for Trop 48 and BNP 309 with chest radiograph demonstrating a prominent cardiac silhouette. Etiology likely multifactorial including recent upper respiratory infection and physiologic stress of hemodialysis. Diltiazem drip was initiated for ventricular rate control, now replaced with metoprolol and tachycardia has resolved. > Metoprolol '50mg'$  q12 > Apixaban 2.'5mg'$  q12 > Cardiac telemetry     ---End-stage renal disease on hemodialysis [facility] Patient has history of ESRD and receives hemodialysis per [facility] schedule. Onset of fatigue caused premature cessation of her hemodialysis session on 12-9. Poor fluid management, which is complicated by cirrhotic ascites, is likely contributing to patient's decreased tolerance of hemodialysis. Next session scheduled for tomorrow 12-11, ideally after paracentesis which may improve tolerance. > Hemodialysis per [facility] schedule and on 12-11 > Gabapentin '200mg'$  on [facility] > Calcitriol 0.5ug on [facility]-Sa > Calcium acetate '1334mg'$  q8 > Trend BMP q24     ---Pancytopenia Patient has history of thrombocytopenia dating back to at least 2022 per electronic medical record. Leukopenia is new as of 11-28. Upon arrival hemoglobin was 9.5, which is within her baseline of 9-12. Her thrombocytopenia is attributable to cirrhosis and anemia explained by ESRD. Etiology of leukopenia is likely recent viral  infection, malnutrition possibly contributing as well. > Trend CBC q24     ---Cirrhosis with ascites  Patient has history of cirrhosis complicated by ascites and undergoes paracentesis once per month, next scheduled session on 12-13. She takes furosemide and spironolactone as needed for excess fluid accumulation. Calculated MELD score is 22 points, indicating 19.6% three-month mortality rate. Increased rate of fluid accumulation likely contributing to decreased hemodialysis tolerance.  > Paracentesis prior to next hemodialysis session > Hold furosemide '40mg'$  PRN and spironolactone '25mg'$  PRN     ---Hypertension Patient has a history of hypertension managed at home with amlodipine. Blood pressure has been high throughout hospitalization, possibly due to volume overload suggested by chest radiograph and presence of ascites. > Amlodipine '10mg'$  q24     ---Gastroesophageal reflux disease Patient has a history of GERD managed at home with calcium carbonate and omeprazole. > Pantoprazole '40mg'$  q24     ---History of metastatic breast cancer, in remission Patient has history of metastatic breast cancer circa 1998, currently in remission. She follows with oncologist Dr Marylou Mccoy at [facility]. > Proceed with scheduled outpatient visit on 04-24-2022    Principal Problem:   Atrial fibrillation with RVR ([place]) Active Problems:   Alcoholic cirrhosis of liver with ascites ([place])   HTN (hypertension)   History of breast cancer   ESRD on dialysis [facility])   Prior to Admission Living Arrangement: home with family Anticipated Discharge Location: home Barriers to Discharge: tolerance of hemodialysis Dispo: Anticipated discharge in approximately 1-2 day(s).    Roswell Nickel, MD Internal Medicine PGY-1 Pager [phone]  After 5pm on weekdays and 1pm on weekends: On Call pager [phone]

## 2022-03-17 NOTE — Progress Notes (Addendum)
Rounding Note    Patient Name: Alicia [name] Date of Encounter: 03/17/2022  [place] Cardiologist: Janina Mayo, MD   Subjective   Feels better since in SR, got sick at HD yesterday, cut HD short, after that, had the Afib. Is to get paracentesis on Wednesday, gets it once a month  Inpatient Medications    Scheduled Meds:  apixaban  2.5 mg Oral BID   [START ON 03/19/2022] calcitRIOL  0.5 mcg Oral Q T,Th,Sa-HD   calcium acetate  1,334 mg Oral TID WC   [START ON 03/19/2022] gabapentin  200 mg Oral Q T,Th,Sat-1800   metoprolol tartrate  12.5 mg Oral BID   pantoprazole  40 mg Oral Daily   Continuous Infusions:  PRN Meds: benzonatate, polyethylene glycol   Vital Signs    Vitals:   03/17/22 0600 03/17/22 0630 03/17/22 0700 03/17/22 0750  BP: (!) 146/58 (!) 150/71 (!) 155/84 (!) 142/105  Pulse: 70 75 81 71  Resp: (!) 22 18 (!) 23 (!) 27  Temp:    99.1 F (37.3 C)  TempSrc:      SpO2: 93% 92% 99% 93%    Intake/Output Summary (Last 24 hours) at 03/17/2022 0852 Last data filed at 03/17/2022 0400 Gross per 24 hour  Intake 299.25 ml  Output --  Net 299.25 ml      03/05/2022    4:38 PM 10/15/2021    9:40 AM 10/13/2021    2:20 AM  Last 3 Weights  Weight (lbs) 154 lb 165 lb 165 lb  Weight (kg) 69.854 kg 74.844 kg 74.844 kg      Telemetry    SR - Personally Reviewed  ECG    None today, 12/09 ECG is atrial fib, HR 139 - Personally Reviewed  Physical Exam   GEN: No acute distress.   Neck: mild JVD, R side has portacath Cardiac: RRR, no murmurs, rubs, or gallops.  Respiratory: Clear to auscultation bilaterally. GI: firm, nontender, distended  MS: No edema; No deformity. Neuro:  Nonfocal  Psych: Normal affect   Labs    High Sensitivity Troponin:   Recent Labs  Lab 03/05/22 1708 03/05/22 1820 03/16/22 1519  TROPONINIHS 24* 42* 48*     Chemistry Recent Labs  Lab 03/14/22 1507 03/16/22 1519 03/17/22 0408  NA 135 135 135  K  3.4* 3.7 4.8  CL 92* 93* 91*  CO2 '30 28 31  '$ GLUCOSE 88 92 114*  BUN '9 11 21  '$ CREATININE 3.34* 3.71* 4.71*  CALCIUM 7.9* 8.6* 9.0  MG  --  1.9  --   PROT  --  7.2  --   ALBUMIN  --  3.2* 3.1*  AST  --  30  --   ALT  --  15  --   ALKPHOS  --  84  --   BILITOT  --  0.6  --   GFRNONAA 15* 13* 10*  ANIONGAP '13 14 13    '$ Lipids No results for input(s): "CHOL", "TRIG", "HDL", "LABVLDL", "LDLCALC", "CHOLHDL" in the last 168 hours.  Hematology Recent Labs  Lab 03/14/22 1507 03/16/22 1519 03/16/22 2038 03/17/22 0408  WBC 1.8* 2.2*  --  3.5*  RBC 3.43* 3.35* 3.59* 3.56*  HGB 9.6* 9.5*  --  10.1*  HCT 31.1* 30.7*  --  32.6*  MCV 90.7 91.6  --  91.6  MCH 28.0 28.4  --  28.4  MCHC 30.9 30.9  --  31.0  RDW 14.6 14.7  --  14.6  PLT 87* 85*  --  101*   Thyroid  Recent Labs  Lab 03/16/22 2038  TSH 1.215    BNP Recent Labs  Lab 03/14/22 1508  BNP 309.1*    DDimer No results for input(s): "DDIMER" in the last 168 hours.   Radiology    DG Chest Portable 1 View  Result Date: 03/16/2022 CLINICAL DATA:  chest pain, dialysis EXAM: PORTABLE CHEST 1 VIEW COMPARISON:  03/14/2022 FINDINGS: Cardiac silhouette is prominent. There is pulmonary interstitial prominence with vascular congestion. No focal consolidation. No pneumothorax or pleural effusion identified. Aorta is calcified. Right-sided Port-A-Cath tip overlies distal SVC. IMPRESSION: Findings suggest CHF. Electronically Signed   By: Sammie Bench M.D.   On: 03/16/2022 15:06    Cardiac Studies   No new tests ordered  ECHO: 05/28/2021  IMPRESSIONS   1. Left ventricular ejection fraction, by estimation, is 60 to 65%. Left  ventricular ejection fraction by 3D volume is 60 %. The left ventricle has  normal function. The left ventricle has no regional wall motion  abnormalities. There is moderate left  ventricular hypertrophy. Left ventricular diastolic parameters are  consistent with Grade II diastolic dysfunction  (pseudonormalization).  Elevated left atrial pressure.   2. Right ventricular systolic function is normal. The right ventricular  size is normal. Tricuspid regurgitation signal is inadequate for assessing  PA pressure.   3. Left atrial size was moderately dilated.   4. The mitral valve is degenerative. Trivial mitral valve regurgitation.   5. The aortic valve is tricuspid. Aortic valve regurgitation is not  visualized. Aortic valve sclerosis is present, with no evidence of aortic  valve stenosis.   6. The inferior vena cava is normal in size with greater than 50%  respiratory variability, suggesting right atrial pressure of 3 mmHg.   Patient Profile     66 y.o. female with a hx of paroxysmal atrial fibrillation, breast cancer (stage III R breast cancer '92 s/p surgical resection and adjuvant chemo/XRT, recurrence with metastatic triple negative BC 6/11/2021on Taxol which was held 2/2 CKD 10/15/2019-followed at [facility]), hx of DVT however eliquis stopped 2/2 anemia and cirrhosis esophageal varices and portal hypertension with recurrent paracentesis, ESRD on IHD Tue Thurs Saturday (right basilic vein fistula has potentially thrombosed, s/p L A-V fistula [id number]), alcoholic cirrhosis, was admitted 12/09 with atrial fib, RVR.   Assessment & Plan    Atrial fib, RVR - Incorrectly taking apixaban, taking 2.5 x 2 in the morning. - however, takes hydralazine 100 mg bid and is compliant w/ that - says she can take the Eliquis BID and be consistent - is scared the Afib will happen again, discuss w/ MD if she can be put on Cardizem or does she need antiarrhythmic - per Dr Harl Bowie note 07/25/2021: Will cont eliquis and BB (Toprol XL 25 mg). In terms of AAD, amiodarone is not an ideal option with cirrhosis. Dofetilide/sotalol may be options.  - Toprol XL 25 mg is not a high dose, could increase it  2. ESRD on HD - incomplete HD 12/09, mgt per IM, +/- Nephrology  For questions or updates, please contact  [place] Please consult [email or web address] for contact info under        Signed, Rosaria Ferries, PA-C  03/17/2022, 8:52 AM    Patient seen and examined and agree with Rosaria Ferries, PA-C as detailed above.  In brief, the patient is a 66 year old female with history of paroxysmal atrial fibrillation, breast cancer (stage III R breast  cancer '92 s/p surgical resection and adjuvant chemo/XRT, recurrence with metastatic triple negative BC 6/11/2021on Taxol which was held 2/2 CKD 10/15/2019-followed at [facility]), history of DVT, and alcoholic cirrhosis complicated by esophageal varices and portal hypertension with recurrent paracentesis, ESRD on IHD Tue Thurs Saturday (right basilic vein fistula has potentially thrombosed, s/p L A-V fistula 02/2021), who presented with Afib with RVR for which Cardiology was consulted.   Converted to NSR with IV dilt. Now with HR in 70s and hypertensive. Will increase metop to '50mg'$  BID and arrange for follow-up in Afib clinic. Can add dilt if needed. AAD's limited due to ESRD and cirrhosis.   GEN: No acute distress.   Neck: No JVD Cardiac: RRR, 2/6 systolic murmur  Respiratory: Bibasilar crackles GI: Distended, soft MS: No edema; No deformity. LUE fistula Neuro:  Nonfocal  Psych: Normal affect    Plan: -Okay to discharge home from CV perspective -Will arrange for follow-up with Afib clinic -Discussed importance of taking apixaban 2.'5mg'$  BID (was taking 2 pills 1x/day) -Change to metop tartrate '50mg'$  BID; can up-titrate +/- add dilt if needed for additional rate control in the future -Limited AAD options given cirrhosis and ESRD -Volume management with HD  Gwyndolyn Kaufman, MD

## 2022-03-17 NOTE — ED Notes (Signed)
ED TO INPATIENT HANDOFF REPORT    S Name/Age/Gender Alicia [name] 66 y.o. female Room/Bed: 005C/005C  Code Status   Code Status: Full Code  Home/SNF/Other Home Patient oriented to: self, place, time, and situation Is this baseline? Yes   Triage Complete: Triage complete  Chief Complaint Atrial fibrillation with RVR ([place]) [I48.91]  Triage Note Pt BIB [facility] from the dialysis center c/o CP and SHOB. Pt did not receive her full treatment d/t complaints. When EMS arrived Pt was in A-fib RVR, EMS was unable to get a line so no meds given. Pt states the only thing different today is she took her eliquis before dialysis instead of afterwards.    Allergies Allergies  Allergen Reactions   Penicillins Other (See Comments)    Childhood reaction    Antivert [Meclizine] Anxiety   Compazine [Prochlorperazine Edisylate] Anxiety   Flexeril [Cyclobenzaprine] Anxiety    Level of Care/Admitting Diagnosis ED Disposition     ED Disposition  Admit   Condition  --   Comment  Hospital Area: [place] [[id number]]  Level of Care: Progressive [102]  Admit to Progressive based on following criteria: CARDIOVASCULAR & THORACIC of moderate stability with acute coronary syndrome symptoms/low risk myocardial infarction/hypertensive urgency/arrhythmias/heart failure potentially compromising stability and stable post cardiovascular intervention patients.  May place patient in observation at [facility] or [place] if equivalent level of care is available:: No  Covid Evaluation: Asymptomatic - no recent exposure (last 10 days) testing not required  Diagnosis: Atrial fibrillation with RVR [facility]) [[id number]]  Admitting Physician: Lucious Groves [[id number]]  Attending Physician: Lucious Groves [[id number]]          B Medical/Surgery History Past Medical History:  Diagnosis Date   Anemia    Anxiety    Ascites    Breast cancer ([place])    mets   Cancer ([place])    stomach   Chronic  anticoagulation    reported on for DVT prophylaxis, possibly in setting of cancer, but Eliquis d/c'd 08/2016 in setting of anemia/liver disease/acites   Cirrhosis ([place])    CKD (chronic kidney disease), stage III ([place])    Dailysis T-TH-S   Dyspnea    with fluid buildup, no current problems 01/10/21   Esophageal varices ([place]) 11/2016   Small noted on ENDO   GERD (gastroesophageal reflux disease)    Headache    otc med prn   History of blood transfusion 08/2020   Hypertension    Hypotension    Pneumonia    Portal hypertension ([place])    Past Surgical History:  Procedure Laterality Date   ABDOMINAL HYSTERECTOMY     AV FISTULA PLACEMENT Right 09/01/2020   Procedure: RIGHT ARM ARTERIOVENOUS 1st STAGE BASILIC;  Surgeon: Marty Heck, MD;  Location: [place];  Service: Vascular;  Laterality: Right;   AV FISTULA PLACEMENT Left 02/12/2021   Procedure: LEFT ARM FIRST STAGE BASILIC ARTERIOVENOUS (AV) FISTULA CREATION;  Surgeon: Marty Heck, MD;  Location: [place];  Service: Vascular;  Laterality: Left;   [place] Right 01/12/2021   Procedure: RIGHT ARM SECOND STAGE BASILIC VEIN TRANSPOSITION;  Surgeon: Marty Heck, MD;  Location: [place];  Service: Vascular;  Laterality: Right;   [place] Left 09/05/2021   Procedure: SECOND STAGE LEFT BASILIC VEIN TRANSPOSITION;  Surgeon: Marty Heck, MD;  Location[facility];  Service: Vascular;  Laterality: Left;   BREAST LUMPECTOMY     BREAST SURGERY     CESAREAN  SECTION     COLONOSCOPY WITH PROPOFOL N/A 05/25/2019   Procedure: COLONOSCOPY WITH PROPOFOL;  Surgeon: Wilford Corner, MD;  [facility];  Service: Endoscopy;  Laterality: N/A;   ESOPHAGOGASTRODUODENOSCOPY (EGD) WITH PROPOFOL N/A 11/21/2016   Procedure: ESOPHAGOGASTRODUODENOSCOPY;  Surgeon: Milus Banister, MD;  [facility];  Service: Endoscopy;  Laterality: N/A;   ESOPHAGOGASTRODUODENOSCOPY (EGD) WITH PROPOFOL N/A  05/25/2019   Procedure: ESOPHAGOGASTRODUODENOSCOPY (EGD) WITH PROPOFOL;  Surgeon: Wilford Corner, MD;  [facility];  Service: Endoscopy;  Laterality: N/A;   INSERTION OF DIALYSIS CATHETER Left 09/01/2020   Procedure: INSERTION OF A LEFT INTERNAL JUGULAR TUNNELLED DIALYSIS CATHETER. ULTRASOUND GUIDED;  Surgeon: Marty Heck, MD;  Location: [place];  Service: Vascular;  Laterality: Left;   IR PARACENTESIS  09/04/2016   IR PARACENTESIS  09/20/2016   IR PARACENTESIS  10/22/2016   IR PARACENTESIS  01/27/2017   IR PARACENTESIS  04/11/2017   IR PARACENTESIS  06/05/2017   IR PARACENTESIS  06/05/2017   IR PARACENTESIS  06/19/2017   IR PARACENTESIS  07/03/2017   IR PARACENTESIS  07/17/2017   IR PARACENTESIS  08/01/2017   IR PARACENTESIS  08/14/2017   IR PARACENTESIS  08/28/2017   IR PARACENTESIS  09/11/2017   IR PARACENTESIS  09/25/2017   IR PARACENTESIS  10/08/2017   IR PARACENTESIS  10/23/2017   IR PARACENTESIS  11/06/2017   IR PARACENTESIS  11/20/2017   IR PARACENTESIS  12/04/2017   IR PARACENTESIS  12/18/2017   IR PARACENTESIS  01/19/2018   IR PARACENTESIS  02/09/2018   IR PARACENTESIS  02/24/2018   IR PARACENTESIS  03/16/2018   IR PARACENTESIS  04/10/2018   IR PARACENTESIS  05/08/2018   IR PARACENTESIS  06/11/2018   IR PARACENTESIS  07/07/2018   IR PARACENTESIS  09/04/2018   IR PARACENTESIS  10/22/2018   IR PARACENTESIS  11/30/2018   IR PARACENTESIS  01/14/2019   IR PARACENTESIS  04/30/2019   IR PARACENTESIS  07/19/2019   IR PARACENTESIS  09/05/2020   IR PARACENTESIS  01/16/2022   IR PARACENTESIS  02/18/2022   IR RADIOLOGIST EVAL & MGMT  12/23/2017   LYMPH NODE DISSECTION     ULTRASOUND GUIDANCE FOR VASCULAR ACCESS Left 09/01/2020   Procedure: ULTRASOUND GUIDANCE FOR VASCULAR ACCESS;  Surgeon: Marty Heck, MD;  Location[facility] OR;  Service: Vascular;  Laterality: Left;     A IV Location/Drains/Wounds Patient Lines/Drains/Airways Status      Active Line/Drains/Airways     Name Placement date Placement time Site Days   Implanted Port Right Chest --  --  Chest  --   Peripheral IV 03/16/22 20 G Right Antecubital 03/16/22  1502  Antecubital  1   Peripheral IV 03/16/22 20 G Posterior;Right Forearm 03/16/22  2035  Forearm  1   Fistula / Graft Right Forearm Arteriovenous fistula 09/01/20  1055  Forearm  562   Fistula / Graft Right Upper arm Arteriovenous fistula 01/12/21  0815  Upper arm  429   Fistula / Graft Left Upper arm Arteriovenous fistula 09/05/21  1409  Upper arm  193   Hemodialysis Catheter Left Internal jugular Double lumen Temporary (Non-Tunneled) 09/01/20  1008  Internal jugular  562   Incision (Closed) 11/07/17 11/07/17  2040  -- 1591   Incision (Closed) 09/01/20 Arm Right 09/01/20  1109  -- 562   Incision (Closed) 09/01/20 Neck Left 09/01/20  1109  -- 562   Incision (Closed) 01/12/21 Arm Right 01/12/21  0817  -- 429  Incision (Closed) 02/12/21 Arm Left 02/12/21  0832  -- 398   Incision (Closed) 09/05/21 Arm Left 09/05/21  1331  -- 193            Intake/Output Last 24 hours  Intake/Output Summary (Last 24 hours) at 03/17/2022 1514 Last data filed at 03/17/2022 0400 Gross per 24 hour  Intake 299.25 ml  Output --  Net 299.25 ml    Labs/Imaging Results for orders placed or performed during the hospital encounter of 03/16/22 (from the past 48 hour(s))  Basic metabolic panel     Status: Abnormal   Collection Time: 03/16/22  3:19 PM  Result Value Ref Range   Sodium 135 135 - 145 mmol/L   Potassium 3.7 3.5 - 5.1 mmol/L   Chloride 93 (L) 98 - 111 mmol/L   CO2 28 22 - 32 mmol/L   Glucose, Bld 92 70 - 99 mg/dL    Comment: Glucose reference range applies only to samples taken after fasting for at least 8 hours.   BUN 11 8 - 23 mg/dL   Creatinine, Ser 3.71 (H) 0.44 - 1.00 mg/dL   Calcium 8.6 (L) 8.9 - 10.3 mg/dL   GFR, Estimated 13 (L) >60 mL/min    Comment: (NOTE) Calculated using the CKD-EPI Creatinine  Equation (2021)    Anion gap 14 5 - 15    Comment: Performed at [facility] [address], [place], [place] [address]  Troponin I (High Sensitivity)     Status: Abnormal   Collection Time: 03/16/22  3:19 PM  Result Value Ref Range   Troponin I (High Sensitivity) 48 (H) <18 ng/L    Comment: (NOTE) Elevated high sensitivity troponin I (hsTnI) values and significant  changes across serial measurements may suggest ACS but many other  chronic and acute conditions are known to elevate hsTnI results.  Refer to the "Links" section for chest pain algorithms and additional  guidance. Performed at [facility], [place] [address] [place], [place] [address]   CBC with Differential     Status: Abnormal   Collection Time: 03/16/22  3:19 PM  Result Value Ref Range   WBC 2.2 (L) 4.0 - 10.5 K/uL   RBC 3.35 (L) 3.87 - 5.11 MIL/uL   Hemoglobin 9.5 (L) 12.0 - 15.0 g/dL   HCT 30.7 (L) 36.0 - 46.0 %   MCV 91.6 80.0 - 100.0 fL   MCH 28.4 26.0 - 34.0 pg   MCHC 30.9 30.0 - 36.0 g/dL   RDW 14.7 11.5 - 15.5 %   Platelets 85 (L) 150 - 400 K/uL    Comment: Immature Platelet Fraction may be clinically indicated, consider ordering this additional test [id number] REPEATED TO VERIFY    nRBC 0.0 0.0 - 0.2 %   Neutrophils Relative % 69 %   Neutro Abs 1.5 (L) 1.7 - 7.7 K/uL   Lymphocytes Relative 20 %   Lymphs Abs 0.4 (L) 0.7 - 4.0 K/uL   Monocytes Relative 7 %   Monocytes Absolute 0.2 0.1 - 1.0 K/uL   Eosinophils Relative 2 %   Eosinophils Absolute 0.0 0.0 - 0.5 K/uL   Basophils Relative 1 %   Basophils Absolute 0.0 0.0 - 0.1 K/uL   Immature Granulocytes 1 %   Abs Immature Granulocytes 0.01 0.00 - 0.07 K/uL    Comment: Performed at [facility], [place] [address], [place], [place] [address]  Magnesium     Status: None   Collection Time: 03/16/22  3:19 PM  Result Value Ref Range   Magnesium 1.9 1.7 - 2.4 mg/dL    Comment: Performed at [facility], [place] [address]  [place], [place] [address]  Protime-INR     Status: None   Collection Time: 03/16/22  3:19 PM  Result Value Ref Range   Prothrombin Time 14.3 11.4 - 15.2 seconds   INR 1.1 0.8 - 1.2    Comment: (NOTE) INR goal varies based on device and disease states. Performed at [facility], [place] [address] [place], [place] [address]   Hepatic function panel     Status: Abnormal   Collection Time: 03/16/22  3:19 PM  Result Value Ref Range   Total Protein 7.2 6.5 - 8.1 g/dL   Albumin 3.2 (L) 3.5 - 5.0 g/dL   AST 30 15 - 41 U/L   ALT 15 0 - 44 U/L   Alkaline Phosphatase 84 38 - 126 U/L   Total Bilirubin 0.6 0.3 - 1.2 mg/dL   Bilirubin, Direct 0.2 0.0 - 0.2 mg/dL   Indirect Bilirubin 0.4 0.3 - 0.9 mg/dL    Comment: Performed at [facility] [address], [name], [place] [address]  Respiratory (~20 pathogens) panel by PCR     Status: None   Collection Time: 03/16/22  8:23 PM   Specimen: Nasopharyngeal Swab; Respiratory  Result Value Ref Range   Adenovirus NOT DETECTED NOT DETECTED   Coronavirus 229E NOT DETECTED NOT DETECTED    Comment: (NOTE) The Coronavirus on the Respiratory Panel, DOES NOT test for the novel  Coronavirus (2019 nCoV)    Coronavirus HKU1 NOT DETECTED NOT DETECTED   Coronavirus NL63 NOT DETECTED NOT DETECTED   Coronavirus OC43 NOT DETECTED NOT DETECTED   Metapneumovirus NOT DETECTED NOT DETECTED   Rhinovirus / Enterovirus NOT DETECTED NOT DETECTED   Influenza A NOT DETECTED NOT DETECTED   Influenza B NOT DETECTED NOT DETECTED   Parainfluenza Virus 1 NOT DETECTED NOT DETECTED   Parainfluenza Virus 2 NOT DETECTED NOT DETECTED   Parainfluenza Virus 3 NOT DETECTED NOT DETECTED   Parainfluenza Virus 4 NOT DETECTED NOT DETECTED   Respiratory Syncytial Virus NOT DETECTED NOT DETECTED   Bordetella pertussis NOT DETECTED NOT DETECTED   Bordetella Parapertussis NOT DETECTED NOT DETECTED   Chlamydophila pneumoniae NOT DETECTED NOT DETECTED   Mycoplasma pneumoniae NOT  DETECTED NOT DETECTED    Comment: Performed at [facility], [place] [address], [place] [place], [place] [address]  Resp Panel by RT-PCR (Flu A&B, Covid) Nasopharyngeal Swab     Status: None   Collection Time: 03/16/22  8:23 PM   Specimen: Nasopharyngeal Swab; Nasal Swab  Result Value Ref Range   SARS Coronavirus 2 by RT PCR NEGATIVE NEGATIVE    Comment: (NOTE) SARS-CoV-2 target nucleic acids are NOT DETECTED.  The SARS-CoV-2 RNA is generally detectable in upper respiratory specimens during the acute phase of infection. The lowest concentration of SARS-CoV-2 viral copies this assay can detect is 138 copies/mL. A negative result does not preclude SARS-Cov-2 infection and should not be used as the sole basis for treatment or other patient management decisions. A negative result may occur with  improper specimen collection/handling, submission of specimen other than nasopharyngeal swab, presence of viral mutation(s) within the areas targeted by this assay, and inadequate number of viral copies(<138 copies/mL). A negative result must be combined with clinical observations, patient history, and epidemiological information. The expected result is Negative.  Fact Sheet for Patients:  EntrepreneurPulse.com.au  Fact Sheet for Healthcare Providers:  IncredibleEmployment.be  This test is no t yet approved or cleared by the [place] FDA and  has been authorized for detection and/or diagnosis of SARS-CoV-2 by FDA under an Emergency Use Authorization (EUA). This EUA will remain  in effect (meaning this test can be used) for the duration of the COVID-19 declaration under Section 564(b)(1) of the Act, 21 U.S.C.section 360bbb-3(b)(1), unless the authorization is terminated  or revoked sooner.       Influenza A by PCR NEGATIVE NEGATIVE   Influenza B by PCR NEGATIVE NEGATIVE    Comment: (NOTE) The Xpert Xpress SARS-CoV-2/FLU/RSV plus assay is intended as an  aid in the diagnosis of influenza from Nasopharyngeal swab specimens and should not be used as a sole basis for treatment. Nasal washings and aspirates are unacceptable for Xpert Xpress SARS-CoV-2/FLU/RSV testing.  Fact Sheet for Patients: EntrepreneurPulse.com.au  Fact Sheet for Healthcare Providers: IncredibleEmployment.be  This test is not yet approved or cleared by the [place] FDA and has been authorized for detection and/or diagnosis of SARS-CoV-2 by FDA under an Emergency Use Authorization (EUA). This EUA will remain in effect (meaning this test can be used) for the duration of the COVID-19 declaration under Section 564(b)(1) of the Act, 21 U.S.C. section 360bbb-3(b)(1), unless the authorization is terminated or revoked.  Performed at [facility], [place] [address], [place], [place] [address]   HIV Antibody (routine testing w rflx)     Status: None   Collection Time: 03/16/22  8:38 PM  Result Value Ref Range   HIV Screen 4th Generation wRfx Non Reactive Non Reactive    Comment: Performed at [facility], [place] [address], [place], [place] [address]  TSH     Status: None   Collection Time: 03/16/22  8:38 PM  Result Value Ref Range   TSH 1.215 0.350 - 4.500 uIU/mL    Comment: Performed by a 3rd Generation assay with a functional sensitivity of <=0.01 uIU/mL. Performed at [facility], [place] [address] [place], [place] [address]   Reticulocytes     Status: Abnormal   Collection Time: 03/16/22  8:38 PM  Result Value Ref Range   Retic Ct Pct 0.7 0.4 - 3.1 %   RBC. 3.59 (L) 3.87 - 5.11 MIL/uL   Retic Count, Absolute 26.2 19.0 - 186.0 K/uL   Immature Retic Fract 2.4 2.3 - 15.9 %    Comment: Performed at [facility] [address] [place], [place] [address]  Urinalysis, Routine w reflex microscopic Urine, Clean Catch     Status: Abnormal   Collection Time: 03/17/22  4:05 AM  Result Value Ref Range   Color,  Urine STRAW (A) YELLOW   APPearance CLEAR CLEAR   Specific Gravity, Urine 1.004 (L) 1.005 - 1.030   pH 9.0 (H) 5.0 - 8.0   Glucose, UA 50 (A) NEGATIVE mg/dL   Hgb urine dipstick SMALL (A) NEGATIVE   Bilirubin Urine NEGATIVE NEGATIVE   Ketones, ur NEGATIVE NEGATIVE mg/dL   Protein, ur 100 (A) NEGATIVE mg/dL   Nitrite NEGATIVE NEGATIVE   Leukocytes,Ua NEGATIVE NEGATIVE   RBC / HPF 0-5 0 - 5 RBC/hpf   WBC, UA 0-5 0 - 5 WBC/hpf   Bacteria, UA RARE (A) NONE SEEN   Squamous Epithelial / LPF 6-10 0 - 5    Comment: Performed at [facility], [place] [address] [place], [place] [address]  Renal function panel  Status: Abnormal   Collection Time: 03/17/22  4:08 AM  Result Value Ref Range   Sodium 135 135 - 145 mmol/L   Potassium 4.8 3.5 - 5.1 mmol/L    Comment: HEMOLYSIS AT THIS LEVEL MAY AFFECT RESULT   Chloride 91 (L) 98 - 111 mmol/L   CO2 31 22 - 32 mmol/L   Glucose, Bld 114 (H) 70 - 99 mg/dL    Comment: Glucose reference range applies only to samples taken after fasting for at least 8 hours.   BUN 21 8 - 23 mg/dL   Creatinine, Ser 4.71 (H) 0.44 - 1.00 mg/dL   Calcium 9.0 8.9 - 10.3 mg/dL   Phosphorus 4.5 2.5 - 4.6 mg/dL   Albumin 3.1 (L) 3.5 - 5.0 g/dL   GFR, Estimated 10 (L) >60 mL/min    Comment: (NOTE) Calculated using the CKD-EPI Creatinine Equation (2021)    Anion gap 13 5 - 15    Comment: Performed at [facility] [address] [place], [place] [address]  Protime-INR     Status: None   Collection Time: 03/17/22  4:08 AM  Result Value Ref Range   Prothrombin Time 15.2 11.4 - 15.2 seconds   INR 1.2 0.8 - 1.2    Comment: (NOTE) INR goal varies based on device and disease states. Performed at [facility], [place] [address] [place], [place] [address]   Technologist smear review     Status: None   Collection Time: 03/17/22  4:08 AM  Result Value Ref Range   WBC MORPHOLOGY MORPHOLOGY UNREMARKABLE    RBC MORPHOLOGY HYPOCHROMIA    Plt Morphology GIANT  PLATELETS SEEN    Clinical Information      pancytopenia, does not appear to have an active infection. Hx of ESRD, AF, cirrhosis    Comment: Performed at [facility], [place] [address], [place], [place] [address]  CBC with Differential/Platelet     Status: Abnormal   Collection Time: 03/17/22  4:08 AM  Result Value Ref Range   WBC 3.5 (L) 4.0 - 10.5 K/uL   RBC 3.56 (L) 3.87 - 5.11 MIL/uL   Hemoglobin 10.1 (L) 12.0 - 15.0 g/dL   HCT 32.6 (L) 36.0 - 46.0 %   MCV 91.6 80.0 - 100.0 fL   MCH 28.4 26.0 - 34.0 pg   MCHC 31.0 30.0 - 36.0 g/dL   RDW 14.6 11.5 - 15.5 %   Platelets 101 (L) 150 - 400 K/uL    Comment: REPEATED TO VERIFY   nRBC 0.0 0.0 - 0.2 %   Neutrophils Relative % 52 %   Neutro Abs 1.9 1.7 - 7.7 K/uL   Lymphocytes Relative 35 %   Lymphs Abs 1.2 0.7 - 4.0 K/uL   Monocytes Relative 9 %   Monocytes Absolute 0.3 0.1 - 1.0 K/uL   Eosinophils Relative 3 %   Eosinophils Absolute 0.1 0.0 - 0.5 K/uL   Basophils Relative 1 %   Basophils Absolute 0.0 0.0 - 0.1 K/uL   Immature Granulocytes 0 %   Abs Immature Granulocytes 0.01 0.00 - 0.07 K/uL    Comment: Performed at [facility], [place] [address], [place], [place] [address]  CBG monitoring, ED     Status: None   Collection Time: 03/17/22  7:53 AM  Result Value Ref Range   Glucose-Capillary 96 70 - 99 mg/dL    Comment: Glucose reference range applies only to samples taken after fasting for at least 8  hours.   Comment 1 Notify RN    Comment 2 Document in Chart   Hepatitis B surface antigen     Status: None   Collection Time: 03/17/22 12:15 PM  Result Value Ref Range   Hepatitis B Surface Ag NON REACTIVE NON REACTIVE    Comment: Performed at [facility] [address] [place], [place] [address]   DG Chest Portable 1 View  Result Date: 03/16/2022 CLINICAL DATA:  chest pain, dialysis EXAM: PORTABLE CHEST 1 VIEW COMPARISON:  03/14/2022 FINDINGS: Cardiac silhouette is prominent. There is pulmonary interstitial  prominence with vascular congestion. No focal consolidation. No pneumothorax or pleural effusion identified. Aorta is calcified. Right-sided Port-A-Cath tip overlies distal SVC. IMPRESSION: Findings suggest CHF. Electronically Signed   By: Sammie Bench M.D.   On: 03/16/2022 15:06    Pending Labs Unresulted Labs (From admission, onward)     Start     Ordered   03/17/22 1150  Hepatitis B surface antibody,quantitative  (New Admission Hemo Labs (Hepatitis B))  Once,   R        03/17/22 1150            Vitals/Pain Today's Vitals   03/17/22 1345 03/17/22 1400 03/17/22 1415 03/17/22 1430  BP: (!) 174/83 (!) 188/77 (!) 200/100 (!) 188/81  Pulse: 82 80 92 86  Resp: 19 16 (!) 21 20  Temp:      TempSrc:      SpO2: 95% 95% 100% 96%  PainSc:        Isolation Precautions No active isolations  Medications Medications  diltiazem (CARDIZEM) 1 mg/mL load via infusion 10 mg (10 mg Intravenous Bolus from Bag 03/16/22 1608)    And  diltiazem (CARDIZEM) 125 mg in dextrose 5% 125 mL (1 mg/mL) infusion (0 mg/hr Intravenous Stopped 03/17/22 0816)  apixaban (ELIQUIS) tablet 2.5 mg (2.5 mg Oral Given 03/17/22 0917)  polyethylene glycol (MIRALAX / GLYCOLAX) packet 17 g (has no administration in time range)  calcitRIOL (ROCALTROL) capsule 0.5 mcg (has no administration in time range)  calcium acetate (PHOSLO) capsule 1,334 mg (1,334 mg Oral Given 03/17/22 1149)  gabapentin (NEURONTIN) capsule 200 mg (has no administration in time range)  pantoprazole (PROTONIX) EC tablet 40 mg (40 mg Oral Given 03/17/22 0918)  benzonatate (TESSALON) capsule 100 mg (has no administration in time range)  metoprolol tartrate (LOPRESSOR) tablet 50 mg (has no administration in time range)  amLODipine (NORVASC) tablet 10 mg (has no administration in time range)    Mobility walks Low fall risk   Focused Assessments Cardiac Assessment Handoff:  Cardiac Rhythm: Atrial fibrillation, Normal sinus rhythm No results  found for: "CKTOTAL", "CKMB", "CKMBINDEX", "TROPONINI" No results found for: "DDIMER" Does the Patient currently have chest pain? No    R Recommendations: See Admitting Provider Note

## 2022-03-17 NOTE — Discharge Summary (Incomplete)
Name: Alicia [name] MRN: [id number] DOB: 1955-04-10 66 y.o. PCP: Juanda Chance  Date of Admission: 03/16/2022  2:21 PM Date of Discharge: 03/20/2022 Attending Physician: Dr. Saverio Danker  Discharge Diagnosis: Principal Problem:   Atrial fibrillation with RVR ([place] [place]) Active Problems:   Alcoholic cirrhosis of liver with ascites ([place])   HTN (hypertension)   Cirrhosis of liver with ascites ([place])   History of breast cancer   End stage renal disease on dialysis [facility])     Discharge Medications: Allergies as of 03/20/2022       Reactions   Penicillins Other (See Comments)   Childhood reaction   Antivert [meclizine] Anxiety   Compazine [prochlorperazine Edisylate] Anxiety   Flexeril [cyclobenzaprine] Anxiety        Medication List     STOP taking these medications    benzonatate 100 MG capsule Commonly known as: TESSALON   furosemide 40 MG tablet Commonly known as: LASIX   metoprolol succinate 25 MG 24 hr tablet Commonly known as: TOPROL-XL   oxyCODONE 5 MG immediate release tablet Commonly known as: Roxicodone   spironolactone 25 MG tablet Commonly known as: ALDACTONE       TAKE these medications    acetaminophen 500 MG tablet Commonly known as: TYLENOL Take 1,000 mg by mouth every 6 (six) hours as needed for moderate pain or headache.   amLODipine 10 MG tablet Commonly known as: NORVASC Take 10 mg by mouth every evening.   apixaban 2.5 MG Tabs tablet Commonly known as: ELIQUIS Take 1 tablet (2.5 mg total) by mouth 2 (two) times daily.   aspirin-acetaminophen-caffeine 250-250-65 MG tablet Commonly known as: EXCEDRIN MIGRAINE Take 2 tablets by mouth every 6 (six) hours as needed for headache.   Azelastine HCl 137 MCG/SPRAY Soln Place into both nostrils.   calcitRIOL 0.5 MCG capsule Commonly known as: ROCALTROL Take 0.5 mcg by mouth Every Tuesday,Thursday,and Saturday with dialysis.   Calcium Acetate 667 MG Tabs Take 1,334 mg by mouth 3  (three) times daily with meals.   calcium carbonate 500 MG chewable tablet Commonly known as: TUMS - dosed in mg elemental calcium Chew 2 tablets by mouth 3 (three) times daily as needed for indigestion or heartburn.   gabapentin 100 MG capsule Commonly known as: NEURONTIN Take 200 mg by mouth See admin instructions. Take 200 mg by mouth before dialysis on Tues/Thurs/Sat   guaiFENesin 600 MG 12 hr tablet Commonly known as: MUCINEX Take 600 mg by mouth 2 (two) times daily as needed for to loosen phlegm.   hydrALAZINE 100 MG tablet Commonly known as: APRESOLINE Take 100 mg by mouth 2 (two) times daily.   lidocaine-prilocaine cream Commonly known as: EMLA Apply 1 Application topically as needed (dialysis).   loperamide 2 MG capsule Commonly known as: IMODIUM Take 4 mg by mouth daily as needed for diarrhea or loose stools.   metoprolol tartrate 50 MG tablet Commonly known as: LOPRESSOR Take 1 tablet (50 mg total) by mouth 2 (two) times daily.   PriLOSEC OTC 20 MG tablet Generic drug: omeprazole Take 20 mg by mouth daily as needed (for heartburn or reflux).   Sinus Congestion/Pain Daytime 5-325-200 MG Tabs Generic drug: Phenylephrine-APAP-guaiFENesin Take 1 tablet by mouth daily as needed (sinus issues.).   sodium chloride 0.65 % Soln nasal spray Commonly known as: OCEAN Place 1 spray into both nostrils as needed for congestion.         Disposition and follow-up:    Ms.Alicia [name] was discharged from [facility] in Stable condition.  At the hospital follow up visit please address:   1.  Modify antihypertensive/A-fib regimen as appropriate.  Regarding beta-blocker, she will be discharged on metoprolol tartrate 50 twice daily, but can hold morning metoprolol dose on dialysis days as needed.  Hydralazine frequency can also be increased if needed.  2.  Patient will continue current TTS HD schedule.  Please assess for dialysis and tolerance for  recurrent episodes of A-fib during dialysis.  Can consider sotalol or dofetilide in the future if needed.  3.  She was pancytopenic on admission with leukopenia spontaneously resolving, chronic thrombocytopenia and anemia.  Leukopenia was presumed due to recent viral infection.  Please continue to monitor CBC and if she develops recurrent pancytopenia, would consider hematology/oncology follow-up.  4. Labs / imaging needed at time of follow-up: CBC, BMP, EKG  5.  Pending labs / tests needing follow-up:     Follow-up Appointments:  Follow-up Information     [facility] Follow up on 04/15/2022.   Specialty: Family Medicine Contact information: [address] [place] [id number] [phone]         Ala Dach, MD Follow up on 06/18/2022.   Specialty: Hematology and Oncology Contact information: [address] [place] [address] [phone]         Janina Mayo, MD. Go on 03/27/2022.   Specialty: Cardiology Contact information: [address] [place] [address] [place] by problem list: Alicia [name] is a 66 -year-old female with a past medical history of atrial fibrillation, ESRD on HD, alcoholic cirrhosis, HTN, GERD, and metastatic breast cancer in remission who presents with sudden-onset fatigue that began during outpatient HD session and admitted for management of afib w/ RVR. Afib has resolved and she is stable, negative cardiac CTA rules out ischemia.   Atrial fibrillation with rapid ventricular response Elevated troponin Patient has history of atrial fibrillation managed with metoprolol and apixaban at home. She suddenly developed fatigue during outpatient hemodialysis session on 12-9 and was found to have atrial fibrillation with RVR. Of note, she presented to the [facility] ED on 11-28 with similar symptoms. Upon arrival,  laboratory tests notable for Trop 48 and BNP 309 with chest radiograph demonstrating a prominent cardiac silhouette. Etiology likely multifactorial including recent upper respiratory infection and physiologic stress of hemodialysis. Diltiazem drip was initiated for ventricular rate control, and the patient was later transitioned to metoprolol tartrate 50 twice daily with guidance from cardiology.  Home apixaban was continued throughout this time.  On 12/10, she endorsed some chest discomfort and troponins were obtained showing elevation to 2090 and then 1873.  EKG did not show any new ischemic changes.  Cardiology evaluated her and believes this was due to demand ischemia with low concern for ACS.  Proceeded with cardiac CTA due to high risk, which was negative.  She will be discharged on metoprolol tartrate 50 twice daily and has cardiology follow-up arranged in 1 week.  On day of discharge, she remained in normal sinus rhythm with infrequent PVCs and no episodes of A-fib or RVR.  She declined any chest pain, dyspnea, palpitations, or any other cardiac symptoms.   End-stage renal disease on hemodialysis [facility] Patient has history of ESRD and receives hemodialysis per [facility] schedule. Onset of fatigue caused premature cessation of her hemodialysis  session on 12-9.  She initially had trouble tolerating dialysis during the admission but then significantly improved after paracentesis on 12/10 which removed 2 L of fluid.  She was dialyzed on 12/10 and 12/11 without issue.  She will continue her regular TTS dialysis schedule at discharge.  Pancytopenia, resolved Patient has history of thrombocytopenia dating back to at least 2022 per electronic medical record. Leukopenia was new as of 11-28. Upon arrival hemoglobin was 9.5, which is within her baseline of 9.0-12. Her thrombocytopenia is attributable to cirrhosis and anemia explained by ESRD. Leukopenia on admission possibly due to recent URI and resolved  spontaneously. Will CTM outpatient.   Cirrhosis with ascites  Patient has history of cirrhosis complicated by ascites and undergoes paracentesis once per month. She takes furosemide and spironolactone as needed for excess fluid accumulation. Calculated MELD score is 22 points, indicating 19.6% three-month mortality rate. Spironolactone and lasix were held while admitted and patient required paracentesis 12/10 to assist with HD tolerance.    Hypertension Patient has a history of hypertension managed at home with amlodipine. Blood pressure has been stable through most of hospitalization except for a slight drops during dialysis which improved after paracentesis.  She has been discharged on amlodipine 10, hydralazine 100 twice daily, metoprolol tartrate 50 twice daily.  She can hold first dose of metoprolol on dialysis days if needed.  Please readjust in the outpatient setting as needed.  Gastroesophageal reflux disease Patient has a history of GERD managed at home with calcium carbonate and omeprazole.   History of metastatic breast cancer, in remission Patient has history of metastatic breast cancer circa 1998. She currently follows with oncologist Dr Marylou Mccoy at [facility], will follow-up outpatient  Discharge Exam:   BP (!) 150/64 (BP Location: Right Arm)   Pulse 76   Temp 97.7 F (36.5 C) (Oral)   Resp 18   Wt 77.5 kg   SpO2 98%   BMI 33.36 kg/m  General: NAD CV: RRR, no MRG Pulmonary: Normal respiratory effort. Mild diffuse rhonchi and reduced breath sounds in lung bases Abdominal: Soft. Distended. No tenderness. Normal bowel sounds.   Subjective: She feels well this morning.  Spoke to her while she was eating.  She has no chest pain or dyspnea.  Informed her about normal CTA yesterday and she was relieved.  Had extensive discussion about medication changes and dialysis.  She is amenable for discharge later today.   Pertinent Labs, Studies, and Procedures:   Labs:     Latest  Ref Rng & Units 03/20/2022    7:03 AM 03/19/2022    5:20 AM 03/18/2022    2:32 AM  CBC  WBC 4.0 - 10.5 K/uL 5.2  4.3  4.9   Hemoglobin 12.0 - 15.0 g/dL 9.5  9.0  9.1   Hematocrit 36.0 - 46.0 % 29.7  28.5  29.7   Platelets 150 - 400 K/uL 93  87  93       Latest Ref Rng & Units 03/20/2022    2:18 AM 03/19/2022    5:20 AM 03/18/2022    2:32 AM  CMP  Glucose 70 - 99 mg/dL 93  85  139   BUN 8 - 23 mg/dL 19  13  33   Creatinine 0.44 - 1.00 mg/dL 3.32  2.47  5.66   Sodium 135 - 145 mmol/L 131  135  131   Potassium 3.5 - 5.1 mmol/L 4.2  3.2  4.4   Chloride 98 - 111 mmol/L 95  99  91   CO2 22 - 32 mmol/L '24  26  26   '$ Calcium 8.9 - 10.3 mg/dL 8.6  7.9  8.9   Troponins 48 (admission day 1) > 2090, 1873 (day 2-3) BNP 309 TSH WNL Technologist marrow revealing hypochromia, giant platelets   ______________________  Imaging:  DG Chest Portable 1 View Result Date: 03/16/2022 IMPRESSION: Findings suggest CHF.   ______________________  Procedures:  none  ______________________   Discharge Instructions:  Discharge Instructions     Amb referral to AFIB Clinic   Complete by: As directed    Call MD for:  difficulty breathing, headache or visual disturbances   Complete by: As directed    Call MD for:  severe uncontrolled pain   Complete by: As directed    Diet - low sodium heart healthy   Complete by: As directed    Discharge instructions   Complete by: As directed    1. If you notice any new severe chest pain, trouble breathing, dizziness, lightheadedness, or loss of consciousness, please come back to the ED to be evaluated. 2. Please do not take your lasix and spironolactone. Take your eliquis one in the morning and one at night as discussed.  We will be discharging you on metoprolol 50 mg twice a day.  You can hold your morning dose on dialysis days if your blood pressures are running low during dialysis. 3. You do not need any prescription meds for cough currently but take  any over the counter cough meds as needed and if it gets worse, message your PCP 3. Please continue your regular Tuesday, Thursday, Saturday dialysis schedule.  4. Please follow up with your primary care doctor within 1-2 weeks and keep your appt with Dr. Harl Bowie in one week.   Increase activity slowly   Complete by: As directed          Signed: Linus Galas, MD Internal Medicine PGY-1

## 2022-03-17 NOTE — Consult Note (Signed)
[facility] Renal Consultation Note    Indication for Consultation:  Management of ESRD/hemodialysis, anemia, hypertension/volume, and secondary hyperparathyroidism. PCP:  HPI: Alicia [name] is a 66 y.o. female with ESRD, HTN, alcoholic cirrhosis (gets monthly paracentesis), A-fib (on Eliquis), HTN, GERD, and Hx breast cancer who was admitted with A-fib RVR.  Presented to ED via EMS from HD - halfway through her HD, she developed dyspnea/CP/palpitations. EMS noted A-fib with RVR - started on diltiazem drip and admitted. Labs with Na 135, K 3.7, CO2 28, Mg 1.9, Ca 8.6, Trop 48, WBC 2.2, Hgb 9.5, plts 85. CXR with CHF. Cardiology consulted, plan to continue IV cardizem overnight then change to PO metoprolol. No plan for cardioversion.  Seen in ED bed - she feels much better today. No CP, dyspnea, abd pain, N/V/D. She is very concerned that this keeps happening. Was sent from HD on 12/7 as well with dyspnea - although she reported coughing fit when she got to ED - was in NSR at that time. Sent from HD as well last week 11/28 with similar presentation as today. She reports sometimes she does perfectly well on HD - other times does not tolerate, has been this way for the past year. We talked about possible reasons this is the case - wondering if correlates to when she is due for a paracentesis -> used to get q 6-8 weeks, now monthly. Certainly it is hard for the HD unit to figure out how much fluid to pull off her when her weights are rising with ascites.  Dialyzes on TTS schedule at [facility] - as above, partial HD on 12/7, partial HD on 12/9. Uses LUE AVF without recent issues.  Past Medical History:  Diagnosis Date   Anemia    Anxiety    Ascites    Breast cancer ([place])    mets   Cancer ([place])    stomach   Chronic anticoagulation    reported on for DVT prophylaxis, possibly in setting of cancer, but Eliquis d/c'd 08/2016 in setting of anemia/liver disease/acites   Cirrhosis ([place])     CKD (chronic kidney disease), stage III ([place])    Dailysis T-TH-S   Dyspnea    with fluid buildup, no current problems 01/10/21   Esophageal varices ([place]) 11/2016   Small noted on ENDO   GERD (gastroesophageal reflux disease)    Headache    otc med prn   History of blood transfusion 08/2020   Hypertension    Hypotension    Pneumonia    Portal hypertension ([place])    Past Surgical History:  Procedure Laterality Date   ABDOMINAL HYSTERECTOMY     AV FISTULA PLACEMENT Right 09/01/2020   Procedure: RIGHT ARM ARTERIOVENOUS 1st STAGE BASILIC;  Surgeon: Marty Heck, MD;  Location: [place];  Service: Vascular;  Laterality: Right;   AV FISTULA PLACEMENT Left 02/12/2021   Procedure: LEFT ARM FIRST STAGE BASILIC ARTERIOVENOUS (AV) FISTULA CREATION;  Surgeon: Marty Heck, MD;  Location: [place];  Service: Vascular;  Laterality: Left;   [place] Right 01/12/2021   Procedure: RIGHT ARM SECOND STAGE BASILIC VEIN TRANSPOSITION;  Surgeon: Marty Heck, MD;  Location: [place];  Service: Vascular;  Laterality: Right;   [place] Left 09/05/2021   Procedure: SECOND STAGE LEFT BASILIC VEIN TRANSPOSITION;  Surgeon: Marty Heck, MD;  Location: [name] OR;  Service: Vascular;  Laterality: Left;   BREAST LUMPECTOMY     BREAST SURGERY     CESAREAN  SECTION     COLONOSCOPY WITH PROPOFOL N/A 05/25/2019   Procedure: COLONOSCOPY WITH PROPOFOL;  Surgeon: Wilford Corner, MD;  Location: [facility];  Service: Endoscopy;  Laterality: N/A;   ESOPHAGOGASTRODUODENOSCOPY (EGD) WITH PROPOFOL N/A 11/21/2016   Procedure: ESOPHAGOGASTRODUODENOSCOPY;  Surgeon: Milus Banister, MD;  Location: [facility];  Service: Endoscopy;  Laterality: N/A;   ESOPHAGOGASTRODUODENOSCOPY (EGD) WITH PROPOFOL N/A 05/25/2019   Procedure: ESOPHAGOGASTRODUODENOSCOPY (EGD) WITH PROPOFOL;  Surgeon: Wilford Corner, MD;  Location: [facility];  Service: Endoscopy;  Laterality: N/A;    INSERTION OF DIALYSIS CATHETER Left 09/01/2020   Procedure: INSERTION OF A LEFT INTERNAL JUGULAR TUNNELLED DIALYSIS CATHETER. ULTRASOUND GUIDED;  Surgeon: Marty Heck, MD;  Location: [place];  Service: Vascular;  Laterality: Left;   IR PARACENTESIS  09/04/2016   IR PARACENTESIS  09/20/2016   IR PARACENTESIS  10/22/2016   IR PARACENTESIS  01/27/2017   IR PARACENTESIS  04/11/2017   IR PARACENTESIS  06/05/2017   IR PARACENTESIS  06/05/2017   IR PARACENTESIS  06/19/2017   IR PARACENTESIS  07/03/2017   IR PARACENTESIS  07/17/2017   IR PARACENTESIS  08/01/2017   IR PARACENTESIS  08/14/2017   IR PARACENTESIS  08/28/2017   IR PARACENTESIS  09/11/2017   IR PARACENTESIS  09/25/2017   IR PARACENTESIS  10/08/2017   IR PARACENTESIS  10/23/2017   IR PARACENTESIS  11/06/2017   IR PARACENTESIS  11/20/2017   IR PARACENTESIS  12/04/2017   IR PARACENTESIS  12/18/2017   IR PARACENTESIS  01/19/2018   IR PARACENTESIS  02/09/2018   IR PARACENTESIS  02/24/2018   IR PARACENTESIS  03/16/2018   IR PARACENTESIS  04/10/2018   IR PARACENTESIS  05/08/2018   IR PARACENTESIS  06/11/2018   IR PARACENTESIS  07/07/2018   IR PARACENTESIS  09/04/2018   IR PARACENTESIS  10/22/2018   IR PARACENTESIS  11/30/2018   IR PARACENTESIS  01/14/2019   IR PARACENTESIS  04/30/2019   IR PARACENTESIS  07/19/2019   IR PARACENTESIS  09/05/2020   IR PARACENTESIS  01/16/2022   IR PARACENTESIS  02/18/2022   IR RADIOLOGIST EVAL & MGMT  12/23/2017   LYMPH NODE DISSECTION     ULTRASOUND GUIDANCE FOR VASCULAR ACCESS Left 09/01/2020   Procedure: ULTRASOUND GUIDANCE FOR VASCULAR ACCESS;  Surgeon: Marty Heck, MD;  Location: [facility] OR;  Service: Vascular;  Laterality: Left;   Family History  Problem Relation Age of Onset   Diabetes Mother    Diabetes Father    Diabetes Brother    Colon cancer Neg Hx    Stomach cancer Neg Hx    Esophageal cancer Neg Hx    Social History:  reports that she has quit smoking. Her  smoking use included cigars. She has never used smokeless tobacco. She reports that she does not currently use alcohol. She reports that she does not use drugs.  ROS: As per HPI otherwise negative.  Physical Exam: Vitals:   03/17/22 0630 03/17/22 0700 03/17/22 0750 03/17/22 1030  BP: (!) 150/71 (!) 155/84 (!) 142/105 (!) 184/87  Pulse: 75 81 71 78  Resp: 18 (!) 23 (!) 27 19  Temp:   99.1 F (37.3 C)   TempSrc:      SpO2: 92% 99% 93% 92%     General: Well developed, well nourished, in no acute distress. Nasal O2 in place. Head: Normocephalic, atraumatic, sclera non-icteric, mucus membranes are moist. Neck: Supple without lymphadenopathy/masses. Lungs: Clear bilaterally to auscultation without wheezes, rales, or rhonchi. Breathing is unlabored.  Heart: RRR with normal S1, S2. No murmurs, rubs, or gallops appreciated. Abdomen: Distended, somewhat tense Musculoskeletal:  Strength and tone appear normal for age. Lower extremities: No edema or ischemic changes, no open wounds. Neuro: Alert and oriented X 3. Moves all extremities spontaneously. Psych:  Responds to questions appropriately with a normal affect. Dialysis Access: LUE AVF + bruit  Allergies  Allergen Reactions   Penicillins Other (See Comments)    Childhood reaction    Antivert [Meclizine] Anxiety   Compazine [Prochlorperazine Edisylate] Anxiety   Flexeril [Cyclobenzaprine] Anxiety   Prior to Admission medications   Medication Sig Start Date End Date Taking? Authorizing Provider  acetaminophen (TYLENOL) 500 MG tablet Take 1,000 mg by mouth every 6 (six) hours as needed for moderate pain or headache.   Yes [provider]  amLODipine (NORVASC) 10 MG tablet Take 10 mg by mouth every evening. 05/14/20  Yes [provider]  apixaban (ELIQUIS) 2.5 MG TABS tablet Take 1 tablet (2.5 mg total) by mouth 2 (two) times daily. 12/14/21  Yes Janina Mayo, MD  aspirin-acetaminophen-caffeine (EXCEDRIN MIGRAINE)  [phone] MG tablet Take 2 tablets by mouth every 6 (six) hours as needed for headache.   Yes [provider]  Azelastine HCl 137 MCG/SPRAY SOLN Place into both nostrils. 03/13/22  Yes [provider]  benzonatate (TESSALON) 100 MG capsule Take 1 capsule (100 mg total) by mouth every 8 (eight) hours for 4 days. 03/15/22 03/19/22 Yes Marcello Fennel, PA-C  calcitRIOL (ROCALTROL) 0.5 MCG capsule Take 0.5 mcg by mouth Every Tuesday,Thursday,and Saturday with dialysis.   Yes [provider]  Calcium Acetate 667 MG TABS Take 1,334 mg by mouth 3 (three) times daily with meals.   Yes [provider]  calcium carbonate (TUMS - DOSED IN MG ELEMENTAL CALCIUM) 500 MG chewable tablet Chew 2 tablets by mouth 3 (three) times daily as needed for indigestion or heartburn.   Yes [provider]  furosemide (LASIX) 40 MG tablet Take 40 mg by mouth as needed for fluid or edema.   Yes [provider]  gabapentin (NEURONTIN) 100 MG capsule Take 200 mg by mouth See admin instructions. Take 200 mg by mouth before dialysis on Tues/Thurs/Sat 05/23/21  Yes [provider]  guaiFENesin (MUCINEX) 600 MG 12 hr tablet Take 600 mg by mouth 2 (two) times daily as needed for to loosen phlegm. 03/13/22 03/13/23 Yes [provider]  hydrALAZINE (APRESOLINE) 100 MG tablet Take 100 mg by mouth 2 (two) times daily. 05/16/20  Yes [provider]  lidocaine-prilocaine (EMLA) cream Apply 1 Application topically as needed (dialysis).   Yes [provider]  loperamide (IMODIUM) 2 MG capsule Take 4 mg by mouth daily as needed for diarrhea or loose stools.   Yes [provider]  metoprolol succinate (TOPROL-XL) 25 MG 24 hr tablet Take 1 tablet (25 mg total) by mouth daily. Take with or immediately following a meal. Patient taking differently: Take 25 mg by mouth at bedtime. Take with or immediately following a meal. 07/25/21  Yes Branch, Royetta Crochet, MD   Phenylephrine-APAP-guaiFENesin (SINUS CONGESTION/PAIN DAYTIME) 5-325-200 MG TABS Take 1 tablet by mouth daily as needed (sinus issues.).   Yes [provider]  PRILOSEC OTC 20 MG tablet Take 20 mg by mouth daily as needed (for heartburn or reflux).   Yes [provider]  sodium chloride (OCEAN) 0.65 % SOLN nasal spray Place 1 spray into both nostrils as needed for congestion.   Yes [provider]  spironolactone (ALDACTONE) 25 MG tablet Take 25 mg by mouth as needed (fluid or edema). 08/29/20  Yes [provider]  oxyCODONE (ROXICODONE) 5 MG immediate release tablet Take 1 tablet (5 mg total) by mouth every 6 (six) hours as needed for up to 10 doses for breakthrough pain. Patient not taking: Reported on 03/05/2022 10/13/21   Lennice Sites, DO   Current Facility-Administered Medications  Medication Dose Route Frequency Provider Last Rate Last Admin   apixaban (ELIQUIS) tablet 2.5 mg  2.5 mg Oral BID Jeralene Huff, MD   2.5 mg at 03/17/22 4081   benzonatate (TESSALON) capsule 100 mg  100 mg Oral TID PRN Lucious Groves, DO       [START ON 03/19/2022] calcitRIOL (ROCALTROL) capsule 0.5 mcg  0.5 mcg Oral Q T,Th,Sa-HD Virl Axe, MD       calcium acetate (PHOSLO) capsule 1,334 mg  1,334 mg Oral TID WC Virl Axe, MD   1,334 mg at 03/17/22 0814   [START ON 03/19/2022] gabapentin (NEURONTIN) capsule 200 mg  200 mg Oral Q T,Th,Sat-1800 Virl Axe, MD       metoprolol tartrate (LOPRESSOR) tablet 50 mg  50 mg Oral BID Freada Bergeron, MD       pantoprazole (PROTONIX) EC tablet 40 mg  40 mg Oral Daily Virl Axe, MD   40 mg at 03/17/22 4481   polyethylene glycol (MIRALAX / GLYCOLAX) packet 17 g  17 g Oral Daily PRN Virl Axe, MD       Current Outpatient Medications  Medication Sig Dispense Refill   acetaminophen (TYLENOL) 500 MG tablet Take 1,000 mg by mouth every 6 (six) hours as needed for moderate pain or headache.     amLODipine  (NORVASC) 10 MG tablet Take 10 mg by mouth every evening.     apixaban (ELIQUIS) 2.5 MG TABS tablet Take 1 tablet (2.5 mg total) by mouth 2 (two) times daily. 180 tablet 1   aspirin-acetaminophen-caffeine (EXCEDRIN MIGRAINE) 250-250-65 MG tablet Take 2 tablets by mouth every 6 (six) hours as needed for headache.     Azelastine HCl 137 MCG/SPRAY SOLN Place into both nostrils.     benzonatate (TESSALON) 100 MG capsule Take 1 capsule (100 mg total) by mouth every 8 (eight) hours for 4 days. 12 capsule 0   calcitRIOL (ROCALTROL) 0.5 MCG capsule Take 0.5 mcg by mouth Every Tuesday,Thursday,and Saturday with dialysis.     Calcium Acetate 667 MG TABS Take 1,334 mg by mouth 3 (three) times daily with meals.     calcium carbonate (TUMS - DOSED IN MG ELEMENTAL CALCIUM) 500 MG chewable tablet Chew 2 tablets by mouth 3 (three) times daily as needed for indigestion or heartburn.     furosemide (LASIX) 40 MG tablet Take 40 mg by mouth as needed for fluid or edema.     gabapentin (NEURONTIN) 100 MG capsule Take 200 mg by mouth See admin instructions. Take 200 mg by mouth before dialysis on Tues/Thurs/Sat     guaiFENesin (MUCINEX) 600 MG 12 hr tablet Take 600 mg by mouth 2 (two) times daily as needed for to loosen phlegm.     hydrALAZINE (APRESOLINE) 100 MG tablet Take 100 mg by mouth 2 (two) times daily.     lidocaine-prilocaine (EMLA) cream Apply 1 Application topically as needed (dialysis).     loperamide (IMODIUM) 2 MG capsule Take 4 mg by mouth daily as needed for diarrhea or loose stools.     metoprolol succinate (TOPROL-XL)  25 MG 24 hr tablet Take 1 tablet (25 mg total) by mouth daily. Take with or immediately following a meal. (Patient taking differently: Take 25 mg by mouth at bedtime. Take with or immediately following a meal.) 90 tablet 3   Phenylephrine-APAP-guaiFENesin (SINUS CONGESTION/PAIN DAYTIME) 5-325-200 MG TABS Take 1 tablet by mouth daily as needed (sinus issues.).     PRILOSEC OTC 20 MG tablet  Take 20 mg by mouth daily as needed (for heartburn or reflux).     sodium chloride (OCEAN) 0.65 % SOLN nasal spray Place 1 spray into both nostrils as needed for congestion.     spironolactone (ALDACTONE) 25 MG tablet Take 25 mg by mouth as needed (fluid or edema).     oxyCODONE (ROXICODONE) 5 MG immediate release tablet Take 1 tablet (5 mg total) by mouth every 6 (six) hours as needed for up to 10 doses for breakthrough pain. (Patient not taking: Reported on 03/05/2022) 10 tablet 0   Labs: Basic Metabolic Panel: Recent Labs  Lab 03/14/22 1507 03/16/22 1519 03/17/22 0408  NA 135 135 135  K 3.4* 3.7 4.8  CL 92* 93* 91*  CO2 '30 28 31  '$ GLUCOSE 88 92 114*  BUN '9 11 21  '$ CREATININE 3.34* 3.71* 4.71*  CALCIUM 7.9* 8.6* 9.0  PHOS  --   --  4.5   Liver Function Tests: Recent Labs  Lab 03/16/22 1519 03/17/22 0408  AST 30  --   ALT 15  --   ALKPHOS 84  --   BILITOT 0.6  --   PROT 7.2  --   ALBUMIN 3.2* 3.1*   CBC: Recent Labs  Lab 03/14/22 1507 03/16/22 1519 03/17/22 0408  WBC 1.8* 2.2* 3.5*  NEUTROABS 0.8* 1.5* 1.9  HGB 9.6* 9.5* 10.1*  HCT 31.1* 30.7* 32.6*  MCV 90.7 91.6 91.6  PLT 87* 85* 101*   Studies/Results: DG Chest Portable 1 View  Result Date: 03/16/2022 CLINICAL DATA:  chest pain, dialysis EXAM: PORTABLE CHEST 1 VIEW COMPARISON:  03/14/2022 FINDINGS: Cardiac silhouette is prominent. There is pulmonary interstitial prominence with vascular congestion. No focal consolidation. No pneumothorax or pleural effusion identified. Aorta is calcified. Right-sided Port-A-Cath tip overlies distal SVC. IMPRESSION: Findings suggest CHF. Electronically Signed   By: Sammie Bench M.D.   On: 03/16/2022 15:06    Dialysis Orders: TTS at [facility] 3:30hr, 400/A1.5, EDW 80.5kg, 2K/2Ca, AVF, heparin 2000 bolus - no ESA - venofer '50mg'$  IV weekly - Calcitriol 1.6mg PO q HD - [place] q HD for itching  Assessment/Plan:  A-fib RVR: Triggered by dialysis 12/9 and many other times she  "does not tolerate dialysis" with similar symptoms - wonder if having transient RVR? Electrolytes ok today. She is volume up in general, due for paracentesis. Cardiology consulted, adjusting medications. On Eliquis.  ESRD:  Usual TTS schedule - had to end the past 2 HD with dyspnea/CP. Wonder if being up on fluid and due for paracentesis is triggering this. I would favor going ahead with paracentesis today or tomorrow as opposed to waiting until Wed and we can dialyze her tomorrow and see if she tolerates better. Certainly agree with better rate/rhythm control adjustments as well, as above.   Hypertension/volume: BP high - CXR with fluid as well as significant ascites on exam.  Anemia: Hgb 10.1 today. Truly pancytopenic, all improving - unclear cause.  Metabolic bone disease: Ca/Phos ok.    KVeneta Penton PA-C 03/17/2022, 11:25 AM  [employer]

## 2022-03-18 ENCOUNTER — Inpatient Hospital Stay (HOSPITAL_COMMUNITY): Payer: Medicare (Managed Care)

## 2022-03-18 DIAGNOSIS — I4891 Unspecified atrial fibrillation: Secondary | ICD-10-CM

## 2022-03-18 DIAGNOSIS — R7989 Other specified abnormal findings of blood chemistry: Secondary | ICD-10-CM | POA: Diagnosis not present

## 2022-03-18 HISTORY — PX: IR PARACENTESIS: IMG2679

## 2022-03-18 LAB — CBC WITH DIFFERENTIAL/PLATELET
Abs Immature Granulocytes: 0.01 10*3/uL (ref 0.00–0.07)
Basophils Absolute: 0 10*3/uL (ref 0.0–0.1)
Basophils Relative: 0 %
Eosinophils Absolute: 0.1 10*3/uL (ref 0.0–0.5)
Eosinophils Relative: 2 %
HCT: 29.7 % — ABNORMAL LOW (ref 36.0–46.0)
Hemoglobin: 9.1 g/dL — ABNORMAL LOW (ref 12.0–15.0)
Immature Granulocytes: 0 %
Lymphocytes Relative: 18 %
Lymphs Abs: 0.9 10*3/uL (ref 0.7–4.0)
MCH: 28.3 pg (ref 26.0–34.0)
MCHC: 30.6 g/dL (ref 30.0–36.0)
MCV: 92.2 fL (ref 80.0–100.0)
Monocytes Absolute: 0.3 10*3/uL (ref 0.1–1.0)
Monocytes Relative: 7 %
Neutro Abs: 3.6 10*3/uL (ref 1.7–7.7)
Neutrophils Relative %: 73 %
Platelets: 93 10*3/uL — ABNORMAL LOW (ref 150–400)
RBC: 3.22 MIL/uL — ABNORMAL LOW (ref 3.87–5.11)
RDW: 14.6 % (ref 11.5–15.5)
WBC: 4.9 10*3/uL (ref 4.0–10.5)
nRBC: 0 % (ref 0.0–0.2)

## 2022-03-18 LAB — TROPONIN I (HIGH SENSITIVITY): Troponin I (High Sensitivity): 1873 ng/L (ref <18)

## 2022-03-18 LAB — HEPATITIS B SURFACE ANTIBODY, QUANTITATIVE: Hep B S AB Quant (Post): >1000 m[IU]/mL (ref >9.9)

## 2022-03-18 LAB — RENAL FUNCTION PANEL
Albumin: 3 g/dL — ABNORMAL LOW (ref 3.5–5.0)
Anion gap: 14 (ref 5–15)
BUN: 33 mg/dL — ABNORMAL HIGH (ref 8–23)
CO2: 26 mmol/L (ref 22–32)
Calcium: 8.9 mg/dL (ref 8.9–10.3)
Chloride: 91 mmol/L — ABNORMAL LOW (ref 98–111)
Creatinine, Ser: 5.66 mg/dL — ABNORMAL HIGH (ref 0.44–1.00)
GFR, Estimated: 8 mL/min — ABNORMAL LOW (ref >60)
Glucose, Bld: 139 mg/dL — ABNORMAL HIGH (ref 70–99)
Phosphorus: 4.6 mg/dL (ref 2.5–4.6)
Potassium: 4.4 mmol/L (ref 3.5–5.1)
Sodium: 131 mmol/L — ABNORMAL LOW (ref 135–145)

## 2022-03-18 MED ORDER — ALBUTEROL SULFATE (2.5 MG/3ML) 0.083% IN NEBU
3.0000 mL | INHALATION_SOLUTION | Freq: Four times a day (QID) | RESPIRATORY_TRACT | Status: DC | PRN
  Administered 2022-03-18: 3 mL via RESPIRATORY_TRACT
  Filled 2022-03-18: qty 3

## 2022-03-18 MED ORDER — CHLORHEXIDINE GLUCONATE CLOTH 2 % EX PADS
6.0000 | MEDICATED_PAD | Freq: Every day | CUTANEOUS | Status: DC
  Administered 2022-03-18: 6 via TOPICAL

## 2022-03-18 MED ORDER — LIDOCAINE HCL 1 % IJ SOLN
INTRAMUSCULAR | Status: AC
  Administered 2022-03-18: 10 mL
  Filled 2022-03-18: qty 20

## 2022-03-18 NOTE — Progress Notes (Signed)
  [facility] Progress Note   Subjective:   Seen in room. Feeling much better. Had HD and paracentesis yesterday. 2L yield w/ the paracentesis.   Objective Vitals:   03/17/22 1932 03/17/22 2113 03/17/22 2318 03/18/22 0516  BP: (!) 198/93 (!) 183/89 (!) 167/97 (!) 173/87  Pulse: 88 90 81 85  Resp:  '18 17 16  '$ Temp:  98.4 F (36.9 C) 98.5 F (36.9 C) 98.2 F (36.8 C)  TempSrc:  Oral Oral Oral  SpO2: 96% 96% 93% 92%  Weight:    81.5 kg   Physical Exam General: Alert, pleasant female in NAD Heart: RRR, no murmurs, rubs or gallops Lungs: CTA bilaterally without wheezing, rhonchi or rales Abdomen: Soft, moderately distended, +BS, mild ascites Extremities: No edema b/l lower extremities Dialysis Access: LUE AVF + bruit  Dialysis Orders: TTS at [facility] 3.5h   400/A1.5  80.5kg   2K/2Ca  AVF   Heparin 2000 bolus - no ESA - venofer '50mg'$  IV weekly - Calcitriol 1.30mg PO q HD - [place] q HD for itching  Assessment/Plan:  A-fib RVR: Triggered by dialysis 12/9 and many other times she "does not tolerate dialysis" with similar symptoms. She was volume up in general. Cardiology consulted, adjusting medications. On Eliquis. Had HD and paracentesis yesterday, now she is back in NSR today.   ESRD - on HD TTS. Had to end the past 2 outpt HD sessions due to dyspnea/CP. Had extra HD yest, back on sched for regular HD today/ tonight.   BP/volume: BP's normal to high now, better. Initial CXR w/ fluid. Had HD here yest w/ 2 L UF, probably could have taken off more. Is due for regular HD today/ tonight. Plan max UF as tolerated. Need standing wt as well. On room air and min edema by exam today.   Anemia: Hgb 9.1 today. Hx breast cancer, unclear if this is why she is not ESA  Metabolic bone disease: Ca/Phos ok.   RKelly Splinter MD 03/19/2022, 12:37 PM  Recent Labs  Lab 03/17/22 0408 03/18/22 0232 03/19/22 0520  HGB 10.1* 9.1* 9.0*  ALBUMIN 3.1* 3.0*  --   CALCIUM 9.0 8.9 7.9*  PHOS  4.5 4.6  --   CREATININE 4.71* 5.66* 2.47*  K 4.8 4.4 3.2*    Inpatient medications:  amLODipine  10 mg Oral QPM   calcitRIOL  0.5 mcg Oral Q T,Th,Sa-HD   calcium acetate  1,334 mg Oral TID WC   gabapentin  200 mg Oral Q T,Th,Sat-1800   hydrALAZINE  100 mg Oral BID   metoprolol tartrate  50 mg Oral BID   pantoprazole  40 mg Oral Daily    potassium chloride     benzonatate, guaiFENesin, polyethylene glycol

## 2022-03-18 NOTE — Progress Notes (Signed)
Rounding Note    Patient Name: Alicia [name] Date of Encounter: 03/18/2022  [place] Cardiologist: Janina Mayo, MD   Subjective   No chest pain since admit no SOB   Inpatient Medications    Scheduled Meds:  amLODipine  10 mg Oral QPM   [START ON 03/19/2022] calcitRIOL  0.5 mcg Oral Q T,Th,Sa-HD   calcium acetate  1,334 mg Oral TID WC   [START ON 03/19/2022] gabapentin  200 mg Oral Q T,Th,Sat-1800   hydrALAZINE  100 mg Oral BID   metoprolol tartrate  50 mg Oral BID   pantoprazole  40 mg Oral Daily   Continuous Infusions:  PRN Meds: benzonatate, guaiFENesin, polyethylene glycol   Vital Signs    Vitals:   03/17/22 1932 03/17/22 2113 03/17/22 2318 03/18/22 0516  BP: (!) 198/93 (!) 183/89 (!) 167/97 (!) 173/87  Pulse: 88 90 81 85  Resp:  '18 17 16  '$ Temp:  98.4 F (36.9 C) 98.5 F (36.9 C) 98.2 F (36.8 C)  TempSrc:  Oral Oral Oral  SpO2: 96% 96% 93% 92%  Weight:    81.5 kg    Intake/Output Summary (Last 24 hours) at 03/18/2022 0703 Last data filed at 03/17/2022 2010 Gross per 24 hour  Intake 240 ml  Output --  Net 240 ml      03/18/2022    5:16 AM 03/05/2022    4:38 PM 10/15/2021    9:40 AM  Last 3 Weights  Weight (lbs) 179 lb 9.6 oz 154 lb 165 lb  Weight (kg) 81.466 kg 69.854 kg 74.844 kg      Telemetry    SR - Personally Reviewed  ECG    Yesterday SR and no acute ST changes - Personally Reviewed  Physical Exam   GEN: No acute distress.   Neck: No JVD Cardiac: RRR, no murmurs, rubs, or gallops.  Respiratory: Clear to auscultation bilaterally. GI: Soft, nontender, +distended  for paracentesis today MS: No edema; No deformity. Neuro:  Nonfocal  Psych: Normal affect   Labs    High Sensitivity Troponin:   Recent Labs  Lab 03/05/22 1708 03/05/22 1820 03/16/22 1519 03/17/22 2018 03/18/22 0232  TROPONINIHS 24* 42* 48* 2,090* 1,873*     Chemistry Recent Labs  Lab 03/16/22 1519 03/17/22 0408 03/18/22 0232  NA  135 135 131*  K 3.7 4.8 4.4  CL 93* 91* 91*  CO2 '28 31 26  '$ GLUCOSE 92 114* 139*  BUN 11 21 33*  CREATININE 3.71* 4.71* 5.66*  CALCIUM 8.6* 9.0 8.9  MG 1.9  --   --   PROT 7.2  --   --   ALBUMIN 3.2* 3.1* 3.0*  AST 30  --   --   ALT 15  --   --   ALKPHOS 84  --   --   BILITOT 0.6  --   --   GFRNONAA 13* 10* 8*  ANIONGAP '14 13 14    '$ Lipids No results for input(s): "CHOL", "TRIG", "HDL", "LABVLDL", "LDLCALC", "CHOLHDL" in the last 168 hours.  Hematology Recent Labs  Lab 03/16/22 1519 03/16/22 2038 03/17/22 0408 03/18/22 0232  WBC 2.2*  --  3.5* 4.9  RBC 3.35* 3.59* 3.56* 3.22*  HGB 9.5*  --  10.1* 9.1*  HCT 30.7*  --  32.6* 29.7*  MCV 91.6  --  91.6 92.2  MCH 28.4  --  28.4 28.3  MCHC 30.9  --  31.0 30.6  RDW 14.7  --  14.6  14.6  PLT 85*  --  101* 93*   Thyroid  Recent Labs  Lab 03/16/22 2038  TSH 1.215    BNP Recent Labs  Lab 03/14/22 1508  BNP 309.1*    DDimer No results for input(s): "DDIMER" in the last 168 hours.   Radiology    DG Chest Portable 1 View  Result Date: 03/16/2022 CLINICAL DATA:  chest pain, dialysis EXAM: PORTABLE CHEST 1 VIEW COMPARISON:  03/14/2022 FINDINGS: Cardiac silhouette is prominent. There is pulmonary interstitial prominence with vascular congestion. No focal consolidation. No pneumothorax or pleural effusion identified. Aorta is calcified. Right-sided Port-A-Cath tip overlies distal SVC. IMPRESSION: Findings suggest CHF. Electronically Signed   By: Sammie Bench M.D.   On: 03/16/2022 15:06    Cardiac Studies   No new tests ordered   ECHO: 05/28/2021  IMPRESSIONS   1. Left ventricular ejection fraction, by estimation, is 60 to 65%. Left  ventricular ejection fraction by 3D volume is 60 %. The left ventricle has  normal function. The left ventricle has no regional wall motion  abnormalities. There is moderate left  ventricular hypertrophy. Left ventricular diastolic parameters are  consistent with Grade II diastolic  dysfunction (pseudonormalization).  Elevated left atrial pressure.   2. Right ventricular systolic function is normal. The right ventricular  size is normal. Tricuspid regurgitation signal is inadequate for assessing  PA pressure.   3. Left atrial size was moderately dilated.   4. The mitral valve is degenerative. Trivial mitral valve regurgitation.   5. The aortic valve is tricuspid. Aortic valve regurgitation is not  visualized. Aortic valve sclerosis is present, with no evidence of aortic  valve stenosis.   6. The inferior vena cava is normal in size with greater than 50%  respiratory variability, suggesting right atrial pressure of 3 mmHg.   Patient Profile     66 y.o. female  hx of paroxysmal atrial fibrillation, breast cancer (stage III R breast cancer '92 s/p surgical resection and adjuvant chemo/XRT, recurrence with metastatic triple negative BC 6/11/2021on Taxol which was held 2/2 CKD 10/15/2019-followed at [facility]), hx of DVT however eliquis stopped 2/2 anemia and cirrhosis esophageal varices and portal hypertension with recurrent paracentesis, ESRD on IHD Tue Thurs Saturday (right basilic vein fistula has potentially thrombosed, s/p L A-V fistula 40/9811), alcoholic cirrhosis, was admitted 12/09 with atrial fib, RVR.    Assessment & Plan    Atrial fib, RVR - Incorrectly taking apixaban, taking 2.5 x 2 in the morning. - however, takes hydralazine 100 mg bid and is compliant w/ that - says she can take the Eliquis BID and be consistent - is scared the Afib will happen again  - per Dr Harl Bowie note 07/25/2021: Will cont eliquis and BB (Toprol XL 25 mg). In terms of AAD, amiodarone is not an ideal option with cirrhosis. Dofetilide/sotalol may be options.  - Toprol XL 25 mg changed to lopressor 50 BID   -maintaining SR   2.  Elevated troponin 48>>2,090>>1,873   possible demand ischemia in HD pt with a fib with RVR  evaluate with cardiac CTA vs stress test. Defer to Dr. Debara Pickett   3. ESRD  on HD - incomplete HD 12/09, mgt per IM, +/- Nephrology  4.  Cirrhosis with paracentesis freq  through [facility]     For questions or updates, please contact [place] Please consult [email or web address] for contact info under        Signed, Cecilie Kicks, NP  03/18/2022, 7:03 AM

## 2022-03-18 NOTE — Plan of Care (Signed)

## 2022-03-18 NOTE — Progress Notes (Signed)
Heart Failure Navigator Progress Note  Assessed for Heart & Vascular TOC clinic readiness.  Patient does not meet criteria due to ESRD on hemodialysis.   Navigator will sign off at this time.    Rafia Shedden, BSN, RN Heart Failure Nurse Navigator Secure Chat Only   

## 2022-03-18 NOTE — Progress Notes (Signed)
Notified by lab of critical troponin of 2090.  Dr. Stann Mainland notified and orders received.  Pt denies c/o CP or SOB.  VSS.  Will continue to monitor.  Jodell Cipro

## 2022-03-18 NOTE — Procedures (Signed)
Ultrasound-guided therapeutic paracentesis performed yielding 2 liters of straw colored fluid.No immediate complications. EBL is none.

## 2022-03-18 NOTE — Progress Notes (Addendum)
Subjective:   Patient is feeling alright today. Updated patient on elevated troponin. Patient continues to deny chest pain, shortness of breath, nausea, or vomiting.  Patient has no complaints at this time. Discussed plan to follow-up on cardiology recommendations regarding elevated troponin. Discussed plan for paracentesis prior to HD session today.  Objective:  Vital signs in last 24 hours: Vitals:   03/17/22 1932 03/17/22 2113 03/17/22 2318 03/18/22 0516  BP: (!) 198/93 (!) 183/89 (!) 167/97 (!) 173/87  Pulse: 88 90 81 85  Resp:  '18 17 16  '$ Temp:  98.4 F (36.9 C) 98.5 F (36.9 C) 98.2 F (36.8 C)  TempSrc:  Oral Oral Oral  SpO2: 96% 96% 93% 92%  Weight:    81.5 kg   Physical Exam: Constitutional: awake and alert, sitting comfortably on bed, cooperative, not in acute distress  Cardiovascular: Regular rate, regular rhythm. No murmurs, rubs, or gallops. Normal radial and PT pulses bilaterally. No LE edema.  Pulmonary: Normal respiratory effort. No rales or rhonchi. Mild wheezing in all lung fields.  Abdominal: Soft. Distended. No tenderness. Normal bowel sounds.  Musculoskeletal: Normal range of motion.     Neurological: Alert and oriented to person, place, and time. Non-focal. Skin: warm and dry.    Assessment/Plan:  Principal Problem:   Atrial fibrillation with RVR (HCC) Active Problems:   Alcoholic cirrhosis of liver with ascites (HCC)   HTN (hypertension)   History of breast cancer   ESRD on dialysis (HCC)  Alicia [name] is a 66 -year-old female with a past medical history of atrial fibrillation, ESRD on HD, alcoholic cirrhosis, HTN, GERD, and metastatic breast cancer in remission who presents with sudden-onset fatigue that began during outpatient HD session and admitted for management of afib w/ RVR.      #Atrial fibrillation with rapid ventricular response #Elevated troponin On metoprolol and apixaban at home. Presented w/ acute-onset fatigue during HD session  on 12/9. Found to have atrial fibrillation with RVR on admission. Trop 48 and BNP 309 with chest radiograph demonstrating a prominent cardiac silhouette on admission. Suspect multifactorial etiology given recent URI and physiologic stress of HD. Patient has been transitioned from diltiazem drip to metoprolol following resolution of tachycardia.  Patient had elevated troponin of 2090 overnight, asymptomatic at that time.  Troponin improved to 1873 this morning.  Suspect demand ischemia.  Cardiology will evaluate further with cardiac CTA vs stress test. - Metoprolol '50mg'$  BID - Apixaban 2.'5mg'$  BID - Cardiac telemetry     #ESRD on HD (TTS) Patient has poor fluid management complicated by cirrhotic ascites. Suspect this is contributing to her decreased tolerance of HD. Plan for HD today following paracentesis which may improve HD tolerance.  - Gabapentin '200mg'$  on TTS - Calcitriol 0.5ug on TTS - Calcium acetate '1334mg'$  q8 - Trend BMP      #Pancytopenia Thrombocytopenia dates back to 2022. Suspect secondary to cirrhosis. Leukopenia new as of 11/28. Hgb stable at 9.1 today. Anemia likely secondary to ESRD. Suspect leukopenia secondary to recent viral infection vs malnutrition, now resolved. - Trend CBC      #Cirrhosis with ascites  Receives paracentesis once per month. Next session scheduled for 12/13. On furosemide and spironolactone PRN for fluid accumulation. MELD score is 22 (19.6% three-month mortality rate). Suspect fluid accumulation likely contributing to decreased HD tolerance. Plan for paracentesis prior to next HD session today.  - Hold furosemide '40mg'$  PRN and spironolactone '25mg'$  PRN     #HTN On amlodipine, hydralazine, and metoprolol at  home. Patient remains hypertensive today. Will reassess BP following HD today. May need to start lasix to help with hypertension.   - Amlodipine '10mg'$   - Hydralazine 100 mg BID - Metoprolol '50mg'$  BID  #Wheezing Diffuse wheezing noted on exam today.   Patient denies history of asthma or COPD and no record of these conditions in her chart.  Will trial albuterol inhaler to help with symptoms. - Albuterol inhaler (2 puffs q6 PRN)   #GERD On calcium carbonate and omeprazole at home.  - Pantoprazole '40mg'$      #History of metastatic breast cancer, in remission Patient follows with oncologist Dr Marylou Mccoy at [facility]. Has outpatient visit scheduled for 04/24/2022.  Prior to Admission Living Arrangement: home with family  Anticipated Discharge Location: home with family  Barriers to Discharge: continued management Dispo: Anticipated discharge in approximately more than 2 day(s).   Starlyn Skeans, MD 03/18/2022, 6:45 AM Pager: [phone] After 5pm on weekdays and 1pm on weekends: On Call pager [phone]

## 2022-03-19 ENCOUNTER — Inpatient Hospital Stay (HOSPITAL_COMMUNITY): Payer: Medicare (Managed Care)

## 2022-03-19 DIAGNOSIS — R079 Chest pain, unspecified: Secondary | ICD-10-CM

## 2022-03-19 DIAGNOSIS — N186 End stage renal disease: Secondary | ICD-10-CM | POA: Diagnosis not present

## 2022-03-19 DIAGNOSIS — R7989 Other specified abnormal findings of blood chemistry: Secondary | ICD-10-CM

## 2022-03-19 DIAGNOSIS — K7460 Unspecified cirrhosis of liver: Secondary | ICD-10-CM

## 2022-03-19 DIAGNOSIS — Z87891 Personal history of nicotine dependence: Secondary | ICD-10-CM

## 2022-03-19 DIAGNOSIS — I120 Hypertensive chronic kidney disease with stage 5 chronic kidney disease or end stage renal disease: Secondary | ICD-10-CM

## 2022-03-19 DIAGNOSIS — R188 Other ascites: Secondary | ICD-10-CM

## 2022-03-19 DIAGNOSIS — I4891 Unspecified atrial fibrillation: Secondary | ICD-10-CM | POA: Diagnosis not present

## 2022-03-19 LAB — CBC
HCT: 28.5 % — ABNORMAL LOW (ref 36.0–46.0)
Hemoglobin: 9 g/dL — ABNORMAL LOW (ref 12.0–15.0)
MCH: 28.3 pg (ref 26.0–34.0)
MCHC: 31.6 g/dL (ref 30.0–36.0)
MCV: 89.6 fL (ref 80.0–100.0)
Platelets: 87 10*3/uL — ABNORMAL LOW (ref 150–400)
RBC: 3.18 MIL/uL — ABNORMAL LOW (ref 3.87–5.11)
RDW: 14.5 % (ref 11.5–15.5)
WBC: 4.3 10*3/uL (ref 4.0–10.5)
nRBC: 0 % (ref 0.0–0.2)

## 2022-03-19 LAB — BASIC METABOLIC PANEL
Anion gap: 10 (ref 5–15)
BUN: 13 mg/dL (ref 8–23)
CO2: 26 mmol/L (ref 22–32)
Calcium: 7.9 mg/dL — ABNORMAL LOW (ref 8.9–10.3)
Chloride: 99 mmol/L (ref 98–111)
Creatinine, Ser: 2.47 mg/dL — ABNORMAL HIGH (ref 0.44–1.00)
GFR, Estimated: 21 mL/min — ABNORMAL LOW (ref >60)
Glucose, Bld: 85 mg/dL (ref 70–99)
Potassium: 3.2 mmol/L — ABNORMAL LOW (ref 3.5–5.1)
Sodium: 135 mmol/L (ref 135–145)

## 2022-03-19 LAB — GLUCOSE, CAPILLARY: Glucose-Capillary: 92 mg/dL (ref 70–99)

## 2022-03-19 LAB — NM MYOCAR MULTI W/SPECT W/WALL MOTION / EF: ST Depression (mm): 0 mm

## 2022-03-19 MED ORDER — TECHNETIUM TC 99M TETROFOSMIN IV KIT
10.0000 | PACK | Freq: Once | INTRAVENOUS | Status: AC | PRN
  Administered 2022-03-19: 10 via INTRAVENOUS

## 2022-03-19 MED ORDER — REGADENOSON 0.4 MG/5ML IV SOLN
INTRAVENOUS | Status: AC
  Filled 2022-03-19: qty 5

## 2022-03-19 MED ORDER — REGADENOSON 0.4 MG/5ML IV SOLN
0.4000 mg | Freq: Once | INTRAVENOUS | Status: AC
  Administered 2022-03-19: 0.4 mg via INTRAVENOUS

## 2022-03-19 MED ORDER — POTASSIUM CHLORIDE 10 MEQ/100ML IV SOLN
10.0000 meq | INTRAVENOUS | Status: AC
  Administered 2022-03-19 (×2): 10 meq via INTRAVENOUS
  Filled 2022-03-19 (×2): qty 100

## 2022-03-19 MED ORDER — TECHNETIUM TC 99M TETROFOSMIN IV KIT
31.1000 | PACK | Freq: Once | INTRAVENOUS | Status: AC | PRN
  Administered 2022-03-19: 31.1 via INTRAVENOUS

## 2022-03-19 NOTE — Plan of Care (Signed)

## 2022-03-19 NOTE — Plan of Care (Signed)

## 2022-03-19 NOTE — Discharge Instr - Other Info (Signed)
NPO since midnight per self report

## 2022-03-19 NOTE — Progress Notes (Signed)
Rounding Note    Patient Name: Alicia [name] Date of Encounter: 03/19/2022  [place] Cardiologist: Janina Mayo, MD   Subjective   Harsh cough this AM frequently neg covid, RSV and flu  No chest pain, had HD last night.  No SOB  Inpatient Medications    Scheduled Meds:  amLODipine  10 mg Oral QPM   calcitRIOL  0.5 mcg Oral Q T,Th,Sa-HD   calcium acetate  1,334 mg Oral TID WC   gabapentin  200 mg Oral Q T,Th,Sat-1800   hydrALAZINE  100 mg Oral BID   metoprolol tartrate  50 mg Oral BID   pantoprazole  40 mg Oral Daily   Continuous Infusions:  PRN Meds: albuterol, benzonatate, guaiFENesin, polyethylene glycol   Vital Signs    Vitals:   03/19/22 0508 03/19/22 0515 03/19/22 0611 03/19/22 0800  BP:  (!) 148/73 (!) 164/79 130/70  Pulse: 74 80 80 78  Resp: (!) 25 (!) '23 20 18  '$ Temp:  98.8 F (37.1 C) 97.7 F (36.5 C) 98.3 F (36.8 C)  TempSrc:  Oral Oral Oral  SpO2: 98% 98% 98% 95%  Weight:   86.7 kg     Intake/Output Summary (Last 24 hours) at 03/19/2022 0806 Last data filed at 03/19/2022 0515 Gross per 24 hour  Intake 21.33 ml  Output 1900 ml  Net -1878.67 ml      03/19/2022    6:11 AM 03/18/2022    5:16 AM 03/05/2022    4:38 PM  Last 3 Weights  Weight (lbs) 191 lb 2.2 oz 179 lb 9.6 oz 154 lb  Weight (kg) 86.7 kg 81.466 kg 69.854 kg      Telemetry    SR with PVCs and PACs - Personally Reviewed  ECG    No new - Personally Reviewed  Physical Exam   GEN: No acute distress.  + cough Neck: No JVD Cardiac: RRR, soft murmur, no rubs, or gallops.  Respiratory: some rhonchi, pt stated she was wheezing earlier to auscultation bilaterally.  GI: Soft, nontender, non-distended , ABD still enlarged MS: No edema; No deformity. Neuro:  Nonfocal  Psych: Normal affect   Labs    High Sensitivity Troponin:   Recent Labs  Lab 03/05/22 1708 03/05/22 1820 03/16/22 1519 03/17/22 2018 03/18/22 0232  TROPONINIHS 24* 42* 48* 2,090*  1,873*     Chemistry Recent Labs  Lab 03/16/22 1519 03/17/22 0408 03/18/22 0232 03/19/22 0520  NA 135 135 131* 135  K 3.7 4.8 4.4 3.2*  CL 93* 91* 91* 99  CO2 '28 31 26 26  '$ GLUCOSE 92 114* 139* 85  BUN 11 21 33* 13  CREATININE 3.71* 4.71* 5.66* 2.47*  CALCIUM 8.6* 9.0 8.9 7.9*  MG 1.9  --   --   --   PROT 7.2  --   --   --   ALBUMIN 3.2* 3.1* 3.0*  --   AST 30  --   --   --   ALT 15  --   --   --   ALKPHOS 84  --   --   --   BILITOT 0.6  --   --   --   GFRNONAA 13* 10* 8* 21*  ANIONGAP '14 13 14 10    '$ Lipids No results for input(s): "CHOL", "TRIG", "HDL", "LABVLDL", "LDLCALC", "CHOLHDL" in the last 168 hours.  Hematology Recent Labs  Lab 03/17/22 0408 03/18/22 0232 03/19/22 0520  WBC 3.5* 4.9 4.3  RBC 3.56* 3.22* 3.18*  HGB 10.1* 9.1* 9.0*  HCT 32.6* 29.7* 28.5*  MCV 91.6 92.2 89.6  MCH 28.4 28.3 28.3  MCHC 31.0 30.6 31.6  RDW 14.6 14.6 14.5  PLT 101* 93* 87*   Thyroid  Recent Labs  Lab 03/16/22 2038  TSH 1.215    BNP Recent Labs  Lab 03/14/22 1508  BNP 309.1*    DDimer No results for input(s): "DDIMER" in the last 168 hours.   Radiology    IR Paracentesis  Result Date: 03/18/2022 INDICATION: Patient with history of end-stage renal disease, cirrhosis recurrent ascites. Request is for therapeutic paracentesis. Maximum 4 L EXAM: ULTRASOUND GUIDED THERAPEUTIC PARACENTESIS MEDICATIONS: Lidocaine 1% 10 mL COMPLICATIONS: None immediate. PROCEDURE: Informed written consent was obtained from the patient after a discussion of the risks, benefits and alternatives to treatment. A timeout was performed prior to the initiation of the procedure. Initial ultrasound scanning demonstrates a moderate amount of ascites within the right lower abdominal quadrant. The right lower abdomen was prepped and draped in the usual sterile fashion. 1% lidocaine was used for local anesthesia. Following this, a 19 gauge, 7-cm, Yueh catheter was introduced. An ultrasound image was saved  for documentation purposes. The paracentesis was performed. The catheter was removed and a dressing was applied. The patient tolerated the procedure well without immediate post procedural complication. FINDINGS: A total of approximately 2 L of straw-colored fluid was removed. IMPRESSION: Successful ultrasound-guided therapeutic paracentesis yielding 2 liters of peritoneal fluid. PLAN: If the patient eventually requires >/=2 paracenteses in a 30 day period, candidacy for formal evaluation by the [facility] will be assessed. Electronically Signed   By: Ruthann Cancer M.D.   On: 03/18/2022 16:01    Cardiac Studies   No new tests ordered   ECHO: 05/28/2021  IMPRESSIONS   1. Left ventricular ejection fraction, by estimation, is 60 to 65%. Left  ventricular ejection fraction by 3D volume is 60 %. The left ventricle has  normal function. The left ventricle has no regional wall motion  abnormalities. There is moderate left  ventricular hypertrophy. Left ventricular diastolic parameters are  consistent with Grade II diastolic dysfunction (pseudonormalization).  Elevated left atrial pressure.   2. Right ventricular systolic function is normal. The right ventricular  size is normal. Tricuspid regurgitation signal is inadequate for assessing  PA pressure.   3. Left atrial size was moderately dilated.   4. The mitral valve is degenerative. Trivial mitral valve regurgitation.   5. The aortic valve is tricuspid. Aortic valve regurgitation is not  visualized. Aortic valve sclerosis is present, with no evidence of aortic  valve stenosis.   6. The inferior vena cava is normal in size with greater than 50%  respiratory variability, suggesting right atrial pressure of 3 mmHg.   Patient Profile     66 y.o. female  hx of paroxysmal atrial fibrillation, breast cancer (stage III R breast cancer '92 s/p surgical resection and adjuvant chemo/XRT, recurrence with  metastatic triple negative BC 6/11/2021on Taxol which was held 2/2 CKD 10/15/2019-followed at [facility]), hx of DVT however eliquis stopped 2/2 anemia and cirrhosis esophageal varices and portal hypertension with recurrent paracentesis, ESRD on IHD Tue Thurs Saturday (right basilic vein fistula has potentially thrombosed, s/p L A-V fistula 64/3329), alcoholic cirrhosis, was admitted 12/09 with atrial fib, RVR.     Assessment & Plan    Atrial fib, RVR - Incorrectly taking apixaban, taking 2.5 x 2 in the morning. - however, takes hydralazine 100 mg bid and  is compliant w/ that - says she can take the Eliquis BID and be consistent - is scared the Afib will happen again  - per Dr Harl Bowie note 07/25/2021: Will cont eliquis and BB (Toprol XL 25 mg). In terms of AAD, amiodarone is not an ideal option with cirrhosis. Dofetilide/sotalol may be options.  - Toprol XL 25 mg changed to lopressor 50 BID   -maintaining SR    2.  Elevated troponin 48>>2,090>>1,873   possible demand ischemia in HD pt with a fib with RVR  stress test myoview today.    3. ESRD on HD - incomplete HD 12/09, mgt per IM, +/- Nephrology- had dialysis last pm   4.  Cirrhosis with paracentesis freq  through [facility] ---2L removed 03/18/22         For questions or updates, please contact [place] Please consult [email or web address] for contact info under        Signed, Cecilie Kicks, NP  03/19/2022, 8:06 AM

## 2022-03-19 NOTE — Progress Notes (Signed)
    Stress portion was completed without complications.  Complete results to follow later today.  Cecilie Kicks, FNP-C [facility] [place]  [phone] or after 5pm and on weekends call [phone] 03/19/2022.now

## 2022-03-19 NOTE — Progress Notes (Signed)
Received patient in bed to unit.  Alert and oriented.  Informed consent signed and in chart.   Treatment initiated: 0143 Treatment completed: 0508  Patient tolerated well.  Transported back to the room  Alert, without acute distress.  Hand-off given to patient's nurse.   Access used: fistula Access issues: none  Total UF removed: 1900 Medication(s) given: none Post HD VS: 98.8 148/73 80 23 98 RA Post HD weight: unable to obtain   [facility]

## 2022-03-19 NOTE — Progress Notes (Signed)
Subjective:  -paracentesis removed 2L yesterday, another 1.9L removed with HD last night -NPO cardiac CTA today -endorsing cough, dyspnea this morning. No chest pain, no palpitations. Had recent viral illness.    Objective:  Vital signs in last 24 hours: Vitals:   03/19/22 0500 03/19/22 0508 03/19/22 0515 03/19/22 0611  BP: 132/78  (!) 148/73 (!) 164/79  Pulse: 78 74 80 80  Resp: 16 (!) 25 (!) 23 20  Temp:   98.8 F (37.1 C) 97.7 F (36.5 C)  TempSrc:   Oral Oral  SpO2: 98% 98% 98% 98%  Weight:    86.7 kg   Physical Exam: Constitutional: awake and alert, sitting comfortably on bed, cooperative, not in acute distress  Cardiovascular: RRR, no mrg  Pulmonary: Normal respiratory effort. Mild diffuse rhonchi and reduced breath sounds in lung bases Abdominal: Soft. Distended. No tenderness. Normal bowel sounds.   Results for orders placed or performed during the hospital encounter of 03/16/22 (from the past 24 hour(s))  CBC     Status: Abnormal   Collection Time: 03/19/22  5:20 AM  Result Value Ref Range   WBC 4.3 4.0 - 10.5 K/uL   RBC 3.18 (L) 3.87 - 5.11 MIL/uL   Hemoglobin 9.0 (L) 12.0 - 15.0 g/dL   HCT 28.5 (L) 36.0 - 46.0 %   MCV 89.6 80.0 - 100.0 fL   MCH 28.3 26.0 - 34.0 pg   MCHC 31.6 30.0 - 36.0 g/dL   RDW 14.5 11.5 - 15.5 %   Platelets 87 (L) 150 - 400 K/uL   nRBC 0.0 0.0 - 0.2 %  Basic metabolic panel     Status: Abnormal   Collection Time: 03/19/22  5:20 AM  Result Value Ref Range   Sodium 135 135 - 145 mmol/L   Potassium 3.2 (L) 3.5 - 5.1 mmol/L   Chloride 99 98 - 111 mmol/L   CO2 26 22 - 32 mmol/L   Glucose, Bld 85 70 - 99 mg/dL   BUN 13 8 - 23 mg/dL   Creatinine, Ser 2.47 (H) 0.44 - 1.00 mg/dL   Calcium 7.9 (L) 8.9 - 10.3 mg/dL   GFR, Estimated 21 (L) >60 mL/min   Anion gap 10 5 - 15    EKG/Telemetry: NSR with occasional PVCs. No afib and no RVR on tele overnight or this morning.  Assessment/Plan:  Principal Problem:   Atrial fibrillation  with RVR (HCC) Active Problems:   Alcoholic cirrhosis of liver with ascites (HCC)   HTN (hypertension)   History of breast cancer   ESRD on dialysis (HCC)  Alicia [name] is a 66 -year-old female with a past medical history of atrial fibrillation, ESRD on HD, alcoholic cirrhosis, HTN, GERD, and metastatic breast cancer in remission who presents with sudden-onset fatigue that began during outpatient HD session and admitted for management of afib w/ RVR. Afib has resolved and she is stable, pending cardiac CTA and associated further management before discharge.   #Atrial fibrillation with rapid ventricular response #Elevated troponin On metoprolol and apixaban at home. Presented w/ acute-onset fatigue during HD session on 12/9. Found to have atrial fibrillation with RVR on admission. Initial Trop 48 and BNP 309 with chest radiograph demonstrating a prominent cardiac silhouette on admission. Suspect multifactorial etiology given recent URI and physiologic stress of HD. Patient has been transitioned from diltiazem drip to metoprolol following resolution of tachycardia.  Patient had elevated troponin of 2090 on 12/10, asymptomatic at that time.  Troponin improved to 1873 morning  of 12/11. Cardiology consulted, suspect demand ischemia but will further evaluate for CAD due to risk. Cardiac CTA planned this morning, NPO. - Metoprolol '50mg'$  BID - Apixaban 2.'5mg'$  BID - Cardiac telemetry - f\u Cardiac CTA, appreciate cardiology recs   #ESRD on HD (TTS) Patient has poor fluid management complicated by cirrhotic ascites. Suspect this is contributing to her decreased tolerance of HD. Paracentesis and HD yesterday removed combined 4L fluid. - HD per nephrology  - Gabapentin '200mg'$  on TTS - Calcitriol 0.5ug on TTS - Calcium acetate '1334mg'$  q8 - Trend BMP    #Pancytopenia Thrombocytopenia dates back to 2022. Suspect secondary to cirrhosis. Leukopenia new as of 11/28. Hgb stable at 9.0 today. Anemia likely  secondary to ESRD. Suspect leukopenia secondary to recent viral infection vs malnutrition, now resolved. - Trend CBC    #Cirrhosis with ascites  Receives paracentesis once per month. Next session scheduled for 12/13. On furosemide and spironolactone PRN for fluid accumulation. MELD score is 22 (19.6% three-month mortality rate). Suspect fluid accumulation likely contributing to decreased HD tolerance. Paracentesis yesterday removed 2L. - Hold furosemide '40mg'$  PRN and spironolactone '25mg'$  PRN   #HTN On amlodipine, hydralazine, and metoprolol at home. Normotensive to Hypertensive today   - Amlodipine '10mg'$   - Hydralazine 100 mg BID - Metoprolol '50mg'$  BID  #Wheezing No wheezing on exam today, albuterol could be contributing to PVC burden - d/c Albuterol   #GERD On calcium carbonate and omeprazole at home.  - Pantoprazole '40mg'$      #History of metastatic breast cancer, in remission Patient follows with oncologist Dr Marylou Mccoy at [facility] Has outpatient visit scheduled for 04/24/2022.  Prior to Admission Living Arrangement: home with family  Anticipated Discharge Location: home with family  Barriers to Discharge: cardiac CTA Dispo: home, pending Cardiac CTA  Linus Galas, MD 03/19/2022, 7:52 AM Pager: [phone] After 5pm on weekdays and 1pm on weekends: On Call pager [phone]

## 2022-03-20 ENCOUNTER — Encounter (HOSPITAL_COMMUNITY): Payer: Self-pay | Admitting: *Deleted

## 2022-03-20 ENCOUNTER — Inpatient Hospital Stay (HOSPITAL_COMMUNITY)
Admission: RE | Admit: 2022-03-20 | Discharge: 2022-03-20 | Disposition: A | Discharge status: Home / Self Care | Payer: Medicare (Managed Care) | Source: Ambulatory Visit | Attending: Physician Assistant | Admitting: Physician Assistant

## 2022-03-20 ENCOUNTER — Other Ambulatory Visit (HOSPITAL_COMMUNITY): Payer: Self-pay

## 2022-03-20 DIAGNOSIS — I2489 Other forms of acute ischemic heart disease: Secondary | ICD-10-CM | POA: Diagnosis not present

## 2022-03-20 DIAGNOSIS — D696 Thrombocytopenia, unspecified: Secondary | ICD-10-CM | POA: Diagnosis not present

## 2022-03-20 DIAGNOSIS — K7460 Unspecified cirrhosis of liver: Secondary | ICD-10-CM | POA: Diagnosis not present

## 2022-03-20 DIAGNOSIS — N186 End stage renal disease: Secondary | ICD-10-CM | POA: Diagnosis not present

## 2022-03-20 DIAGNOSIS — D631 Anemia in chronic kidney disease: Secondary | ICD-10-CM

## 2022-03-20 DIAGNOSIS — I4891 Unspecified atrial fibrillation: Secondary | ICD-10-CM | POA: Diagnosis not present

## 2022-03-20 LAB — CBC
HCT: 29.7 % — ABNORMAL LOW (ref 36.0–46.0)
Hemoglobin: 9.5 g/dL — ABNORMAL LOW (ref 12.0–15.0)
MCH: 27.9 pg (ref 26.0–34.0)
MCHC: 32 g/dL (ref 30.0–36.0)
MCV: 87.4 fL (ref 80.0–100.0)
Platelets: 93 10*3/uL — ABNORMAL LOW (ref 150–400)
RBC: 3.4 MIL/uL — ABNORMAL LOW (ref 3.87–5.11)
RDW: 14.7 % (ref 11.5–15.5)
WBC: 5.2 10*3/uL (ref 4.0–10.5)
nRBC: 0 % (ref 0.0–0.2)

## 2022-03-20 LAB — RENAL FUNCTION PANEL
Albumin: 2.8 g/dL — ABNORMAL LOW (ref 3.5–5.0)
Anion gap: 12 (ref 5–15)
BUN: 19 mg/dL (ref 8–23)
CO2: 24 mmol/L (ref 22–32)
Calcium: 8.6 mg/dL — ABNORMAL LOW (ref 8.9–10.3)
Chloride: 95 mmol/L — ABNORMAL LOW (ref 98–111)
Creatinine, Ser: 3.32 mg/dL — ABNORMAL HIGH (ref 0.44–1.00)
GFR, Estimated: 15 mL/min — ABNORMAL LOW (ref >60)
Glucose, Bld: 93 mg/dL (ref 70–99)
Phosphorus: 2.8 mg/dL (ref 2.5–4.6)
Potassium: 4.2 mmol/L (ref 3.5–5.1)
Sodium: 131 mmol/L — ABNORMAL LOW (ref 135–145)

## 2022-03-20 LAB — GLUCOSE, CAPILLARY: Glucose-Capillary: 144 mg/dL — ABNORMAL HIGH (ref 70–99)

## 2022-03-20 MED ORDER — METOPROLOL TARTRATE 50 MG PO TABS
50.0000 mg | ORAL_TABLET | Freq: Two times a day (BID) | ORAL | 0 refills | Status: AC
  Filled 2022-03-20: qty 60, 30d supply, fill #0

## 2022-03-20 MED ORDER — CHLORHEXIDINE GLUCONATE CLOTH 2 % EX PADS: 6.0000 | MEDICATED_PAD | Freq: Every day | CUTANEOUS | Status: DC

## 2022-03-20 MED ORDER — AMLODIPINE BESYLATE 10 MG PO TABS
10.0000 mg | ORAL_TABLET | Freq: Every evening | ORAL | 0 refills | Status: AC
  Filled 2022-03-20: qty 30, 30d supply, fill #0

## 2022-03-20 NOTE — Care Management Important Message (Signed)
Important Message  Patient Details  Name: Shanyce Daris MRN: [id number] Date of Birth: 1955/08/27   Medicare Important Message Given:  Yes     Shelda Altes 03/20/2022, 11:41 AM

## 2022-03-20 NOTE — Progress Notes (Signed)
Rounding Note    Patient Name: Alicia [name] Date of Encounter: 03/20/2022  [place] Cardiologist: Janina Mayo, MD   Subjective   Patient is sitting on the side of the bed this morning, reports that she feels great this morning, is relieved by the negative Myoview stress test. Denies chest pain, palpitations, shortness of breath this morning.  Inpatient Medications    Scheduled Meds:  amLODipine  10 mg Oral QPM   calcitRIOL  0.5 mcg Oral Q T,Th,Sa-HD   calcium acetate  1,334 mg Oral TID WC   Chlorhexidine Gluconate Cloth  6 each Topical Daily   gabapentin  200 mg Oral Q T,Th,Sat-1800   hydrALAZINE  100 mg Oral BID   metoprolol tartrate  50 mg Oral BID   pantoprazole  40 mg Oral Daily   Continuous Infusions:  PRN Meds: benzonatate, guaiFENesin, polyethylene glycol   Vital Signs    Vitals:   03/19/22 1900 03/19/22 1907 03/19/22 2016 03/20/22 0519  BP: 127/74 (!) 140/81 (!) 147/81 (!) 148/76  Pulse: 85 (!) 38 96 70  Resp: (!) 25 (!) 23    Temp:  97.6 F (36.4 C) 98.4 F (36.9 C) 98.2 F (36.8 C)  TempSrc:  Oral Oral Oral  SpO2: 98% 98% 99% 99%  Weight:    77.5 kg    Intake/Output Summary (Last 24 hours) at 03/20/2022 0857 Last data filed at 03/19/2022 1800 Gross per 24 hour  Intake 91.57 ml  Output --  Net 91.57 ml      03/20/2022    5:19 AM 03/19/2022    6:11 AM 03/18/2022    5:16 AM  Last 3 Weights  Weight (lbs) 170 lb 12.8 oz 191 lb 2.2 oz 179 lb 9.6 oz  Weight (kg) 77.474 kg 86.7 kg 81.466 kg      Telemetry    Sinus rhythm - Personally Reviewed  ECG    No new tracing  Physical Exam   GEN: No acute distress.   Neck: No JVD Cardiac: RRR, no murmurs, rubs, or gallops.  Respiratory: Clear to auscultation bilaterally. GI: Soft, nontender, non-distended  MS: No edema; No deformity. Neuro:  Nonfocal  Psych: Normal affect   Labs    High Sensitivity Troponin:   Recent Labs  Lab 03/05/22 1708 03/05/22 1820  03/16/22 1519 03/17/22 2018 03/18/22 0232  TROPONINIHS 24* 42* 48* 2,090* 1,873*     Chemistry Recent Labs  Lab 03/16/22 1519 03/17/22 0408 03/18/22 0232 03/19/22 0520 03/20/22 0218  NA 135 135 131* 135 131*  K 3.7 4.8 4.4 3.2* 4.2  CL 93* 91* 91* 99 95*  CO2 '28 31 26 26 24  '$ GLUCOSE 92 114* 139* 85 93  BUN 11 21 33* 13 19  CREATININE 3.71* 4.71* 5.66* 2.47* 3.32*  CALCIUM 8.6* 9.0 8.9 7.9* 8.6*  MG 1.9  --   --   --   --   PROT 7.2  --   --   --   --   ALBUMIN 3.2* 3.1* 3.0*  --  2.8*  AST 30  --   --   --   --   ALT 15  --   --   --   --   ALKPHOS 84  --   --   --   --   BILITOT 0.6  --   --   --   --   GFRNONAA 13* 10* 8* 21* 15*  ANIONGAP '14 13 14 10 '$ 12  Lipids No results for input(s): "CHOL", "TRIG", "HDL", "LABVLDL", "LDLCALC", "CHOLHDL" in the last 168 hours.  Hematology Recent Labs  Lab 03/18/22 0232 03/19/22 0520 03/20/22 0703  WBC 4.9 4.3 5.2  RBC 3.22* 3.18* 3.40*  HGB 9.1* 9.0* 9.5*  HCT 29.7* 28.5* 29.7*  MCV 92.2 89.6 87.4  MCH 28.3 28.3 27.9  MCHC 30.6 31.6 32.0  RDW 14.6 14.5 14.7  PLT 93* 87* 93*   Thyroid  Recent Labs  Lab 03/16/22 2038  TSH 1.215    BNP Recent Labs  Lab 03/14/22 1508  BNP 309.1*    DDimer No results for input(s): "DDIMER" in the last 168 hours.   Radiology    NM Myocar Multi W/Spect W/Wall Motion / EF  Result Date: 03/19/2022 CLINICAL DATA:  66 year old female with elevated troponins. Chest pain. EXAM: MYOCARDIAL IMAGING WITH SPECT (REST AND PHARMACOLOGIC-STRESS) GATED LEFT VENTRICULAR WALL MOTION STUDY LEFT VENTRICULAR EJECTION FRACTION TECHNIQUE: Standard myocardial SPECT imaging was performed after resting intravenous injection of 10 mCi Tc-66mtetrofosmin. Subsequently, intravenous infusion of Lexiscan was performed under the supervision of the Cardiology staff. At peak effect of the drug, 30 mCi Tc-967metrofosmin was injected intravenously and standard myocardial SPECT imaging was performed. Quantitative  gated imaging was also performed to evaluate left ventricular wall motion, and estimate left ventricular ejection fraction. COMPARISON:  None Available. FINDINGS: Perfusion: No decreased activity in the left ventricle on stress imaging to suggest reversible ischemia or infarction. Wall Motion: Normal left ventricular wall motion. No left ventricular dilation. Left Ventricular Ejection Fraction: 63 % End diastolic volume 11045l End systolic volume 41 ml IMPRESSION: 1. No reversible ischemia or infarction. 2. Normal left ventricular wall motion. 3. Left ventricular ejection fraction 63% 4. Non invasive risk stratification*: Low *2012 Appropriate Use Criteria for Coronary Revascularization Focused Update: J Am Coll Cardiol. 209977;41(4):239-532http://content.[email or web address]?articleid=[email or web address] Electronically Signed   By: StSuzy Bouchard.D.   On: 03/19/2022 14:34   IR Paracentesis  Result Date: 03/18/2022 INDICATION: Patient with history of end-stage renal disease, cirrhosis recurrent ascites. Request is for therapeutic paracentesis. Maximum 4 L EXAM: ULTRASOUND GUIDED THERAPEUTIC PARACENTESIS MEDICATIONS: Lidocaine 1% 10 mL COMPLICATIONS: None immediate. PROCEDURE: Informed written consent was obtained from the patient after a discussion of the risks, benefits and alternatives to treatment. A timeout was performed prior to the initiation of the procedure. Initial ultrasound scanning demonstrates a moderate amount of ascites within the right lower abdominal quadrant. The right lower abdomen was prepped and draped in the usual sterile fashion. 1% lidocaine was used for local anesthesia. Following this, a 19 gauge, 7-cm, Yueh catheter was introduced. An ultrasound image was saved for documentation purposes. The paracentesis was performed. The catheter was removed and a dressing was applied. The patient tolerated the procedure well without immediate post procedural complication. FINDINGS: A total of  approximately 2 L of straw-colored fluid was removed. IMPRESSION: Successful ultrasound-guided therapeutic paracentesis yielding 2 liters of peritoneal fluid. PLAN: If the patient eventually requires >/=2 paracenteses in a 30 day period, candidacy for formal evaluation by the [facility] will be assessed. Electronically Signed   By: DyRuthann Cancer.D.   On: 03/18/2022 16:01    Cardiac Studies   03/19/22 Myoview Stress  FINDINGS: Perfusion: No decreased activity in the left ventricle on stress imaging to suggest reversible ischemia or infarction.   Wall Motion: Normal left ventricular wall motion. No left ventricular dilation.   Left Ventricular Ejection Fraction: 63 %   End diastolic volume  631 ml   End systolic volume 41 ml   IMPRESSION: 1. No reversible ischemia or infarction.   2. Normal left ventricular wall motion.   3. Left ventricular ejection fraction 63%   4. Non invasive risk stratification*: Low  Patient Profile     66 y.o. female  hx of paroxysmal atrial fibrillation, breast cancer (stage III R breast cancer '92 s/p surgical resection and adjuvant chemo/XRT, recurrence with metastatic triple negative BC 6/11/2021on Taxol which was held 2/2 CKD 10/15/2019-followed [facility]), hx of DVT however eliquis stopped 2/2 anemia and cirrhosis esophageal varices and portal hypertension with recurrent paracentesis, ESRD on IHD Tue Thurs Saturday (right basilic vein fistula has potentially thrombosed, s/p L A-V fistula 49/7026), alcoholic cirrhosis, was admitted 12/09 with atrial fib, RVR.    Assessment & Plan    Atrial fibrillation, rapid ventricular response Secondary hypercoagulable state  Patient initially presenting with afib and RVR, converted to normal sinus after receiving IV diltiazem. Has since maintained sinus rhythm on beta blocker therapy.   Continue Eliquis 2.'5mg'$  BID. Reviewed the importance of twice daily dosing  with patient. Continue Metoprolol. Patient taking Toprol XL '25mg'$  at home increased to Lopressor '50mg'$  BID this admission. Would consider consolidating back to Toprol at discharge. Does not appear to need antiarrhythmic agent at this time. Options limited 2/2 cirrhosis/ESRD.  Elevated troponin  Patient with elevated troponin, 48, 2090, 1873. ECG without ischemic changes. Stress myoview completed 12/12, no decreased activity in the left ventricle on stress imaging to suggest reversible ischemia or infarction. Patient without chest pain or dyspnea. She is already scheduled for outpatient cardiology follow up next week with Dr. Harl Bowie.   Hypertension  Patient on Hydralazine '100mg'$  BID, Metoprolol (now tartrate '50mg'$  BID). Remains hypertensive. Could consider adjusting Hydralazine to TID dosing given short half-life though compliance could be a concern.   ESRD on HD  Management per primary team/nephrology.  Cirrhosis  Followed at [facility]. S/P thoracentesis on 12/11.      For questions or updates, please contact [place] Please consult [email or web address] for contact info under        Signed, Lily Kocher, PA-C  03/20/2022, 8:57 AM

## 2022-03-20 NOTE — Progress Notes (Addendum)
CSW received consult for patient for transportation assistance. Patient reports she has no family or friends that can provide transportation at dc.CSW provided RN with taxi voucher for patient. Patient signed [employer]. Original placed in patients hard chart.

## 2022-03-20 NOTE — Progress Notes (Signed)
  [facility] Progress Note   Subjective:   Seen in room. Feeling much better. Had HD and paracentesis yesterday. 2L yield w/ the paracentesis.   Objective Vitals:   03/19/22 1900 03/19/22 1907 03/19/22 2016 03/20/22 0519  BP: 127/74 (!) 140/81 (!) 147/81 (!) 148/76  Pulse: 85 (!) 38 96 70  Resp: (!) 25 (!) 23    Temp:  97.6 F (36.4 C) 98.4 F (36.9 C) 98.2 F (36.8 C)  TempSrc:  Oral Oral Oral  SpO2: 98% 98% 99% 99%  Weight:    77.5 kg   Physical Exam General: Alert, pleasant female in NAD Heart: RRR, no murmurs, rubs or gallops Lungs: CTA bilaterally without wheezing, rhonchi or rales Abdomen: Soft, moderately distended, +BS, mild ascites Extremities: No edema b/l lower extremities Dialysis Access: LUE AVF + bruit  Dialysis Orders: TTS at [facility] 3.5h   400/A1.5  80.5kg   2K/2Ca  AVF   Heparin 2000 bolus - no ESA - venofer '50mg'$  IV weekly - Calcitriol 1.48mg PO q HD - Korsuva q HD for itching  Assessment/Plan:  A-fib RVR: Triggered by dialysis 12/9 and many other times she "does not tolerate dialysis" with similar symptoms. She was volume up in general. Cardiology consulted, adjusting medications. On Eliquis. Had HD and paracentesis yesterday, now she is back in NSR today.   ESRD - on HD TTS. Had to end the past 2 outpt HD sessions due to dyspnea/CP. Had extra HD yest, back on sched for regular HD today/ tonight.   BP/volume: BP's normal to high now, better. Initial CXR w/ fluid. Had HD here yest w/ 2 L UF, probably could have taken off more. Is due for regular HD today/ tonight. Plan max UF as tolerated. Need standing wt as well. On room air and min edema by exam today.   Anemia: Hgb 9.1 today. Hx breast cancer, unclear if this is why she is not ESA  Metabolic bone disease: Ca/Phos ok.   RKelly Splinter MD 03/19/2022, 8:21 AM  Recent Labs  Lab 03/18/22 0232 03/19/22 0520 03/20/22 0218 03/20/22 0703  HGB 9.1* 9.0*  --  9.5*  ALBUMIN 3.0*  --  2.8*  --    CALCIUM 8.9 7.9* 8.6*  --   PHOS 4.6  --  2.8  --   CREATININE 5.66* 2.47* 3.32*  --   K 4.4 3.2* 4.2  --      Inpatient medications:  amLODipine  10 mg Oral QPM   calcitRIOL  0.5 mcg Oral Q T,Th,Sa-HD   calcium acetate  1,334 mg Oral TID WC   Chlorhexidine Gluconate Cloth  6 each Topical Daily   gabapentin  200 mg Oral Q T,Th,Sat-1800   hydrALAZINE  100 mg Oral BID   metoprolol tartrate  50 mg Oral BID   pantoprazole  40 mg Oral Daily     benzonatate, guaiFENesin, polyethylene glycol

## 2022-03-20 NOTE — Progress Notes (Signed)
  [facility] Progress Note   Subjective:   Seen in room. No c/o today, going home she says.   Objective Vitals:   03/19/22 1907 03/19/22 2016 03/20/22 0519 03/20/22 0915  BP: (!) 140/81 (!) 147/81 (!) 148/76 (!) 150/64  Pulse: (!) 38 96 70 76  Resp: (!) 23   18  Temp: 97.6 F (36.4 C) 98.4 F (36.9 C) 98.2 F (36.8 C) 97.7 F (36.5 C)  TempSrc: Oral Oral Oral Oral  SpO2: 98% 99% 99% 98%  Weight:   77.5 kg    Physical Exam General: Alert, pleasant female in NAD Heart: RRR, no murmurs, rubs or gallops Lungs: CTA bilaterally without wheezing, rhonchi or rales Abdomen: Soft, moderately distended, +BS, mild ascites Extremities: No edema b/l lower extremities Dialysis Access: LUE AVF + bruit  Dialysis Orders: TTS at [facility] 3.5h   400/A1.5  80.5kg   2K/2Ca  AVF   Heparin 2000 bolus - no ESA - venofer '50mg'$  IV weekly - Calcitriol 1.16mg PO q HD - Korsuva q HD for itching  Assessment/Plan:  A-fib RVR: Triggered by dialysis 12/9 and many other times she "does not tolerate dialysis" with similar symptoms. She was volume up in general. Cardiology consulted, adjusting medications. On Eliquis. Had HD and paracentesis 12/11 then another HD 12/12. , now she is back in NSR today.   ESRD - on HD TTS.  Had extra HD here Monday and reg HD yesterday.  BP/volume: BP's normal to high now, better. Initial CXR w/ fluid, now lungs clear/ euvolemic after HD x 2. Under dry wt by 3kg, lower edw upon discharge.   Anemia: Hgb 9.1 today. Hx breast cancer, unclear if this is why she is not ESA  Metabolic bone disease: Ca/Phos ok.  Dispo - for dc today (per patient)  RKelly Splinter MD 03/19/2022, 10:42 AM  Recent Labs  Lab 03/18/22 0232 03/19/22 0520 03/20/22 0218 03/20/22 0703  HGB 9.1* 9.0*  --  9.5*  ALBUMIN 3.0*  --  2.8*  --   CALCIUM 8.9 7.9* 8.6*  --   PHOS 4.6  --  2.8  --   CREATININE 5.66* 2.47* 3.32*  --   K 4.4 3.2* 4.2  --      Inpatient medications:  amLODipine   10 mg Oral QPM   calcitRIOL  0.5 mcg Oral Q T,Th,Sa-HD   calcium acetate  1,334 mg Oral TID WC   Chlorhexidine Gluconate Cloth  6 each Topical Daily   gabapentin  200 mg Oral Q T,Th,Sat-1800   hydrALAZINE  100 mg Oral BID   metoprolol tartrate  50 mg Oral BID   pantoprazole  40 mg Oral Daily     benzonatate, guaiFENesin, polyethylene glycol

## 2022-03-20 NOTE — Progress Notes (Signed)
D/C order noted. Contacted [facility] to advise clinic of pt's d/c today and that pt will resume care tomorrow.   Alicia [name] Renal Navigator [phone] 

## 2022-03-20 NOTE — Progress Notes (Signed)
[facility] Progress Note    Subjective:   Seen in room, reports feeling much better today. No SOB, CP, palpitations, dizziness, abdominal pain or nausea.    Objective       Vitals:    03/17/22 1932 03/17/22 2113 03/17/22 2318 03/18/22 0516  BP: (!) 198/93 (!) 183/89 (!) 167/97 (!) 173/87  Pulse: 88 90 81 85  Resp:   '18 17 16  '$ Temp:   98.4 F (36.9 C) 98.5 F (36.9 C) 98.2 F (36.8 C)  TempSrc:   Oral Oral Oral  SpO2: 96% 96% 93% 92%  Weight:       81.5 kg    Physical Exam General: Alert, pleasant female in NAD Heart: RRR, no murmurs, rubs or gallops Lungs: CTA bilaterally without wheezing, rhonchi or rales Abdomen: Soft, moderately distended, +BS Extremities: No edema b/l lower extremities Dialysis Access: LUE AVF + bruit   Additional Objective Labs: Basic Metabolic Panel: Last Labs       Recent Labs  Lab 03/16/22 1519 03/17/22 0408 03/18/22 0232  NA 135 135 131*  K 3.7 4.8 4.4  CL 93* 91* 91*  CO2 '28 31 26  '$ GLUCOSE 92 114* 139*  BUN 11 21 33*  CREATININE 3.71* 4.71* 5.66*  CALCIUM 8.6* 9.0 8.9  PHOS  --  4.5 4.6      Liver Function Tests: Last Labs       Recent Labs  Lab 03/16/22 1519 03/17/22 0408 03/18/22 0232  AST 30  --   --   ALT 15  --   --   ALKPHOS 84  --   --   BILITOT 0.6  --   --   PROT 7.2  --   --   ALBUMIN 3.2* 3.1* 3.0*      Last Labs  No results for input(s): "LIPASE", "AMYLASE" in the last 168 hours.   CBC: Last Labs        Recent Labs  Lab 03/14/22 1507 03/16/22 1519 03/17/22 0408 03/18/22 0232  WBC 1.8* 2.2* 3.5* 4.9  NEUTROABS 0.8* 1.5* 1.9 3.6  HGB 9.6* 9.5* 10.1* 9.1*  HCT 31.1* 30.7* 32.6* 29.7*  MCV 90.7 91.6 91.6 92.2  PLT 87* 85* 101* 93*      Blood Culture Labs (Brief)           Component Value Date/Time    SDES   10/13/2021 0500      FLUID Performed at [facility], [address], [place], [place] [address]      [facility]   10/13/2021 0500      RIGHT  KNEE Performed at [facility], [address], [place], [place] [address]      CULT   10/13/2021 0500      NO GROWTH 3 DAYS Performed at [facility] [address] [place], [place] [address]      REPTSTATUS 10/16/2021 FINAL 10/13/2021 0500        Cardiac Enzymes: Last Labs  No results for input(s): "CKTOTAL", "CKMB", "CKMBINDEX", "TROPONINI" in the last 168 hours.   CBG: Last Labs     Recent Labs  Lab 03/17/22 0753  GLUCAP 96      Iron Studies:  Recent Labs (last 2 labs)  No results for input(s): "IRON", "TIBC", "TRANSFERRIN", "FERRITIN" in the last 72 hours.   '@lablastinr3'$ @ Studies/Results:  Imaging Results (Last 48 hours)  DG Chest Portable 1 View   Result Date: 03/16/2022 CLINICAL DATA:  chest pain, dialysis  EXAM: PORTABLE CHEST 1 VIEW COMPARISON:  03/14/2022 FINDINGS: Cardiac silhouette is prominent. There is pulmonary interstitial prominence with vascular congestion. No focal consolidation. No pneumothorax or pleural effusion identified. Aorta is calcified. Right-sided Port-A-Cath tip overlies distal SVC. IMPRESSION: Findings suggest CHF. Electronically Signed   By: Sammie Bench M.D.   On: 03/16/2022 15:06     Medications:    amLODipine  10 mg Oral QPM   [START ON 03/19/2022] calcitRIOL  0.5 mcg Oral Q T,Th,Sa-HD   calcium acetate  1,334 mg Oral TID WC   Chlorhexidine Gluconate Cloth  6 each Topical Daily   [START ON 03/19/2022] gabapentin  200 mg Oral Q T,Th,Sat-1800   hydrALAZINE  100 mg Oral BID   metoprolol tartrate  50 mg Oral BID   pantoprazole  40 mg Oral Daily      Dialysis Orders: TTS at [facility] 3:30hr, 400/A1.5, EDW 80.5kg, 2K/2Ca, AVF, heparin 2000 bolus - no ESA - venofer '50mg'$  IV weekly - Calcitriol 1.56mg PO q HD - Korsuva q HD for itching   Assessment/Plan:  A-fib RVR: Triggered by dialysis 12/9 and many other times she "does not tolerate dialysis" with similar symptoms - wonder if having transient RVR?  Electrolytes ok today. She is volume up in general, due for paracentesis. Cardiology consulted, adjusting medications. On Eliquis.  ESRD:  Usual TTS schedule - had to end the past 2 HD with dyspnea/CP. Wonder if being up on fluid and due for paracentesis is triggering this. I would favor going ahead with paracentesis today as opposed to waiting until Wed and we can dialyze her after and see if she tolerates better.   Hypertension/volume: BP high - CXR with fluid as well as significant ascites on exam.  Anemia: Hgb 9.1 today. Hx breast cancer, unclear if this is why she is not ESA, will check with her home clinic.   Metabolic bone disease: Ca/Phos ok.    SAnice Paganini PA-C 03/18/2022, 8:22 AM  [facility] Pager: [phone]

## 2022-03-27 ENCOUNTER — Ambulatory Visit (HOSPITAL_COMMUNITY)
Admission: RE | Admit: 2022-03-27 | Discharge: 2022-03-27 | Disposition: A | Discharge status: Home / Self Care | Payer: Medicare (Managed Care) | Source: Ambulatory Visit | Attending: Physician Assistant | Admitting: Physician Assistant

## 2022-03-27 VITALS — BP 106/46 | HR 68 | Ht 60.0 in | Wt 169.8 lb

## 2022-03-27 DIAGNOSIS — I480 Paroxysmal atrial fibrillation: Secondary | ICD-10-CM

## 2022-03-27 DIAGNOSIS — Z992 Dependence on renal dialysis: Secondary | ICD-10-CM | POA: Insufficient documentation

## 2022-03-27 DIAGNOSIS — K7460 Unspecified cirrhosis of liver: Secondary | ICD-10-CM | POA: Diagnosis not present

## 2022-03-27 DIAGNOSIS — Z7901 Long term (current) use of anticoagulants: Secondary | ICD-10-CM | POA: Diagnosis not present

## 2022-03-27 DIAGNOSIS — Z87891 Personal history of nicotine dependence: Secondary | ICD-10-CM | POA: Diagnosis not present

## 2022-03-27 DIAGNOSIS — Z79899 Other long term (current) drug therapy: Secondary | ICD-10-CM | POA: Insufficient documentation

## 2022-03-27 DIAGNOSIS — I120 Hypertensive chronic kidney disease with stage 5 chronic kidney disease or end stage renal disease: Secondary | ICD-10-CM | POA: Diagnosis not present

## 2022-03-27 DIAGNOSIS — N186 End stage renal disease: Secondary | ICD-10-CM | POA: Insufficient documentation

## 2022-03-27 MED ORDER — DILTIAZEM HCL 30 MG PO TABS: ORAL_TABLET | ORAL | 0 refills | Status: AC

## 2022-03-27 NOTE — Progress Notes (Signed)
Primary Care Physician: Juanda Chance Primary Cardiologist: Dr Phineas Inches Primary Electrophysiologist: none Referring Physician: Cecilie Kicks NP   Alicia [name] is a 66 y.o. female with a history of ESRD, HTN, breast cancer, cirrhosis with recurrent paracentesis, esophageal varices, atrial fibrillation who presents for consultation in the [facility].  The patient was initially diagnosed with atrial fibrillation early 2023. Patient was started on Eliquis for a CHADS2VASC score of 2. She was admitted 03/16/22 after she was noted to have a fast heart rate during HD. She was started on a diltiazem drip which converted her back to SR. She states that she was unaware of her arrhythmia at the time.   Today, she denies symptoms of palpitations, chest pain, shortness of breath, orthopnea, PND, lower extremity edema, dizziness, presyncope, syncope, snoring, daytime somnolence, bleeding, or neurologic sequela. The patient is tolerating medications without difficulties and is otherwise without complaint today.    Atrial Fibrillation Risk Factors:  she does not have symptoms or diagnosis of sleep apnea. she does not have a history of rheumatic fever. she does have a history of alcohol use.   she has a BMI of Body mass index is 33.16 kg/m.Marland Kitchen Filed Weights   03/27/22 1502  Weight: 77 kg    Family History  Problem Relation Age of Onset   Diabetes Mother    Diabetes Father    Diabetes Brother    Colon cancer Neg Hx    Stomach cancer Neg Hx    Esophageal cancer Neg Hx      Atrial Fibrillation Management history:  Previous antiarrhythmic drugs: none Previous cardioversions: none Previous ablations: none CHADS2VASC score: 2 Anticoagulation history: Eliquis   Past Medical History:  Diagnosis Date   Anemia    Anxiety    Ascites    Breast cancer ([place])    mets   Cancer ([place])    stomach   Chronic anticoagulation    reported on for DVT  prophylaxis, possibly in setting of cancer, but Eliquis d/c'd 08/2016 in setting of anemia/liver disease/acites   Cirrhosis ([place])    CKD (chronic kidney disease), stage III ([place])    Dailysis T-TH-S   Dyspnea    with fluid buildup, no current problems 01/10/21   Esophageal varices ([place]) 11/2016   Small noted on ENDO   GERD (gastroesophageal reflux disease)    Headache    otc med prn   History of blood transfusion 08/2020   Hypertension    Hypotension    Pneumonia    Portal hypertension ([place])    Past Surgical History:  Procedure Laterality Date   ABDOMINAL HYSTERECTOMY     AV FISTULA PLACEMENT Right 09/01/2020   Procedure: RIGHT ARM ARTERIOVENOUS 1st STAGE BASILIC;  Surgeon: Marty Heck, MD;  Location: [place];  Service: Vascular;  Laterality: Right;   AV FISTULA PLACEMENT Left 02/12/2021   Procedure: LEFT ARM FIRST STAGE BASILIC ARTERIOVENOUS (AV) FISTULA CREATION;  Surgeon: Marty Heck, MD;  Location: [place];  Service: Vascular;  Laterality: Left;   [place] Right 01/12/2021   Procedure: RIGHT ARM SECOND STAGE BASILIC VEIN TRANSPOSITION;  Surgeon: Marty Heck, MD;  Location: [place];  Service: Vascular;  Laterality: Right;   [place] Left 09/05/2021   Procedure: SECOND STAGE LEFT BASILIC VEIN TRANSPOSITION;  Surgeon: Marty Heck, MD;  Location: [facility] OR;  Service: Vascular;  Laterality: Left;   BREAST LUMPECTOMY     BREAST SURGERY  CESAREAN SECTION     COLONOSCOPY WITH PROPOFOL N/A 05/25/2019   Procedure: COLONOSCOPY WITH PROPOFOL;  Surgeon: Wilford Corner, MD;  Location: [facility];  Service: Endoscopy;  Laterality: N/A;   ESOPHAGOGASTRODUODENOSCOPY (EGD) WITH PROPOFOL N/A 11/21/2016   Procedure: ESOPHAGOGASTRODUODENOSCOPY;  Surgeon: Milus Banister, MD;  Location: [facility];  Service: Endoscopy;  Laterality: N/A;   ESOPHAGOGASTRODUODENOSCOPY (EGD) WITH PROPOFOL N/A 05/25/2019   Procedure:  ESOPHAGOGASTRODUODENOSCOPY (EGD) WITH PROPOFOL;  Surgeon: Wilford Corner, MD;  Location: [facility];  Service: Endoscopy;  Laterality: N/A;   INSERTION OF DIALYSIS CATHETER Left 09/01/2020   Procedure: INSERTION OF A LEFT INTERNAL JUGULAR TUNNELLED DIALYSIS CATHETER. ULTRASOUND GUIDED;  Surgeon: Marty Heck, MD;  Location: [place];  Service: Vascular;  Laterality: Left;   IR PARACENTESIS  09/04/2016   IR PARACENTESIS  09/20/2016   IR PARACENTESIS  10/22/2016   IR PARACENTESIS  01/27/2017   IR PARACENTESIS  04/11/2017   IR PARACENTESIS  06/05/2017   IR PARACENTESIS  06/05/2017   IR PARACENTESIS  06/19/2017   IR PARACENTESIS  07/03/2017   IR PARACENTESIS  07/17/2017   IR PARACENTESIS  08/01/2017   IR PARACENTESIS  08/14/2017   IR PARACENTESIS  08/28/2017   IR PARACENTESIS  09/11/2017   IR PARACENTESIS  09/25/2017   IR PARACENTESIS  10/08/2017   IR PARACENTESIS  10/23/2017   IR PARACENTESIS  11/06/2017   IR PARACENTESIS  11/20/2017   IR PARACENTESIS  12/04/2017   IR PARACENTESIS  12/18/2017   IR PARACENTESIS  01/19/2018   IR PARACENTESIS  02/09/2018   IR PARACENTESIS  02/24/2018   IR PARACENTESIS  03/16/2018   IR PARACENTESIS  04/10/2018   IR PARACENTESIS  05/08/2018   IR PARACENTESIS  06/11/2018   IR PARACENTESIS  07/07/2018   IR PARACENTESIS  09/04/2018   IR PARACENTESIS  10/22/2018   IR PARACENTESIS  11/30/2018   IR PARACENTESIS  01/14/2019   IR PARACENTESIS  04/30/2019   IR PARACENTESIS  07/19/2019   IR PARACENTESIS  09/05/2020   IR PARACENTESIS  01/16/2022   IR PARACENTESIS  02/18/2022   IR PARACENTESIS  03/18/2022   IR RADIOLOGIST EVAL & MGMT  12/23/2017   LYMPH NODE DISSECTION     ULTRASOUND GUIDANCE FOR VASCULAR ACCESS Left 09/01/2020   Procedure: ULTRASOUND GUIDANCE FOR VASCULAR ACCESS;  Surgeon: Marty Heck, MD;  Location: [facility];  Service: Vascular;  Laterality: Left;    Current Outpatient Medications  Medication Sig Dispense Refill    acetaminophen (TYLENOL) 500 MG tablet Take 1,000 mg by mouth every 6 (six) hours as needed for moderate pain or headache.     amLODipine (NORVASC) 10 MG tablet Take 1 tablet (10 mg total) by mouth every evening. 30 tablet 0   apixaban (ELIQUIS) 2.5 MG TABS tablet Take 1 tablet (2.5 mg total) by mouth 2 (two) times daily. 180 tablet 1   aspirin-acetaminophen-caffeine (EXCEDRIN MIGRAINE) 250-250-65 MG tablet Take 2 tablets by mouth every 6 (six) hours as needed for headache.     Azelastine HCl 137 MCG/SPRAY SOLN Place 2 sprays into both nostrils as needed.     calcitRIOL (ROCALTROL) 0.5 MCG capsule Take 0.5 mcg by mouth Every Tuesday,Thursday,and Saturday with dialysis.     Calcium Acetate 667 MG TABS Take 1,334 mg by mouth 3 (three) times daily with meals.     calcium carbonate (TUMS - DOSED IN MG ELEMENTAL CALCIUM) 500 MG chewable tablet Chew 2 tablets by mouth 3 (three) times daily as needed for indigestion or  heartburn.     gabapentin (NEURONTIN) 100 MG capsule Take 200 mg by mouth See admin instructions. Take 200 mg by mouth before dialysis on Tues/Thurs/Sat     guaiFENesin (MUCINEX) 600 MG 12 hr tablet Take 600 mg by mouth 2 (two) times daily as needed for to loosen phlegm.     hydrALAZINE (APRESOLINE) 100 MG tablet Take 100 mg by mouth 2 (two) times daily.     lidocaine-prilocaine (EMLA) cream Apply 1 Application topically as needed (dialysis).     loperamide (IMODIUM) 2 MG capsule Take 4 mg by mouth daily as needed for diarrhea or loose stools.     metoprolol tartrate (LOPRESSOR) 50 MG tablet Take 1 tablet (50 mg total) by mouth 2 (two) times daily. 60 tablet 0   Phenylephrine-APAP-guaiFENesin (SINUS CONGESTION/PAIN DAYTIME) 5-325-200 MG TABS Take 1 tablet by mouth daily as needed (sinus issues.).     PRILOSEC OTC 20 MG tablet Take 20 mg by mouth daily as needed (for heartburn or reflux).     sodium chloride (OCEAN) 0.65 % SOLN nasal spray Place 1 spray into both nostrils as needed for  congestion.     No current facility-administered medications for this encounter.    Allergies  Allergen Reactions   Penicillins Other (See Comments)    Childhood reaction    Antivert [Meclizine] Anxiety   Compazine [Prochlorperazine Edisylate] Anxiety   Flexeril [Cyclobenzaprine] Anxiety    Social History   Socioeconomic History   Marital status: Single    Spouse name: Not on file   Number of children: Not on file   Years of education: Not on file   Highest education level: Not on file  Occupational History   Not on file  Tobacco Use   Smoking status: Former    Types: Cigars   Smokeless tobacco: Never   Tobacco comments:    Cigar 2-3 times a year   Vaping Use   Vaping Use: Never used  Substance and Sexual Activity   Alcohol use: Not Currently    Comment: occassional wine, no ETOH since 11/2020   Drug use: No   Sexual activity: Not on file    Comment: Hysterectomy  Other Topics Concern   Not on file  Social History Narrative   Not on file   Social Determinants of Health   Financial Resource Strain: Not on file  Food Insecurity: Not on file  Transportation Needs: Not on file  Physical Activity: Not on file  Stress: Not on file  Social Connections: Not on file  Intimate Partner Violence: Not on file     ROS- All systems are reviewed and negative except as per the HPI above.  Physical Exam: Vitals:   03/27/22 1502  BP: (!) 106/46  Pulse: 68  Weight: 77 kg  Height: 5' (1.524 m)    GEN- The patient is a well appearing female, alert and oriented x 3 today.   Head- normocephalic, atraumatic Eyes-  Sclera clear, conjunctiva pink Ears- hearing intact Oropharynx- clear Neck- supple  Lungs- Clear to ausculation bilaterally, normal work of breathing Heart- Regular rate and rhythm, no murmurs, rubs or gallops  GI- soft, NT, ND, + BS Extremities- no clubbing, cyanosis, or edema MS- no significant deformity or atrophy Skin- no rash or lesion Psych-  euthymic mood, full affect Neuro- strength and sensation are intact  Wt Readings from Last 3 Encounters:  03/27/22 77 kg  03/20/22 77.5 kg  03/05/22 69.9 kg    EKG today demonstrates  SR  Vent. rate 68 BPM PR interval 158 ms QRS duration 72 ms QT/QTcB 446/474 ms  Echo 06/01/21 demonstrated   1. Left ventricular ejection fraction, by estimation, is 60 to 65%. Left  ventricular ejection fraction by 3D volume is 60 %. The left ventricle has  normal function. The left ventricle has no regional wall motion  abnormalities. There is moderate left ventricular hypertrophy. Left ventricular diastolic parameters are consistent with Grade II diastolic dysfunction (pseudonormalization).  Elevated left atrial pressure.   2. Right ventricular systolic function is normal. The right ventricular  size is normal. Tricuspid regurgitation signal is inadequate for assessing  PA pressure.   3. Left atrial size was moderately dilated.   4. The mitral valve is degenerative. Trivial mitral valve regurgitation.   5. The aortic valve is tricuspid. Aortic valve regurgitation is not  visualized. Aortic valve sclerosis is present, with no evidence of aortic  valve stenosis.   6. The inferior vena cava is normal in size with greater than 50%  respiratory variability, suggesting right atrial pressure of 3 mmHg.   Epic records are reviewed at length today  CHA2DS2-VASc Score = 2  The patient's score is based upon: CHF History: 0 HTN History: 1 Diabetes History: 0 Stroke History: 0 Vascular Disease History: 0 Age Score: 0 Gender Score: 1       ASSESSMENT AND PLAN: 1. Paroxysmal Atrial Fibrillation (ICD10:  I48.0) The patient's CHA2DS2-VASc score is 2, indicating a 2.2% annual risk of stroke.   Patient in [place] today.  Unfortunately, her rhythm control options are quite limited. She is not a candidate for dofetilide or sotalol with ESRD. Would also be hesitant to use amiodarone given her liver cirrhosis  with ascites. She is not a candidate for ablation. Rate control may be her best option. Continue Lopressor 50 mg BID Continue Eliquis 2.5 mg BID. She qualifies for the 5 mg dose (age < 82, weight >60 kg) will defer to her primary cardiologist.  Will start diltiazem 30 mg PRN q 4 hours for heart racing since this converted her in the ED.   2. ESRD On HD TTS  3. HTN Stable, no changes today.    Follow up with Dr Harl Bowie in 2-3 months.    [facility] [address] [place], [place] [address] [phone] 03/27/2022 3:31 PM

## 2022-03-27 NOTE — Patient Instructions (Signed)
Cardizem 30mg -- Take 1 tablet every 4 hours AS NEEDED for AFIB heart rate >100 as long as top BP >100.    

## 2022-04-17 ENCOUNTER — Other Ambulatory Visit (HOSPITAL_COMMUNITY): Payer: Medicare (Managed Care)

## 2022-04-18 ENCOUNTER — Emergency Department (HOSPITAL_COMMUNITY): Payer: Medicare (Managed Care)

## 2022-04-18 ENCOUNTER — Other Ambulatory Visit: Payer: Self-pay

## 2022-04-18 ENCOUNTER — Emergency Department (HOSPITAL_COMMUNITY)
Admission: EM | Admit: 2022-04-18 | Discharge: 2022-04-18 | Disposition: A | Discharge status: Home / Self Care | Payer: Medicare (Managed Care) | Attending: Emergency Medicine | Admitting: Emergency Medicine

## 2022-04-18 ENCOUNTER — Encounter (HOSPITAL_COMMUNITY): Payer: Self-pay

## 2022-04-18 DIAGNOSIS — Z992 Dependence on renal dialysis: Secondary | ICD-10-CM | POA: Insufficient documentation

## 2022-04-18 DIAGNOSIS — Z7982 Long term (current) use of aspirin: Secondary | ICD-10-CM | POA: Insufficient documentation

## 2022-04-18 DIAGNOSIS — I4891 Unspecified atrial fibrillation: Secondary | ICD-10-CM | POA: Diagnosis not present

## 2022-04-18 DIAGNOSIS — Z7901 Long term (current) use of anticoagulants: Secondary | ICD-10-CM | POA: Insufficient documentation

## 2022-04-18 DIAGNOSIS — R42 Dizziness and giddiness: Secondary | ICD-10-CM | POA: Diagnosis present

## 2022-04-18 DIAGNOSIS — R69 Illness, unspecified: Secondary | ICD-10-CM

## 2022-04-18 LAB — I-STAT CHEM 8, ED
BUN: 24 mg/dL — ABNORMAL HIGH (ref 8–23)
Calcium, Ion: 0.79 mmol/L — CL (ref 1.15–1.40)
Chloride: 106 mmol/L (ref 98–111)
Creatinine, Ser: 3.5 mg/dL — ABNORMAL HIGH (ref 0.44–1.00)
Glucose, Bld: 95 mg/dL (ref 70–99)
HCT: 24 % — ABNORMAL LOW (ref 36.0–46.0)
Hemoglobin: 8.2 g/dL — ABNORMAL LOW (ref 12.0–15.0)
Potassium: 2.5 mmol/L — CL (ref 3.5–5.1)
Sodium: 141 mmol/L (ref 135–145)
TCO2: 20 mmol/L — ABNORMAL LOW (ref 22–32)

## 2022-04-18 LAB — PROTIME-INR
INR: 1.4 — ABNORMAL HIGH (ref 0.8–1.2)
Prothrombin Time: 16.7 seconds — ABNORMAL HIGH (ref 11.4–15.2)

## 2022-04-18 LAB — CBC
HCT: 25.9 % — ABNORMAL LOW (ref 36.0–46.0)
Hemoglobin: 8 g/dL — ABNORMAL LOW (ref 12.0–15.0)
MCH: 28.4 pg (ref 26.0–34.0)
MCHC: 30.9 g/dL (ref 30.0–36.0)
MCV: 91.8 fL (ref 80.0–100.0)
Platelets: 84 10*3/uL — ABNORMAL LOW (ref 150–400)
RBC: 2.82 MIL/uL — ABNORMAL LOW (ref 3.87–5.11)
RDW: 14.6 % (ref 11.5–15.5)
WBC: 3.6 10*3/uL — ABNORMAL LOW (ref 4.0–10.5)
nRBC: 0 % (ref 0.0–0.2)

## 2022-04-18 LAB — BASIC METABOLIC PANEL
Anion gap: 11 (ref 5–15)
Anion gap: 13 (ref 5–15)
BUN: 27 mg/dL — ABNORMAL HIGH (ref 8–23)
BUN: 35 mg/dL — ABNORMAL HIGH (ref 8–23)
CO2: 19 mmol/L — ABNORMAL LOW (ref 22–32)
CO2: 22 mmol/L (ref 22–32)
Calcium: 5.8 mg/dL — CL (ref 8.9–10.3)
Calcium: 7.3 mg/dL — ABNORMAL LOW (ref 8.9–10.3)
Chloride: 108 mmol/L (ref 98–111)
Chloride: 100 mmol/L (ref 98–111)
Creatinine, Ser: 3.39 mg/dL — ABNORMAL HIGH (ref 0.44–1.00)
Creatinine, Ser: 4.69 mg/dL — ABNORMAL HIGH (ref 0.44–1.00)
GFR, Estimated: 14 mL/min — ABNORMAL LOW (ref >60)
GFR, Estimated: 10 mL/min — ABNORMAL LOW (ref >60)
Glucose, Bld: 99 mg/dL (ref 70–99)
Glucose, Bld: 106 mg/dL — ABNORMAL HIGH (ref 70–99)
Potassium: 2.5 mmol/L — CL (ref 3.5–5.1)
Potassium: 5.2 mmol/L — ABNORMAL HIGH (ref 3.5–5.1)
Sodium: 138 mmol/L (ref 135–145)
Sodium: 135 mmol/L (ref 135–145)

## 2022-04-18 LAB — MAGNESIUM: Magnesium: 2.5 mg/dL — ABNORMAL HIGH (ref 1.7–2.4)

## 2022-04-18 LAB — PHOSPHORUS: Phosphorus: 3 mg/dL (ref 2.5–4.6)

## 2022-04-18 MED ORDER — ETOMIDATE 2 MG/ML IV SOLN
8.0000 mg | Freq: Once | INTRAVENOUS | Status: AC
  Administered 2022-04-18: 8 mg via INTRAVENOUS

## 2022-04-18 MED ORDER — LORAZEPAM 2 MG/ML IJ SOLN
INTRAMUSCULAR | Status: AC
  Administered 2022-04-18: 0.5 mg
  Filled 2022-04-18: qty 1

## 2022-04-18 MED ORDER — MIDAZOLAM HCL 2 MG/2ML IJ SOLN
INTRAMUSCULAR | Status: AC
  Filled 2022-04-18: qty 2

## 2022-04-18 MED ORDER — METOPROLOL TARTRATE 5 MG/5ML IV SOLN
INTRAVENOUS | Status: AC
  Administered 2022-04-18: 5 mg via INTRAVENOUS
  Filled 2022-04-18: qty 5

## 2022-04-18 MED ORDER — METOPROLOL TARTRATE 5 MG/5ML IV SOLN
2.5000 mg | Freq: Once | INTRAVENOUS | Status: AC
  Administered 2022-04-18: 2.5 mg via INTRAVENOUS

## 2022-04-18 MED ORDER — MAGNESIUM SULFATE 2 GM/50ML IV SOLN
2.0000 g | Freq: Once | INTRAVENOUS | Status: AC
  Administered 2022-04-18: 2 g via INTRAVENOUS
  Filled 2022-04-18: qty 50

## 2022-04-18 MED ORDER — POTASSIUM CHLORIDE 10 MEQ/100ML IV SOLN
10.0000 meq | INTRAVENOUS | Status: AC
  Administered 2022-04-18 (×2): 10 meq via INTRAVENOUS
  Filled 2022-04-18 (×3): qty 100

## 2022-04-18 NOTE — ED Triage Notes (Addendum)
Pt BIBGEMS for AFIB RVR rate of 130-170 from [facility] where she was 1.5 hrs in for tx. EMS bolus 20 mg Cardizem, pt converted for about 30 seconds and then went right back into afib rvr at 170 bpm. Pt alert and oriented, but very tearful and in pain.  117/60 1440 BPM

## 2022-04-18 NOTE — ED Notes (Signed)
Pt cardioverted by Dr. Peter Congo at 75 joules.

## 2022-04-18 NOTE — ED Provider Notes (Signed)
Patient signed out to me at 1500 by Dr. Vallery Ridge pending labs and reassessment.  In short this is a 67 year old female with a past medical history of ESRD and A-fib on Eliquis that presented to the emergency department from dialysis center with palpitations.  She was initially found to be in A-fib with RVR and was cardioverted prior to signout.  Upon my initial evaluation, the patient is still mildly drowsy from her sedation.  Initial BMP showed a potassium of 2.5 as well as a calcium of 5.8.  I am concerned for possible hemodilution and will have repeat BMP performed.  Her potassium infusion was held.  Clinical Course as of 04/18/22 1917  Thu Apr 18, 2022  1745 Patient is awake, alert and ambulating steadily. Clinically appears back to baseline after sedation. Rpt BMP in process at this time. [VK]  1844 Potassium is normal on Rpt BMP. Potasisum infusion was stopped prior to completion. Patient remains in sinus rhythm and is stable for discharge with cardiology follow up. [VK]    Clinical Course User Index [VK] Kemper Durie, DO      Kemper Durie, [place] 04/18/22 [phone]

## 2022-04-18 NOTE — ED Notes (Signed)
Provider notified that the patient is ready to leave the hospital as the patient is requesting to be discharged

## 2022-04-18 NOTE — ED Provider Notes (Signed)
[facility], [place] EMERGENCY DEPARTMENT Provider Note   CSN: [id number] Arrival date & time: 04/18/22  1409     History  Chief Complaint  Patient presents with   Tachycardia    Alicia [name] is a 67 y.o. female.  HPI Patient was at her dialysis session for an hour and a half when her heart suddenly started racing.  Patient reports she feels very uncomfortable with this.  She can feel her heart racing and is having chest pain.  She feels lightheaded.  EMS arrived at the dialysis center where patient's heart rates were ranging from 1 30-1 70.  She was given a 20 mg bolus of Cardizem.  She converted for about 30 seconds and then went back into the same rhythm with heart rates up to 170.  Patient reports she felt well before she went to dialysis.  She was not having chest pains or shortness of breath.  She takes Eliquis.  She has taken her Eliquis dose as prescribed this morning.  Ports she is consistent and does not miss doses.    Home Medications Prior to Admission medications   Medication Sig Start Date End Date Taking? Authorizing Provider  acetaminophen (TYLENOL) 500 MG tablet Take 1,000 mg by mouth every 6 (six) hours as needed for moderate pain or headache.    [provider]  amLODipine (NORVASC) 10 MG tablet Take 1 tablet (10 mg total) by mouth every evening. 03/20/22   Linus Galas, MD  apixaban (ELIQUIS) 2.5 MG TABS tablet Take 1 tablet (2.5 mg total) by mouth 2 (two) times daily. 12/14/21   Janina Mayo, MD  aspirin-acetaminophen-caffeine (EXCEDRIN MIGRAINE) [phone] MG tablet Take 2 tablets by mouth every 6 (six) hours as needed for headache.    [provider]  Azelastine HCl 137 MCG/SPRAY SOLN Place 2 sprays into both nostrils as needed. 03/13/22   [provider]  calcitRIOL (ROCALTROL) 0.5 MCG capsule Take 0.5 mcg by mouth Every Tuesday,Thursday,and Saturday with dialysis.    [provider]  Calcium Acetate 667  MG TABS Take 1,334 mg by mouth 3 (three) times daily with meals.    [provider]  calcium carbonate (TUMS - DOSED IN MG ELEMENTAL CALCIUM) 500 MG chewable tablet Chew 2 tablets by mouth 3 (three) times daily as needed for indigestion or heartburn.    [provider]  diltiazem (CARDIZEM) 30 MG tablet Take 1 tablet every 4 hours AS NEEDED for AFIB heart rate >100 as long as top BP >100. 03/27/22   Fenton, Clint R, PA  gabapentin (NEURONTIN) 100 MG capsule Take 200 mg by mouth See admin instructions. Take 200 mg by mouth before dialysis on Tues/Thurs/Sat 05/23/21   [provider]  guaiFENesin (MUCINEX) 600 MG 12 hr tablet Take 600 mg by mouth 2 (two) times daily as needed for to loosen phlegm. 03/13/22 03/13/23  [provider]  hydrALAZINE (APRESOLINE) 100 MG tablet Take 100 mg by mouth 2 (two) times daily. 05/16/20   [provider]  lidocaine-prilocaine (EMLA) cream Apply 1 Application topically as needed (dialysis).    [provider]  loperamide (IMODIUM) 2 MG capsule Take 4 mg by mouth daily as needed for diarrhea or loose stools.    [provider]  metoprolol tartrate (LOPRESSOR) 50 MG tablet Take 1 tablet (50 mg total) by mouth 2 (two) times daily. 03/20/22   Linus Galas, MD  Phenylephrine-APAP-guaiFENesin (SINUS CONGESTION/PAIN DAYTIME) 5-325-200 MG TABS Take 1 tablet by mouth daily as  needed (sinus issues.).    [provider]  PRILOSEC OTC 20 MG tablet Take 20 mg by mouth daily as needed (for heartburn or reflux).    [provider]  sodium chloride (OCEAN) 0.65 % SOLN nasal spray Place 1 spray into both nostrils as needed for congestion.    [provider]      Allergies    Penicillins, Antivert [meclizine], Compazine [prochlorperazine edisylate], and Flexeril [cyclobenzaprine]    Review of Systems   Review of Systems  Physical Exam Updated Vital Signs BP (!) 171/100   Pulse 78    Resp (!) 21   Ht 5' (1.524 m)   SpO2 100%   BMI 33.16 kg/m  Physical Exam Constitutional:      Comments: Patient is alert.  She does not have respiratory distress.  She is however very uncomfortable in appearance.  She is hyperventilating slightly.  She is anxious.  Mental status is clear.  HENT:     Mouth/Throat:     Pharynx: Oropharynx is clear.  Eyes:     Extraocular Movements: Extraocular movements intact.  Cardiovascular:     Comments: Tachycardia irregularly irregular. Pulmonary:     Comments: Tachypneic but lung fields are clear. Abdominal:     General: There is no distension.     Palpations: Abdomen is soft.     Tenderness: There is no abdominal tenderness. There is no guarding.  Musculoskeletal:     Comments: No significant peripheral edema.  Calves soft nontender.  Feet warm and dry.  Skin:    General: Skin is warm and dry.  Neurological:     General: No focal deficit present.     Mental Status: She is oriented to person, place, and time.  Psychiatric:     Comments: Patient is very anxious and uncomfortable.     ED Results / Procedures / Treatments   Labs (all labs ordered are listed, but only abnormal results are displayed) Labs Reviewed  BASIC METABOLIC PANEL - Abnormal; Notable for the following components:      Result Value   Potassium 2.5 (*)    CO2 19 (*)    BUN 27 (*)    Creatinine, Ser 3.39 (*)    Calcium 5.8 (*)    GFR, Estimated 14 (*)    All other components within normal limits  CBC - Abnormal; Notable for the following components:   WBC 3.6 (*)    RBC 2.82 (*)    Hemoglobin 8.0 (*)    HCT 25.9 (*)    Platelets 84 (*)    All other components within normal limits  PROTIME-INR - Abnormal; Notable for the following components:   Prothrombin Time 16.7 (*)    INR 1.4 (*)    All other components within normal limits  I-STAT CHEM 8, ED - Abnormal; Notable for the following components:   Potassium 2.5 (*)    BUN 24 (*)    Creatinine, Ser 3.50  (*)    Calcium, Ion 0.79 (*)    TCO2 20 (*)    Hemoglobin 8.2 (*)    HCT 24.0 (*)    All other components within normal limits  MAGNESIUM  PHOSPHORUS  BASIC METABOLIC PANEL    EKG EKG Interpretation  Date/Time:  Thursday April 18 2022 15:22:08 EST Ventricular Rate:  132 PR Interval:  139 QRS Duration: 88 QT Interval:  425 QTC Calculation: 526 R Axis:   83 Text Interpretation: Sinus tachycardia Ventricular tachycardia, unsustained Probable left atrial  enlargement Anterior infarct, old Prolonged QT interval No significant change since last tracing today Confirmed by Leanord Asal ([id number]) on 04/18/2022 4:01:50 PM  Radiology No results found.  Procedures .Cardioversion  Date/Time: 04/18/2022 4:15 PM  Performed by: Charlesetta Shanks, MD Authorized by: Charlesetta Shanks, MD   Consent:    Consent obtained:  Verbal   Consent given by:  Patient   Risks discussed:  Death, induced arrhythmia and pain Pre-procedure details:    Cardioversion basis:  Emergent   Rhythm:  Atrial fibrillation   Electrode placement:  Anterior-posterior Patient sedated: Yes. Refer to sedation procedure documentation for details of sedation.  Attempt one:    Cardioversion mode:  Synchronous   Shock (Joules):  75   Shock outcome:  Conversion to normal sinus rhythm Post-procedure details:    Patient status:  Awake   Patient tolerance of procedure:  Tolerated well, no immediate complications Comments:     Cardioversion well.  He did convert to sinus rhythm but had episodes of tachycardia and PVCs.  .Sedation  Date/Time: 04/18/2022 4:16 PM  Performed by: Charlesetta Shanks, MD Authorized by: Charlesetta Shanks, MD   Consent:    Consent obtained:  Verbal   Consent given by:  Patient   Risks discussed:  Allergic reaction, dysrhythmia, prolonged sedation necessitating reversal, respiratory compromise necessitating ventilatory assistance and intubation and inadequate sedation Universal protocol:     Immediately prior to procedure, a time out was called: yes   Indications:    Procedure performed:  Cardioversion   Procedure necessitating sedation performed by:  Physician performing sedation Pre-sedation assessment:    Time since last food or drink:  Unknown   NPO status caution: unable to specify NPO status     ASA classification: class 4 - patient with severe systemic disease that is a constant threat to life     Mallampati score:  II - soft palate, uvula, fauces visible   Pre-sedation assessments completed and reviewed: airway patency, cardiovascular function, hydration status, mental status, nausea/vomiting, pain level and respiratory function   Immediate pre-procedure details:    Reassessment: Patient reassessed immediately prior to procedure   Procedure details (see MAR for exact dosages):    Sedation:  Etomidate   Intended level of sedation: deep   Intra-procedure events: none     Total Provider sedation time (minutes):  20 Post-procedure details:    Procedure completion:  Tolerated well, no immediate complications Patient tolerated   Medications Ordered in ED Medications  midazolam (VERSED) 2 MG/2ML injection (  Not Given 04/18/22 1456)  potassium chloride 10 mEq in 100 mL IVPB (10 mEq Intravenous New Bag/Given 04/18/22 1559)  metoprolol tartrate (LOPRESSOR) 5 MG/5ML injection (5 mg Intravenous Given 04/18/22 1458)  LORazepam (ATIVAN) 2 MG/ML injection (0.5 mg  Given 04/18/22 1503)  etomidate (AMIDATE) injection 8 mg (8 mg Intravenous Given 04/18/22 1454)  magnesium sulfate IVPB 2 g 50 mL (0 g Intravenous Stopped 04/18/22 1557)  metoprolol tartrate (LOPRESSOR) injection 2.5 mg (2.5 mg Intravenous Given 04/18/22 1524)    ED Course/ Medical Decision Making/ A&P                           Medical Decision Making Amount and/or Complexity of Data Reviewed Labs: ordered.  Risk Prescription drug management.   Became dialysis with rapid atrial fibrillation.  She was symptomatic  with chest pain and an episode of hypotension to 88.  At that point considered unstable A-fib.  Decision made for  cardioversion.  Patient has had 20 mg of Cardizem by EMS without improvement.  Patient was assessed for stability.  She is alert with clear mental status.  Airway is patent.  Patient is adequately anticoagulated.  She is compliant with Eliquis.  Patient paired for cardioversion and successfully cardioverted with 1 shock.  Rhythm went to sinus rhythm but with some frequent PACs and small episodes of paroxysmal A-fib.  Patient was given Lopressor 2.5 mg in 2 increments.  Also her potassium was at 2.5 which is likely contributing to episode of A-fib.  Will start potassium replacement.  Magnesium also ordered.  Patient returned to baseline mental status.  She is improved and now rhythm has been more consistently sinus and rate controlled in the 70s and 80s.  At shift change, Dr. Maylon Peppers will assume care to follow-up on remainder of diagnostic results and make disposition based on patient's clinical condition and response to treatment.        Final Clinical Impression(s) / ED Diagnoses Final diagnoses:  Atrial fibrillation with RVR (Prue)  Severe comorbid illness    Rx / DC Orders ED Discharge Orders     None         Charlesetta Shanks, MD 04/18/22 1621

## 2022-04-18 NOTE — ED Notes (Addendum)
Ambulates to the restroom at this time without assistance. Patient reports that she will be calling family on the room phone and verbalizes understanding on how to do so. Patient also reports awareness of the call bell in the restroom and when to use it

## 2022-04-18 NOTE — ED Notes (Signed)
IV team is at bedside and verbalizes awareness that the patient will need another lab draw

## 2022-04-18 NOTE — ED Notes (Signed)
Provider at bedside

## 2022-04-18 NOTE — ED Notes (Signed)
Lab results was reported to Nurse. 

## 2022-04-18 NOTE — ED Notes (Signed)
Provider notified that the patient is having a difficult time tolerating IV potassium. This RN slows down rate at this time

## 2022-04-18 NOTE — ED Notes (Signed)
Patient provided with falls risk and call bell education at this time and verbalizes understanding

## 2022-04-18 NOTE — ED Notes (Signed)
XR at bedside

## 2022-04-18 NOTE — ED Notes (Signed)
Per Dr Maylon Peppers; pause the potassium until repeat labs result

## 2022-04-18 NOTE — Discharge Instructions (Addendum)
You were seen in the emergency department for heart palpitations.  You were found to be in A-fib with a rapid heart rate.  He performed a cardioversion which is a shock to put your heart back into a normal rhythm and you have maintained in a normal rhythm since you have been here.  Your labs on repeat showed no significant abnormality.  You can follow-up with your primary doctor and your cardiologist within the next few days to have your symptoms rechecked.  You should continue to take your home medications as prescribed.  You should return to the emergency department if you feel that your heart is back into a rapid rhythm, you have severe chest pain or shortness of breath or if you have any other new or concerning symptoms.

## 2022-04-18 NOTE — ED Notes (Signed)
IV team reports they couldn't get a second IV and have reported such to the provider.

## 2022-04-24 ENCOUNTER — Ambulatory Visit (HOSPITAL_COMMUNITY)
Admission: RE | Admit: 2022-04-24 | Discharge: 2022-04-24 | Disposition: A | Discharge status: Home / Self Care | Payer: Medicare (Managed Care) | Source: Ambulatory Visit | Attending: Physician Assistant | Admitting: Physician Assistant

## 2022-04-24 DIAGNOSIS — K7031 Alcoholic cirrhosis of liver with ascites: Secondary | ICD-10-CM | POA: Diagnosis present

## 2022-04-24 HISTORY — PX: IR PARACENTESIS: IMG2679

## 2022-04-24 MED ORDER — LIDOCAINE HCL 1 % IJ SOLN
INTRAMUSCULAR | Status: AC
  Filled 2022-04-24: qty 20

## 2022-04-24 NOTE — Procedures (Signed)
PROCEDURE SUMMARY:  Successful ultrasound guided paracentesis from the right lateral abdomen.  Yielded 1.3 liters of clear, yellow fluid.  No immediate complications.  The patient tolerated the procedure well.   Specimen was sent for labs.  EBL < 23m  The patient has required >/=2 paracenteses in a 30 day period and [facility].   KDocia Barrier

## 2022-05-09 DEATH — deceased

## 2022-06-07 ENCOUNTER — Ambulatory Visit: Payer: Medicare (Managed Care) | Admitting: Internal Medicine
